# Patient Record
Sex: Male | Born: 1965
Health system: Southern US, Community
[De-identification: ages and names within clinical notes are randomized; demographics above are authoritative.]

## PROBLEM LIST (undated history)

## (undated) DIAGNOSIS — K0889 Other specified disorders of teeth and supporting structures: Secondary | ICD-10-CM

## (undated) DIAGNOSIS — M545 Low back pain, unspecified: Secondary | ICD-10-CM

## (undated) DIAGNOSIS — R911 Solitary pulmonary nodule: Secondary | ICD-10-CM

## (undated) DIAGNOSIS — J449 Chronic obstructive pulmonary disease, unspecified: Secondary | ICD-10-CM

## (undated) DIAGNOSIS — G4700 Insomnia, unspecified: Secondary | ICD-10-CM

## (undated) DIAGNOSIS — J189 Pneumonia, unspecified organism: Secondary | ICD-10-CM

## (undated) DIAGNOSIS — E7800 Pure hypercholesterolemia, unspecified: Secondary | ICD-10-CM

## (undated) DIAGNOSIS — R0602 Shortness of breath: Secondary | ICD-10-CM

## (undated) DIAGNOSIS — M549 Dorsalgia, unspecified: Secondary | ICD-10-CM

## (undated) DIAGNOSIS — R059 Cough, unspecified: Secondary | ICD-10-CM

## (undated) DIAGNOSIS — Z9889 Other specified postprocedural states: Secondary | ICD-10-CM

## (undated) DIAGNOSIS — K4090 Unilateral inguinal hernia, without obstruction or gangrene, not specified as recurrent: Secondary | ICD-10-CM

## (undated) DIAGNOSIS — F17200 Nicotine dependence, unspecified, uncomplicated: Secondary | ICD-10-CM

## (undated) DIAGNOSIS — E46 Unspecified protein-calorie malnutrition: Secondary | ICD-10-CM

## (undated) DIAGNOSIS — F419 Anxiety disorder, unspecified: Secondary | ICD-10-CM

## (undated) DIAGNOSIS — R05 Cough: Secondary | ICD-10-CM

## (undated) DIAGNOSIS — R0600 Dyspnea, unspecified: Secondary | ICD-10-CM

## (undated) DIAGNOSIS — K219 Gastro-esophageal reflux disease without esophagitis: Secondary | ICD-10-CM

## (undated) HISTORY — DX: Chronic obstructive pulmonary disease, unspecified: J44.9

## (undated) HISTORY — PX: CHOLECYSTECTOMY: SHX55

## (undated) HISTORY — PX: KNEE SURGERY: SHX244

## (undated) HISTORY — DX: Insomnia, unspecified: G47.00

## (undated) HISTORY — PX: APPENDECTOMY: SHX54

## (undated) HISTORY — DX: Other specified disorders of teeth and supporting structures: K08.89

## (undated) HISTORY — DX: Anxiety disorder, unspecified: F41.9

## (undated) HISTORY — PX: COLONOSCOPY: SHX174

## (undated) HISTORY — PX: HERNIA REPAIR: SHX51

## (undated) HISTORY — DX: Unspecified protein-calorie malnutrition: E46

## (undated) HISTORY — DX: Nicotine dependence, unspecified, uncomplicated: F17.200

## (undated) HISTORY — DX: Other specified postprocedural states: Z98.890

## (undated) HISTORY — PX: OTHER SURGICAL HISTORY: SHX169

## (undated) HISTORY — DX: Pure hypercholesterolemia, unspecified: E78.00

## (undated) HISTORY — DX: Unilateral inguinal hernia, without obstruction or gangrene, not specified as recurrent: K40.90

## (undated) HISTORY — DX: Low back pain, unspecified: M54.50

## (undated) HISTORY — DX: Shortness of breath: R06.02

## (undated) HISTORY — DX: Cough, unspecified: R05.9

---
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
## 2001-07-26 ENCOUNTER — Emergency Department (HOSPITAL_COMMUNITY): Admission: EM | Admit: 2001-07-26 | Discharge: 2001-07-26 | Payer: Self-pay | Admitting: Emergency Medicine

## 2003-01-29 ENCOUNTER — Emergency Department (HOSPITAL_COMMUNITY): Admission: EM | Admit: 2003-01-29 | Discharge: 2003-01-29 | Payer: Self-pay | Admitting: Emergency Medicine

## 2003-02-19 ENCOUNTER — Emergency Department (HOSPITAL_COMMUNITY): Admission: EM | Admit: 2003-02-19 | Discharge: 2003-02-19 | Payer: Self-pay | Admitting: *Deleted

## 2003-11-17 ENCOUNTER — Ambulatory Visit (HOSPITAL_COMMUNITY): Admission: RE | Admit: 2003-11-17 | Discharge: 2003-11-17 | Payer: Self-pay | Admitting: Internal Medicine

## 2003-12-23 ENCOUNTER — Observation Stay (HOSPITAL_COMMUNITY): Admission: RE | Admit: 2003-12-23 | Discharge: 2003-12-24 | Payer: Self-pay | Admitting: General Surgery

## 2004-12-14 ENCOUNTER — Emergency Department (HOSPITAL_COMMUNITY): Admission: EM | Admit: 2004-12-14 | Discharge: 2004-12-14 | Payer: Self-pay | Admitting: Emergency Medicine

## 2008-07-29 ENCOUNTER — Emergency Department (HOSPITAL_COMMUNITY): Admission: EM | Admit: 2008-07-29 | Discharge: 2008-07-29 | Payer: Self-pay | Admitting: Emergency Medicine

## 2009-01-14 ENCOUNTER — Emergency Department (HOSPITAL_COMMUNITY): Admission: EM | Admit: 2009-01-14 | Discharge: 2009-01-14 | Payer: Self-pay | Admitting: Emergency Medicine

## 2009-06-15 ENCOUNTER — Emergency Department (HOSPITAL_COMMUNITY): Admission: EM | Admit: 2009-06-15 | Discharge: 2009-06-15 | Payer: Self-pay | Admitting: Emergency Medicine

## 2010-04-08 DIAGNOSIS — Z9889 Other specified postprocedural states: Secondary | ICD-10-CM

## 2010-04-08 HISTORY — DX: Other specified postprocedural states: Z98.890

## 2010-04-21 ENCOUNTER — Encounter: Payer: Self-pay | Admitting: Internal Medicine

## 2010-04-21 ENCOUNTER — Ambulatory Visit (INDEPENDENT_AMBULATORY_CARE_PROVIDER_SITE_OTHER): Payer: Self-pay | Admitting: Gastroenterology

## 2010-04-21 ENCOUNTER — Encounter: Payer: Self-pay | Admitting: Gastroenterology

## 2010-04-21 DIAGNOSIS — Z1211 Encounter for screening for malignant neoplasm of colon: Secondary | ICD-10-CM

## 2010-04-21 DIAGNOSIS — K625 Hemorrhage of anus and rectum: Secondary | ICD-10-CM

## 2010-04-21 DIAGNOSIS — R1013 Epigastric pain: Secondary | ICD-10-CM

## 2010-04-21 HISTORY — DX: Hemorrhage of anus and rectum: K62.5

## 2010-04-22 ENCOUNTER — Encounter: Payer: Self-pay | Admitting: Gastroenterology

## 2010-04-26 ENCOUNTER — Telehealth (INDEPENDENT_AMBULATORY_CARE_PROVIDER_SITE_OTHER): Payer: Self-pay | Admitting: *Deleted

## 2010-04-26 ENCOUNTER — Other Ambulatory Visit: Payer: Self-pay | Admitting: Gastroenterology

## 2010-04-26 ENCOUNTER — Other Ambulatory Visit (HOSPITAL_COMMUNITY): Payer: Self-pay | Admitting: *Deleted

## 2010-04-26 DIAGNOSIS — R0602 Shortness of breath: Secondary | ICD-10-CM

## 2010-04-27 ENCOUNTER — Telehealth: Payer: Self-pay | Admitting: Gastroenterology

## 2010-04-27 NOTE — Letter (Signed)
Summary: TCS/EGD ORDER  TCS/EGD ORDER   Imported By: Ave Filter 04/21/2010 12:11:39  _____________________________________________________________________  External Attachment:    Type:   Image     Comment:   External Document

## 2010-04-27 NOTE — Telephone Encounter (Signed)
Message copied by Gerrit Halls on Tue Apr 27, 2010 11:23 AM ------      Message from: Ervin Knack      Created: Tue Apr 27, 2010 10:38 AM      Regarding: PATIENT WANTS DIFFERENT PAIN MED      Contact: [phone]       Pain medicine that he has been taking is not working(hydrocodone 5/500) wants different pain medication or stronger. He has already taken all of this medication and he cant tell any difference. Uses       [facility] [employer].

## 2010-04-27 NOTE — Telephone Encounter (Signed)
Pt given short course of vicodin at office visit April 21, 2010. He is scheduled for a procedure tomorrow. We will not be able to give any more narcotics. If he is having severe pain, N/V, abdominal pain, he will need to seek medical attention.

## 2010-04-27 NOTE — Assessment & Plan Note (Signed)
Summary: epigastric pain,positive H pylori   Vital Signs:  Patient profile:   45 year old male Height:      68 inches Weight:      152 pounds BMI:     23.20 Temp:     97.8 degrees F oral Pulse rate:   84 / minute BP sitting:   124 / 92  (left arm)  Vitals Entered By: Carolan Clines LPN (April 21, 2010 11:25 AM)  Visit Type:  Initial Consult [facility] Provider[facility]   History of Present Illness: Pt presents today in consult regarding several month hx of epigastric pain. + serum H.pylori. completed abx therapy. c/o sharp pains. No aggravating or alleviating factors. Constant. No nausea currently. Remote hx of vomiting. No dsyphagia/odynophagia. No reflux. +melena. Several months ago had blood in stool. Denies constipation. Has BM3-4X per day. Denies diarrhea. No loss of appetite. No wt loss.  Rarely takes Aleve. maybe one every few months. Denies BC, Goodys, Aspirin. +smoker, 1/2ppd.   Labs 03/22/10: Amylase/Lipase WNL                        LFTs WNL  Current Medications (verified): 1)  Advair Diskus 250-50 Mcg/dose Aepb (Fluticasone-Salmeterol) .... Two Times A Day 2)  Ventolin Hfa 108 (90 Base) Mcg/act Aers (Albuterol Sulfate) .... Two Times A Day 3)  Ranitidine Hcl 300 Mg Caps (Ranitidine Hcl) .... Once Daily 4)  Loratadine 10 Mg Tabs (Loratadine) .... Once Daily 5)  Pravastatin Sodium 20 Mg Tabs (Pravastatin Sodium) .... Once Daily 6)  Omeprazole 20 Mg Cpdr (Omeprazole) .... Two Times A Day  Allergies (verified): No Known Drug Allergies  Past History:  Past Medical History: Hypercholesterolemia COPD  Past Surgical History: Cholecystectomy Appendectomy Right inguinal hernia repair  Family History: Mother:living, non-contributory Father:deceased, ETOH cirrhosis Aunt: deceased stomach cancer No FH of Colon Cancer:  Social History: Current smoker: 1/2ppd Alcohol Use - yes, beers occasionally. denies daily use Illicit Drug  Use - no Drug Use:  no  Review of Systems General:  Denies fever, chills, and anorexia. Eyes:  Denies blurring, irritation, and discharge. ENT:  Denies sore throat, hoarseness, and difficulty swallowing. CV:  Denies chest pains and syncope. Resp:  Denies dyspnea at rest and wheezing. GI:  See HPI. GU:  Denies urinary burning and urinary frequency. MS:  Denies joint pain / LOM, joint swelling, and joint stiffness. Derm:  Denies rash, itching, and dry skin. Neuro:  Denies weakness and syncope. Psych:  Denies depression and anxiety. Endo:  Denies cold intolerance and heat intolerance.  Physical Exam  General:  Well developed, well nourished, no acute distress. Head:  Normocephalic and atraumatic. Eyes:  sclera without icterus Mouth:  No deformity or lesions, dentition normal. Lungs:  CTAB Heart:  Regular rate and rhythm; no murmurs, rubs,  or bruits. Abdomen:  soft, +BS, tender to palpation LUQ, epigastric area. no rebound or guarding. no HSM Msk:  Symmetrical with no gross deformities. Normal posture. Extremities:  No clubbing, cyanosis, edema or deformities noted. Neurologic:  Alert and  oriented x4;  grossly normal neurologically. Skin:  Intact without significant lesions or rashes. Psych:  Alert and cooperative. Normal mood and affect.   Impression & Recommendations:  Problem # 1:  ABDOMINAL PAIN-EPIGASTRIC (ICD-93.64)  45 year old male with several month hx of epigastric pain, positive serum H.pylori, s/p abx therapy. Continues to c/o sharp pains without any aggravating or alleviating factors. Constant. Denies nausea,  dysphagia, odynophagia. Denies reflux. Has noted melena. One incidence of brbpr. No loss of appetite or wt loss. Rare Aleve every few months, denies BC, Goodys. At time of interview, pt did not know current medications he was taking. Once retrieved from pharmacy, noted he was taking meloxicam. Will need to verify with pt if he is indeed still taking. Will need  EGD to assess chronic epigastric pain, likely biopsy for H.pylori. LFTs, Amylase, Lipase WNL. pt requested pain medication. very short course given, with instructions that if pain worsen in duration, severity, associated N/V, abdominal distension, to present to ED.   EGD/TCS with Dr. Jena Gauss in near future: the R/B/A have been discussed in detail. Pt states understanding and has no further questions. AVOID NSAIDs Prilosec 20 mg by mouth 30 minutes before breakfast daily. Samples given, rx sent to pharmacy  Orders: Consultation Level III [phone])  Problem # 2:  RECTAL BLEEDING (ICD-569.3)  Melanotic stools, likely r/t UGI source; however, did not incidence of brbpr. 3-4BMs per day. No diarrhea. Will proceed with colonoscopy at time of EGD. Likely hematochezia secondary to benign source.   TCS with Dr. Jena Gauss at time of EGD. See # 1.   Orders: Consultation Level III (42595) Prescriptions: VICODIN 5-500 MG TABS (HYDROCODONE-ACETAMINOPHEN) take 1 by mouth every 6 hours as needed pain  #20 x 0   Entered and Authorized by:   Gerrit Halls NP   Signed by:   Gerrit Halls NP on 04/21/2010   Method used:   Print then Give to Patient   RxID:   [id number]    Orders Added: 1)  Consultation Level III [16606]

## 2010-04-27 NOTE — Telephone Encounter (Signed)
Spoke with pts girlfriend and informed her of Anna's recommendations. She verbalized understanding.

## 2010-04-28 ENCOUNTER — Ambulatory Visit (HOSPITAL_COMMUNITY)
Admission: RE | Admit: 2010-04-28 | Discharge: 2010-04-28 | Disposition: A | Discharge status: Home / Self Care | Payer: Self-pay | Source: Ambulatory Visit | Attending: Internal Medicine | Admitting: Internal Medicine

## 2010-04-28 ENCOUNTER — Encounter: Payer: Self-pay | Admitting: Internal Medicine

## 2010-04-28 ENCOUNTER — Other Ambulatory Visit (HOSPITAL_COMMUNITY): Payer: Self-pay

## 2010-04-28 DIAGNOSIS — K625 Hemorrhage of anus and rectum: Secondary | ICD-10-CM

## 2010-04-28 DIAGNOSIS — J449 Chronic obstructive pulmonary disease, unspecified: Secondary | ICD-10-CM | POA: Insufficient documentation

## 2010-04-28 DIAGNOSIS — K449 Diaphragmatic hernia without obstruction or gangrene: Secondary | ICD-10-CM | POA: Insufficient documentation

## 2010-04-28 DIAGNOSIS — R1013 Epigastric pain: Secondary | ICD-10-CM | POA: Insufficient documentation

## 2010-04-28 DIAGNOSIS — K2100 Gastro-esophageal reflux disease with esophagitis, without bleeding: Secondary | ICD-10-CM | POA: Insufficient documentation

## 2010-04-28 DIAGNOSIS — J4489 Other specified chronic obstructive pulmonary disease: Secondary | ICD-10-CM | POA: Insufficient documentation

## 2010-04-28 DIAGNOSIS — R0602 Shortness of breath: Secondary | ICD-10-CM

## 2010-04-28 DIAGNOSIS — E785 Hyperlipidemia, unspecified: Secondary | ICD-10-CM | POA: Insufficient documentation

## 2010-04-28 LAB — CBC
HCT: 50.5 % (ref 39.0–52.0)
Hemoglobin: 16.7 g/dL (ref 13.0–17.0)
MCH: 30.2 pg (ref 26.0–34.0)
MCHC: 33.1 g/dL (ref 30.0–36.0)
MCV: 91.3 fL (ref 78.0–100.0)
Platelets: 279 10*3/uL (ref 150–400)
RBC: 5.53 MIL/uL (ref 4.22–5.81)
RDW: 14 % (ref 11.5–15.5)
WBC: 7.5 10*3/uL (ref 4.0–10.5)

## 2010-04-29 ENCOUNTER — Ambulatory Visit (HOSPITAL_COMMUNITY)
Admission: RE | Admit: 2010-04-29 | Discharge: 2010-04-29 | Disposition: A | Discharge status: Home / Self Care | Payer: Self-pay | Source: Ambulatory Visit | Attending: Neurology | Admitting: Neurology

## 2010-04-29 ENCOUNTER — Ambulatory Visit (HOSPITAL_COMMUNITY): Payer: Self-pay

## 2010-04-29 ENCOUNTER — Ambulatory Visit (HOSPITAL_COMMUNITY)
Admission: RE | Admit: 2010-04-29 | Discharge: 2010-04-29 | Disposition: A | Discharge status: Home / Self Care | Payer: Self-pay | Source: Ambulatory Visit | Attending: Gastroenterology | Admitting: Gastroenterology

## 2010-04-29 DIAGNOSIS — R1013 Epigastric pain: Secondary | ICD-10-CM | POA: Insufficient documentation

## 2010-04-29 LAB — BLOOD GAS, ARTERIAL
Acid-base deficit: 1.7 mmol/L (ref 0.0–2.0)
Bicarbonate: 23 mEq/L (ref 20.0–24.0)
FIO2: 0.21 %
O2 Saturation: 95.6 %
Patient temperature: 37
TCO2: 20.3 mmol/L (ref 0–100)
pCO2 arterial: 42.1 mmHg (ref 35.0–45.0)
pH, Arterial: 7.356 (ref 7.350–7.450)
pO2, Arterial: 73.4 mmHg — ABNORMAL LOW (ref 80.0–100.0)

## 2010-04-29 MED ORDER — IOHEXOL 300 MG/ML  SOLN
100.0000 mL | Freq: Once | INTRAMUSCULAR | Status: AC | PRN
  Administered 2010-04-29: 100 mL via INTRAVENOUS

## 2010-05-04 ENCOUNTER — Ambulatory Visit (HOSPITAL_COMMUNITY)
Admission: RE | Admit: 2010-05-04 | Discharge: 2010-05-04 | Disposition: A | Discharge status: Home / Self Care | Payer: Self-pay | Source: Ambulatory Visit | Attending: Neurology | Admitting: Neurology

## 2010-05-04 ENCOUNTER — Other Ambulatory Visit: Payer: Self-pay | Admitting: Gastroenterology

## 2010-05-04 DIAGNOSIS — R0989 Other specified symptoms and signs involving the circulatory and respiratory systems: Secondary | ICD-10-CM | POA: Insufficient documentation

## 2010-05-04 DIAGNOSIS — R0602 Shortness of breath: Secondary | ICD-10-CM

## 2010-05-04 DIAGNOSIS — R0609 Other forms of dyspnea: Secondary | ICD-10-CM | POA: Insufficient documentation

## 2010-05-04 NOTE — Op Note (Signed)
Cameron Villarreal, Cameron Villarreal                 ACCOUNT NO.:  [id number]  MEDICAL RECORD NO.:  [id number]           PATIENT TYPE:  O  LOCATION:  DAYP                          FACILITY:  [facility]  PHYSICIAN:  R. Roetta Sessions, M.D. DATE OF BIRTH:  04-Nov-1965  DATE OF PROCEDURE:  04/28/2010 DATE OF DISCHARGE:                              OPERATIVE REPORT   INDICATIONS FOR PROCEDURE:  A 45 year old gentleman with 1-month history of epigastric pain, positive H. pylori serologies, previously treated, regular NSAID use (meloxicam), 1 episode of painless blood per rectum, ongoing epigastric pain.  Amylase, lipase, LFTs came back normal.  He has had some transient dark stools as well.  EGD and colonoscopy are now being done.  Risks, benefits, limitations, alternatives, imponderables have been discussed, questions answered.  Please see the documentation for the medical record.  PROCEDURE NOTE:  O2 saturation, blood pressure, pulse, and respirations were monitored throughout the entirety of the procedure.  CONSCIOUS SEDATION:  Versed 5 mg IV, Demerol 125 mg IV in divided doses, Phenergan  12.5 mg slow IV push to augment conscious sedation. Cetacaine spray for topical pharyngeal anesthesia.  FINDINGS:  EGD:  Examination of the tubular esophagus revealed circumferential distal esophageal erosions within 5 mm of GE junction. There was no Barrett esophagus or other abnormalities.  EG Junction easily traversed. Stomach:  Gastric cavity was emptied and insufflated well with air. Thorough examination of the gastric mucosa including retroflexed proximal stomach esophagogastric junction demonstrated only a small hiatal hernia.  There was no ulcer infiltrating process or other abnormality.  Pylorus was patent, easily traversed.  Examination of the bulb second portion revealed no abnormalities.  THERAPEUTIC/DIAGNOSTIC MANEUVERS PERFORMED:  None.  The patient tolerated the procedure well and was prepared  for colonoscopy.  Digital rectal exam revealed no abnormalities.  Endoscopic findings:  Prep was adequate.  Colon:  Colonic mucosa was surveyed from the rectosigmoid junction through the left transverse right colon to the appendiceal orifice, ileocecal valve/cecum.  These structures were well seen and photographed for the record.  Terminal ileum was admitted to 10 cm.  From this level, scope was slowly and cautiously withdrawn.  All previously mentioned mucosal surfaces were again seen.  The colonic mucosa appeared normal as did terminal ileum mucosa.  The scope was pulled down into the rectum where thorough examination of the rectal mucosa including retroflexed view of the anal verge demonstrated no abnormalities.  The patient tolerated the procedure well.  Cecal withdrawal time 8 minutes.  IMPRESSION: 1. Circumferential distal esophageal erosions consistent with mild     erosive reflux esophagitis, small hiatal hernia, otherwise normal     stomach, D1 and D2.  Colonoscopy findings, normal rectum, colon,     and terminal ileum. 2. Cameron Villarreal symptoms are somewhat out of proportion to objective     findings and the workup thus far.  RECOMMENDATIONS: 1. Stop smoking. 2. Increase omeprazole to 20 mg orally twice daily. 3. Stop meloxicam and all nonsteroidals. 4. CBC today. 5. Proceed with a CT of the abdomen and pelvis with and without IV or     oral contrast  to further evaluate the epigastric pain in the     setting of negative EGD and laboratory evaluation thus far. 6. We have given prescription for Vicodin 5/500 mg, #20, 1 p.o. q.16     hours p.r.n. pain. 7. Further recommendations to follow.     Cameron Villarreal, M.D.     RMR/MEDQ  D:  04/28/2010  T:  04/28/2010  Job:  [id number]  cc:   [facility]  Electronically Signed by Lorrin Goodell M.D. on 05/04/2010 11:23:35 AM

## 2010-05-06 NOTE — Progress Notes (Signed)
  Phone Note Call from Patient   Caller: MICHELLE(GIRLFRIEND) Summary of Call: PAIN MEDICINE THAT HE HAS BEEN TAKING IS NOT WORKING(HYDROCODONE 5/500MG ) WANTS DIFFERENT PAIN MEDICATION OR STRONGER HE HAS ALREADY TAKEN ALL OF THIS MEDICATION AN HE CANT TELL ANY DIFFERENCE. USES [facility].  Initial call taken by: LEIGHANN     Appended Document:  PLease call pt & tell him to go to ER if severe pain Otherwise, AS can address tomorrow since she just saw him  Appended Document:  this was already taken care of by AS in [facility]

## 2010-05-11 LAB — DIFFERENTIAL
Basophils Absolute: 0.1 10*3/uL (ref 0.0–0.1)
Basophils Relative: 1 % (ref 0–1)
Eosinophils Absolute: 0.3 10*3/uL (ref 0.0–0.7)
Eosinophils Relative: 3 % (ref 0–5)
Lymphocytes Relative: 30 % (ref 12–46)
Lymphs Abs: 3.2 10*3/uL (ref 0.7–4.0)
Monocytes Absolute: 1 10*3/uL (ref 0.1–1.0)
Monocytes Relative: 10 % (ref 3–12)
Neutro Abs: 6 10*3/uL (ref 1.7–7.7)
Neutrophils Relative %: 57 % (ref 43–77)

## 2010-05-11 LAB — POCT CARDIAC MARKERS
CKMB, poc: <1 ng/mL — ABNORMAL LOW (ref 1.0–8.0)
Myoglobin, poc: 59.4 ng/mL (ref 12–200)
Troponin i, poc: <0.05 ng/mL (ref 0.00–0.09)

## 2010-05-11 LAB — CBC
HCT: 54.2 % — ABNORMAL HIGH (ref 39.0–52.0)
Hemoglobin: 17.8 g/dL — ABNORMAL HIGH (ref 13.0–17.0)
MCHC: 32.8 g/dL (ref 30.0–36.0)
MCV: 91.7 fL (ref 78.0–100.0)
Platelets: 311 10*3/uL (ref 150–400)
RBC: 5.91 MIL/uL — ABNORMAL HIGH (ref 4.22–5.81)
RDW: 14.1 % (ref 11.5–15.5)
WBC: 10.6 10*3/uL — ABNORMAL HIGH (ref 4.0–10.5)

## 2010-05-11 LAB — BASIC METABOLIC PANEL
BUN: 14 mg/dL (ref 6–23)
CO2: 28 mEq/L (ref 19–32)
Calcium: 9.7 mg/dL (ref 8.4–10.5)
Chloride: 100 mEq/L (ref 96–112)
Creatinine, Ser: 0.93 mg/dL (ref 0.4–1.5)
GFR calc Af Amer: >60 mL/min (ref >60)
GFR calc non Af Amer: >60 mL/min (ref >60)
Glucose, Bld: 110 mg/dL — ABNORMAL HIGH (ref 70–99)
Potassium: 3.9 mEq/L (ref 3.5–5.1)
Sodium: 135 mEq/L (ref 135–145)

## 2010-06-15 ENCOUNTER — Encounter: Payer: Self-pay | Admitting: Internal Medicine

## 2010-06-15 NOTE — Progress Notes (Signed)
Pt with defect in diaphram and fatty liver - nothing else( I note a recent chest CT ordered elsewhere ) indicateing a small chest nodule for which f/u ct recommneded (per ordering provider).  Please let pt know abd ct ok; how is epigastric pain now on  BID PPI; ofer f/u appt if needed

## 2010-06-16 NOTE — Progress Notes (Signed)
Pt is aware of OV for 06/17/10 @ 0930 with AS

## 2010-06-16 NOTE — Progress Notes (Signed)
Tried to call pt- LM with male for return call 

## 2010-06-16 NOTE — Progress Notes (Signed)
Pt aware, stated he was still having pain. Please schedule ov.

## 2010-06-17 ENCOUNTER — Ambulatory Visit (INDEPENDENT_AMBULATORY_CARE_PROVIDER_SITE_OTHER): Payer: Self-pay | Admitting: Gastroenterology

## 2010-06-17 ENCOUNTER — Encounter: Payer: Self-pay | Admitting: Gastroenterology

## 2010-06-17 DIAGNOSIS — R1012 Left upper quadrant pain: Secondary | ICD-10-CM

## 2010-06-17 DIAGNOSIS — R1013 Epigastric pain: Secondary | ICD-10-CM

## 2010-06-17 MED ORDER — DEXLANSOPRAZOLE 60 MG PO CPDR: 60.0000 mg | DELAYED_RELEASE_CAPSULE | Freq: Every day | ORAL | Status: AC

## 2010-06-17 NOTE — Progress Notes (Signed)
No PCP on record 

## 2010-06-17 NOTE — Patient Instructions (Signed)
Stop Omeprazole. Do not begin Dexilant until you have gotten the stool sample collected (the medicine can interfere with the results).  Obtain stool sample.  Begin Dexilant 60 mg daily. Take it at the same time every day.   We will see you back in 3 months.

## 2010-06-17 NOTE — Progress Notes (Signed)
   Referring Provider: No ref. provider found Primary Care Physician:  No primary provider on file.  Chief Complaint  Patient presents with  . Abdominal Pain    HPI:   Cameron Villarreal returns today in f/u for abdominal pain. He completed an EGD and colonoscopy in March 2012. Findings of reflux esophagitis, normal colonoscopy. Continues to complain of LUQ pain, always underlying. "stabbing". Waxes and wanes. Not aggravated or precipitated by eating/drinking. Occasional nausea. No improvement despite BID Prilosec. Complains of pain 8/10 at its worst.   Up 2 lbs from last visit.   Labs have been normal. No lipase, LFT abnormalities. CT abdomen performed in interim with right diaphragmatic defect. Nothing to explain abdominal pain.  Does have hx of + serum H.pylori, has been treated in past.   Pt is requesting narcotics.    Past Medical History  Diagnosis Date  . COPD (chronic obstructive pulmonary disease)   . Hypercholesterolemia   . S/P endoscopy March 2012    reflux esophagitis, no ulcerations  . S/P colonoscopy March 2012    normal    Past Surgical History  Procedure Date  . Cholecystectomy   . Appendectomy   . Right inguinal hernia repair     Current Outpatient Prescriptions  Medication Sig Dispense Refill  . omeprazole (PRILOSEC) 20 MG capsule Take 20 mg by mouth daily.                Allergies as of 06/17/2010  . (No Known Allergies)    Family History  Problem Relation Age of Onset  . Cirrhosis Father     deceased, ETOH cirrhosis  . Stomach cancer      aunt    History   Social History  . Marital Status: Single    Spouse Name: N/A    Number of Children: N/A  . Years of Education: N/A   Social History Main Topics  . Smoking status: Current Everyday Smoker -- 0.5 packs/day    Types: Cigarettes  . Smokeless tobacco: None  . Alcohol Use: 0.5 oz/week    1 drink(s) per week  . Drug Use: No    Review of Systems: Gen: Denies fever, chills, anorexia.  Denies fatigue, weakness, weight loss.  CV: Denies chest pain, palpitations, syncope, peripheral edema, and claudication. Resp: Denies dyspnea at rest, cough, wheezing, coughing up blood, and pleurisy. GI: Denies vomiting blood, jaundice, and fecal incontinence.   Denies dysphagia or odynophagia. Derm: Denies rash, itching, dry skin Psych: Denies depression, anxiety, memory loss, confusion. No homicidal or suicidal ideation.  Heme: Denies bruising, bleeding, and enlarged lymph nodes.  Physical Exam: BP 118/74  Pulse 80  Temp(Src) 98.1 F (36.7 C) (Tympanic)  Ht 5\' 8"  (1.727 m)  Wt 154 lb (69.854 kg)  BMI 23.42 kg/m2 General:   Alert and oriented. No distress noted. Pleasant and cooperative.  Head:  Normocephalic and atraumatic. Eyes:  Conjuctiva clear without scleral icterus. Mouth:  Oral mucosa pink and moist. Good dentition. No lesions. Heart:  S1, S2 present without murmurs, rubs, or gallops. Regular rate and rhythm. Abdomen:  +BS, soft, non-tender and non-distended. No rebound or guarding. No HSM or masses noted. Msk:  Symmetrical without gross deformities. Normal posture. Extremities:  Without edema. Neurologic:  Alert and  oriented x4;  grossly normal neurologically. Skin:  Intact without significant lesions or rashes. Psych:  Alert and cooperative. Normal mood and affect.

## 2010-06-17 NOTE — Assessment & Plan Note (Signed)
Continued LUQ abdominal pain despite thus far negative work-up. Pain seems to be disproportionate to findings. Pt is requesting narcotics. No improvement with BID Prilosec. Pt does have hx of +H.pylori serology. Will obtain stool sample for completeness, start Dexilant, and return in 3 mos.  Stop Prilosec. Obtain stool sample for H.pylori Start Dexilant after stool sample completed Return in 3 months NO narcotics

## 2010-06-25 NOTE — Op Note (Signed)
Cameron Villarreal, Cameron Villarreal                 ACCOUNT NO.:  [id number]   MEDICAL RECORD NO.:  [id number]          PATIENT TYPE:  AMB   LOCATION:  DAY                           FACILITY:  [facility]   PHYSICIAN:  Leroy C. Katrinka Blazing, M.D.   DATE OF BIRTH:  [birth year]   DATE OF PROCEDURE:  12/23/2003  DATE OF DISCHARGE:                                 OPERATIVE REPORT   PREOPERATIVE DIAGNOSIS:  Right inguinal hernia.   POSTOPERATIVE DIAGNOSIS:  Right inguinal hernia.   PROCEDURE:  Right inguinal hernia repair.   SURGEON:  Dirk Dress. Katrinka Blazing, M.D.   DESCRIPTION:  Under general anesthesia, the patient's abdomen was prepped  and draped in a sterile field.  A lower curvilinear incision was made above  the inguinal crease.  There was previous operation in an area running  parallel to this incision about 4-5 cm superior to the incision.  Upon  entering the deep subcutaneous tissue, it was apparent that this previous  surgery involved the more superior and medial aspect of the aponeurotic  fascia.  The residual aponeurosis was opened and the cord was identified.  Cord was separated from the floor.  There was a large defect of the inguinal  floor through the transversalis with a very large direct hernia.  This was  dissected free and once the hernia was dissected free, there was minimal  residual transversalis.  A large plug was chosen and was sutured to the  residual surrounding transversalis fascia.  The cord was further mobilized  and an onlay patch was placed and was sutured to the, starting at the pubic  tubercle, going down Cooper's ligament, with a transition stitch and  extending along the iliopubic tract.  The superior margin was sutured to  residual conjoined tendon, though the portion of the conjoined tendon that  was formed by the anterior rectus fascia was not in place.  The superiormost  aspect of the aponeurosis of the external oblique was used in a portion of  this repair.  This was extremely  severely attenuated.  Once this was done,  it was felt that the cord was adequately placed but there was not enough  transversalis to cover the cord totally.  The transversalis, the inferior  margin of the aponeurosis was actually sutured to the subcutaneous fascia,  which was poorly-defined.  It was accepted that this would leave a portion  of the cord exposed in the subcutaneous tissue.  It was elected not to close  this tightly so as not to constrict the cord.  The subcutaneous tissue was  closed with interrupted 3-0 Biosyn and the skin was closed with subcuticular  4-0 Vicryl.  The patient tolerated the procedure well.  The incision was infiltrated with  22 mL of 0.5% Marcaine with epinephrine.  A dressing was placed.  The  patient tolerated the procedure well.  He was awakened from anesthesia,  transferred to a bed, and taken to the postanesthetic care unit for further  monitoring.     Lero   LCS/MEDQ  D:  12/23/2003  T:  12/23/2003  Job:  [id number]   cc:   Kirk Ruths, M.D.  [id number]  [place]  [place] [address]  Fax: [phone]

## 2010-06-25 NOTE — H&P (Signed)
NAMELORAINE, Cameron Villarreal                 ACCOUNT NO.:  [id number]   MEDICAL RECORD NO.:  [id number]          PATIENT TYPE:  AMB   LOCATION:  DAY                           FACILITY:  [facility]   PHYSICIAN:  Leroy C. Katrinka Blazing, M.D.   DATE OF BIRTH:  21-May-1965   DATE OF ADMISSION:  DATE OF DISCHARGE:  LH                                HISTORY & PHYSICAL   A 45 year old male with a history of swelling in his right lower quadrant.  The swelling started in September when he picked up some wire at work.  He  works as an Personnel officer, or Financial controller at [employer].  He  noticed over the past month or so that the area had become more painful and  is getting much larger.  He was seen in the office in consultation and was  noted to have a very symptomatic large right inguinal hernia.  Because of  increasing symptoms, it was recommended that the patient proceed with hernia  repair.   PAST HISTORY:  He has a severe lumbar disk disease.   SURGERY:  Includes cholecystectomy in 2001 and right knee surgery in 1997.   ALLERGIES:  NONE.   MEDICATIONS:  Tylox p.r.n.   SOCIAL HISTORY:  He is single.  He has one son.  He is employed.  He smokes  1-1/2 packs of cigarettes per day.  He drinks about two beers per day.   PHYSICAL EXAMINATION:  He is a healthy-appearing male.  Blood pressure  110/70, pulse 72, respirations 18, weight 163 pounds.  HEENT:  Unremarkable except for poor dentition.  NECK:  Supple without JVD or bruit.  CHEST:  Clear to auscultation.  HEART:  Regular rate and rhythm without murmur, gallop or rub.  ABDOMEN:  Soft, nontender without masses.  Examination of the hernia area  revealed a large right inguinal hernia with a possible early left inguinal  bulge without true herniation.  There is no umbilical hernia.  EXTREMITIES:  No cyanosis, clubbing or edema.  NEUROLOGIC:  No focal motor, sensory or cerebellar deficit.   IMPRESSION:  1.  Symptomatic right inguinal hernia.  2.   History of lumbar disk disease with low back pain.   PLAN:  Surgical repair or right inguinal hernia.     Lero   LCS/MEDQ  D:  12/22/2003  T:  12/23/2003  Job:  [id number]

## 2010-07-06 ENCOUNTER — Encounter: Payer: Self-pay | Admitting: Gastroenterology

## 2010-07-06 NOTE — Progress Notes (Unsigned)
  Can we get PR on pt. Never submitted H.pylori stool antigen test. Needs completed, but if he is doing better on Dexilant, not necessary.  However, if still having LUQ discomfort, please have him hold PPI for 2 weeks, obtain stool sample and submit, then resume Dexilant. May have OV if needed.

## 2010-07-09 NOTE — Progress Notes (Signed)
Tried to call pt- NA 

## 2010-07-13 NOTE — Progress Notes (Signed)
Tried to call pt- per girlfriend he was still asleep and she will have him call me.

## 2010-07-15 NOTE — Progress Notes (Signed)
Unable to reach pt. Mailed letter. 

## 2010-09-15 ENCOUNTER — Encounter: Payer: Self-pay | Admitting: Urgent Care

## 2010-09-16 ENCOUNTER — Ambulatory Visit: Payer: Self-pay | Admitting: Gastroenterology

## 2010-09-16 ENCOUNTER — Ambulatory Visit: Payer: Self-pay | Admitting: Urgent Care

## 2010-09-16 ENCOUNTER — Telehealth: Payer: Self-pay | Admitting: Urgent Care

## 2010-09-16 NOTE — Telephone Encounter (Signed)
Noted  

## 2011-06-15 ENCOUNTER — Other Ambulatory Visit: Payer: Self-pay | Admitting: Obstetrics and Gynecology

## 2011-06-15 DIAGNOSIS — R911 Solitary pulmonary nodule: Secondary | ICD-10-CM

## 2011-06-17 ENCOUNTER — Other Ambulatory Visit (HOSPITAL_COMMUNITY): Payer: Self-pay

## 2011-06-20 ENCOUNTER — Other Ambulatory Visit (HOSPITAL_COMMUNITY): Payer: Self-pay | Admitting: Physician Assistant

## 2011-06-20 DIAGNOSIS — R911 Solitary pulmonary nodule: Secondary | ICD-10-CM

## 2011-06-21 ENCOUNTER — Encounter (HOSPITAL_COMMUNITY): Payer: Self-pay

## 2011-06-21 ENCOUNTER — Ambulatory Visit (HOSPITAL_COMMUNITY)
Admission: RE | Admit: 2011-06-21 | Discharge: 2011-06-21 | Disposition: A | Discharge status: Home / Self Care | Payer: Self-pay | Source: Ambulatory Visit | Attending: Obstetrics and Gynecology | Admitting: Obstetrics and Gynecology

## 2011-06-21 DIAGNOSIS — R911 Solitary pulmonary nodule: Secondary | ICD-10-CM | POA: Insufficient documentation

## 2011-06-21 DIAGNOSIS — J438 Other emphysema: Secondary | ICD-10-CM | POA: Insufficient documentation

## 2011-07-02 ENCOUNTER — Encounter (HOSPITAL_COMMUNITY): Payer: Self-pay | Admitting: *Deleted

## 2011-07-02 ENCOUNTER — Emergency Department (HOSPITAL_COMMUNITY): Payer: Self-pay

## 2011-07-02 ENCOUNTER — Inpatient Hospital Stay (HOSPITAL_COMMUNITY)
Admission: EM | Admit: 2011-07-02 | Discharge: 2011-07-04 | DRG: 190 | Disposition: A | Discharge status: Home / Self Care | Payer: Self-pay | Attending: Internal Medicine | Admitting: Internal Medicine

## 2011-07-02 DIAGNOSIS — J441 Chronic obstructive pulmonary disease with (acute) exacerbation: Secondary | ICD-10-CM

## 2011-07-02 DIAGNOSIS — J159 Unspecified bacterial pneumonia: Secondary | ICD-10-CM

## 2011-07-02 DIAGNOSIS — R1013 Epigastric pain: Secondary | ICD-10-CM

## 2011-07-02 DIAGNOSIS — F17200 Nicotine dependence, unspecified, uncomplicated: Secondary | ICD-10-CM

## 2011-07-02 DIAGNOSIS — J9600 Acute respiratory failure, unspecified whether with hypoxia or hypercapnia: Secondary | ICD-10-CM | POA: Diagnosis present

## 2011-07-02 DIAGNOSIS — R739 Hyperglycemia, unspecified: Secondary | ICD-10-CM

## 2011-07-02 DIAGNOSIS — R0902 Hypoxemia: Secondary | ICD-10-CM

## 2011-07-02 DIAGNOSIS — R1012 Left upper quadrant pain: Secondary | ICD-10-CM

## 2011-07-02 DIAGNOSIS — R911 Solitary pulmonary nodule: Secondary | ICD-10-CM | POA: Diagnosis present

## 2011-07-02 DIAGNOSIS — E7800 Pure hypercholesterolemia, unspecified: Secondary | ICD-10-CM | POA: Diagnosis present

## 2011-07-02 DIAGNOSIS — J40 Bronchitis, not specified as acute or chronic: Secondary | ICD-10-CM

## 2011-07-02 DIAGNOSIS — Z720 Tobacco use: Secondary | ICD-10-CM | POA: Diagnosis present

## 2011-07-02 DIAGNOSIS — R7309 Other abnormal glucose: Secondary | ICD-10-CM | POA: Diagnosis present

## 2011-07-02 DIAGNOSIS — K625 Hemorrhage of anus and rectum: Secondary | ICD-10-CM

## 2011-07-02 DIAGNOSIS — D72829 Elevated white blood cell count, unspecified: Secondary | ICD-10-CM | POA: Diagnosis present

## 2011-07-02 DIAGNOSIS — T50905A Adverse effect of unspecified drugs, medicaments and biological substances, initial encounter: Secondary | ICD-10-CM | POA: Diagnosis not present

## 2011-07-02 DIAGNOSIS — E871 Hypo-osmolality and hyponatremia: Secondary | ICD-10-CM | POA: Diagnosis present

## 2011-07-02 DIAGNOSIS — T380X5A Adverse effect of glucocorticoids and synthetic analogues, initial encounter: Secondary | ICD-10-CM | POA: Diagnosis present

## 2011-07-02 DIAGNOSIS — J189 Pneumonia, unspecified organism: Secondary | ICD-10-CM | POA: Diagnosis present

## 2011-07-02 HISTORY — DX: Solitary pulmonary nodule: R91.1

## 2011-07-02 LAB — COMPREHENSIVE METABOLIC PANEL
ALT: 15 U/L (ref 0–53)
AST: 19 U/L (ref 0–37)
Albumin: 3.8 g/dL (ref 3.5–5.2)
Alkaline Phosphatase: 117 U/L (ref 39–117)
CO2: 27 mEq/L (ref 19–32)
Chloride: 94 mEq/L — ABNORMAL LOW (ref 96–112)
Creatinine, Ser: 0.59 mg/dL (ref 0.50–1.35)
GFR calc non Af Amer: >90 mL/min (ref >90)
Potassium: 3.7 mEq/L (ref 3.5–5.1)
Total Bilirubin: 0.5 mg/dL (ref 0.3–1.2)

## 2011-07-02 LAB — DIFFERENTIAL
Basophils Absolute: 0 10*3/uL (ref 0.0–0.1)
Basophils Relative: 0 % (ref 0–1)
Eosinophils Absolute: 0 10*3/uL (ref 0.0–0.7)
Metamyelocytes Relative: 0 %
Myelocytes: 0 %
Neutro Abs: 15.5 10*3/uL — ABNORMAL HIGH (ref 1.7–7.7)
Neutrophils Relative %: 76 % (ref 43–77)
Promyelocytes Absolute: 0 %

## 2011-07-02 LAB — CBC
Hemoglobin: 14.7 g/dL (ref 13.0–17.0)
MCH: 30.2 pg (ref 26.0–34.0)
MCHC: 33.2 g/dL (ref 30.0–36.0)
MCV: 91 fL (ref 78.0–100.0)
Platelets: 246 10*3/uL (ref 150–400)

## 2011-07-02 LAB — POCT I-STAT TROPONIN I

## 2011-07-02 MED ORDER — IPRATROPIUM BROMIDE 0.02 % IN SOLN: 0.5000 mg | RESPIRATORY_TRACT | Status: DC

## 2011-07-02 MED ORDER — IPRATROPIUM BROMIDE 0.02 % IN SOLN
0.5000 mg | Freq: Once | RESPIRATORY_TRACT | Status: AC
  Administered 2011-07-02: 0.5 mg via RESPIRATORY_TRACT
  Filled 2011-07-02: qty 2.5

## 2011-07-02 MED ORDER — SODIUM CHLORIDE 0.9 % IV SOLN: INTRAVENOUS | Status: DC

## 2011-07-02 MED ORDER — TRAMADOL HCL 50 MG PO TABS
100.0000 mg | ORAL_TABLET | Freq: Once | ORAL | Status: AC
  Administered 2011-07-02: 100 mg via ORAL
  Filled 2011-07-02: qty 2

## 2011-07-02 MED ORDER — DEXTROSE 5 % IV SOLN
500.0000 mg | Freq: Once | INTRAVENOUS | Status: AC
  Administered 2011-07-02: 500 mg via INTRAVENOUS
  Filled 2011-07-02: qty 500

## 2011-07-02 MED ORDER — ALBUTEROL SULFATE (5 MG/ML) 0.5% IN NEBU
5.0000 mg | INHALATION_SOLUTION | Freq: Once | RESPIRATORY_TRACT | Status: AC
  Administered 2011-07-02: 5 mg via RESPIRATORY_TRACT
  Filled 2011-07-02: qty 1

## 2011-07-02 MED ORDER — ALBUTEROL SULFATE (5 MG/ML) 0.5% IN NEBU: 5.0000 mg | INHALATION_SOLUTION | RESPIRATORY_TRACT | Status: DC

## 2011-07-02 MED ORDER — DEXTROSE 5 % IV SOLN
1.0000 g | Freq: Once | INTRAVENOUS | Status: AC
  Administered 2011-07-02: 1 g via INTRAVENOUS
  Filled 2011-07-02: qty 10

## 2011-07-02 MED ORDER — METHOCARBAMOL 500 MG PO TABS
1000.0000 mg | ORAL_TABLET | Freq: Three times a day (TID) | ORAL | Status: DC
  Administered 2011-07-02: 1000 mg via ORAL
  Filled 2011-07-02: qty 2

## 2011-07-02 MED ORDER — SODIUM CHLORIDE 0.9 % IV BOLUS (SEPSIS)
1000.0000 mL | Freq: Once | INTRAVENOUS | Status: AC
  Administered 2011-07-02: 1000 mL via INTRAVENOUS

## 2011-07-02 MED ORDER — ALBUTEROL SULFATE (5 MG/ML) 0.5% IN NEBU
INHALATION_SOLUTION | RESPIRATORY_TRACT | Status: AC
  Administered 2011-07-02: 5 mg
  Filled 2011-07-02: qty 1

## 2011-07-02 MED ORDER — METHYLPREDNISOLONE SODIUM SUCC 125 MG IJ SOLR
125.0000 mg | Freq: Once | INTRAMUSCULAR | Status: AC
  Administered 2011-07-02: 125 mg via INTRAVENOUS
  Filled 2011-07-02: qty 2

## 2011-07-02 MED ORDER — IPRATROPIUM BROMIDE 0.02 % IN SOLN
RESPIRATORY_TRACT | Status: AC
  Administered 2011-07-02: 0.5 mg
  Filled 2011-07-02: qty 2.5
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
## 2011-07-02 NOTE — ED Notes (Signed)
Pt ambulated twice around nurses station. Pulse ox dropped to 82%.
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
## 2011-07-02 NOTE — H&P (Signed)
Chief Complaint:  Shortness of breath cough and fever for several days  HPI: 46 year old male with a history of COPD tobacco abuse comes in with several days of worsening shortness of breath cough and fever with wheezing. Found to be very hypoxic in the emergency department which was worse with ambulation. He states he feels much better after receiving Solu-Medrol and multiple nebulizer treatments in the ED and we were asked to admit him because of hypoxemia with ambulation. He denies any nausea vomiting or diarrhea. He denies any chest pain or abdominal pain.  Review of Systems:  Otherwise negative  Past Medical History: Past Medical History  Diagnosis Date  . COPD (chronic obstructive pulmonary disease)   . Hypercholesterolemia   . S/P endoscopy March 2012    reflux esophagitis, no ulcerations  . S/P colonoscopy March 2012    normal   Past Surgical History  Procedure Date  . Cholecystectomy   . Appendectomy   . Right inguinal hernia repair     Medications: Prior to Admission medications   Medication Sig Start Date End Date Taking? Authorizing Provider  albuterol (PROVENTIL HFA;VENTOLIN HFA) 108 (90 BASE) MCG/ACT inhaler Inhale 2 puffs into the lungs every 6 (six) hours as needed. Shortness of breath   Yes Historical Provider, MD  Fluticasone-Salmeterol (ADVAIR DISKUS) 250-50 MCG/DOSE AEPB Inhale 1 puff into the lungs every 12 (twelve) hours.     Yes Historical Provider, MD  HYDROcodone-acetaminophen (VICODIN) 5-500 MG per tablet Take 1 tablet by mouth every 6 (six) hours as needed. pain   Yes Historical Provider, MD  loratadine (CLARITIN) 10 MG tablet Take 10 mg by mouth daily.     Yes Historical Provider, MD  pravastatin (PRAVACHOL) 20 MG tablet Take 20 mg by mouth daily.      Historical Provider, MD    Allergies:  No Known Allergies  Social History:  reports that he has been smoking Cigarettes.  He has been smoking about .5 packs per day. He does not have any smokeless  tobacco history on file. He reports that he drinks about .5 ounces of alcohol per week. He reports that he does not use illicit drugs.  Family History: Family History  Problem Relation Age of Onset  . Cirrhosis Father     deceased, ETOH cirrhosis  . Stomach cancer      aunt    Physical Exam: Filed Vitals:   07/02/11 1721 07/02/11 1746 07/02/11 1911 07/02/11 2010  BP:  117/82  112/67  Pulse:  118  120  Temp:  99.4 F (37.4 C)    TempSrc:  Oral    Resp:  22  18  Height:      Weight:      SpO2: 96% 97% 97% 93%   BP 123/79  Pulse 91  Temp(Src) 97.5 F (36.4 C) (Oral)  Resp 20  Ht 5\' 8"  (1.727 m)  Wt 65.3 kg (143 lb 15.4 oz)  BMI 21.89 kg/m2  SpO2 95% General appearance: alert, cooperative and no distress Lungs: diminished breath sounds bibasilar Heart: regular rate and rhythm, S1, S2 normal, no murmur, click, rub or gallop Abdomen: soft, non-tender; bowel sounds normal; no masses,  no organomegaly Extremities: extremities normal, atraumatic, no cyanosis or edema Pulses: 2+ and symmetric Skin: Skin color, texture, turgor normal. No rashes or lesions Neurologic: Grossly normal    Labs on Admission:   [facility] 07/02/11 1740  NA 132*  K 3.7  CL 94*  CO2 27  GLUCOSE 139*  BUN 9  CREATININE 0.59  CALCIUM 9.9  MG --  PHOS --    Basename 07/02/11 1740  AST 19  ALT 15  ALKPHOS 117  BILITOT 0.5  PROT 8.2  ALBUMIN 3.8    Basename 07/02/11 1740  WBC 20.4*  NEUTROABS 15.5*  HGB 14.7  HCT 44.3  MCV 91.0  PLT 246    Radiological Exams on Admission: Dg Chest 2 View  07/02/2011  *RADIOLOGY REPORT*  Clinical Data: Cough and fever.  Shortness of breath.  CHEST - 2 VIEW  Comparison: 01/14/2009  Findings: Severe pulmonary emphysema again demonstrated.  No evidence of acute infiltrate or pleural effusion.  Heart size is normal.  No mass or lymphadenopathy identified.  IMPRESSION: Severe pulmonary emphysema.  No active disease.  Original Report Authenticated By:  Danae Orleans, M.D.   Ct Chest Wo Contrast  06/21/2011  *RADIOLOGY REPORT*  Clinical Data: Follow-up lung nodule.  Prior exam 04/29/2010. Annual follow-up for 4 mm pulmonary nodule.  CT CHEST WITHOUT CONTRAST  Technique:  Multidetector CT imaging of the chest was performed following the standard protocol without IV contrast.  Comparison: 04/29/2010.  Findings: Severe pulmonary emphysema is present.  The left upper lobe pulmonary nodule is unchanged, continuing to measure 4 mm (image 26 series 3).  Scattered other smaller peripheral pulmonary nodules are present.  There is no mediastinal, hilar, or axillary lymphadenopathy.  There is no axillary, mediastinal, or hilar adenopathy.  Thoracic spondylosis is mild, with multilevel Schmorl's nodes.  Incidental imaging the upper abdomen is within normal limits.  Mild abdominal aortic atherosclerosis.  Cholecystectomy.  Coronary artery atherosclerosis is present. If office based assessment of coronary risk factors has not been performed, it is now recommended. Unchanged Bochdalek hernia.  IMPRESSION:  1.  Stable 4 mm left upper lobe pulmonary nodule.  No further follow-up required. 2.  Severe emphysema.  Original Report Authenticated By: Andreas Newport, M.D.    Assessment/Plan Present on Admission:  46 year old male with acute hypoxic respiratory failure, COPD exacerbation, and likely community-acquired pneumonia  .Acute respiratory failure .CAP (community acquired pneumonia) .COPD exacerbation .Tobacco abuse  By mouth steroids. Azithromycin and Rocephin. Frequent nebulizers. Oxygen supplementation as needed. Quit smoking.   Marten Iles [phone] 07/02/2011, 10:22 PM

## 2011-07-02 NOTE — ED Notes (Signed)
Patient states he does not need anything at this time. 

## 2011-07-02 NOTE — ED Notes (Signed)
Pt placed on O2 at 2L/min via [place] 

## 2011-07-02 NOTE — ED Notes (Signed)
Istat Trop completed at 1714.

## 2011-07-02 NOTE — ED Notes (Signed)
Ambulated Patient around the nurses desk three times. Patients sats dropped to 80%. Made nurse aware.

## 2011-07-02 NOTE — ED Notes (Signed)
Report given to floor. Pt ready for transport. 

## 2011-07-03 ENCOUNTER — Inpatient Hospital Stay (HOSPITAL_COMMUNITY): Payer: Self-pay

## 2011-07-03 DIAGNOSIS — J159 Unspecified bacterial pneumonia: Secondary | ICD-10-CM

## 2011-07-03 DIAGNOSIS — F17200 Nicotine dependence, unspecified, uncomplicated: Secondary | ICD-10-CM

## 2011-07-03 DIAGNOSIS — J441 Chronic obstructive pulmonary disease with (acute) exacerbation: Secondary | ICD-10-CM

## 2011-07-03 DIAGNOSIS — J9600 Acute respiratory failure, unspecified whether with hypoxia or hypercapnia: Secondary | ICD-10-CM

## 2011-07-03 LAB — CBC
Hemoglobin: 13.9 g/dL (ref 13.0–17.0)
MCH: 29.3 pg (ref 26.0–34.0)
MCV: 90.7 fL (ref 78.0–100.0)
RBC: 4.74 MIL/uL (ref 4.22–5.81)

## 2011-07-03 LAB — BASIC METABOLIC PANEL
CO2: 28 mEq/L (ref 19–32)
Glucose, Bld: 166 mg/dL — ABNORMAL HIGH (ref 70–99)
Potassium: 4.3 mEq/L (ref 3.5–5.1)
Sodium: 134 mEq/L — ABNORMAL LOW (ref 135–145)

## 2011-07-03 LAB — GLUCOSE, CAPILLARY: Glucose-Capillary: 131 mg/dL — ABNORMAL HIGH (ref 70–99)

## 2011-07-03 MED ORDER — SODIUM CHLORIDE 0.9 % IV SOLN: 250.0000 mL | INTRAVENOUS | Status: DC | PRN

## 2011-07-03 MED ORDER — ALBUTEROL SULFATE (5 MG/ML) 0.5% IN NEBU: 2.5000 mg | INHALATION_SOLUTION | RESPIRATORY_TRACT | Status: DC | PRN

## 2011-07-03 MED ORDER — PREDNISONE 20 MG PO TABS: 30.0000 mg | ORAL_TABLET | Freq: Every day | ORAL | Status: DC

## 2011-07-03 MED ORDER — ALBUTEROL SULFATE (5 MG/ML) 0.5% IN NEBU
2.5000 mg | INHALATION_SOLUTION | Freq: Four times a day (QID) | RESPIRATORY_TRACT | Status: DC
  Administered 2011-07-03 – 2011-07-04 (×6): 2.5 mg via RESPIRATORY_TRACT
  Filled 2011-07-03 (×6): qty 0.5

## 2011-07-03 MED ORDER — NICOTINE 21 MG/24HR TD PT24
21.0000 mg | MEDICATED_PATCH | Freq: Every day | TRANSDERMAL | Status: DC
  Administered 2011-07-04: 21 mg via TRANSDERMAL
  Filled 2011-07-03: qty 1

## 2011-07-03 MED ORDER — DEXTROSE 5 % IV SOLN
250.0000 mg | INTRAVENOUS | Status: DC
  Filled 2011-07-03 (×4): qty 250

## 2011-07-03 MED ORDER — HYDROCODONE-ACETAMINOPHEN 5-325 MG PO TABS
1.0000 | ORAL_TABLET | Freq: Four times a day (QID) | ORAL | Status: DC | PRN
  Administered 2011-07-03 – 2011-07-04 (×4): 1 via ORAL
  Filled 2011-07-03 (×4): qty 1

## 2011-07-03 MED ORDER — SODIUM CHLORIDE 0.9 % IJ SOLN
INTRAMUSCULAR | Status: AC
  Filled 2011-07-03: qty 3

## 2011-07-03 MED ORDER — METHYLPREDNISOLONE SODIUM SUCC 125 MG IJ SOLR
60.0000 mg | Freq: Four times a day (QID) | INTRAMUSCULAR | Status: DC
  Administered 2011-07-03 – 2011-07-04 (×5): 60 mg via INTRAVENOUS
  Filled 2011-07-03 (×5): qty 2

## 2011-07-03 MED ORDER — IPRATROPIUM BROMIDE 0.02 % IN SOLN
0.5000 mg | Freq: Four times a day (QID) | RESPIRATORY_TRACT | Status: DC
  Administered 2011-07-03 – 2011-07-04 (×6): 0.5 mg via RESPIRATORY_TRACT
  Filled 2011-07-03 (×6): qty 2.5

## 2011-07-03 MED ORDER — DEXTROSE 5 % IV SOLN
500.0000 mg | INTRAVENOUS | Status: DC
  Filled 2011-07-03: qty 500

## 2011-07-03 MED ORDER — ENOXAPARIN SODIUM 40 MG/0.4ML ~~LOC~~ SOLN
40.0000 mg | SUBCUTANEOUS | Status: DC
  Administered 2011-07-03 – 2011-07-04 (×2): 40 mg via SUBCUTANEOUS
  Filled 2011-07-03 (×2): qty 0.4

## 2011-07-03 MED ORDER — SODIUM CHLORIDE 0.9 % IJ SOLN
3.0000 mL | INTRAMUSCULAR | Status: DC | PRN
  Administered 2011-07-04: 03:00:00 via INTRAVENOUS

## 2011-07-03 MED ORDER — SODIUM CHLORIDE 0.9 % IJ SOLN
3.0000 mL | Freq: Two times a day (BID) | INTRAMUSCULAR | Status: DC
  Administered 2011-07-03: 3 mL via INTRAVENOUS

## 2011-07-03 MED ORDER — GUAIFENESIN-DM 100-10 MG/5ML PO SYRP
5.0000 mL | ORAL_SOLUTION | ORAL | Status: DC | PRN
  Administered 2011-07-03 – 2011-07-04 (×2): 5 mL via ORAL
  Filled 2011-07-03 (×2): qty 5

## 2011-07-03 MED ORDER — DEXTROSE 5 % IV SOLN
1.0000 g | INTRAVENOUS | Status: DC
  Administered 2011-07-03: 1 g via INTRAVENOUS
  Filled 2011-07-03 (×2): qty 10

## 2011-07-03 NOTE — Progress Notes (Addendum)
[facility]                                                                                 Patient Demographics  Cameron Villarreal, is a 46 y.o. male  WUJ:[id number]  NFA:[id number]  DOB - 1965-12-20  Admit date - 07/02/2011  Admitting Physician Wilson Singer, MD  Outpatient Primary MD for the patient is No primary provider on file.  LOS - 1     Chief Complaint  Patient presents with  . Chest Pain        Subjective:   Cameron Villarreal today has, No headache, No chest pain, No abdominal pain - No Nausea, No new weakness tingling or numbness, No Cough - improved SOB.    Objective:   Filed Vitals:   07/02/11 2334 07/03/11 0244 07/03/11 0456 07/03/11 0708  BP: 123/79  110/67   Pulse: 91  86   Temp: 97.5 F (36.4 C)  97.7 F (36.5 C)   TempSrc: Oral     Resp: 20  20   Height: 5\' 8"  (1.727 m)     Weight: 65.3 kg (143 lb 15.4 oz)     SpO2: 95% 91% 95% 93%    Wt Readings from Last 3 Encounters:  07/02/11 65.3 kg (143 lb 15.4 oz)  06/17/10 69.854 kg (154 lb)  04/21/10 68.947 kg (152 lb)    No intake or output data in the 24 hours ending 07/03/11 0821  Exam Awake Alert, Oriented *3, No new F.N deficits, Normal affect [place].AT,PERRAL Supple Neck,No JVD, No cervical lymphadenopathy appriciated.  Symmetrical Chest wall movement, minimal air movement bilaterally, few wheezes RRR,No Gallops,Rubs or new Murmurs, No Parasternal Heave +ve B.Sounds, Abd Soft, Non tender, No organomegaly appriciated, No rebound -guarding or rigidity. No Cyanosis, Clubbing or edema, No new Rash or bruise     Data Review  CBC  Lab 07/03/11 0622 07/02/11 1740  WBC 13.8* 20.4*  HGB 13.9 14.7  HCT 43.0 44.3  PLT 270 246  MCV 90.7 91.0  MCH 29.3 30.2  MCHC 32.3 33.2  RDW 14.1 14.2  LYMPHSABS -- 3.1  MONOABS -- 1.8*  EOSABS -- 0.0  BASOSABS -- 0.0  BANDABS -- --    Chemistries   Lab 07/03/11 0622 07/02/11 1740  NA 134* 132*  K 4.3 3.7  CL 97 94*  CO2 28 27  GLUCOSE  166* 139*  BUN 9 9  CREATININE 0.54 0.59  CALCIUM 10.0 9.9  MG -- --  AST -- 19  ALT -- 15  ALKPHOS -- 117  BILITOT -- 0.5   ------------------------------------------------------------------------------------------------------------------ estimated creatinine clearance is 105.4 ml/min (by C-G formula based on Cr of 0.54). ------------------------------------------------------------------------------------------------------------------ No results found for this basename: HGBA1C:2 in the last 72 hours ------------------------------------------------------------------------------------------------------------------ No results found for this basename: CHOL:2,HDL:2,LDLCALC:2,TRIG:2,CHOLHDL:2,LDLDIRECT:2 in the last 72 hours ------------------------------------------------------------------------------------------------------------------ No results found for this basename: TSH,T4TOTAL,FREET3,T3FREE,THYROIDAB in the last 72 hours ------------------------------------------------------------------------------------------------------------------ No results found for this basename: VITAMINB12:2,FOLATE:2,FERRITIN:2,TIBC:2,IRON:2,RETICCTPCT:2 in the last 72 hours  Coagulation profile No results found for this basename: INR:5,PROTIME:5 in the last 168 hours  No results found for this basename: DDIMER:2 in the last 72 hours  Cardiac Enzymes No results found  for this basename: CK:3,CKMB:3,TROPONINI:3,MYOGLOBIN:3 in the last 168 hours ------------------------------------------------------------------------------------------------------------------ No components found with this basename: POCBNP:3  Micro Results No results found for this or any previous visit (from the past 240 hour(s)).  Radiology Reports Dg Chest 2 View  07/02/2011  *RADIOLOGY REPORT*  Clinical Data: Cough and fever.  Shortness of breath.  CHEST - 2 VIEW  Comparison: 01/14/2009  Findings: Severe pulmonary emphysema again  demonstrated.  No evidence of acute infiltrate or pleural effusion.  Heart size is normal.  No mass or lymphadenopathy identified.  IMPRESSION: Severe pulmonary emphysema.  No active disease.  Original Report Authenticated By: Danae Orleans, M.D.   Ct Chest Wo Contrast  06/21/2011  *RADIOLOGY REPORT*  Clinical Data: Follow-up lung nodule.  Prior exam 04/29/2010. Annual follow-up for 4 mm pulmonary nodule.  CT CHEST WITHOUT CONTRAST  Technique:  Multidetector CT imaging of the chest was performed following the standard protocol without IV contrast.  Comparison: 04/29/2010.  Findings: Severe pulmonary emphysema is present.  The left upper lobe pulmonary nodule is unchanged, continuing to measure 4 mm (image 26 series 3).  Scattered other smaller peripheral pulmonary nodules are present.  There is no mediastinal, hilar, or axillary lymphadenopathy.  There is no axillary, mediastinal, or hilar adenopathy.  Thoracic spondylosis is mild, with multilevel Schmorl's nodes.  Incidental imaging the upper abdomen is within normal limits.  Mild abdominal aortic atherosclerosis.  Cholecystectomy.  Coronary artery atherosclerosis is present. If office based assessment of coronary risk factors has not been performed, it is now recommended. Unchanged Bochdalek hernia.  IMPRESSION:  1.  Stable 4 mm left upper lobe pulmonary nodule.  No further follow-up required. 2.  Severe emphysema.  Original Report Authenticated By: Andreas Newport, M.D.    Scheduled Meds:   . albuterol  2.5 mg Nebulization Q6H  . albuterol  5 mg Nebulization Once  . albuterol  5 mg Nebulization Once  . albuterol      . azithromycin  500 mg Intravenous Once  . azithromycin  500 mg Intravenous Q24H  . cefTRIAXone (ROCEPHIN)  IV  1 g Intravenous Once  . cefTRIAXone (ROCEPHIN)  IV  1 g Intravenous Q24H  . enoxaparin (LOVENOX) injection  40 mg Subcutaneous Q24H  . ipratropium      . ipratropium  0.5 mg Nebulization Once  . ipratropium  0.5 mg  Nebulization Once  . ipratropium  0.5 mg Nebulization Q6H  . methylPREDNISolone (SOLU-MEDROL) injection  125 mg Intravenous Once  . methylPREDNISolone (SOLU-MEDROL) injection  60 mg Intravenous Q6H  . sodium chloride  1,000 mL Intravenous Once  . sodium chloride  3 mL Intravenous Q12H  . sodium chloride  3 mL Intravenous Q12H  . traMADol  100 mg Oral Once  . DISCONTD: albuterol  5 mg Nebulization Q4H  . DISCONTD: ipratropium  0.5 mg Nebulization Q4H  . DISCONTD: methocarbamol  1,000 mg Oral TID  . DISCONTD: predniSONE  30 mg Oral Q breakfast   Continuous Infusions:   . DISCONTD: sodium chloride    . DISCONTD: sodium chloride     PRN Meds:.sodium chloride, albuterol, guaiFENesin-dextromethorphan, HYDROcodone-acetaminophen, sodium chloride  Assessment & Plan   1. Acute respiratory failure who secondary to acute COPD exacerbation in a patient with history of COPD, with likely early pneumonia versus acute bronchitis -  patient continues to smoke and has been counseled to stop smoking, his leukocytosis is better with present antibiotics, chest x-ray does not show any acute infiltrate. Plan is to change his steroids to  IV as patient is hardly moving any air bilaterally, however he symptomatically feels better, he's not short of breath at rest, nebulizer treatments scheduled and when necessary will be continued, 2 view chest x-ray will be repeated in the morning to look for any developing infiltrate.    2. CAP (community acquired pneumonia) - initial chest x-ray is negative however he could have had a early pneumonia which is not showing on chest x-ray yet, repeat chest x-ray in the morning, follow cultures, continue Rocephin and azithromycin for now.    3. History of tobacco abuse patient counseled to quit smoking Will add nicotine patch   4. Stable pulmonary nodule outpatient pulmonary followup.    DVT Prophylaxis  switch to  Lovenox From SCDs   Please follow chest x-ray and labs in  the morning, patient will need outpatient pulmonary followup.  Leroy Sea M.D on 07/03/2011 at 8:21 AM   [facility] Office  [phone]

## 2011-07-04 ENCOUNTER — Encounter (HOSPITAL_COMMUNITY): Payer: Self-pay | Admitting: Internal Medicine

## 2011-07-04 ENCOUNTER — Inpatient Hospital Stay (HOSPITAL_COMMUNITY): Payer: Self-pay

## 2011-07-04 DIAGNOSIS — R739 Hyperglycemia, unspecified: Secondary | ICD-10-CM | POA: Diagnosis not present

## 2011-07-04 DIAGNOSIS — J159 Unspecified bacterial pneumonia: Secondary | ICD-10-CM

## 2011-07-04 DIAGNOSIS — F17200 Nicotine dependence, unspecified, uncomplicated: Secondary | ICD-10-CM

## 2011-07-04 DIAGNOSIS — J441 Chronic obstructive pulmonary disease with (acute) exacerbation: Secondary | ICD-10-CM

## 2011-07-04 DIAGNOSIS — T50905A Adverse effect of unspecified drugs, medicaments and biological substances, initial encounter: Secondary | ICD-10-CM | POA: Diagnosis not present

## 2011-07-04 DIAGNOSIS — R911 Solitary pulmonary nodule: Secondary | ICD-10-CM | POA: Diagnosis present

## 2011-07-04 HISTORY — DX: Solitary pulmonary nodule: R91.1

## 2011-07-04 LAB — BASIC METABOLIC PANEL
BUN: 11 mg/dL (ref 6–23)
GFR calc Af Amer: >90 mL/min (ref >90)
GFR calc non Af Amer: >90 mL/min (ref >90)
Potassium: 4.4 mEq/L (ref 3.5–5.1)
Sodium: 136 mEq/L (ref 135–145)

## 2011-07-04 LAB — CBC
MCHC: 32.3 g/dL (ref 30.0–36.0)
RDW: 14.3 % (ref 11.5–15.5)

## 2011-07-04 MED ORDER — INSULIN GLARGINE 100 UNIT/ML ~~LOC~~ SOLN
10.0000 [IU] | Freq: Every day | SUBCUTANEOUS | Status: DC
Start: 2011-07-04 — End: 2011-07-04

## 2011-07-04 MED ORDER — ALBUTEROL SULFATE HFA 108 (90 BASE) MCG/ACT IN AERS: 2.0000 | INHALATION_SPRAY | RESPIRATORY_TRACT | Status: DC | PRN

## 2011-07-04 MED ORDER — LEVOFLOXACIN 500 MG PO TABS: 500.0000 mg | ORAL_TABLET | Freq: Every day | ORAL | Status: AC

## 2011-07-04 MED ORDER — INSULIN ASPART 100 UNIT/ML ~~LOC~~ SOLN
0.0000 [IU] | Freq: Three times a day (TID) | SUBCUTANEOUS | Status: DC
  Administered 2011-07-04: 2 [IU] via SUBCUTANEOUS

## 2011-07-04 MED ORDER — ALBUTEROL SULFATE (5 MG/ML) 0.5% IN NEBU: 2.5000 mg | INHALATION_SOLUTION | Freq: Four times a day (QID) | RESPIRATORY_TRACT | Status: DC

## 2011-07-04 MED ORDER — INSULIN ASPART 100 UNIT/ML ~~LOC~~ SOLN: 0.0000 [IU] | Freq: Every day | SUBCUTANEOUS | Status: DC

## 2011-07-04 MED ORDER — PREDNISONE 50 MG PO TABS: ORAL_TABLET | ORAL | Status: AC

## 2011-07-04 NOTE — Progress Notes (Signed)
UR chart review completed.  

## 2011-07-04 NOTE — Discharge Summary (Signed)
Physician Discharge Summary  Cameron Villarreal MRN: [id number] DOB/AGE: 05/19/1965 46 y.o.  PCP: [facility].   Admit date: 07/02/2011 Discharge date: 07/04/2011  Discharge Diagnoses:  1. Emphysema/COPD with acute exacerbation. 2. Stable 4 mm lung nodule. 3. Possible early community-acquired pneumonia. 4. Tobacco abuse. 5. Steroid-induced hyperglycemia. 6. Steroid-induced leukocytosis. 7. Mild hyponatremia, resolved.   Medication List  As of 07/04/2011  9:49 AM   TAKE these medications         ADVAIR DISKUS 250-50 MCG/DOSE Aepb   Generic drug: Fluticasone-Salmeterol   Inhale 1 puff into the lungs every 12 (twelve) hours.      albuterol 108 (90 BASE) MCG/ACT inhaler   Commonly known as: PROVENTIL HFA;VENTOLIN HFA   Inhale 2 puffs into the lungs every 4 (four) hours as needed for wheezing or shortness of breath. Shortness of breath      albuterol (5 MG/ML) 0.5% nebulizer solution   Commonly known as: PROVENTIL   Take 0.5 mLs (2.5 mg total) by nebulization 4 (four) times daily.      HYDROcodone-acetaminophen 5-500 MG per tablet   Commonly known as: VICODIN   Take 1 tablet by mouth every 6 (six) hours as needed. pain      levofloxacin 500 MG tablet   Commonly known as: LEVAQUIN   Take 1 tablet (500 mg total) by mouth daily. ANTIBIOTIC.      loratadine 10 MG tablet   Commonly known as: CLARITIN   Take 10 mg by mouth daily.      pravastatin 20 MG tablet   Commonly known as: PRAVACHOL   Take 20 mg by mouth daily.      predniSONE 50 MG tablet   Commonly known as: DELTASONE   Take 6 tablets for 1 day; then 5 tablets next day; then 4 tablets the next day; then 3 tablets the next day; then 2 tablets next day; then 1 tablet the next day; then stop.            Discharge Condition: Improved.  Disposition: 01-Home or Self Care   Consults: None.   Significant Diagnostic Studies: Dg Chest 2 View  07/02/2011  *RADIOLOGY REPORT*  Clinical Data: Cough and  fever.  Shortness of breath.  CHEST - 2 VIEW  Comparison: 01/14/2009  Findings: Severe pulmonary emphysema again demonstrated.  No evidence of acute infiltrate or pleural effusion.  Heart size is normal.  No mass or lymphadenopathy identified.  IMPRESSION: Severe pulmonary emphysema.  No active disease.  Original Report Authenticated By: Danae Orleans, M.D.   Ct Chest Wo Contrast  06/21/2011  *RADIOLOGY REPORT*  Clinical Data: Follow-up lung nodule.  Prior exam 04/29/2010. Annual follow-up for 4 mm pulmonary nodule.  CT CHEST WITHOUT CONTRAST  Technique:  Multidetector CT imaging of the chest was performed following the standard protocol without IV contrast.  Comparison: 04/29/2010.  Findings: Severe pulmonary emphysema is present.  The left upper lobe pulmonary nodule is unchanged, continuing to measure 4 mm (image 26 series 3).  Scattered other smaller peripheral pulmonary nodules are present.  There is no mediastinal, hilar, or axillary lymphadenopathy.  There is no axillary, mediastinal, or hilar adenopathy.  Thoracic spondylosis is mild, with multilevel Schmorl's nodes.  Incidental imaging the upper abdomen is within normal limits.  Mild abdominal aortic atherosclerosis.  Cholecystectomy.  Coronary artery atherosclerosis is present. If office based assessment of coronary risk factors has not been performed, it is now recommended. Unchanged Bochdalek hernia.  IMPRESSION:  1.  Stable 4 mm left upper lobe pulmonary nodule.  No further follow-up required. 2.  Severe emphysema.  Original Report Authenticated By: Andreas Newport, M.D.     Microbiology: Recent Results (from the past 240 hour(s))  CULTURE, RESPIRATORY     Status: Normal (Preliminary result)   Collection Time   07/03/11 12:02 AM      Component Value Range Status Comment   Specimen Description SPU   Final    Special Requests NONE   Final    Gram Stain PENDING   Incomplete    Culture Culture reincubated for better growth   Final    Report  Status PENDING   Incomplete      Labs: Results for orders placed during the hospital encounter of 07/02/11 (from the past 48 hour(s))  POCT I-STAT TROPONIN I     Status: Normal   Collection Time   07/02/11  5:14 PM      Component Value Range Comment   Troponin i, poc 0.00  0.00 - 0.08 (ng/mL)    Comment 3            CBC     Status: Abnormal   Collection Time   07/02/11  5:40 PM      Component Value Range Comment   WBC 20.4 (*) 4.0 - 10.5 (K/uL)    RBC 4.87  4.22 - 5.81 (MIL/uL)    Hemoglobin 14.7  13.0 - 17.0 (g/dL)    HCT 40.9  81.1 - 91.4 (%)    MCV 91.0  78.0 - 100.0 (fL)    MCH 30.2  26.0 - 34.0 (pg)    MCHC 33.2  30.0 - 36.0 (g/dL)    RDW 78.2  95.6 - 21.3 (%)    Platelets 246  150 - 400 (K/uL)   DIFFERENTIAL     Status: Abnormal   Collection Time   07/02/11  5:40 PM      Component Value Range Comment   Neutrophils Relative 76  43 - 77 (%)    Lymphocytes Relative 15  12 - 46 (%)    Monocytes Relative 9  3 - 12 (%)    Eosinophils Relative 0  0 - 5 (%)    Basophils Relative 0  0 - 1 (%)    Band Neutrophils 0  0 - 10 (%)    Metamyelocytes Relative 0      Myelocytes 0      Promyelocytes Absolute 0      Blasts 0      nRBC 0  0 (/100 WBC)    Neutro Abs 15.5 (*) 1.7 - 7.7 (K/uL)    Lymphs Abs 3.1  0.7 - 4.0 (K/uL)    Monocytes Absolute 1.8 (*) 0.1 - 1.0 (K/uL)    Eosinophils Absolute 0.0  0.0 - 0.7 (K/uL)    Basophils Absolute 0.0  0.0 - 0.1 (K/uL)   COMPREHENSIVE METABOLIC PANEL     Status: Abnormal   Collection Time   07/02/11  5:40 PM      Component Value Range Comment   Sodium 132 (*) 135 - 145 (mEq/L)    Potassium 3.7  3.5 - 5.1 (mEq/L)    Chloride 94 (*) 96 - 112 (mEq/L)    CO2 27  19 - 32 (mEq/L)    Glucose, Bld 139 (*) 70 - 99 (mg/dL)    BUN 9  6 - 23 (mg/dL)    Creatinine, Ser 0.86  0.50 - 1.35 (mg/dL)    Calcium  9.9  8.4 - 10.5 (mg/dL)    Total Protein 8.2  6.0 - 8.3 (g/dL)    Albumin 3.8  3.5 - 5.2 (g/dL)    AST 19  0 - 37 (U/L)    ALT 15  0 - 53 (U/L)     Alkaline Phosphatase 117  39 - 117 (U/L)    Total Bilirubin 0.5  0.3 - 1.2 (mg/dL)    GFR calc non Af Amer >90  >90 (mL/min)    GFR calc Af Amer >90  >90 (mL/min)   CULTURE, RESPIRATORY     Status: Normal (Preliminary result)   Collection Time   07/03/11 12:02 AM      Component Value Range Comment   Specimen Description SPU      Special Requests NONE      Gram Stain PENDING      Culture Culture reincubated for better growth      Report Status PENDING     BASIC METABOLIC PANEL     Status: Abnormal   Collection Time   07/03/11  6:22 AM      Component Value Range Comment   Sodium 134 (*) 135 - 145 (mEq/L)    Potassium 4.3  3.5 - 5.1 (mEq/L)    Chloride 97  96 - 112 (mEq/L)    CO2 28  19 - 32 (mEq/L)    Glucose, Bld 166 (*) 70 - 99 (mg/dL)    BUN 9  6 - 23 (mg/dL)    Creatinine, Ser 8.46  0.50 - 1.35 (mg/dL)    Calcium 96.2  8.4 - 10.5 (mg/dL)    GFR calc non Af Amer >90  >90 (mL/min)    GFR calc Af Amer >90  >90 (mL/min)   CBC     Status: Abnormal   Collection Time   07/03/11  6:22 AM      Component Value Range Comment   WBC 13.8 (*) 4.0 - 10.5 (K/uL)    RBC 4.74  4.22 - 5.81 (MIL/uL)    Hemoglobin 13.9  13.0 - 17.0 (g/dL)    HCT 95.2  84.1 - 32.4 (%)    MCV 90.7  78.0 - 100.0 (fL)    MCH 29.3  26.0 - 34.0 (pg)    MCHC 32.3  30.0 - 36.0 (g/dL)    RDW 40.1  02.7 - 25.3 (%)    Platelets 270  150 - 400 (K/uL)   GLUCOSE, CAPILLARY     Status: Abnormal   Collection Time   07/03/11 11:33 AM      Component Value Range Comment   Glucose-Capillary 131 (*) 70 - 99 (mg/dL)    Comment 1 Documented in Chart      Comment 2 Notify RN     CBC     Status: Abnormal   Collection Time   07/04/11  5:40 AM      Component Value Range Comment   WBC 20.6 (*) 4.0 - 10.5 (K/uL)    RBC 4.81  4.22 - 5.81 (MIL/uL)    Hemoglobin 14.2  13.0 - 17.0 (g/dL)    HCT 66.4  40.3 - 47.4 (%)    MCV 91.3  78.0 - 100.0 (fL)    MCH 29.5  26.0 - 34.0 (pg)    MCHC 32.3  30.0 - 36.0 (g/dL)    RDW 25.9  56.3 -  87.5 (%)    Platelets 311  150 - 400 (K/uL)   BASIC METABOLIC PANEL     Status:  Abnormal   Collection Time   07/04/11  5:40 AM      Component Value Range Comment   Sodium 136  135 - 145 (mEq/L)    Potassium 4.4  3.5 - 5.1 (mEq/L)    Chloride 97  96 - 112 (mEq/L)    CO2 29  19 - 32 (mEq/L)    Glucose, Bld 161 (*) 70 - 99 (mg/dL)    BUN 11  6 - 23 (mg/dL)    Creatinine, Ser 6.21  0.50 - 1.35 (mg/dL)    Calcium 30.8  8.4 - 10.5 (mg/dL)    GFR calc non Af Amer >90  >90 (mL/min)    GFR calc Af Amer >90  >90 (mL/min)      HPI : The patient is a 45 year old man with a history significant for COPD, tobacco abuse, hyperlipidemia, and reflux esophagitis, who presented to the emergency department on 07/02/2011 with a chief complaint of shortness of breath and fever for several days. In the emergency department, he was afebrile and tachycardic. He was oxygenating 89-93%. His lab data were significant for a serum sodium of 132, WBC of 20.4, and glucose of 139. CT scan of his chest revealed a stable 4 mm left upper lobe pulmonary nodule and severe emphysema. He was admitted for further evaluation and management.  HOSPITAL COURSE: A sputum culture was ordered on admission. The results remained pending at the time of discharge. The patient was started on antibiotic therapy with Rocephin and azithromycin. He was ordered Solu-Medrol as well. Albuterol and Atrovent nebulizers were administered every 6 hours and then every 2 hours as needed. Gentle IV fluids were started for hydration. A nicotine patch was placed daily. The patient was advised to stop smoking. Because of steroid-induced hyperglycemia, sliding scale NovoLog was started. His leukocytosis improved to 13.8, but increased to 20.6 prior to discharge. This was thought to be secondary to IV steroids.Marland Kitchen He remained afebrile.  The patient improved clinically and symptomatically. He was discharged to home on ongoing antibiotic therapy with Levaquin, ongoing  steroid therapy with a prednisone taper, and continued home bronchodilator therapy with Advair and albuterol. The patient was again advised to stop smoking prior to discharge. He was also instructed to follow a relatively low sugar diet while on prednisone. He voiced understanding.     Discharge Exam:  Blood pressure 116/79, pulse 76, temperature 97.5 F (36.4 C), temperature source Oral, resp. rate 16, height 5\' 8"  (1.727 m), weight 65.3 kg (143 lb 15.4 oz), SpO2 97.00%.  Lungs: Decreased breath sounds in the bases and clear anteriorly. Breathing nonlabored. Heart: S1, S2, with no murmurs rubs or gallops. Abdomen: Positive bowel sounds, soft, nontender, nondistended. Extremities: No pedal edema.   Discharge Orders    Future Orders Please Complete By Expires   Diet general      Increase activity slowly      Discharge instructions      Comments:   Do not smoke. Take medications as prescribed. Avoid eating or drinking sugary foods while on prednisone. Stay hydrated with water.      Follow-up Information    Follow up with [facility]. Schedule an appointment as soon as possible for a visit in 1 week.         Total discharge time: 25 minutes.  Signed: Earle Burson 07/04/2011, 9:49 AM

## 2011-07-04 NOTE — Progress Notes (Signed)
Dicussed discharge instructions and medication sheet with patient and he verbalized an understanding.  Px signed the discharge instructions and medication sheet. Removed px IV- no signs of bruising or hematoma. Px alert and oriented upon leaving the unit. Marton Redwood, RN, MSN

## 2011-07-04 NOTE — Progress Notes (Signed)
Px refused to take dose of Zithromax. Was ready to go home and did not want to wait for medication to be infused. Marton Redwood, RN, MSN

## 2011-07-04 NOTE — Care Management Note (Signed)
    Page 1 of 1   07/04/2011     12:31:31 PM   CARE MANAGEMENT NOTE 07/04/2011  Patient:  Cameron Villarreal, Cameron Villarreal   Account Number:  [id number]  Date Initiated:  07/04/2011  Documentation initiated by:  Sharrie Rothman  Subjective/Objective Assessment:   Pt admitted from home with COPD exacerbation. Pt lives with parents and is indpendent with ADL's. Pt will return home at discharge. Pt stated that he follows up with MD at [facility] in [place].     Action/Plan:   Financial counselor notified of pts self pay status. Pt stated that he could afford to get medications filled at discharge. No CM needs noted.   Anticipated DC Date:  07/04/2011   Anticipated DC Plan:  HOME/SELF CARE  In-house referral  Financial Counselor      DC Planning Services  CM consult      Choice offered to / List presented to:             Status of service:  Completed, signed off Medicare Important Message given?   (If response is "NO", the following Medicare IM given date fields will be blank) Date Medicare IM given:   Date Additional Medicare IM given:    Discharge Disposition:  HOME/SELF CARE  Per UR Regulation:    If discussed at Long Length of Stay Meetings, dates discussed:    Comments:  07/04/11 1230 Arlyss Queen, RN BSN CM

## 2011-07-05 LAB — CULTURE, RESPIRATORY W GRAM STAIN: Culture: NORMAL

## 2011-07-26 ENCOUNTER — Encounter: Payer: Self-pay | Admitting: Cardiology

## 2011-07-26 ENCOUNTER — Ambulatory Visit (INDEPENDENT_AMBULATORY_CARE_PROVIDER_SITE_OTHER): Payer: Self-pay | Admitting: Cardiology

## 2011-07-26 VITALS — BP 130/81 | HR 77 | Resp 18 | Ht 68.0 in | Wt 147.0 lb

## 2011-07-26 DIAGNOSIS — R079 Chest pain, unspecified: Secondary | ICD-10-CM

## 2011-07-26 DIAGNOSIS — F17200 Nicotine dependence, unspecified, uncomplicated: Secondary | ICD-10-CM

## 2011-07-26 DIAGNOSIS — Z720 Tobacco use: Secondary | ICD-10-CM

## 2011-07-26 HISTORY — DX: Chest pain, unspecified: R07.9

## 2011-07-26 NOTE — Assessment & Plan Note (Signed)
This is noncardiac. He coughs a lot with his tobacco use and severe emphysema. Suspect is muscular in nature. Reassurance given that this is noncardiac. We'll see back when necessary. Patient strongly advised to stop smoking.

## 2011-07-26 NOTE — Progress Notes (Signed)
HPI Cameron Villarreal is a 46 year old white male who [facility] pains. These been going on for several months. Prescribed as sharp lasting up to a minute in the mid sternum. He does have significant dyspnea on exertion but has severe emphysema and COPD.  He has not had any chest discomfort or angina with exertion.  His risk factors include tobacco use which is been heavy since a teenager, hyperlipidemia.  He does have a history of reflux esophagitis and was evaluated by GI about a year ago.  Used to be a heavy drinker but no longer.  He denies orthopnea, PND or edema. He has had no palpitations or syncope.  Past Medical History  Diagnosis Date  . COPD (chronic obstructive pulmonary disease)     Severe emphysema, radiographically.  . Hypercholesterolemia   . S/P endoscopy March 2012    reflux esophagitis, no ulcerations  . S/P colonoscopy March 2012    normal  . Pulmonary nodule 07/04/2011    Current Outpatient Prescriptions  Medication Sig Dispense Refill  . albuterol (PROVENTIL HFA;VENTOLIN HFA) 108 (90 BASE) MCG/ACT inhaler Inhale 2 puffs into the lungs every 4 (four) hours as needed for wheezing or shortness of breath. Shortness of breath      . albuterol (PROVENTIL) (5 MG/ML) 0.5% nebulizer solution Take 0.5 mLs (2.5 mg total) by nebulization 4 (four) times daily.  20 mL    . Fluticasone-Salmeterol (ADVAIR DISKUS) 250-50 MCG/DOSE AEPB Inhale 1 puff into the lungs every 12 (twelve) hours.        Marland Kitchen HYDROcodone-acetaminophen (VICODIN) 5-500 MG per tablet Take 1 tablet by mouth every 6 (six) hours as needed. pain      . loratadine (CLARITIN) 10 MG tablet Take 10 mg by mouth daily.        . pravastatin (PRAVACHOL) 20 MG tablet Take 20 mg by mouth daily.          No Known Allergies  Family History  Problem Relation Age of Onset  . Cirrhosis Father     deceased, ETOH cirrhosis  . Stomach cancer      aunt    History   Social History  . Marital  Status: Single    Spouse Name: N/A    Number of Children: N/A  . Years of Education: N/A   Occupational History  . Not on file.   Social History Main Topics  . Smoking status: Current Everyday Smoker -- 0.5 packs/day    Types: Cigarettes  . Smokeless tobacco: Not on file  . Alcohol Use: 0.5 oz/week    1 drink(s) per week  . Drug Use: No  . Sexually Active: Not on file   Other Topics Concern  . Not on file   Social History Narrative  . No narrative on file    ROS ALL NEGATIVE EXCEPT THOSE NOTED IN HPI  PE  General Appearance: well developed, well nourished in no acute distress, looks much older than stated age. HEENT: symmetrical face, PERRLA, eyes are bloodshot, poor dentition  Neck: no JVD, thyromegaly, or adenopathy, trachea midline Chest: symmetric without deformity, no chest tenderness or soreness to palpation Cardiac: PMI non-displaced, RRR, soft S1-S2 no gallop or murmur Lung: Markedly decreased breath sounds throughout Vascular: all pulses full without bruits  Abdominal: nondistended, nontender, good bowel sounds, no HSM, no bruits Extremities: no cyanosis, clubbing or edema, no sign of DVT, no varicosities  Skin: normal color, no rashes Neuro: alert and oriented x 3, non-focal  Pysch: normal affect  EKG Reviewed from tracing on 07/02/2011. She is sinus tachycardia otherwise normal EKG.  Echocardiogram from 05/04/2010 was normal.  Chest x-ray May 26 showed severe COPD and emphysema. BMET    Component Value Date/Time   NA 136 07/04/2011 0540   K 4.4 07/04/2011 0540   CL 97 07/04/2011 0540   CO2 29 07/04/2011 0540   GLUCOSE 161* 07/04/2011 0540   BUN 11 07/04/2011 0540   CREATININE 0.59 07/04/2011 0540   CALCIUM 10.2 07/04/2011 0540   GFRNONAA >90 07/04/2011 0540   GFRAA >90 07/04/2011 0540    Lipid Panel  No results found for this basename: chol, trig, hdl, cholhdl, vldl, ldlcalc    CBC    Component Value Date/Time   WBC 20.6* 07/04/2011 0540   RBC 4.81  07/04/2011 0540   HGB 14.2 07/04/2011 0540   HCT 43.9 07/04/2011 0540   PLT 311 07/04/2011 0540   MCV 91.3 07/04/2011 0540   MCH 29.5 07/04/2011 0540   MCHC 32.3 07/04/2011 0540   RDW 14.3 07/04/2011 0540   LYMPHSABS 3.1 07/02/2011 1740   MONOABS 1.8* 07/02/2011 1740   EOSABS 0.0 07/02/2011 1740   BASOSABS 0.0 07/02/2011 1740

## 2011-07-26 NOTE — Patient Instructions (Signed)
**Note De-identified Bergen Magner Obfuscation** Your physician recommends that you continue on your current medications as directed. Please refer to the Current Medication list given to you today.  Your physician recommends that you schedule a follow-up appointment in: as needed  

## 2011-12-15 ENCOUNTER — Emergency Department (HOSPITAL_COMMUNITY)
Admission: EM | Admit: 2011-12-15 | Discharge: 2011-12-15 | Disposition: A | Discharge status: Home / Self Care | Payer: Self-pay | Attending: Emergency Medicine | Admitting: Emergency Medicine

## 2011-12-15 ENCOUNTER — Emergency Department (HOSPITAL_COMMUNITY): Payer: Self-pay

## 2011-12-15 ENCOUNTER — Encounter (HOSPITAL_COMMUNITY): Payer: Self-pay | Admitting: Emergency Medicine

## 2011-12-15 DIAGNOSIS — E7800 Pure hypercholesterolemia, unspecified: Secondary | ICD-10-CM | POA: Insufficient documentation

## 2011-12-15 DIAGNOSIS — Z79899 Other long term (current) drug therapy: Secondary | ICD-10-CM | POA: Insufficient documentation

## 2011-12-15 DIAGNOSIS — Z9889 Other specified postprocedural states: Secondary | ICD-10-CM | POA: Insufficient documentation

## 2011-12-15 DIAGNOSIS — J441 Chronic obstructive pulmonary disease with (acute) exacerbation: Secondary | ICD-10-CM | POA: Insufficient documentation

## 2011-12-15 DIAGNOSIS — R911 Solitary pulmonary nodule: Secondary | ICD-10-CM | POA: Insufficient documentation

## 2011-12-15 DIAGNOSIS — R0789 Other chest pain: Secondary | ICD-10-CM

## 2011-12-15 DIAGNOSIS — R071 Chest pain on breathing: Secondary | ICD-10-CM | POA: Insufficient documentation

## 2011-12-15 LAB — COMPREHENSIVE METABOLIC PANEL
AST: 22 U/L (ref 0–37)
Albumin: 4.1 g/dL (ref 3.5–5.2)
Calcium: 9.7 mg/dL (ref 8.4–10.5)
Creatinine, Ser: 0.77 mg/dL (ref 0.50–1.35)
Sodium: 137 mEq/L (ref 135–145)

## 2011-12-15 LAB — CBC WITH DIFFERENTIAL/PLATELET
Basophils Absolute: 0 10*3/uL (ref 0.0–0.1)
Basophils Relative: 0 % (ref 0–1)
Eosinophils Relative: 2 % (ref 0–5)
HCT: 49.5 % (ref 39.0–52.0)
MCHC: 33.3 g/dL (ref 30.0–36.0)
MCV: 90.8 fL (ref 78.0–100.0)
Monocytes Absolute: 1.1 10*3/uL — ABNORMAL HIGH (ref 0.1–1.0)
Neutro Abs: 5.3 10*3/uL (ref 1.7–7.7)
Platelets: 282 10*3/uL (ref 150–400)
RDW: 13.3 % (ref 11.5–15.5)

## 2011-12-15 LAB — D-DIMER, QUANTITATIVE: D-Dimer, Quant: <0.27 ug/mL-FEU (ref 0.00–0.48)

## 2011-12-15 LAB — TROPONIN I: Troponin I: <0.3 ng/mL (ref <0.30)

## 2011-12-15 MED ORDER — IPRATROPIUM BROMIDE 0.02 % IN SOLN
0.5000 mg | Freq: Once | RESPIRATORY_TRACT | Status: AC
  Administered 2011-12-15: 0.5 mg via RESPIRATORY_TRACT
  Filled 2011-12-15: qty 2.5

## 2011-12-15 MED ORDER — PREDNISONE 50 MG PO TABS: ORAL_TABLET | ORAL | Status: DC

## 2011-12-15 MED ORDER — MORPHINE SULFATE 4 MG/ML IJ SOLN
4.0000 mg | Freq: Once | INTRAMUSCULAR | Status: AC
  Administered 2011-12-15: 4 mg via INTRAVENOUS
  Filled 2011-12-15: qty 1

## 2011-12-15 MED ORDER — ONDANSETRON HCL 4 MG/2ML IJ SOLN
4.0000 mg | Freq: Once | INTRAMUSCULAR | Status: AC
  Administered 2011-12-15: 4 mg via INTRAVENOUS
  Filled 2011-12-15: qty 2

## 2011-12-15 MED ORDER — KETOROLAC TROMETHAMINE 30 MG/ML IJ SOLN
INTRAMUSCULAR | Status: AC
  Filled 2011-12-15: qty 1

## 2011-12-15 MED ORDER — KETOROLAC TROMETHAMINE 30 MG/ML IJ SOLN
30.0000 mg | Freq: Once | INTRAMUSCULAR | Status: AC
  Administered 2011-12-15: 30 mg via INTRAVENOUS

## 2011-12-15 MED ORDER — KETOROLAC TROMETHAMINE 30 MG/ML IJ SOLN: 30.0000 mg | Freq: Once | INTRAMUSCULAR | Status: DC

## 2011-12-15 MED ORDER — ALBUTEROL SULFATE HFA 108 (90 BASE) MCG/ACT IN AERS: 2.0000 | INHALATION_SPRAY | RESPIRATORY_TRACT | Status: DC | PRN

## 2011-12-15 MED ORDER — IBUPROFEN 800 MG PO TABS: 800.0000 mg | ORAL_TABLET | Freq: Three times a day (TID) | ORAL | Status: DC

## 2011-12-15 MED ORDER — HYDROCODONE-ACETAMINOPHEN 5-325 MG PO TABS: 2.0000 | ORAL_TABLET | ORAL | Status: DC | PRN

## 2011-12-15 MED ORDER — METHYLPREDNISOLONE SODIUM SUCC 125 MG IJ SOLR
125.0000 mg | Freq: Once | INTRAMUSCULAR | Status: AC
  Administered 2011-12-15: 125 mg via INTRAVENOUS
  Filled 2011-12-15: qty 2

## 2011-12-15 MED ORDER — ALBUTEROL SULFATE (5 MG/ML) 0.5% IN NEBU
2.5000 mg | INHALATION_SOLUTION | Freq: Once | RESPIRATORY_TRACT | Status: AC
  Administered 2011-12-15: 2.5 mg via RESPIRATORY_TRACT
  Filled 2011-12-15: qty 0.5

## 2011-12-15 MED ORDER — DOXYCYCLINE HYCLATE 100 MG PO CAPS: 100.0000 mg | ORAL_CAPSULE | Freq: Two times a day (BID) | ORAL | Status: DC

## 2011-12-15 NOTE — ED Notes (Signed)
Male friend to desk, again asked how much longer, sprite given, rn attempted to explain delay.

## 2011-12-15 NOTE — ED Notes (Signed)
Pt returned to reg. Desk and stated that he was allergic to hydrocodone. Dr. Manus Gunning notified. He tore the hydrocodone script up and no new scripts for narcotics were given.

## 2011-12-15 NOTE — ED Notes (Signed)
Pt resting in bed, voices no complaints. Stated ready to go.  md aware.

## 2011-12-15 NOTE — ED Notes (Signed)
Walk pt around nurses desk O2 stayed at 100%

## 2011-12-15 NOTE — ED Notes (Signed)
Pt's sister is at reg. Desk and cont. To ask for narcotic prescription for her brother (the pt.), advised could only discuss w/ the pt. She walked outside and got the pt. Out of the car.   He stated he was tired and ready to go.  Dr.rancuour out to triage to speak to pt.  He wrote a new script of vicodin for the pt.   He stated he was allergic to tylenol #3 not vicodin.

## 2011-12-15 NOTE — ED Notes (Signed)
Pt c/o left chest pain with sob since Tuesday.

## 2011-12-15 NOTE — ED Provider Notes (Signed)
History   This chart was scribed for Cameron Octave, MD by Cameron Villarreal . The patient was seen in room APA06/APA06. Patient's care was started at 1035.   CSN: [id number]  Arrival date & time 12/15/11  1029   First MD Initiated Contact with Patient 12/15/11 1035      Chief Complaint  Patient presents with  . Chest Pain  . Shortness of Breath     The history is provided by the patient. No language interpreter was used.   Cameron Villarreal is a 46 y.o. male who presents to the Emergency Department complaining of constant, moderate left sided chest pain for the past 2 days with associated SOB. He states his symptoms are aggravated with coughing and breathing. He denies any fevers, leg swelling or pain, or abdominal pain. He reports a chronic cough, but states it hasn't worsened. He denies any h/o similar symptoms. He has a h/o COPD and wears O2 at home. He states he occasionally uses breathing treatment at home. He denies any cardiac hx. He smokes a pack/daily  Past Medical History  Diagnosis Date  . COPD (chronic obstructive pulmonary disease)     Severe emphysema, radiographically.  . Hypercholesterolemia   . S/P endoscopy March 2012    reflux esophagitis, no ulcerations  . S/P colonoscopy March 2012    normal  . Pulmonary nodule 07/04/2011    Past Surgical History  Procedure Date  . Cholecystectomy   . Appendectomy   . Right inguinal hernia repair     Family History  Problem Relation Age of Onset  . Cirrhosis Father     deceased, ETOH cirrhosis  . Stomach cancer      aunt    History  Substance Use Topics  . Smoking status: Current Every Day Smoker -- 0.5 packs/day    Types: Cigarettes  . Smokeless tobacco: Not on file  . Alcohol Use: 0.5 oz/week    1 drink(s) per week      Review of Systems A complete 10 system review of systems was obtained and all systems are negative except as noted in the HPI and PMH.   Allergies  Review of patient's allergies  indicates no known allergies.  Home Medications   Current Outpatient Rx  Name  Route  Sig  Dispense  Refill  . ALBUTEROL SULFATE HFA 108 (90 BASE) MCG/ACT IN AERS   Inhalation   Inhale 2 puffs into the lungs every 4 (four) hours as needed for wheezing or shortness of breath. Shortness of breath         . ALBUTEROL SULFATE (5 MG/ML) 0.5% IN NEBU   Nebulization   Take 0.5 mLs (2.5 mg total) by nebulization 4 (four) times daily.   20 mL      . FLUTICASONE-SALMETEROL 250-50 MCG/DOSE IN AEPB   Inhalation   Inhale 1 puff into the lungs every 12 (twelve) hours.           Marland Kitchen HYDROCODONE-ACETAMINOPHEN 5-500 MG PO TABS   Oral   Take 1 tablet by mouth every 6 (six) hours as needed. pain         . LORATADINE 10 MG PO TABS   Oral   Take 10 mg by mouth daily.           Marland Kitchen PRAVASTATIN SODIUM 20 MG PO TABS   Oral   Take 20 mg by mouth daily.             BP 135/90  Pulse  71  Temp 98 F (36.7 C) (Oral)  Resp 18  Ht 5\' 8"  (1.727 m)  Wt 152 lb (68.947 kg)  BMI 23.11 kg/m2  SpO2 100%  Physical Exam  Nursing note and vitals reviewed. Constitutional: He is oriented to person, place, and time. He appears well-developed and well-nourished. No distress.  HENT:  Head: Normocephalic and atraumatic.  Right Ear: External ear normal.  Left Ear: External ear normal.  Nose: Nose normal.  Mouth/Throat: Oropharynx is clear and moist. No oropharyngeal exudate.  Eyes: Conjunctivae normal and EOM are normal. Pupils are equal, round, and reactive to light.  Neck: Normal range of motion. Neck supple. No tracheal deviation present.  Cardiovascular: Normal rate, regular rhythm and normal heart sounds.   No murmur heard. Pulmonary/Chest: Effort normal. No respiratory distress. He has wheezes. He has no rales. He exhibits tenderness.       Coughs on exam. Moderate air exchange with wheezes. Reproducible left sided anterior chest wall tenderness.   Abdominal: Soft. Bowel sounds are normal. He  exhibits no distension. There is no tenderness.  Musculoskeletal: Normal range of motion. He exhibits no edema and no tenderness.       No calf swelling or asymmetry.   Neurological: He is alert and oriented to person, place, and time.  Skin: Skin is warm and dry.  Psychiatric: He has a normal mood and affect. His behavior is normal.    ED Course  Procedures (including critical care time)  DIAGNOSTIC STUDIES: Oxygen Saturation is 100% on room air, normal by my interpretation.    COORDINATION OF CARE:  10:50-Discussed planned course of treatment with the patient including pain and nausea medication, a breathing treatment, steroids, chest x-ray and blood work, who is agreeable at this time.   11:00-Medication Orders: Morphine 4 mg/mL injection 4 mg-once; Ondansetron (Zofran) injection 4 mg-once; Albuterol (Proventil) (5 mg/mL) 0.5% nebulizer solution 2.5 mg-once; Ipratropium (Atrovent) nebulizer solution 0.5 mg-once; Methylprednisolone sodium succinate (Solu-medrol) 125 mg/2 mL injection 125 mg-once  Labs Reviewed  CBC WITH DIFFERENTIAL - Abnormal; Notable for the following:    Monocytes Absolute 1.1 (*)     All other components within normal limits  COMPREHENSIVE METABOLIC PANEL - Abnormal; Notable for the following:    Glucose, Bld 116 (*)     Alkaline Phosphatase 120 (*)     All other components within normal limits  TROPONIN I  D-DIMER, QUANTITATIVE   Dg Chest 2 View  12/15/2011  *RADIOLOGY REPORT*  Clinical Data: Chest pain and shortness of breath  CHEST - 2 VIEW  Comparison: Jul 04, 2011  Findings:  There is underlying emphysema.  There is no edema or consolidation.  The heart size is normal.  The pulmonary vascularity reflects underlying emphysema and is stable.  No adenopathy.  No bone lesions.  IMPRESSION: Underlying emphysema.  No edema or consolidation.   Original Report Authenticated By: Bretta Bang, M.D.      No diagnosis found.    MDM  History of COPD  presenting with constant left-sided chest pain and shortness of breath for the past 3 days. Pain is constant and sharp it does not radiate. It is worse with palpation and deep breathing. Patient continues to smoke. He uses his nebulizer is occasionally. Denies any cough or fever. No previous cardiac history.  Suspect COPD exacerbation with muscular skeletal chest pain from coughing. Atypical for ACS. D-dimer negative. Chest x-ray unchanged.  Patient ambulatory in the ED without desaturation. We'll treat chest wall pain as well  as COPD exacerbation. Smoking cessation encouraged.   Date: 12/15/2011  Rate: 72  Rhythm: sinus arrhythmia  QRS Axis: normal  Intervals: normal  ST/T Wave abnormalities: normal  Conduction Disutrbances:none  Narrative Interpretation:   Old EKG Reviewed: none available   I personally performed the services described in this documentation, which was scribed in my presence.  The recorded information has been reviewed and considered.       Cameron Octave, MD 12/15/11 [phone]

## 2012-04-18 ENCOUNTER — Emergency Department (HOSPITAL_COMMUNITY): Payer: Self-pay

## 2012-04-18 ENCOUNTER — Emergency Department (HOSPITAL_COMMUNITY)
Admission: EM | Admit: 2012-04-18 | Discharge: 2012-04-18 | Disposition: A | Discharge status: Home / Self Care | Payer: Self-pay | Attending: Emergency Medicine | Admitting: Emergency Medicine

## 2012-04-18 ENCOUNTER — Encounter (HOSPITAL_COMMUNITY): Payer: Self-pay

## 2012-04-18 DIAGNOSIS — R112 Nausea with vomiting, unspecified: Secondary | ICD-10-CM | POA: Insufficient documentation

## 2012-04-18 DIAGNOSIS — R197 Diarrhea, unspecified: Secondary | ICD-10-CM | POA: Insufficient documentation

## 2012-04-18 DIAGNOSIS — Z7982 Long term (current) use of aspirin: Secondary | ICD-10-CM | POA: Insufficient documentation

## 2012-04-18 DIAGNOSIS — R630 Anorexia: Secondary | ICD-10-CM | POA: Insufficient documentation

## 2012-04-18 DIAGNOSIS — R509 Fever, unspecified: Secondary | ICD-10-CM | POA: Insufficient documentation

## 2012-04-18 DIAGNOSIS — Z79899 Other long term (current) drug therapy: Secondary | ICD-10-CM | POA: Insufficient documentation

## 2012-04-18 DIAGNOSIS — R059 Cough, unspecified: Secondary | ICD-10-CM | POA: Insufficient documentation

## 2012-04-18 DIAGNOSIS — IMO0002 Reserved for concepts with insufficient information to code with codable children: Secondary | ICD-10-CM | POA: Insufficient documentation

## 2012-04-18 DIAGNOSIS — J441 Chronic obstructive pulmonary disease with (acute) exacerbation: Secondary | ICD-10-CM | POA: Insufficient documentation

## 2012-04-18 DIAGNOSIS — F17200 Nicotine dependence, unspecified, uncomplicated: Secondary | ICD-10-CM | POA: Insufficient documentation

## 2012-04-18 DIAGNOSIS — R0602 Shortness of breath: Secondary | ICD-10-CM | POA: Insufficient documentation

## 2012-04-18 DIAGNOSIS — E7800 Pure hypercholesterolemia, unspecified: Secondary | ICD-10-CM | POA: Insufficient documentation

## 2012-04-18 LAB — CBC WITH DIFFERENTIAL/PLATELET
Basophils Absolute: 0 10*3/uL (ref 0.0–0.1)
Basophils Relative: 0 % (ref 0–1)
Hemoglobin: 16.4 g/dL (ref 13.0–17.0)
Lymphocytes Relative: 16 % (ref 12–46)
Lymphs Abs: 1.6 10*3/uL (ref 0.7–4.0)
MCHC: 33.2 g/dL (ref 30.0–36.0)
Monocytes Relative: 12 % (ref 3–12)
Neutro Abs: 7.4 10*3/uL (ref 1.7–7.7)
Neutrophils Relative %: 72 % (ref 43–77)
Platelets: 233 10*3/uL (ref 150–400)
RBC: 5.54 MIL/uL (ref 4.22–5.81)
WBC: 10.3 10*3/uL (ref 4.0–10.5)

## 2012-04-18 LAB — BASIC METABOLIC PANEL
BUN: 8 mg/dL (ref 6–23)
Chloride: 95 mEq/L — ABNORMAL LOW (ref 96–112)
GFR calc Af Amer: >90 mL/min (ref >90)
Potassium: 3.5 mEq/L (ref 3.5–5.1)

## 2012-04-18 LAB — PRO B NATRIURETIC PEPTIDE: Pro B Natriuretic peptide (BNP): 87.7 pg/mL (ref 0–125)

## 2012-04-18 LAB — SALICYLATE LEVEL: Salicylate Lvl: 6.4 mg/dL (ref 2.8–20.0)

## 2012-04-18 MED ORDER — NAPROXEN 500 MG PO TABS: 500.0000 mg | ORAL_TABLET | Freq: Two times a day (BID) | ORAL | Status: DC

## 2012-04-18 MED ORDER — IPRATROPIUM BROMIDE 0.02 % IN SOLN
0.5000 mg | Freq: Once | RESPIRATORY_TRACT | Status: AC
  Administered 2012-04-18: 0.5 mg via RESPIRATORY_TRACT
  Filled 2012-04-18: qty 2.5

## 2012-04-18 MED ORDER — LEVOFLOXACIN IN D5W 750 MG/150ML IV SOLN: 750.0000 mg | Freq: Once | INTRAVENOUS | Status: DC

## 2012-04-18 MED ORDER — AZITHROMYCIN 250 MG PO TABS: 250.0000 mg | ORAL_TABLET | Freq: Every day | ORAL | Status: DC

## 2012-04-18 MED ORDER — ACETAMINOPHEN 500 MG PO TABS
ORAL_TABLET | ORAL | Status: AC
  Filled 2012-04-18: qty 2

## 2012-04-18 MED ORDER — ALBUTEROL (5 MG/ML) CONTINUOUS INHALATION SOLN
10.0000 mg | INHALATION_SOLUTION | RESPIRATORY_TRACT | Status: AC
  Administered 2012-04-18: 10 mg via RESPIRATORY_TRACT
  Filled 2012-04-18: qty 20

## 2012-04-18 MED ORDER — SODIUM CHLORIDE 0.9 % IV BOLUS (SEPSIS)
1000.0000 mL | Freq: Once | INTRAVENOUS | Status: AC
  Administered 2012-04-18: 1000 mL via INTRAVENOUS

## 2012-04-18 MED ORDER — KETOROLAC TROMETHAMINE 30 MG/ML IJ SOLN
30.0000 mg | Freq: Once | INTRAMUSCULAR | Status: AC
  Administered 2012-04-18: 30 mg via INTRAVENOUS
  Filled 2012-04-18: qty 1

## 2012-04-18 MED ORDER — PREDNISONE 20 MG PO TABS: 40.0000 mg | ORAL_TABLET | Freq: Every day | ORAL | Status: DC

## 2012-04-18 MED ORDER — ALBUTEROL SULFATE (5 MG/ML) 0.5% IN NEBU: 2.5000 mg | INHALATION_SOLUTION | RESPIRATORY_TRACT | Status: DC | PRN

## 2012-04-18 MED ORDER — ACETAMINOPHEN 500 MG PO TABS
1000.0000 mg | ORAL_TABLET | Freq: Once | ORAL | Status: AC
  Administered 2012-04-18: 1000 mg via ORAL

## 2012-04-18 MED ORDER — PREDNISONE 50 MG PO TABS
60.0000 mg | ORAL_TABLET | Freq: Once | ORAL | Status: AC
  Administered 2012-04-18: 60 mg via ORAL
  Filled 2012-04-18: qty 1

## 2012-04-18 MED ORDER — ALBUTEROL SULFATE (5 MG/ML) 0.5% IN NEBU
5.0000 mg | INHALATION_SOLUTION | Freq: Once | RESPIRATORY_TRACT | Status: AC
  Administered 2012-04-18: 5 mg via RESPIRATORY_TRACT
  Filled 2012-04-18: qty 1

## 2012-04-18 MED ORDER — ONDANSETRON HCL 4 MG/2ML IJ SOLN
4.0000 mg | Freq: Once | INTRAMUSCULAR | Status: AC
  Administered 2012-04-18: 4 mg via INTRAVENOUS
  Filled 2012-04-18: qty 2

## 2012-04-18 NOTE — ED Provider Notes (Addendum)
History     This chart was scribed for Cameron Roller, MD, MD by Smitty Pluck, ED Scribe. The patient was seen in room APA17/[facility]17 and the patient's care was started at 2:18 PM.   CSN: [id number]  Arrival date & time 04/18/12  1359      Chief Complaint  Patient presents with  . Chest Pain  . Emesis  . Diarrhea  . Fever    The history is provided by the patient and medical records. No language interpreter was used.   Cameron Villarreal is a 47 y.o. male with hx of COPD and hypercholesterolemia who presents to the Emergency Department complaining of tactile fever (current ED temperature is 100) onset 5 days ago. Pt reports associated productive cough with white sputum, nausea, vomiting and diarrhea. Pt has taken about 1000 mg of asa last night for fever. Wife reports pt has had altered mental status as a result of fever last night, but this has since resolved. The diarrhea is watery and brown in color. Pt reports that he has decreased appetite since 4 days ago. Pt has used albuterol breathing treatment at home without relief. Pt denies blood in stool, blood in vomit, weakness, and any other pain. Pt does smoke cigarettes.  The drums are severe, persistent, nothing seems to make it better or worse, the patient has a significant heavy smoking history   Past Medical History  Diagnosis Date  . COPD (chronic obstructive pulmonary disease)     Severe emphysema, radiographically.  . Hypercholesterolemia   . S/P endoscopy March 2012    reflux esophagitis, no ulcerations  . S/P colonoscopy March 2012    normal  . Pulmonary nodule 07/04/2011    Past Surgical History  Procedure Laterality Date  . Cholecystectomy    . Appendectomy    . Right inguinal hernia repair      Family History  Problem Relation Age of Onset  . Cirrhosis Father     deceased, ETOH cirrhosis  . Stomach cancer      aunt    History  Substance Use Topics  . Smoking status: Current Every Day Smoker -- 0.50 packs/day     Types: Cigarettes  . Smokeless tobacco: Not on file  . Alcohol Use: 0.5 oz/week    1 drink(s) per week     Comment: occ      Review of Systems  Constitutional: Positive for fever.  Respiratory: Positive for cough and shortness of breath.   Gastrointestinal: Positive for nausea, vomiting and diarrhea.  All other systems reviewed and are negative.    Allergies  Codeine  Home Medications   Current Outpatient Rx  Name  Route  Sig  Dispense  Refill  . albuterol (PROVENTIL HFA;VENTOLIN HFA) 108 (90 BASE) MCG/ACT inhaler   Inhalation   Inhale 2 puffs into the lungs every 4 (four) hours as needed for wheezing or shortness of breath. Shortness of breath         . albuterol (PROVENTIL) (5 MG/ML) 0.5% nebulizer solution   Nebulization   Take 2.5 mg by nebulization every 4 (four) hours as needed for wheezing or shortness of breath.         Marland Kitchen aspirin 325 MG tablet   Oral   Take 975 mg by mouth every 6 (six) hours as needed for fever.         Marland Kitchen Dextromethorphan-Guaifenesin (ROBITUSSIN DM PO)   Oral   Take by mouth 3 (three) times daily as needed (for cough).  Takes one capful         . Fluticasone-Salmeterol (ADVAIR DISKUS) 250-50 MCG/DOSE AEPB   Inhalation   Inhale 1 puff into the lungs every 12 (twelve) hours.           Marland Kitchen HYDROcodone-acetaminophen (NORCO/VICODIN) 5-325 MG per tablet   Oral   Take 1 tablet by mouth every 4 (four) hours as needed for pain.         . pravastatin (PRAVACHOL) 20 MG tablet   Oral   Take 20 mg by mouth daily.          . Pseudoeph-Doxylamine-DM-APAP (NYQUIL) 60-7.07-06-998 MG/30ML LIQD   Oral   Take 30 mLs by mouth every 4 (four) hours as needed (cold symptoms).         Marland Kitchen albuterol (PROVENTIL) (5 MG/ML) 0.5% nebulizer solution   Nebulization   Take 0.5 mLs (2.5 mg total) by nebulization every 4 (four) hours as needed for wheezing or shortness of breath.   20 mL   12   . azithromycin (ZITHROMAX Z-PAK) 250 MG tablet   Oral    Take 1 tablet (250 mg total) by mouth daily. 500mg  PO day 1, then 250mg  PO days 205   6 tablet   0   . naproxen (NAPROSYN) 500 MG tablet   Oral   Take 1 tablet (500 mg total) by mouth 2 (two) times daily with a meal.   30 tablet   0   . predniSONE (DELTASONE) 20 MG tablet   Oral   Take 2 tablets (40 mg total) by mouth daily.   10 tablet   0     BP 132/85  Pulse 107  Temp(Src) 100 F (37.8 C) (Oral)  Resp 18  SpO2 93%  Physical Exam  Nursing note and vitals reviewed. Constitutional: He is oriented to person, place, and time. He appears distressed.  HENT:  Head: Normocephalic and atraumatic.  Mucous membranes dehydrated, very poor dentition, no asymmetry hypertrophy exudate or erythema of the posterior pharynx  Eyes: Conjunctivae and EOM are normal. Pupils are equal, round, and reactive to light. No scleral icterus.  Neck: Normal range of motion. Neck supple. No thyromegaly present.  Cardiovascular: Regular rhythm and intact distal pulses.   No murmur heard. Pulses palpable at the radial arteries and dorsalis pedis bilaterally, no JVD, distant heart sounds, no obvious murmur. Tachycardia present to 105  Pulmonary/Chest:  Slight increased work of breathing, no accessory muscle use, speaks in short sentences, decreased breath sounds in all quadrants, no rales, no wheezing  Abdominal: Soft. He exhibits no distension.  Musculoskeletal: Normal range of motion. He exhibits no edema and no tenderness.  Lymphadenopathy:    He has no cervical adenopathy.  Neurological: He is alert and oriented to person, place, and time.  Skin: Skin is warm and dry.  Psychiatric: He has a normal mood and affect. His behavior is normal.    ED Course  Procedures (including critical care time) DIAGNOSTIC STUDIES: Oxygen Saturation is 93% on room air, low by my interpretation.    COORDINATION OF CARE: 2:22 PM Discussed ED treatment with pt and pt agrees.  2:22 PM Ordered:  Medications   albuterol (PROVENTIL,VENTOLIN) solution continuous neb (not administered)  predniSONE (DELTASONE) tablet 60 mg (not administered)  levofloxacin (LEVAQUIN) IVPB 750 mg (not administered)  ketorolac (TORADOL) 30 MG/ML injection 30 mg (not administered)  sodium chloride 0.9 % bolus 1,000 mL (1,000 mLs Intravenous New Bag/Given 04/18/12 1443)  ondansetron (ZOFRAN) injection 4 mg (4  mg Intravenous Given 04/18/12 1443)  albuterol (PROVENTIL) (5 MG/ML) 0.5% nebulizer solution 5 mg (5 mg Nebulization Given 04/18/12 1551)  ipratropium (ATROVENT) nebulizer solution 0.5 mg (0.5 mg Nebulization Given 04/18/12 1551)       Labs Reviewed  CBC WITH DIFFERENTIAL - Abnormal; Notable for the following:    Monocytes Absolute 1.2 (*)    All other components within normal limits  BASIC METABOLIC PANEL - Abnormal; Notable for the following:    Chloride 95 (*)    Glucose, Bld 153 (*)    All other components within normal limits  TROPONIN I  PRO B NATRIURETIC PEPTIDE  LACTIC ACID, PLASMA  SALICYLATE LEVEL  URINALYSIS, ROUTINE W REFLEX MICROSCOPIC   Dg Chest 2 View  04/18/2012  *RADIOLOGY REPORT*  Clinical Data: Chest pain, emesis, diarrhea, fever  CHEST - 2 VIEW  Comparison: Prior chest x-ray 12/15/2011  Findings: Similar appearance of severe pulmonary hyperexpansion, chronic bronchial wall thickening and upper lobe predominant emphysematous changes.  Enlarged main central pulmonary arteries as can be seen with pulmonary arterial hypertension.  The cardiac and mediastinal contours are unchanged.  Atherosclerotic calcifications noted in the transverse aorta.  No focal airspace consolidation to suggest pneumonia.  No pulmonary edema, pleural effusion or pneumothorax.  Small dense nodule the periphery of the right lower lobe may reflect a nipple shadow or small calcified granuloma.  IMPRESSION:  1.  No acute cardiopulmonary disease. 2.  Severe COPD/emphysema. 3.  Enlarged main central pulmonary arteries as can be seen  with pulmonary arterial hypertension. 4.  Atherosclerosis.   Original Report Authenticated By: Malachy Moan, M.D.      1. COPD exacerbation    #2 respiratory distress   MDM  The patient has shortness of breath, subjective fevers and increased cough which is concerning for COPD exacerbation or pneumonia, EKG has been performed and shows sinus tachycardia but no other significant findings, compared with old EKG there is no significant changes other than rate.  Oral temperature of 100, blood pressure is normal the oxygen saturation is 93% on room air. We'll give supple no oxygen, cardiac monitoring, chest x-ray, labs to rule out sources such as pneumonia, doubt cardiac source. Infectious etiology more likely.   ED ECG REPORT  I personally interpreted this EKG   Date: 04/18/2012   Rate: 106  Rhythm: sinus tachycardia  QRS Axis: normal  Intervals: normal  ST/T Wave abnormalities: normal  Conduction Disutrbances:none  Narrative Interpretation:   Old EKG Reviewed: Compared with 12/15/2011, rate has increased  I personally interpreted the 2 view PA and lateral views of the chest x-ray, there is not appear to be any infiltrates, pneumothorax or widened mediastinum. He is barrel chested. He has normal blood work and his oxygen levels are above 90% murmur but he still has increased work of breathing. I have strongly recommended that the patient be admitted to the hospital for ongoing treatment of his COPD and his acute exacerbation which is causing significant dyspnea but he has refused and requested to go home with these medications. I have made a phone call to Dr. Juanetta Gosling office and Dr. Juanetta Gosling agrees to see the patient on Friday morning. All questions the patient has had been answered. The patient has expressed his understanding for the indications for return to the emergency department.  After the patient was evaluated several times and I had an elongation conversation with the patient  regarding his illness, has fever, his tachycardia and his increased work of breathing. I told him  that I felt that he needed medical admission because I felt that he was unstable, he declined, he was able to tell me all of the consequences of his actions including severe respiratory distress and even death and stated that he still wanted to home to continue his treatments at home. I again went into the room to discuss this with the patient and he still declined, he does have a normal mental status at this time and has medical decision capacity at this time. His significant other has also discussed this with him, he has been able to understand her encouragement and still declines admission to the hospital.  The patient has been furnished medications to continue his ongoing treatment including prednisone, antibiotics, albuterol treatments. He was in the middle of his continuous adolescent therapy when he decided to go home AGAINST MEDICAL ADVICE. Critical care is currently being delivered to the patient is declining further care in the hospital.  CRITICAL CARE Performed by: Cameron Villarreal   Total critical care time: 30  Critical care time was exclusive of separately billable procedures and treating other patients.  Critical care was necessary to treat or prevent imminent or life-threatening deterioration.  Critical care was time spent personally by me on the following activities: development of treatment plan with patient and/or surrogate as well as nursing, discussions with consultants, evaluation of patient's response to treatment, examination of patient, obtaining history from patient or surrogate, ordering and performing treatments and interventions, ordering and review of laboratory studies, ordering and review of radiographic studies, pulse oximetry and re-evaluation of patient's condition.   I personally performed the services described in this documentation, which was scribed in my presence.  The recorded information has been reviewed and is accurate.         Cameron Roller, MD 04/18/12 1612  Cameron Roller, MD 04/18/12 [phone]

## 2012-04-18 NOTE — ED Notes (Signed)
Pt c/o chest pain, n/v/d, and fever since Friday.

## 2013-04-04 ENCOUNTER — Emergency Department (HOSPITAL_COMMUNITY): Payer: Self-pay

## 2013-04-04 ENCOUNTER — Encounter (HOSPITAL_COMMUNITY): Payer: Self-pay | Admitting: Emergency Medicine

## 2013-04-04 ENCOUNTER — Emergency Department (HOSPITAL_COMMUNITY)
Admission: EM | Admit: 2013-04-04 | Discharge: 2013-04-04 | Disposition: A | Discharge status: Home / Self Care | Payer: Self-pay | Attending: Emergency Medicine | Admitting: Emergency Medicine

## 2013-04-04 DIAGNOSIS — Z9889 Other specified postprocedural states: Secondary | ICD-10-CM | POA: Insufficient documentation

## 2013-04-04 DIAGNOSIS — Z7982 Long term (current) use of aspirin: Secondary | ICD-10-CM | POA: Insufficient documentation

## 2013-04-04 DIAGNOSIS — R079 Chest pain, unspecified: Secondary | ICD-10-CM

## 2013-04-04 DIAGNOSIS — E7800 Pure hypercholesterolemia, unspecified: Secondary | ICD-10-CM | POA: Insufficient documentation

## 2013-04-04 DIAGNOSIS — J449 Chronic obstructive pulmonary disease, unspecified: Secondary | ICD-10-CM

## 2013-04-04 DIAGNOSIS — IMO0002 Reserved for concepts with insufficient information to code with codable children: Secondary | ICD-10-CM | POA: Insufficient documentation

## 2013-04-04 DIAGNOSIS — J40 Bronchitis, not specified as acute or chronic: Secondary | ICD-10-CM

## 2013-04-04 DIAGNOSIS — Z79899 Other long term (current) drug therapy: Secondary | ICD-10-CM | POA: Insufficient documentation

## 2013-04-04 DIAGNOSIS — Z792 Long term (current) use of antibiotics: Secondary | ICD-10-CM | POA: Insufficient documentation

## 2013-04-04 DIAGNOSIS — J209 Acute bronchitis, unspecified: Secondary | ICD-10-CM | POA: Insufficient documentation

## 2013-04-04 DIAGNOSIS — J440 Chronic obstructive pulmonary disease with acute lower respiratory infection: Secondary | ICD-10-CM | POA: Insufficient documentation

## 2013-04-04 DIAGNOSIS — F17200 Nicotine dependence, unspecified, uncomplicated: Secondary | ICD-10-CM | POA: Insufficient documentation

## 2013-04-04 LAB — CBC WITH DIFFERENTIAL/PLATELET
Basophils Absolute: 0 10*3/uL (ref 0.0–0.1)
Basophils Relative: 0 % (ref 0–1)
Eosinophils Absolute: 0.1 10*3/uL (ref 0.0–0.7)
Eosinophils Relative: 1 % (ref 0–5)
HEMATOCRIT: 46.1 % (ref 39.0–52.0)
HEMOGLOBIN: 15.2 g/dL (ref 13.0–17.0)
LYMPHS ABS: 1.8 10*3/uL (ref 0.7–4.0)
Lymphocytes Relative: 13 % (ref 12–46)
MCH: 29.9 pg (ref 26.0–34.0)
MCHC: 33 g/dL (ref 30.0–36.0)
MCV: 90.6 fL (ref 78.0–100.0)
MONO ABS: 1.7 10*3/uL — AB (ref 0.1–1.0)
MONOS PCT: 12 % (ref 3–12)
NEUTROS ABS: 10.2 10*3/uL — AB (ref 1.7–7.7)
NEUTROS PCT: 74 % (ref 43–77)
Platelets: 290 10*3/uL (ref 150–400)
RBC: 5.09 MIL/uL (ref 4.22–5.81)
RDW: 14.1 % (ref 11.5–15.5)
WBC: 13.8 10*3/uL — ABNORMAL HIGH (ref 4.0–10.5)

## 2013-04-04 LAB — BASIC METABOLIC PANEL
BUN: 9 mg/dL (ref 6–23)
CO2: 29 meq/L (ref 19–32)
Calcium: 9 mg/dL (ref 8.4–10.5)
Chloride: 94 mEq/L — ABNORMAL LOW (ref 96–112)
Creatinine, Ser: 0.68 mg/dL (ref 0.50–1.35)
GFR calc Af Amer: >90 mL/min (ref >90)
GFR calc non Af Amer: >90 mL/min (ref >90)
GLUCOSE: 111 mg/dL — AB (ref 70–99)
POTASSIUM: 4.4 meq/L (ref 3.7–5.3)
SODIUM: 133 meq/L — AB (ref 137–147)

## 2013-04-04 LAB — D-DIMER, QUANTITATIVE: D-Dimer, Quant: 9.41 ug/mL-FEU — ABNORMAL HIGH (ref 0.00–0.48)

## 2013-04-04 LAB — TROPONIN I: Troponin I: <0.3 ng/mL (ref <0.30)

## 2013-04-04 MED ORDER — PREDNISONE 10 MG PO TABS: 20.0000 mg | ORAL_TABLET | Freq: Every day | ORAL | Status: DC

## 2013-04-04 MED ORDER — METHYLPREDNISOLONE SODIUM SUCC 125 MG IJ SOLR
125.0000 mg | Freq: Once | INTRAMUSCULAR | Status: AC
  Administered 2013-04-04: 125 mg via INTRAVENOUS
  Filled 2013-04-04: qty 2

## 2013-04-04 MED ORDER — IPRATROPIUM-ALBUTEROL 0.5-2.5 (3) MG/3ML IN SOLN
3.0000 mL | Freq: Once | RESPIRATORY_TRACT | Status: AC
  Administered 2013-04-04: 3 mL via RESPIRATORY_TRACT
  Filled 2013-04-04: qty 3

## 2013-04-04 MED ORDER — ONDANSETRON HCL 4 MG/2ML IJ SOLN
4.0000 mg | Freq: Once | INTRAMUSCULAR | Status: AC
  Administered 2013-04-04: 4 mg via INTRAVENOUS
  Filled 2013-04-04: qty 2

## 2013-04-04 MED ORDER — HYDROMORPHONE HCL PF 1 MG/ML IJ SOLN
1.0000 mg | Freq: Once | INTRAMUSCULAR | Status: AC
  Administered 2013-04-04: 1 mg via INTRAVENOUS
  Filled 2013-04-04: qty 1

## 2013-04-04 MED ORDER — AMOXICILLIN 500 MG PO CAPS: 500.0000 mg | ORAL_CAPSULE | Freq: Three times a day (TID) | ORAL | Status: DC

## 2013-04-04 MED ORDER — OXYCODONE-ACETAMINOPHEN 5-325 MG PO TABS
ORAL_TABLET | ORAL | Status: AC
  Filled 2013-04-04: qty 1

## 2013-04-04 MED ORDER — IOHEXOL 350 MG/ML SOLN
100.0000 mL | Freq: Once | INTRAVENOUS | Status: AC | PRN
  Administered 2013-04-04: 100 mL via INTRAVENOUS

## 2013-04-04 MED ORDER — OXYCODONE-ACETAMINOPHEN 5-325 MG PO TABS
1.0000 | ORAL_TABLET | Freq: Once | ORAL | Status: AC
  Administered 2013-04-04: 1 via ORAL
  Filled 2013-04-04: qty 1

## 2013-04-04 MED ORDER — OXYCODONE HCL 5 MG PO TABS: 5.0000 mg | ORAL_TABLET | Freq: Four times a day (QID) | ORAL | Status: DC | PRN

## 2013-04-04 MED ORDER — ALBUTEROL SULFATE (2.5 MG/3ML) 0.083% IN NEBU
2.5000 mg | INHALATION_SOLUTION | Freq: Once | RESPIRATORY_TRACT | Status: AC
  Administered 2013-04-04: 2.5 mg via RESPIRATORY_TRACT
  Filled 2013-04-04: qty 3

## 2013-04-04 MED ORDER — SODIUM CHLORIDE 0.9 % IJ SOLN
INTRAMUSCULAR | Status: AC
  Filled 2013-04-04: qty 500

## 2013-04-04 NOTE — ED Provider Notes (Signed)
CSN: [id number]     Arrival date & time 04/04/13  1240 History   First MD Initiated Contact with Patient 04/04/13 1300     Chief Complaint  Patient presents with  . Chest Pain     (Consider location/radiation/quality/duration/timing/severity/associated sxs/prior Treatment) Patient is a 48 y.o. male presenting with chest pain. The history is provided by the patient (the pt complains of some chest pain today.  he is out of his pain meds now).  Chest Pain Pain location:  L chest Pain quality: aching   Pain radiates to:  Does not radiate Pain radiates to the back: no   Pain severity:  Moderate Onset quality:  Gradual Timing:  Constant Chronicity:  Recurrent Associated symptoms: no abdominal pain, no back pain, no cough, no fatigue and no headache     Past Medical History  Diagnosis Date  . COPD (chronic obstructive pulmonary disease)     Severe emphysema, radiographically.  . Hypercholesterolemia   . S/P endoscopy March 2012    reflux esophagitis, no ulcerations  . S/P colonoscopy March 2012    normal  . Pulmonary nodule 07/04/2011   Past Surgical History  Procedure Laterality Date  . Cholecystectomy    . Appendectomy    . Right inguinal hernia repair    . Hernia repair    . Knee surgery     Family History  Problem Relation Age of Onset  . Cirrhosis Father     deceased, ETOH cirrhosis  . Stomach cancer      aunt   History  Substance Use Topics  . Smoking status: Current Every Day Smoker -- 0.50 packs/day    Types: Cigarettes  . Smokeless tobacco: Not on file  . Alcohol Use: 0.5 oz/week    1 drink(s) per week     Comment: occ    Review of Systems  Constitutional: Negative for appetite change and fatigue.  HENT: Negative for congestion, ear discharge and sinus pressure.   Eyes: Negative for discharge.  Respiratory: Negative for cough.   Cardiovascular: Positive for chest pain.  Gastrointestinal: Negative for abdominal pain and diarrhea.  Genitourinary:  Negative for frequency and hematuria.  Musculoskeletal: Negative for back pain.  Skin: Negative for rash.  Neurological: Negative for seizures and headaches.  Psychiatric/Behavioral: Negative for hallucinations.      Allergies  Codeine  Home Medications   Current Outpatient Rx  Name  Route  Sig  Dispense  Refill  . ALPRAZolam (XANAX) 1 MG tablet   Oral   Take 1 mg by mouth 3 (three) times daily.         Marland Kitchen aspirin EC 81 MG tablet   Oral   Take 81 mg by mouth daily.         . Fluticasone-Salmeterol (ADVAIR DISKUS) 250-50 MCG/DOSE AEPB   Inhalation   Inhale 1 puff into the lungs every 12 (twelve) hours.           Marland Kitchen oxyCODONE (OXY IR/ROXICODONE) 5 MG immediate release tablet   Oral   Take 10 mg by mouth every 4 (four) hours as needed for severe pain.         Marland Kitchen albuterol (PROVENTIL) (5 MG/ML) 0.5% nebulizer solution   Nebulization   Take 0.5 mLs (2.5 mg total) by nebulization every 4 (four) hours as needed for wheezing or shortness of breath.   20 mL   12   . amoxicillin (AMOXIL) 500 MG capsule   Oral   Take 1 capsule (500 mg total)  by mouth 3 (three) times daily.   21 capsule   0   . oxyCODONE (ROXICODONE) 5 MG immediate release tablet   Oral   Take 1 tablet (5 mg total) by mouth every 6 (six) hours as needed for severe pain.   20 tablet   0   . predniSONE (DELTASONE) 10 MG tablet   Oral   Take 2 tablets (20 mg total) by mouth daily.   14 tablet   0    BP 107/75  Pulse 103  Temp(Src) 98.4 F (36.9 C) (Oral)  Resp 20  Ht 5\' 8"  (1.727 m)  Wt 135 lb (61.236 kg)  BMI 20.53 kg/m2  SpO2 94% Physical Exam  Constitutional: He is oriented to person, place, and time. He appears well-developed.  HENT:  Head: Normocephalic.  Eyes: Conjunctivae and EOM are normal. No scleral icterus.  Neck: Neck supple. No thyromegaly present.  Cardiovascular: Normal rate and regular rhythm.  Exam reveals no gallop and no friction rub.   No murmur  heard. Pulmonary/Chest: No stridor. He has no wheezes. He has no rales. He exhibits no tenderness.  Abdominal: He exhibits no distension. There is no tenderness. There is no rebound.  Musculoskeletal: Normal range of motion. He exhibits no edema.  Lymphadenopathy:    He has no cervical adenopathy.  Neurological: He is oriented to person, place, and time. He exhibits normal muscle tone. Coordination normal.  Skin: No rash noted. No erythema.  Psychiatric: He has a normal mood and affect. His behavior is normal.    ED Course  Procedures (including critical care time) Labs Review Labs Reviewed  CBC WITH DIFFERENTIAL - Abnormal; Notable for the following:    WBC 13.8 (*)    Neutro Abs 10.2 (*)    Monocytes Absolute 1.7 (*)    All other components within normal limits  BASIC METABOLIC PANEL - Abnormal; Notable for the following:    Sodium 133 (*)    Chloride 94 (*)    Glucose, Bld 111 (*)    All other components within normal limits  D-DIMER, QUANTITATIVE - Abnormal; Notable for the following:    D-Dimer, Quant 9.41 (*)    All other components within normal limits  TROPONIN I   Imaging Review Dg Chest 2 View  04/04/2013   CLINICAL DATA:  Chest pain since this morning  EXAM: CHEST  2 VIEW  COMPARISON:  DG CHEST 2 VIEW dated 04/18/2012  FINDINGS: Hyperinflation consistent with COPD, stable. Heart size and vascular pattern normal. No consolidation effusion or pneumothorax.  IMPRESSION: COPD with no acute findings   Electronically Signed   By: Skipper Cliche M.D.   On: 04/04/2013 13:48   Ct Angio Chest Pe W/cm &/or Wo Cm  04/04/2013   CLINICAL DATA:  Chest tightness and shortness of breath. History of COPD.  EXAM: CT ANGIOGRAPHY CHEST WITH CONTRAST  TECHNIQUE: Multidetector CT imaging of the chest was performed using the standard protocol during bolus administration of intravenous contrast. Multiplanar CT image reconstructions and MIPs were obtained to evaluate the vascular anatomy.   CONTRAST:  100 mL OMNIPAQUE IOHEXOL 350 MG/ML SOLN  COMPARISON:  CT chest 06/21/2011. PA and lateral chest earlier this same day.  FINDINGS: No pulmonary embolus is identified. Heart size is normal. There is no pleural or pericardial effusion. Small Bochdalek's hernia on the right is incidentally noted. No axillary, hilar or mediastinal lymphadenopathy. The lungs demonstrate severe centrilobular emphysema. Mild dependent atelectasis is seen. Previously seen 0.4 cm left upper  lobe nodule is not appreciated on this exam. No consolidative process, and nodule or mass. No pneumothorax. Visualized upper abdomen is unremarkable. No focal bony abnormality.  Review of the MIP images confirms the above findings.  IMPRESSION: Negative for pulmonary embolus or acute disease.  Severe centrilobular emphysema.   Electronically Signed   By: Inge Rise M.D.   On: 04/04/2013 16:28    EKG Interpretation   None      Date: 04/04/2013  Rate: 98  Rhythm: normal sinus rhythm  QRS Axis: normal  Intervals: normal  ST/T Wave abnormalities: nonspecific ST changes  Conduction Disutrbances:none  Narrative Interpretation:   Old EKG Reviewed: none available    MDM   Final diagnoses:  Chest pain  COPD (chronic obstructive pulmonary disease)  Bronchitis    Chest wall pain    Maudry Diego, MD 04/04/13 1723

## 2013-04-04 NOTE — ED Notes (Signed)
nad noted prior to dc.  Dc instructions reviewed and explained. Pt voiced understanding. Speaks complete sentences without difficulty.

## 2013-04-04 NOTE — ED Notes (Signed)
Chest pain, onset 930 am , with sob

## 2013-04-04 NOTE — Discharge Instructions (Signed)
Follow up with your md next week. °

## 2013-06-22 ENCOUNTER — Encounter (HOSPITAL_COMMUNITY): Payer: Self-pay | Admitting: Emergency Medicine

## 2013-06-22 ENCOUNTER — Inpatient Hospital Stay (HOSPITAL_COMMUNITY): Payer: Self-pay

## 2013-06-22 ENCOUNTER — Emergency Department (HOSPITAL_COMMUNITY): Payer: Self-pay

## 2013-06-22 ENCOUNTER — Inpatient Hospital Stay (HOSPITAL_COMMUNITY)
Admission: EM | Admit: 2013-06-22 | Discharge: 2013-06-25 | DRG: 193 | Disposition: A | Discharge status: Home / Self Care | Payer: Self-pay | Attending: Pulmonary Disease | Admitting: Pulmonary Disease

## 2013-06-22 DIAGNOSIS — E871 Hypo-osmolality and hyponatremia: Secondary | ICD-10-CM | POA: Diagnosis present

## 2013-06-22 DIAGNOSIS — M549 Dorsalgia, unspecified: Secondary | ICD-10-CM | POA: Diagnosis present

## 2013-06-22 DIAGNOSIS — F17200 Nicotine dependence, unspecified, uncomplicated: Secondary | ICD-10-CM | POA: Diagnosis present

## 2013-06-22 DIAGNOSIS — Z23 Encounter for immunization: Secondary | ICD-10-CM

## 2013-06-22 DIAGNOSIS — Z800 Family history of malignant neoplasm of digestive organs: Secondary | ICD-10-CM

## 2013-06-22 DIAGNOSIS — J189 Pneumonia, unspecified organism: Principal | ICD-10-CM | POA: Diagnosis present

## 2013-06-22 DIAGNOSIS — J9600 Acute respiratory failure, unspecified whether with hypoxia or hypercapnia: Secondary | ICD-10-CM | POA: Diagnosis present

## 2013-06-22 DIAGNOSIS — G8929 Other chronic pain: Secondary | ICD-10-CM | POA: Diagnosis present

## 2013-06-22 DIAGNOSIS — J441 Chronic obstructive pulmonary disease with (acute) exacerbation: Secondary | ICD-10-CM | POA: Diagnosis present

## 2013-06-22 DIAGNOSIS — J449 Chronic obstructive pulmonary disease, unspecified: Secondary | ICD-10-CM

## 2013-06-22 DIAGNOSIS — F411 Generalized anxiety disorder: Secondary | ICD-10-CM | POA: Diagnosis present

## 2013-06-22 DIAGNOSIS — E7800 Pure hypercholesterolemia, unspecified: Secondary | ICD-10-CM | POA: Diagnosis present

## 2013-06-22 LAB — CBC WITH DIFFERENTIAL/PLATELET
BASOS ABS: 0 10*3/uL (ref 0.0–0.1)
Basophils Relative: 0 % (ref 0–1)
Eosinophils Absolute: 0 10*3/uL (ref 0.0–0.7)
Eosinophils Relative: 0 % (ref 0–5)
HEMATOCRIT: 48.5 % (ref 39.0–52.0)
Hemoglobin: 16.2 g/dL (ref 13.0–17.0)
LYMPHS PCT: 11 % — AB (ref 12–46)
Lymphs Abs: 2.3 10*3/uL (ref 0.7–4.0)
MCH: 29.8 pg (ref 26.0–34.0)
MCHC: 33.4 g/dL (ref 30.0–36.0)
MCV: 89.2 fL (ref 78.0–100.0)
MONOS PCT: 12 % (ref 3–12)
Monocytes Absolute: 2.5 10*3/uL — ABNORMAL HIGH (ref 0.1–1.0)
NEUTROS ABS: 15.7 10*3/uL — AB (ref 1.7–7.7)
Neutrophils Relative %: 77 % (ref 43–77)
PLATELETS: 322 10*3/uL (ref 150–400)
RBC: 5.44 MIL/uL (ref 4.22–5.81)
RDW: 14 % (ref 11.5–15.5)
WBC: 20.5 10*3/uL — AB (ref 4.0–10.5)

## 2013-06-22 LAB — LACTIC ACID, PLASMA: Lactic Acid, Venous: 1.2 mmol/L (ref 0.5–2.2)

## 2013-06-22 LAB — BLOOD GAS, ARTERIAL
ACID-BASE EXCESS: 0.8 mmol/L (ref 0.0–2.0)
ACID-BASE EXCESS: 1.7 mmol/L (ref 0.0–2.0)
Bicarbonate: 24.8 mEq/L — ABNORMAL HIGH (ref 20.0–24.0)
Bicarbonate: 25.4 mEq/L — ABNORMAL HIGH (ref 20.0–24.0)
Delivery systems: POSITIVE
Delivery systems: POSITIVE
Drawn by: 23534
Drawn by: 27407
Expiratory PAP: 5
Expiratory PAP: 5
FIO2: 0.4 %
FIO2: 0.3 %
Inspiratory PAP: 15
Inspiratory PAP: 15
O2 CONTENT: 40 L/min
O2 SAT: 97.8 %
O2 SAT: 97.7 %
PATIENT TEMPERATURE: 37
PO2 ART: 103 mmHg — AB (ref 80.0–100.0)
Patient temperature: 37
TCO2: 21.5 mmol/L (ref 0–100)
TCO2: 21.9 mmol/L (ref 0–100)
pCO2 arterial: 39 mmHg (ref 35.0–45.0)
pCO2 arterial: 37.4 mmHg (ref 35.0–45.0)
pH, Arterial: 7.419 (ref 7.350–7.450)
pH, Arterial: 7.447 (ref 7.350–7.450)
pO2, Arterial: 96.5 mmHg (ref 80.0–100.0)

## 2013-06-22 LAB — COMPREHENSIVE METABOLIC PANEL
ALBUMIN: 3.7 g/dL (ref 3.5–5.2)
ALK PHOS: 117 U/L (ref 39–117)
ALT: 15 U/L (ref 0–53)
AST: 20 U/L (ref 0–37)
BUN: 6 mg/dL (ref 6–23)
CHLORIDE: 91 meq/L — AB (ref 96–112)
CO2: 28 meq/L (ref 19–32)
Calcium: 9.8 mg/dL (ref 8.4–10.5)
Creatinine, Ser: 0.83 mg/dL (ref 0.50–1.35)
GFR calc Af Amer: >90 mL/min (ref >90)
GFR calc non Af Amer: >90 mL/min (ref >90)
Glucose, Bld: 128 mg/dL — ABNORMAL HIGH (ref 70–99)
POTASSIUM: 4.4 meq/L (ref 3.7–5.3)
Sodium: 132 mEq/L — ABNORMAL LOW (ref 137–147)
Total Bilirubin: 0.9 mg/dL (ref 0.3–1.2)
Total Protein: 8.6 g/dL — ABNORMAL HIGH (ref 6.0–8.3)

## 2013-06-22 LAB — MRSA PCR SCREENING: MRSA BY PCR: NEGATIVE

## 2013-06-22 LAB — PROCALCITONIN: PROCALCITONIN: 0.1 ng/mL

## 2013-06-22 LAB — TROPONIN I: Troponin I: <0.3 ng/mL (ref <0.30)

## 2013-06-22 MED ORDER — DEXTROSE 5 % IV SOLN
500.0000 mg | INTRAVENOUS | Status: DC
  Administered 2013-06-23 – 2013-06-25 (×3): 500 mg via INTRAVENOUS
  Filled 2013-06-22 (×3): qty 500

## 2013-06-22 MED ORDER — SODIUM CHLORIDE 0.9 % IV SOLN
INTRAVENOUS | Status: DC
  Administered 2013-06-22 – 2013-06-24 (×7): via INTRAVENOUS

## 2013-06-22 MED ORDER — ALPRAZOLAM 0.5 MG PO TABS
1.0000 mg | ORAL_TABLET | Freq: Three times a day (TID) | ORAL | Status: DC | PRN
  Administered 2013-06-22 – 2013-06-25 (×7): 1 mg via ORAL
  Filled 2013-06-22 (×7): qty 2

## 2013-06-22 MED ORDER — METHYLPREDNISOLONE SODIUM SUCC 40 MG IJ SOLR
40.0000 mg | Freq: Two times a day (BID) | INTRAMUSCULAR | Status: DC
  Administered 2013-06-22 – 2013-06-25 (×6): 40 mg via INTRAVENOUS
  Filled 2013-06-22 (×6): qty 1

## 2013-06-22 MED ORDER — IPRATROPIUM BROMIDE 0.02 % IN SOLN: 0.5000 mg | RESPIRATORY_TRACT | Status: DC

## 2013-06-22 MED ORDER — ONDANSETRON HCL 4 MG/2ML IJ SOLN: 4.0000 mg | Freq: Four times a day (QID) | INTRAMUSCULAR | Status: DC | PRN

## 2013-06-22 MED ORDER — PNEUMOCOCCAL VAC POLYVALENT 25 MCG/0.5ML IJ INJ
0.5000 mL | INJECTION | INTRAMUSCULAR | Status: AC
  Administered 2013-06-23: 0.5 mL via INTRAMUSCULAR
  Filled 2013-06-22: qty 0.5

## 2013-06-22 MED ORDER — DEXTROSE 5 % IV SOLN
500.0000 mg | Freq: Once | INTRAVENOUS | Status: AC
  Administered 2013-06-22: 500 mg via INTRAVENOUS

## 2013-06-22 MED ORDER — ACETAMINOPHEN 650 MG RE SUPP
650.0000 mg | Freq: Four times a day (QID) | RECTAL | Status: DC | PRN
Start: 2013-06-22 — End: 2013-06-25

## 2013-06-22 MED ORDER — DEXTROSE 5 % IV SOLN
1.0000 g | INTRAVENOUS | Status: DC
  Administered 2013-06-22: 1 g via INTRAVENOUS
  Filled 2013-06-22: qty 10

## 2013-06-22 MED ORDER — ASPIRIN EC 81 MG PO TBEC
81.0000 mg | DELAYED_RELEASE_TABLET | Freq: Every day | ORAL | Status: DC
  Administered 2013-06-22 – 2013-06-25 (×4): 81 mg via ORAL
  Filled 2013-06-22 (×4): qty 1

## 2013-06-22 MED ORDER — MORPHINE SULFATE 2 MG/ML IJ SOLN
1.0000 mg | INTRAMUSCULAR | Status: DC | PRN
  Administered 2013-06-22 – 2013-06-23 (×4): 1 mg via INTRAVENOUS
  Filled 2013-06-22 (×4): qty 1

## 2013-06-22 MED ORDER — DEXTROSE 5 % IV SOLN: 2.0000 g | INTRAVENOUS | Status: DC

## 2013-06-22 MED ORDER — PIPERACILLIN-TAZOBACTAM 3.375 G IVPB
3.3750 g | Freq: Three times a day (TID) | INTRAVENOUS | Status: DC
  Administered 2013-06-22 – 2013-06-25 (×9): 3.375 g via INTRAVENOUS
  Filled 2013-06-22 (×9): qty 50

## 2013-06-22 MED ORDER — ACETAMINOPHEN 325 MG PO TABS
650.0000 mg | ORAL_TABLET | Freq: Four times a day (QID) | ORAL | Status: DC | PRN
Start: 2013-06-22 — End: 2013-06-25

## 2013-06-22 MED ORDER — ONDANSETRON HCL 4 MG PO TABS: 4.0000 mg | ORAL_TABLET | Freq: Four times a day (QID) | ORAL | Status: DC | PRN

## 2013-06-22 MED ORDER — DEXTROSE 5 % IV SOLN
1.0000 g | Freq: Once | INTRAVENOUS | Status: AC
  Administered 2013-06-22: 1 g via INTRAVENOUS
  Filled 2013-06-22: qty 10

## 2013-06-22 MED ORDER — ALBUTEROL SULFATE (2.5 MG/3ML) 0.083% IN NEBU: 2.5000 mg | INHALATION_SOLUTION | RESPIRATORY_TRACT | Status: DC

## 2013-06-22 MED ORDER — SODIUM CHLORIDE 0.9 % IV BOLUS (SEPSIS)
500.0000 mL | Freq: Once | INTRAVENOUS | Status: AC
  Administered 2013-06-22: 500 mL via INTRAVENOUS

## 2013-06-22 MED ORDER — PIPERACILLIN-TAZOBACTAM 3.375 G IVPB: 3.3750 g | Freq: Three times a day (TID) | INTRAVENOUS | Status: DC

## 2013-06-22 MED ORDER — ENOXAPARIN SODIUM 40 MG/0.4ML ~~LOC~~ SOLN
40.0000 mg | SUBCUTANEOUS | Status: DC
  Administered 2013-06-22 – 2013-06-24 (×3): 40 mg via SUBCUTANEOUS
  Filled 2013-06-22 (×3): qty 0.4

## 2013-06-22 MED ORDER — METHYLPREDNISOLONE SODIUM SUCC 125 MG IJ SOLR
125.0000 mg | Freq: Once | INTRAMUSCULAR | Status: AC
  Administered 2013-06-22: 125 mg via INTRAVENOUS
  Filled 2013-06-22: qty 2

## 2013-06-22 MED ORDER — BIOTENE DRY MOUTH MT LIQD
15.0000 mL | Freq: Two times a day (BID) | OROMUCOSAL | Status: DC
  Administered 2013-06-22: 15 mL via OROMUCOSAL

## 2013-06-22 MED ORDER — ALBUTEROL SULFATE (2.5 MG/3ML) 0.083% IN NEBU
2.5000 mg | INHALATION_SOLUTION | Freq: Once | RESPIRATORY_TRACT | Status: AC
  Administered 2013-06-22: 2.5 mg via RESPIRATORY_TRACT
  Filled 2013-06-22: qty 3

## 2013-06-22 MED ORDER — IPRATROPIUM-ALBUTEROL 0.5-2.5 (3) MG/3ML IN SOLN
3.0000 mL | RESPIRATORY_TRACT | Status: DC
  Administered 2013-06-22 – 2013-06-25 (×16): 3 mL via RESPIRATORY_TRACT
  Filled 2013-06-22 (×16): qty 3

## 2013-06-22 MED ORDER — LORAZEPAM 2 MG/ML IJ SOLN
0.5000 mg | Freq: Once | INTRAMUSCULAR | Status: AC
  Administered 2013-06-22: 0.5 mg via INTRAVENOUS
  Filled 2013-06-22: qty 1

## 2013-06-22 MED ORDER — IPRATROPIUM-ALBUTEROL 0.5-2.5 (3) MG/3ML IN SOLN
3.0000 mL | Freq: Once | RESPIRATORY_TRACT | Status: AC
  Administered 2013-06-22: 3 mL via RESPIRATORY_TRACT
  Filled 2013-06-22: qty 3

## 2013-06-22 MED ORDER — CHLORHEXIDINE GLUCONATE 0.12 % MT SOLN
15.0000 mL | Freq: Two times a day (BID) | OROMUCOSAL | Status: DC
  Administered 2013-06-22: 15 mL via OROMUCOSAL
  Filled 2013-06-22: qty 15

## 2013-06-22 MED ORDER — ACETAMINOPHEN 325 MG PO TABS
650.0000 mg | ORAL_TABLET | Freq: Once | ORAL | Status: AC
  Administered 2013-06-22: 650 mg via ORAL
  Filled 2013-06-22: qty 2

## 2013-06-22 NOTE — ED Provider Notes (Signed)
CSN: [id number]     Arrival date & time 06/22/13  6283 History   First MD Initiated Contact with Patient 06/22/13 [phone]     Chief Complaint  Patient presents with  . Shortness of Breath  . Hemoptysis   Patient is a 48 y.o. male presenting with shortness of breath.  Shortness of Breath  This chart was scribed for [name], MD by Thea Alken, ED Scribe. This patient was seen in room APA06/APA06 and the patient's care was started at 9:35 AM.  HPI Comments:  This is a level 5 caveat. CHEVY SWEIGERT is a 48 y.o. with COPD male who present to the Emergency Department complaining of trouble breathing with associated SOB onset last night. Patients breathing has progressively worsened since last night. Pt reports right lung is worse than left. Pt uses breathing machine at home. He denies using oxygen at home. Pt reports he is no longer smoking and that he quit 2 days ago.  Past Medical History  Diagnosis Date  . COPD (chronic obstructive pulmonary disease)     Severe emphysema, radiographically.  . Hypercholesterolemia   . S/P endoscopy March 2012    reflux esophagitis, no ulcerations  . S/P colonoscopy March 2012    normal  . Pulmonary nodule 07/04/2011   Past Surgical History  Procedure Laterality Date  . Cholecystectomy    . Appendectomy    . Right inguinal hernia repair    . Hernia repair    . Knee surgery     Family History  Problem Relation Age of Onset  . Cirrhosis Father     deceased, ETOH cirrhosis  . Stomach cancer      aunt   History  Substance Use Topics  . Smoking status: Current Every Day Smoker -- 0.50 packs/day for 30 years    Types: Cigarettes  . Smokeless tobacco: Never Used  . Alcohol Use: 0.5 oz/week    1 drink(s) per week     Comment: occ    Review of Systems  Unable to perform ROS: Severe respiratory distress  Respiratory: Positive for shortness of breath.    Allergies  Codeine  Home Medications   Prior to Admission medications   Medication Sig  Start Date End Date Taking? Authorizing Provider  albuterol (PROVENTIL) (5 MG/ML) 0.5% nebulizer solution Take 0.5 mLs (2.5 mg total) by nebulization every 4 (four) hours as needed for wheezing or shortness of breath. 04/18/12   Johnna Acosta, MD  ALPRAZolam Duanne Moron) 1 MG tablet Take 1 mg by mouth 3 (three) times daily.    Historical Provider, MD  amoxicillin (AMOXIL) 500 MG capsule Take 1 capsule (500 mg total) by mouth 3 (three) times daily. 04/04/13   Maudry Diego, MD  aspirin EC 81 MG tablet Take 81 mg by mouth daily.    Historical Provider, MD  Fluticasone-Salmeterol (ADVAIR DISKUS) 250-50 MCG/DOSE AEPB Inhale 1 puff into the lungs every 12 (twelve) hours.      Historical Provider, MD  oxyCODONE (OXY IR/ROXICODONE) 5 MG immediate release tablet Take 10 mg by mouth every 4 (four) hours as needed for severe pain.    Historical Provider, MD  oxyCODONE (ROXICODONE) 5 MG immediate release tablet Take 1 tablet (5 mg total) by mouth every 6 (six) hours as needed for severe pain. 04/04/13   Maudry Diego, MD  predniSONE (DELTASONE) 10 MG tablet Take 2 tablets (20 mg total) by mouth daily. 04/04/13   Maudry Diego, MD   BP 138/81  Pulse 122  Temp(Src) 101.3 F (38.5 C) (Oral)  Resp 36  Ht 5\' 8"  (1.727 m)  Wt 138 lb (62.596 kg)  BMI 20.99 kg/m2  SpO2 90% Physical Exam  Nursing note and vitals reviewed. Constitutional: He is oriented to person, place, and time. He appears well-developed and well-nourished.  HENT:  Head: Normocephalic and atraumatic.  Eyes: Conjunctivae and EOM are normal. Pupils are equal, round, and reactive to light.  Neck: Normal range of motion. Neck supple.  Cardiovascular: Normal rate, regular rhythm and normal heart sounds.   Pulmonary/Chest: He is in respiratory distress. He has wheezes.  Pt appears disomic and febrile. Pt is not moving air well  Abdominal: Soft. Bowel sounds are normal.  Musculoskeletal: Normal range of motion.  Neurological: He is alert and  oriented to person, place, and time.  Skin: Skin is warm and dry.  Psychiatric: He has a normal mood and affect. His behavior is normal.    ED Course  Procedures  DIAGNOSTIC STUDIES: Oxygen Saturation is 90% on RA, normal by my interpretation.    COORDINATION OF CARE: 9:39 AM- Pt will be admitted into hospital. Discussed treatment plan which includes breathing treatment, antibiotics, CXR and CBC. .     Labs Review Labs Reviewed  CBC WITH DIFFERENTIAL - Abnormal; Notable for the following:    WBC 20.5 (*)    Lymphocytes Relative 11 (*)    Neutro Abs 15.7 (*)    Monocytes Absolute 2.5 (*)    All other components within normal limits  COMPREHENSIVE METABOLIC PANEL - Abnormal; Notable for the following:    Sodium 132 (*)    Chloride 91 (*)    Glucose, Bld 128 (*)    Total Protein 8.6 (*)    All other components within normal limits  BLOOD GAS, ARTERIAL - Abnormal; Notable for the following:    pO2, Arterial 103.0 (*)    Bicarbonate 24.8 (*)    All other components within normal limits  CULTURE, BLOOD (ROUTINE X 2)  CULTURE, BLOOD (ROUTINE X 2)  TROPONIN I  LACTIC ACID, PLASMA   Imaging Review Dg Chest Portable 1 View  06/22/2013   CLINICAL DATA:  Shortness of breath and hemoptysis  EXAM: PORTABLE CHEST - 1 VIEW  COMPARISON:  Chest CT and chest radiograph April 04, 2013  FINDINGS: There is underlying emphysema. There is consolidation in the right lower lobe with focal interstitial thickening throughout much of the right upper lobe is well. Elsewhere lungs are clear. Heart size is normal. Pulmonary vascularity reflects underlying emphysema. No adenopathy is apparent.  IMPRESSION: Right lower lobe consolidation with interstitial thickening, probably due to a combination of interstitial and airspace pneumonitis. There is underlying emphysema. Lungs elsewhere are clear.  With respect to the opacity in the right lower lobe, the possibility of underlying neoplasm must be of concern,  although given the degree of change since CT from February 2015, pneumonia isthmus likely. Would advise followup of study in 7-10 days to assess for clearing. If opacity has not shown clearing in that time interval, correlation with chest CT at that time would be advised to further assess.   Electronically Signed   By: Lowella Grip M.D.   On: 06/22/2013 09:58     EKG Interpretation   Date/Time:  Saturday Jun 22 2013 09:33:29 EDT Ventricular Rate:  116 PR Interval:  128 QRS Duration: 89 QT Interval:  313 QTC Calculation: 435 R Axis:   89 Text Interpretation:  Sinus tachycardia Baseline wander  in lead(s) V5  Confirmed by Lacinda Axon  MD, [place] ([id number]) on 06/22/2013 9:38:29 AM     CRITICAL CARE Performed by: [name] Total critical care time: 40 Critical care time was exclusive of separately billable procedures and treating other patients. Critical care was necessary to treat or prevent imminent or life-threatening deterioration. Critical care was time spent personally by me on the following activities: development of treatment plan with patient and/or surrogate as well as nursing, discussions with consultants, evaluation of patient's response to treatment, examination of patient, obtaining history from patient or surrogate, ordering and performing treatments and interventions, ordering and review of laboratory studies, ordering and review of radiographic studies, pulse oximetry and re-evaluation of patient's condition. MDM   Final diagnoses:  Community acquired pneumonia  COPD (chronic obstructive pulmonary disease)     Chest x-ray suggests right lower lobe pneumonia. Patient was started on BiPAP immediately.   Albuterol Atrovent breathing treatments.   IV steroids, IV Rocephin, IV Zithromax.   Discussed with pulmonary critical care specialist at [facility].   If blood gas was reasonably normal, he felt patient could stay at [facility] for his admission.  Discussed with hospitalist      [name], MD 06/22/13 1249

## 2013-06-22 NOTE — ED Notes (Signed)
Dr Tyrell Antonio (hospitalist) at bedside,

## 2013-06-22 NOTE — Progress Notes (Signed)
Patient continuing to tolerate nasal cannula well.  Oxygen saturation 97% on 5L [place].  Patient's 1600 ABG WNL.  Will continue to monitor patient. Eulis Canner Schonewitz 06/22/2013

## 2013-06-22 NOTE — ED Notes (Signed)
Pt states that the Bipap is helping with his breathing,

## 2013-06-22 NOTE — ED Notes (Signed)
Report given to floor,  

## 2013-06-22 NOTE — Progress Notes (Signed)
eLink Physician-Brief Progress Note Patient Name: Cameron Villarreal DOB: 21-Feb-1965 MRN: [id number]  Date of Service  06/22/2013   HPI/Events of Note   Asked to assess the patient by Dr. Chase Caller  eICU Interventions   No evidence of distress Resting comfortably with BiPAP mask on No accessory muscle use or paradoxical breathing pattern RR 15, tidal volume ~ 1000 mL on PS 10 SpO2 100 on FiO2 30 Hemodynamically stable without tachycardia    Intervention Category Intermediate Interventions: Communication with other healthcare providers and/or family  Doree Fudge 06/22/2013, 4:13 PM

## 2013-06-22 NOTE — Progress Notes (Signed)
  Call from Dr Merleen Milliner [facility] hospitalist at [facility].  Patient with RLL CAP and emphysema Need guidance on triage   No ability to camera care patient from elink by elink RN  Went through following decision tree with her at bedside    CURB-65 Admission Decision Scoring, Level 1 Rec Score Patient  Confusion / Delirium 1 She denies  Uremia - BUN >/= 20mg % 1 no  Respiratory Rate >/= 30/min 1 YES but not paradoxical  Per TRH  Blood pressure </= 40JWJ, or diastolic </= 60 1 Normal   Age >/= 62 1 Age 48  Score total (0-1 opd rx, 2 = admit, >/= 3 ?ICU) 6 1 is score  30d mort 0=0.7%, 1=2%, 2=9%, 3=14%, 4=40%, 5=57%      ICU CAP Admission Criteria   Minor Criteria   RR >/= 30 or need for mechanical ventilation YES, 1  PF ratio </= 250 PF ratio 257,  Multilobar infiltrates RLL only  Confusion/Disorientation/Acute delirium - hypo/hyper active No  WC </= 4K No  Platelet count </= 100K No  Hypothermia <36C No  Hypotension (even needing lot of fluids) No  Total Score for Minor Criteria   MAJOR CRITERIA   Septic shock - need for vasopressors No  Mechanical Ventilation (even non-invasive) On BiPAP  Total score for Major Criteria    Admit ICU if 3 minor or 1 major (Level 2 ATS rec) 1 minor, 1 major    No results found for this basename: PROCALCITON,  in the last 168 hours  Recent Labs Lab 06/22/13 0940  LATICACIDVEN 1.2     Based on above   - patient to go to ICU - SDU for BiPAP Rx  - monitor closely - [facility];  - Elink support from [facility] 3pm onwards - aggressive fluid and abx Rx - if worsens, move to [facility]    Dr. Brand Males, M.D., [facility].C.P Pulmonary and Critical Care Medicine Staff Physician [place] Pager: [phone], If no answer or between  15:00h - 7:00h: call [phone]  06/22/2013 1:06 PM

## 2013-06-22 NOTE — ED Notes (Signed)
Dr Tyrell Antonio at bedside speaking with pt and family

## 2013-06-22 NOTE — ED Notes (Signed)
Patient c/o shortness of breath with hemoptysis and right chest pain that started yesterday morning. Per patient has hx of COPD. Per patient last used neb breathing treatment last night with no relief.

## 2013-06-22 NOTE — Progress Notes (Signed)
ANTIBIOTIC CONSULT NOTE - INITIAL  Pharmacy Consult for Zosyn Indication: pneumonia  Allergies  Allergen Reactions  . Codeine Itching and Nausea Only    Patient Measurements: Height: 5\' 8"  (172.7 cm) Weight: 138 lb (62.596 kg) IBW/kg (Calculated) : 68.4 Adjusted Body Weight:   Vital Signs: Temp: 101.3 F (38.5 C) (05/16 0926) Temp src: Oral (05/16 0926) BP: 114/74 mmHg (05/16 1200) Pulse Rate: 93 (05/16 1154) Intake/Output from previous day:   Intake/Output from this shift: Total I/O In: -  Out: 700 [Urine:700]  Labs:  Recent Labs  06/22/13 0944  WBC 20.5*  HGB 16.2  PLT 322  CREATININE 0.83   Estimated Creatinine Clearance: 95.3 ml/min (by C-G formula based on Cr of 0.83). No results found for this basename: VANCOTROUGH, VANCOPEAK, VANCORANDOM, GENTTROUGH, GENTPEAK, GENTRANDOM, TOBRATROUGH, TOBRAPEAK, TOBRARND, AMIKACINPEAK, AMIKACINTROU, AMIKACIN,  in the last 72 hours   Microbiology: Recent Results (from the past 720 hour(s))  CULTURE, BLOOD (ROUTINE X 2)     Status: None   Collection Time    06/22/13  9:45 AM      Result Value Ref Range Status   Specimen Description BLOOD RIGHT ARM   Final   Special Requests BOTTLES DRAWN AEROBIC ONLY 6CC BOTTLE   Final   Culture PENDING   Incomplete   Report Status PENDING   Incomplete  CULTURE, BLOOD (ROUTINE X 2)     Status: None   Collection Time    06/22/13  9:51 AM      Result Value Ref Range Status   Specimen Description BLOOD RIGHT WRIST   Final   Special Requests     Final   Value: BOTTLES DRAWN AEROBIC AND ANAEROBIC 8CC EACH BOTTLE   Culture PENDING   Incomplete   Report Status PENDING   Incomplete    Medical History: Past Medical History  Diagnosis Date  . COPD (chronic obstructive pulmonary disease)     Severe emphysema, radiographically.  . Hypercholesterolemia   . S/P endoscopy March 2012    reflux esophagitis, no ulcerations  . S/P colonoscopy March 2012    normal  . Pulmonary nodule  07/04/2011    Medications:  Scheduled:  . methylPREDNISolone (SOLU-MEDROL) injection  40 mg Intravenous Q12H   Assessment: Pneumonia - CAP History of COPD Azithromycin & Ceftriaxone started in ED Ceftriaxone discontinued Excellent renal function  Goal of Therapy:  Eradicate infection  Plan:  Zosyn 3.375 GM IV every 8 hours, infuse each dose over 4 hours Monitor renal function Labs per protocol  Kiah Keay [employer] 06/22/2013,1:24 PM

## 2013-06-22 NOTE — H&P (Signed)
[facility] History and Physical  WYNDELL CARDIFF XBD:[id number] DOB: 01-07-1966 DOA: 06/22/2013  Referring physician: Dr Lacinda Axon.  PCP: Alonza Bogus, MD   Chief Complaint: SOB.   HPI: Cameron Villarreal is a 48 y.o. male with PMH significant for COPD, Emphysema, who presents to ED complaining of worsening SOB, that started 5 days ago. His mother help with HPI. Patient answer question, although with some limitation due to BIPAP. Patient with progressive SOB, cough for last 5 days. Nebulizer treatment didn't help. He was found to have fever at 102. He was hypoxic, in respiratory distress. He was started on BIPAP.  ABG on BIPAP: 7.4, PCO 39, PO2 103.     Review of Systems:  Negative, Except as per HPI.   Past Medical History  Diagnosis Date  . COPD (chronic obstructive pulmonary disease)     Severe emphysema, radiographically.  . Hypercholesterolemia   . S/P endoscopy March 2012    reflux esophagitis, no ulcerations  . S/P colonoscopy March 2012    normal  . Pulmonary nodule 07/04/2011   Past Surgical History  Procedure Laterality Date  . Cholecystectomy    . Appendectomy    . Right inguinal hernia repair    . Hernia repair    . Knee surgery     Social History:  reports that he has been smoking Cigarettes.  He has a 15 pack-year smoking history. He has never used smokeless tobacco. He reports that he drinks about .5 ounces of alcohol per week. He reports that he does not use illicit drugs.  Allergies  Allergen Reactions  . Codeine Itching and Nausea Only    Family History  Problem Relation Age of Onset  . Cirrhosis Father     deceased, ETOH cirrhosis  . Stomach cancer      aunt     Prior to Admission medications   Medication Sig Start Date End Date Taking? Authorizing Provider  albuterol (PROVENTIL) (5 MG/ML) 0.5% nebulizer solution Take 0.5 mLs (2.5 mg total) by nebulization every 4 (four) hours as needed for wheezing or shortness of breath. 04/18/12  Yes Johnna Acosta, MD  ALPRAZolam Duanne Moron) 1 MG tablet Take 1 mg by mouth 3 (three) times daily.   Yes Historical Provider, MD  aspirin EC 81 MG tablet Take 81 mg by mouth daily.   Yes Historical Provider, MD  Fluticasone-Salmeterol (ADVAIR DISKUS) 250-50 MCG/DOSE AEPB Inhale 1 puff into the lungs every 12 (twelve) hours.     Yes Historical Provider, MD  Oxycodone HCl 10 MG TABS Take 10 mg by mouth 3 (three) times daily.   Yes Historical Provider, MD   Physical Exam: Filed Vitals:   06/22/13 1200  BP: 114/74  Pulse:   Temp:   Resp: 18    BP 114/74  Pulse 93  Temp(Src) 101.3 F (38.5 C) (Oral)  Resp 18  Ht 5\' 8"  (1.727 m)  Wt 62.596 kg (138 lb)  BMI 20.99 kg/m2  SpO2 98%  General:  Appears calm and comfortable, in no distress, BIPA in place.  Eyes: PERRL, normal lids, irises & conjunctiva ENT: grossly normal hearing, lips & tongue, Poor Dentition.  Neck: no LAD, masses or thyromegaly Cardiovascular: RRR, no m/r/g. No LE edema. Telemetry: SR, no arrhythmias  Respiratory: Bilateral Air movement, no wheezing, not [place] Accessory muscle to breath.  Abdomen: soft, ntnd Skin: no rash or induration seen on limited exam Musculoskeletal: grossly normal tone BUE/BLE Neurologic: grossly non-focal. Following command, oriented to place , person.  Labs on Admission:  Basic Metabolic Panel:  Recent Labs Lab 06/22/13 0944  NA 132*  K 4.4  CL 91*  CO2 28  GLUCOSE 128*  BUN 6  CREATININE 0.83  CALCIUM 9.8   Liver Function Tests:  Recent Labs Lab 06/22/13 0944  AST 20  ALT 15  ALKPHOS 117  BILITOT 0.9  PROT 8.6*  ALBUMIN 3.7   No results found for this basename: LIPASE, AMYLASE,  in the last 168 hours No results found for this basename: AMMONIA,  in the last 168 hours CBC:  Recent Labs Lab 06/22/13 0944  WBC 20.5*  NEUTROABS 15.7*  HGB 16.2  HCT 48.5  MCV 89.2  PLT 322   Cardiac Enzymes:  Recent Labs Lab 06/22/13 0944  TROPONINI <0.30    BNP (last 3  results) No results found for this basename: PROBNP,  in the last 8760 hours CBG: No results found for this basename: GLUCAP,  in the last 168 hours  Radiological Exams on Admission: Dg Chest Portable 1 View  06/22/2013   CLINICAL DATA:  Shortness of breath and hemoptysis  EXAM: PORTABLE CHEST - 1 VIEW  COMPARISON:  Chest CT and chest radiograph April 04, 2013  FINDINGS: There is underlying emphysema. There is consolidation in the right lower lobe with focal interstitial thickening throughout much of the right upper lobe is well. Elsewhere lungs are clear. Heart size is normal. Pulmonary vascularity reflects underlying emphysema. No adenopathy is apparent.  IMPRESSION: Right lower lobe consolidation with interstitial thickening, probably due to a combination of interstitial and airspace pneumonitis. There is underlying emphysema. Lungs elsewhere are clear.  With respect to the opacity in the right lower lobe, the possibility of underlying neoplasm must be of concern, although given the degree of change since CT from February 2015, pneumonia isthmus likely. Would advise followup of study in 7-10 days to assess for clearing. If opacity has not shown clearing in that time interval, correlation with chest CT at that time would be advised to further assess.   Electronically Signed   By: Lowella Grip M.D.   On: 06/22/2013 09:58    EKG: Sinus Tachycardia.   Assessment/Plan Active Problems:   Community acquired pneumonia   Respiratory failure, acute  1-Acute respiratory Failure; Secondary to PNA and emphysema COPD exacerbation. Will continue with Azithromycin. I will change ceftriaxone to Zosyn to cover for anaerobic patient with poor dentition. Continue with BIPAP. Repeat ABG in 2 hours. Repeat chest x ray. Admit to ICU. Nebulizer treatments and IV solumedrol. Pulmonologist consult.  2-PNA; Start Zosyn, continue with azithromycin. Blood culture. He will probably need CT chest at some point.   Monitor for sepsis.   3-Emphysema, COPD; nebulizer treatment. IV solumedrol 4-Hyponatremia; IV fluids.   Code Status: Full Code.  Family Communication: Care discussed with mother who was at bedside.  Disposition Plan: expect more than 4 days inpatient.   Time spent: 75 minutes.   [facility] Pager [phone]

## 2013-06-22 NOTE — ED Notes (Signed)
resp therapy at bedside for abg

## 2013-06-22 NOTE — Progress Notes (Signed)
Patient requesting to eat his supper.  Removed bipap and placed patient on 5L nasal cannula.  Patient tolerating nasal cannula well at this time.  Will continue to monitor.  Cameron Villarreal 06/22/2013

## 2013-06-23 LAB — BASIC METABOLIC PANEL
BUN: 9 mg/dL (ref 6–23)
CHLORIDE: 101 meq/L (ref 96–112)
CO2: 26 mEq/L (ref 19–32)
CREATININE: 0.53 mg/dL (ref 0.50–1.35)
Calcium: 9 mg/dL (ref 8.4–10.5)
GFR calc non Af Amer: >90 mL/min (ref >90)
Glucose, Bld: 141 mg/dL — ABNORMAL HIGH (ref 70–99)
POTASSIUM: 3.9 meq/L (ref 3.7–5.3)
Sodium: 138 mEq/L (ref 137–147)

## 2013-06-23 LAB — CBC
HEMATOCRIT: 41.1 % (ref 39.0–52.0)
HEMOGLOBIN: 13.4 g/dL (ref 13.0–17.0)
MCH: 29.2 pg (ref 26.0–34.0)
MCHC: 32.6 g/dL (ref 30.0–36.0)
MCV: 89.5 fL (ref 78.0–100.0)
Platelets: 324 10*3/uL (ref 150–400)
RBC: 4.59 MIL/uL (ref 4.22–5.81)
RDW: 13.9 % (ref 11.5–15.5)
WBC: 21.6 10*3/uL — AB (ref 4.0–10.5)

## 2013-06-23 LAB — PROCALCITONIN: Procalcitonin: <0.1 ng/mL

## 2013-06-23 MED ORDER — OXYCODONE HCL 5 MG PO TABS
10.0000 mg | ORAL_TABLET | Freq: Three times a day (TID) | ORAL | Status: DC | PRN
  Administered 2013-06-23 – 2013-06-24 (×3): 10 mg via ORAL
  Filled 2013-06-23 (×3): qty 2

## 2013-06-23 NOTE — Progress Notes (Signed)
Subjective: He says he feels better than yesterday. He was admitted with pneumonia and required BiPAP. He has baseline COPD which is at least moderately severe  Objective: Vital signs in last 24 hours: Temp:  [97.4 F (36.3 C)-98.5 F (36.9 C)] 97.4 F (36.3 C) (05/17 0800) Pulse Rate:  [61-115] 95 (05/17 0900) Resp:  [15-30] 23 (05/17 0900) BP: (85-122)/(52-89) 110/69 mmHg (05/17 0900) SpO2:  [94 %-100 %] 95 % (05/17 0900) FiO2 (%):  [30 %-100 %] 30 % (05/16 1637) Weight:  [62.3 kg (137 lb 5.6 oz)-66.3 kg (146 lb 2.6 oz)] 66.3 kg (146 lb 2.6 oz) (05/17 0500) Weight change:  Last BM Date: 06/21/13  Intake/Output from previous day: 05/16 0701 - 05/17 0700 In: 2160.4 [I.V.:1860.4; IV Piggyback:300] Out: 2750 [Urine:2750]  PHYSICAL EXAM General appearance: alert, cooperative and mild distress Resp: rhonchi bilaterally Cardio: regular rate and rhythm, S1, S2 normal, no murmur, click, rub or gallop GI: soft, non-tender; bowel sounds normal; no masses,  no organomegaly Extremities: extremities normal, atraumatic, no cyanosis or edema  Lab Results:    Basic Metabolic Panel:  Recent Labs  06/22/13 0944 06/23/13 0454  NA 132* 138  K 4.4 3.9  CL 91* 101  CO2 28 26  GLUCOSE 128* 141*  BUN 6 9  CREATININE 0.83 0.53  CALCIUM 9.8 9.0   Liver Function Tests:  Recent Labs  06/22/13 0944  AST 20  ALT 15  ALKPHOS 117  BILITOT 0.9  PROT 8.6*  ALBUMIN 3.7   No results found for this basename: LIPASE, AMYLASE,  in the last 72 hours No results found for this basename: AMMONIA,  in the last 72 hours CBC:  Recent Labs  06/22/13 0944 06/23/13 0454  WBC 20.5* 21.6*  NEUTROABS 15.7*  --   HGB 16.2 13.4  HCT 48.5 41.1  MCV 89.2 89.5  PLT 322 324   Cardiac Enzymes:  Recent Labs  06/22/13 0944  TROPONINI <0.30   BNP: No results found for this basename: PROBNP,  in the last 72 hours D-Dimer: No results found for this basename: DDIMER,  in the last 72  hours CBG: No results found for this basename: GLUCAP,  in the last 72 hours Hemoglobin A1C: No results found for this basename: HGBA1C,  in the last 72 hours Fasting Lipid Panel: No results found for this basename: CHOL, HDL, LDLCALC, TRIG, CHOLHDL, LDLDIRECT,  in the last 72 hours Thyroid Function Tests: No results found for this basename: TSH, T4TOTAL, FREET4, T3FREE, THYROIDAB,  in the last 72 hours Anemia Panel: No results found for this basename: VITAMINB12, FOLATE, FERRITIN, TIBC, IRON, RETICCTPCT,  in the last 72 hours Coagulation: No results found for this basename: LABPROT, INR,  in the last 72 hours Urine Drug Screen: Drugs of Abuse  No results found for this basename: labopia, cocainscrnur, labbenz, amphetmu, thcu, labbarb    Alcohol Level: No results found for this basename: ETH,  in the last 72 hours Urinalysis: No results found for this basename: COLORURINE, APPERANCEUR, LABSPEC, PHURINE, GLUCOSEU, HGBUR, BILIRUBINUR, KETONESUR, PROTEINUR, UROBILINOGEN, NITRITE, LEUKOCYTESUR,  in the last 72 hours Misc. Labs:  ABGS  Recent Labs  06/22/13 1635  PHART 7.447  PO2ART 96.5  TCO2 21.9  HCO3 25.4*   CULTURES Recent Results (from the past 240 hour(s))  CULTURE, BLOOD (ROUTINE X 2)     Status: None   Collection Time    06/22/13  9:45 AM      Result Value Ref Range Status   Specimen  Description BLOOD RIGHT ARM   Final   Special Requests BOTTLES DRAWN AEROBIC ONLY 6CC BOTTLE   Final   Culture NO GROWTH 1 DAY   Final   Report Status PENDING   Incomplete  CULTURE, BLOOD (ROUTINE X 2)     Status: None   Collection Time    06/22/13  9:51 AM      Result Value Ref Range Status   Specimen Description BLOOD RIGHT WRIST   Final   Special Requests     Final   Value: BOTTLES DRAWN AEROBIC AND ANAEROBIC 8CC EACH BOTTLE   Culture NO GROWTH 1 DAY   Final   Report Status PENDING   Incomplete  MRSA PCR SCREENING     Status: None   Collection Time    06/22/13  2:05 PM       Result Value Ref Range Status   MRSA by PCR NEGATIVE  NEGATIVE Final   Comment:            The GeneXpert MRSA Assay (FDA     approved for NASAL specimens     only), is one component of a     comprehensive MRSA colonization     surveillance program. It is not     intended to diagnose MRSA     infection nor to guide or     monitor treatment for     MRSA infections.   Studies/Results: Dg Chest Portable 1 View  06/22/2013   CLINICAL DATA:  Shortness of breath and hemoptysis  EXAM: PORTABLE CHEST - 1 VIEW  COMPARISON:  Chest CT and chest radiograph April 04, 2013  FINDINGS: There is underlying emphysema. There is consolidation in the right lower lobe with focal interstitial thickening throughout much of the right upper lobe is well. Elsewhere lungs are clear. Heart size is normal. Pulmonary vascularity reflects underlying emphysema. No adenopathy is apparent.  IMPRESSION: Right lower lobe consolidation with interstitial thickening, probably due to a combination of interstitial and airspace pneumonitis. There is underlying emphysema. Lungs elsewhere are clear.  With respect to the opacity in the right lower lobe, the possibility of underlying neoplasm must be of concern, although given the degree of change since CT from February 2015, pneumonia isthmus likely. Would advise followup of study in 7-10 days to assess for clearing. If opacity has not shown clearing in that time interval, correlation with chest CT at that time would be advised to further assess.   Electronically Signed   By: Lowella Grip M.D.   On: 06/22/2013 09:58   Dg Chest Port 1v Same Day  06/22/2013   CLINICAL DATA:  Followup pneumonia  EXAM: PORTABLE CHEST - 1 VIEW SAME DAY  COMPARISON:  06/22/2013  FINDINGS: Normal heart size. No pleural effusion or edema. The lungs are hyperinflated and there are coarsened interstitial markings suggestive of emphysema. Pneumonia within the right lower lobe is unchanged from previous exam.   IMPRESSION: 1. Persistent right base pneumonia.   Electronically Signed   By: Kerby Moors M.D.   On: 06/22/2013 15:14    Medications:  Prior to Admission:  Prescriptions prior to admission  Medication Sig Dispense Refill  . albuterol (PROVENTIL) (5 MG/ML) 0.5% nebulizer solution Take 0.5 mLs (2.5 mg total) by nebulization every 4 (four) hours as needed for wheezing or shortness of breath.  20 mL  12  . ALPRAZolam (XANAX) 1 MG tablet Take 1 mg by mouth 3 (three) times daily.      Marland Kitchen aspirin  EC 81 MG tablet Take 81 mg by mouth daily.      . Fluticasone-Salmeterol (ADVAIR DISKUS) 250-50 MCG/DOSE AEPB Inhale 1 puff into the lungs every 12 (twelve) hours.        . Oxycodone HCl 10 MG TABS Take 10 mg by mouth 3 (three) times daily.       Scheduled: . aspirin EC  81 mg Oral Daily  . azithromycin  500 mg Intravenous Q24H  . enoxaparin (LOVENOX) injection  40 mg Subcutaneous Q24H  . ipratropium-albuterol  3 mL Nebulization Q4H  . methylPREDNISolone (SOLU-MEDROL) injection  40 mg Intravenous Q12H  . piperacillin-tazobactam (ZOSYN)  IV  3.375 g Intravenous Q8H  . pneumococcal 23 valent vaccine  0.5 mL Intramuscular Tomorrow-1000   Continuous: . sodium chloride 125 mL/hr at 06/23/13 0800   EXN:[id number], acetaminophen, ALPRAZolam, morphine injection, ondansetron (ZOFRAN) IV, ondansetron  Assesment: He has community-acquired pneumonia. I think he was at least mildly septic on admission. He is on antibiotics. He had been on BiPAP but he is now on nasal cannula and looks more comfortable.  He had acute respiratory failure which is improving  He has baseline COPD of  at least moderate degree Active Problems:   Community acquired pneumonia   Respiratory failure, acute    Plan: Continue current treatments. He will stay in the intensive care unit today and perhaps be able to be moved tomorrow    LOS: 1 day   Alonza Bogus 06/23/2013, 9:42 AM

## 2013-06-24 DIAGNOSIS — G8929 Other chronic pain: Secondary | ICD-10-CM | POA: Diagnosis present

## 2013-06-24 DIAGNOSIS — M549 Dorsalgia, unspecified: Secondary | ICD-10-CM

## 2013-06-24 DIAGNOSIS — F411 Generalized anxiety disorder: Secondary | ICD-10-CM | POA: Diagnosis present

## 2013-06-24 LAB — PROCALCITONIN

## 2013-06-24 MED ORDER — OXYCODONE HCL 5 MG PO TABS
10.0000 mg | ORAL_TABLET | Freq: Four times a day (QID) | ORAL | Status: DC
  Administered 2013-06-24 – 2013-06-25 (×4): 10 mg via ORAL
  Filled 2013-06-24 (×4): qty 2

## 2013-06-24 NOTE — Care Management Note (Addendum)
    Page 1 of 1   06/25/2013     8:25:38 AM CARE MANAGEMENT NOTE 06/25/2013  Patient:  Cameron Villarreal, Cameron Villarreal   Account Number:  [id number]  Date Initiated:  06/24/2013  Documentation initiated by:  Theophilus Kinds  Subjective/Objective Assessment:   Pt admitted from home with pneumonia. Pt lives with his mother and will return home at discharge. Pt is independent with ADL's. Pt has a neb machine for home use.     Action/Plan:   Pt has no insurance and stated that he is working on his disability. Pt stated that his mother helps him with medications. Financial counselor is aware of self pay status.   Anticipated DC Date:  06/26/2013   Anticipated DC Plan:  HOME/SELF CARE  In-house referral  Financial Counselor      DC Planning Services  CM consult      Choice offered to / List presented to:             Status of service:  Completed, signed off Medicare Important Message given?   (If response is "NO", the following Medicare IM given date fields will be blank) Date Medicare IM given:   Date Additional Medicare IM given:    Discharge Disposition:  HOME/SELF CARE  Per UR Regulation:    If discussed at Long Length of Stay Meetings, dates discussed:    Comments:  06/25/13 0825 Christinia Gully, RN BSN CM Pt discharged home today. No CM needs noted.  06/24/13 [place], RN BSN CM

## 2013-06-24 NOTE — Progress Notes (Signed)
UR chart review completed.  

## 2013-06-24 NOTE — Progress Notes (Signed)
Subjective: He says he feels well. He has no new complaints. His breathing is much improved  Objective: Vital signs in last 24 hours: Temp:  [97.7 F (36.5 C)-98.1 F (36.7 C)] 97.8 F (36.6 C) (05/18 0801) Pulse Rate:  [67-95] 71 (05/18 0700) Resp:  [11-23] 15 (05/18 0700) BP: (91-123)/(52-80) 91/57 mmHg (05/18 0400) SpO2:  [92 %-98 %] 96 % (05/18 0744) Weight:  [68.1 kg (150 lb 2.1 oz)] 68.1 kg (150 lb 2.1 oz) (05/18 0500) Weight change: 5.504 kg (12 lb 2.1 oz) Last BM Date: 06/21/13  Intake/Output from previous day: 05/17 0701 - 05/18 0700 In: 4170 [P.O.:720; I.V.:3000; IV Piggyback:450] Out: 2775 [Urine:2775]  PHYSICAL EXAM General appearance: alert, cooperative and no distress Resp: clear to auscultation bilaterally Cardio: regular rate and rhythm, S1, S2 normal, no murmur, click, rub or gallop GI: soft, non-tender; bowel sounds normal; no masses,  no organomegaly Extremities: extremities normal, atraumatic, no cyanosis or edema  Lab Results:    Basic Metabolic Panel:  Recent Labs  06/22/13 0944 06/23/13 0454  NA 132* 138  K 4.4 3.9  CL 91* 101  CO2 28 26  GLUCOSE 128* 141*  BUN 6 9  CREATININE 0.83 0.53  CALCIUM 9.8 9.0   Liver Function Tests:  Recent Labs  06/22/13 0944  AST 20  ALT 15  ALKPHOS 117  BILITOT 0.9  PROT 8.6*  ALBUMIN 3.7   No results found for this basename: LIPASE, AMYLASE,  in the last 72 hours No results found for this basename: AMMONIA,  in the last 72 hours CBC:  Recent Labs  06/22/13 0944 06/23/13 0454  WBC 20.5* 21.6*  NEUTROABS 15.7*  --   HGB 16.2 13.4  HCT 48.5 41.1  MCV 89.2 89.5  PLT 322 324   Cardiac Enzymes:  Recent Labs  06/22/13 0944  TROPONINI <0.30   BNP: No results found for this basename: PROBNP,  in the last 72 hours D-Dimer: No results found for this basename: DDIMER,  in the last 72 hours CBG: No results found for this basename: GLUCAP,  in the last 72 hours Hemoglobin A1C: No results  found for this basename: HGBA1C,  in the last 72 hours Fasting Lipid Panel: No results found for this basename: CHOL, HDL, LDLCALC, TRIG, CHOLHDL, LDLDIRECT,  in the last 72 hours Thyroid Function Tests: No results found for this basename: TSH, T4TOTAL, FREET4, T3FREE, THYROIDAB,  in the last 72 hours Anemia Panel: No results found for this basename: VITAMINB12, FOLATE, FERRITIN, TIBC, IRON, RETICCTPCT,  in the last 72 hours Coagulation: No results found for this basename: LABPROT, INR,  in the last 72 hours Urine Drug Screen: Drugs of Abuse  No results found for this basename: labopia, cocainscrnur, labbenz, amphetmu, thcu, labbarb    Alcohol Level: No results found for this basename: ETH,  in the last 72 hours Urinalysis: No results found for this basename: COLORURINE, APPERANCEUR, LABSPEC, PHURINE, GLUCOSEU, HGBUR, BILIRUBINUR, KETONESUR, PROTEINUR, UROBILINOGEN, NITRITE, LEUKOCYTESUR,  in the last 72 hours Misc. Labs:  ABGS  Recent Labs  06/22/13 1635  PHART 7.447  PO2ART 96.5  TCO2 21.9  HCO3 25.4*   CULTURES Recent Results (from the past 240 hour(s))  CULTURE, BLOOD (ROUTINE X 2)     Status: None   Collection Time    06/22/13  9:45 AM      Result Value Ref Range Status   Specimen Description BLOOD RIGHT ARM   Final   Special Requests BOTTLES DRAWN AEROBIC ONLY 6CC BOTTLE  Final   Culture NO GROWTH 1 DAY   Final   Report Status PENDING   Incomplete  CULTURE, BLOOD (ROUTINE X 2)     Status: None   Collection Time    06/22/13  9:51 AM      Result Value Ref Range Status   Specimen Description BLOOD RIGHT WRIST   Final   Special Requests     Final   Value: BOTTLES DRAWN AEROBIC AND ANAEROBIC 8CC EACH BOTTLE   Culture NO GROWTH 1 DAY   Final   Report Status PENDING   Incomplete  MRSA PCR SCREENING     Status: None   Collection Time    06/22/13  2:05 PM      Result Value Ref Range Status   MRSA by PCR NEGATIVE  NEGATIVE Final   Comment:            The GeneXpert  MRSA Assay (FDA     approved for NASAL specimens     only), is one component of a     comprehensive MRSA colonization     surveillance program. It is not     intended to diagnose MRSA     infection nor to guide or     monitor treatment for     MRSA infections.   Studies/Results: Dg Chest Portable 1 View  06/22/2013   CLINICAL DATA:  Shortness of breath and hemoptysis  EXAM: PORTABLE CHEST - 1 VIEW  COMPARISON:  Chest CT and chest radiograph April 04, 2013  FINDINGS: There is underlying emphysema. There is consolidation in the right lower lobe with focal interstitial thickening throughout much of the right upper lobe is well. Elsewhere lungs are clear. Heart size is normal. Pulmonary vascularity reflects underlying emphysema. No adenopathy is apparent.  IMPRESSION: Right lower lobe consolidation with interstitial thickening, probably due to a combination of interstitial and airspace pneumonitis. There is underlying emphysema. Lungs elsewhere are clear.  With respect to the opacity in the right lower lobe, the possibility of underlying neoplasm must be of concern, although given the degree of change since CT from February 2015, pneumonia isthmus likely. Would advise followup of study in 7-10 days to assess for clearing. If opacity has not shown clearing in that time interval, correlation with chest CT at that time would be advised to further assess.   Electronically Signed   By: Lowella Grip M.D.   On: 06/22/2013 09:58   Dg Chest Port 1v Same Day  06/22/2013   CLINICAL DATA:  Followup pneumonia  EXAM: PORTABLE CHEST - 1 VIEW SAME DAY  COMPARISON:  06/22/2013  FINDINGS: Normal heart size. No pleural effusion or edema. The lungs are hyperinflated and there are coarsened interstitial markings suggestive of emphysema. Pneumonia within the right lower lobe is unchanged from previous exam.  IMPRESSION: 1. Persistent right base pneumonia.   Electronically Signed   By: Kerby Moors M.D.   On:  06/22/2013 15:14    Medications:  Prior to Admission:  Prescriptions prior to admission  Medication Sig Dispense Refill  . albuterol (PROVENTIL) (5 MG/ML) 0.5% nebulizer solution Take 0.5 mLs (2.5 mg total) by nebulization every 4 (four) hours as needed for wheezing or shortness of breath.  20 mL  12  . ALPRAZolam (XANAX) 1 MG tablet Take 1 mg by mouth 3 (three) times daily.      Marland Kitchen aspirin EC 81 MG tablet Take 81 mg by mouth daily.      . Fluticasone-Salmeterol (ADVAIR DISKUS)  250-50 MCG/DOSE AEPB Inhale 1 puff into the lungs every 12 (twelve) hours.        . Oxycodone HCl 10 MG TABS Take 10 mg by mouth 3 (three) times daily.       Scheduled: . aspirin EC  81 mg Oral Daily  . azithromycin  500 mg Intravenous Q24H  . enoxaparin (LOVENOX) injection  40 mg Subcutaneous Q24H  . ipratropium-albuterol  3 mL Nebulization Q4H  . methylPREDNISolone (SOLU-MEDROL) injection  40 mg Intravenous Q12H  . piperacillin-tazobactam (ZOSYN)  IV  3.375 g Intravenous Q8H   Continuous: . sodium chloride 125 mL/hr at 06/24/13 0700   FSF:[id number], acetaminophen, ALPRAZolam, ondansetron (ZOFRAN) IV, ondansetron, oxyCODONE  Assesment: He was admitted with acute respiratory failure. He has community-acquired pneumonia. He has COPD which is at least moderately severe. He has chronic back and leg pain. He has some issues with anxiety as well. Overall he is much improved Active Problems:   Community acquired pneumonia   Respiratory failure, acute    Plan: Transfer to floor and probably home tomorrow    LOS: 2 days   Cameron Villarreal 06/24/2013, 8:27 AM

## 2013-06-25 MED ORDER — METHYLPREDNISOLONE (PAK) 4 MG PO TABS: ORAL_TABLET | ORAL | Status: DC

## 2013-06-25 MED ORDER — OXYCODONE HCL 10 MG PO TABS: 10.0000 mg | ORAL_TABLET | Freq: Four times a day (QID) | ORAL | Status: DC | PRN

## 2013-06-25 MED ORDER — AMOXICILLIN-POT CLAVULANATE 875-125 MG PO TABS: 1.0000 | ORAL_TABLET | Freq: Two times a day (BID) | ORAL | Status: DC

## 2013-06-25 NOTE — Progress Notes (Signed)
DISCHARGE INSTRUCTIONS GIVEN. NPC. DIMINISHED BREATH SOUNDS. NO SOB. MOTHER TO PICK PT UP.

## 2013-06-25 NOTE — Progress Notes (Signed)
Subjective: He says he feels much better. He wants to go home. He has no new complaints.  Objective: Vital signs in last 24 hours: Temp:  [97.9 F (36.6 C)-98.2 F (36.8 C)] 98.2 F (36.8 C) (05/19 0400) Pulse Rate:  [74-104] 76 (05/19 0000) Resp:  [12-16] 16 (05/19 0000) BP: (127-130)/(78-79) 130/79 mmHg (05/19 0000) SpO2:  [81 %-97 %] 96 % (05/19 0714) Weight:  [66.4 kg (146 lb 6.2 oz)] 66.4 kg (146 lb 6.2 oz) (05/19 0500) Weight change: -1.7 kg (-3 lb 12 oz) Last BM Date: 06/24/13  Intake/Output from previous day: 05/18 0701 - 05/19 0700 In: 3615 [P.O.:1440; I.V.:1875; IV Piggyback:300] Out: 300 [Urine:300]  PHYSICAL EXAM General appearance: alert, cooperative and no distress Resp: rhonchi bilaterally Cardio: regular rate and rhythm, S1, S2 normal, no murmur, click, rub or gallop GI: soft, non-tender; bowel sounds normal; no masses,  no organomegaly Extremities: extremities normal, atraumatic, no cyanosis or edema  Lab Results:    Basic Metabolic Panel:  Recent Labs  06/22/13 0944 06/23/13 0454  NA 132* 138  K 4.4 3.9  CL 91* 101  CO2 28 26  GLUCOSE 128* 141*  BUN 6 9  CREATININE 0.83 0.53  CALCIUM 9.8 9.0   Liver Function Tests:  Recent Labs  06/22/13 0944  AST 20  ALT 15  ALKPHOS 117  BILITOT 0.9  PROT 8.6*  ALBUMIN 3.7   No results found for this basename: LIPASE, AMYLASE,  in the last 72 hours No results found for this basename: AMMONIA,  in the last 72 hours CBC:  Recent Labs  06/22/13 0944 06/23/13 0454  WBC 20.5* 21.6*  NEUTROABS 15.7*  --   HGB 16.2 13.4  HCT 48.5 41.1  MCV 89.2 89.5  PLT 322 324   Cardiac Enzymes:  Recent Labs  06/22/13 0944  TROPONINI <0.30   BNP: No results found for this basename: PROBNP,  in the last 72 hours D-Dimer: No results found for this basename: DDIMER,  in the last 72 hours CBG: No results found for this basename: GLUCAP,  in the last 72 hours Hemoglobin A1C: No results found for this  basename: HGBA1C,  in the last 72 hours Fasting Lipid Panel: No results found for this basename: CHOL, HDL, LDLCALC, TRIG, CHOLHDL, LDLDIRECT,  in the last 72 hours Thyroid Function Tests: No results found for this basename: TSH, T4TOTAL, FREET4, T3FREE, THYROIDAB,  in the last 72 hours Anemia Panel: No results found for this basename: VITAMINB12, FOLATE, FERRITIN, TIBC, IRON, RETICCTPCT,  in the last 72 hours Coagulation: No results found for this basename: LABPROT, INR,  in the last 72 hours Urine Drug Screen: Drugs of Abuse  No results found for this basename: labopia, cocainscrnur, labbenz, amphetmu, thcu, labbarb    Alcohol Level: No results found for this basename: ETH,  in the last 72 hours Urinalysis: No results found for this basename: COLORURINE, APPERANCEUR, LABSPEC, PHURINE, GLUCOSEU, HGBUR, BILIRUBINUR, KETONESUR, PROTEINUR, UROBILINOGEN, NITRITE, LEUKOCYTESUR,  in the last 72 hours Misc. Labs:  ABGS  Recent Labs  06/22/13 1635  PHART 7.447  PO2ART 96.5  TCO2 21.9  HCO3 25.4*   CULTURES Recent Results (from the past 240 hour(s))  CULTURE, BLOOD (ROUTINE X 2)     Status: None   Collection Time    06/22/13  9:45 AM      Result Value Ref Range Status   Specimen Description BLOOD RIGHT ARM   Final   Special Requests BOTTLES DRAWN AEROBIC ONLY 6CC BOTTLE  Final   Culture NO GROWTH 2 DAYS   Final   Report Status PENDING   Incomplete  CULTURE, BLOOD (ROUTINE X 2)     Status: None   Collection Time    06/22/13  9:51 AM      Result Value Ref Range Status   Specimen Description BLOOD RIGHT WRIST   Final   Special Requests     Final   Value: BOTTLES DRAWN AEROBIC AND ANAEROBIC 8CC EACH BOTTLE   Culture NO GROWTH 2 DAYS   Final   Report Status PENDING   Incomplete  MRSA PCR SCREENING     Status: None   Collection Time    06/22/13  2:05 PM      Result Value Ref Range Status   MRSA by PCR NEGATIVE  NEGATIVE Final   Comment:            The GeneXpert MRSA Assay  (FDA     approved for NASAL specimens     only), is one component of a     comprehensive MRSA colonization     surveillance program. It is not     intended to diagnose MRSA     infection nor to guide or     monitor treatment for     MRSA infections.   Studies/Results: No results found.  Medications:  Prior to Admission:  Prescriptions prior to admission  Medication Sig Dispense Refill  . albuterol (PROVENTIL) (5 MG/ML) 0.5% nebulizer solution Take 0.5 mLs (2.5 mg total) by nebulization every 4 (four) hours as needed for wheezing or shortness of breath.  20 mL  12  . ALPRAZolam (XANAX) 1 MG tablet Take 1 mg by mouth 3 (three) times daily.      Marland Kitchen aspirin EC 81 MG tablet Take 81 mg by mouth daily.      . Fluticasone-Salmeterol (ADVAIR DISKUS) 250-50 MCG/DOSE AEPB Inhale 1 puff into the lungs every 12 (twelve) hours.        . [DISCONTINUED] Oxycodone HCl 10 MG TABS Take 10 mg by mouth 3 (three) times daily.       Scheduled: . aspirin EC  81 mg Oral Daily  . azithromycin  500 mg Intravenous Q24H  . enoxaparin (LOVENOX) injection  40 mg Subcutaneous Q24H  . ipratropium-albuterol  3 mL Nebulization Q4H  . methylPREDNISolone (SOLU-MEDROL) injection  40 mg Intravenous Q12H  . oxyCODONE  10 mg Oral 4 times per day  . piperacillin-tazobactam (ZOSYN)  IV  3.375 g Intravenous Q8H   Continuous: . sodium chloride 125 mL/hr at 06/25/13 0700   DUK:[id number], acetaminophen, ALPRAZolam, ondansetron (ZOFRAN) IV, ondansetron  Assesment: He was admitted with community-acquired pneumonia and had acute respiratory failure which has improved. He has COPD and had worsening problems with that during the hospitalization but that has improved. He has chronic anxiety which is unchanged and chronic back pain which is also unchanged Active Problems:   COPD exacerbation   Community acquired pneumonia   Respiratory failure, acute   Anxiety state, unspecified   Chronic back pain greater than 3 months  duration    Plan: Discharge home today    LOS: 3 days   Cameron Villarreal 06/25/2013, 8:35 AM

## 2013-06-27 LAB — CULTURE, BLOOD (ROUTINE X 2)
CULTURE: NO GROWTH
Culture: NO GROWTH

## 2013-07-06 NOTE — Discharge Summary (Signed)
Physician Discharge Summary  Patient ID: Cameron Villarreal MRN: [id number] DOB/AGE: 02/15/1965 48 y.o. Primary Care Physician:[name], MD Admit date: 06/22/2013 Discharge date: 07/06/2013    Discharge Diagnoses:   Active Problems:   COPD exacerbation   Community acquired pneumonia   Respiratory failure, acute   Anxiety state, unspecified   Chronic back pain greater than 3 months duration     Medication List         ADVAIR DISKUS 250-50 MCG/DOSE Aepb  Generic drug:  Fluticasone-Salmeterol  Inhale 1 puff into the lungs every 12 (twelve) hours.     albuterol (5 MG/ML) 0.5% nebulizer solution  Commonly known as:  PROVENTIL  Take 0.5 mLs (2.5 mg total) by nebulization every 4 (four) hours as needed for wheezing or shortness of breath.     ALPRAZolam 1 MG tablet  Commonly known as:  XANAX  Take 1 mg by mouth 3 (three) times daily.     amoxicillin-clavulanate 875-125 MG per tablet  Commonly known as:  AUGMENTIN  Take 1 tablet by mouth 2 (two) times daily.     aspirin EC 81 MG tablet  Take 81 mg by mouth daily.     methylPREDNIsolone 4 MG tablet  Commonly known as:  MEDROL DOSPACK  follow package directions     Oxycodone HCl 10 MG Tabs  Take 1 tablet (10 mg total) by mouth 4 (four) times daily as needed (pain).        Discharged Condition: Improved    Consults: None  Significant Diagnostic Studies: Dg Chest Portable 1 View  06/22/2013   CLINICAL DATA:  Shortness of breath and hemoptysis  EXAM: PORTABLE CHEST - 1 VIEW  COMPARISON:  Chest CT and chest radiograph April 04, 2013  FINDINGS: There is underlying emphysema. There is consolidation in the right lower lobe with focal interstitial thickening throughout much of the right upper lobe is well. Elsewhere lungs are clear. Heart size is normal. Pulmonary vascularity reflects underlying emphysema. No adenopathy is apparent.  IMPRESSION: Right lower lobe consolidation with interstitial thickening, probably due to  a combination of interstitial and airspace pneumonitis. There is underlying emphysema. Lungs elsewhere are clear.  With respect to the opacity in the right lower lobe, the possibility of underlying neoplasm must be of concern, although given the degree of change since CT from February 2015, pneumonia isthmus likely. Would advise followup of study in 7-10 days to assess for clearing. If opacity has not shown clearing in that time interval, correlation with chest CT at that time would be advised to further assess.   Electronically Signed   By: Lowella Grip M.D.   On: 06/22/2013 09:58   Dg Chest Port 1v Same Day  06/22/2013   CLINICAL DATA:  Followup pneumonia  EXAM: PORTABLE CHEST - 1 VIEW SAME DAY  COMPARISON:  06/22/2013  FINDINGS: Normal heart size. No pleural effusion or edema. The lungs are hyperinflated and there are coarsened interstitial markings suggestive of emphysema. Pneumonia within the right lower lobe is unchanged from previous exam.  IMPRESSION: 1. Persistent right base pneumonia.   Electronically Signed   By: Kerby Moors M.D.   On: 06/22/2013 15:14    Lab Results: Basic Metabolic Panel: No results found for this basename: NA, K, CL, CO2, GLUCOSE, BUN, CREATININE, CALCIUM, MG, PHOS,  in the last 72 hours Liver Function Tests: No results found for this basename: AST, ALT, ALKPHOS, BILITOT, PROT, ALBUMIN,  in the last 72 hours   CBC: No results found  for this basename: WBC, NEUTROABS, HGB, HCT, MCV, PLT,  in the last 72 hours  No results found for this or any previous visit (from the past 240 hour(s)).   Hospital Course: He came to the emergency room with increasing shortness of breath. He was coughing and congested. He became more short of breath and required BiPAP but that was fairly brief period he was treated with intravenous antibiotics IV fluids and improved. He was back at baseline after approximately 48 hours.  Discharge Exam: Blood pressure 130/79, pulse 76,  temperature 98.2 F (36.8 C), temperature source Oral, resp. rate 16, height 5\' 8"  (1.727 m), weight 66.4 kg (146 lb 6.2 oz), SpO2 96.00%. He is awake and alert. He has rhonchi bilaterally on his chest exam. His heart is regular.  Disposition: Home he will need followup chest x-ray to document clearing of the infiltrate      Discharge Instructions   Discharge patient    Complete by:  As directed              Signed: Alonza Bogus   07/06/2013, 8:16 AM

## 2013-12-27 ENCOUNTER — Ambulatory Visit (HOSPITAL_COMMUNITY)
Admission: RE | Admit: 2013-12-27 | Discharge: 2013-12-27 | Disposition: A | Discharge status: Home / Self Care | Payer: Disability Insurance | Source: Ambulatory Visit | Attending: Family Medicine | Admitting: Family Medicine

## 2013-12-27 ENCOUNTER — Other Ambulatory Visit (HOSPITAL_COMMUNITY): Payer: Self-pay | Admitting: Family Medicine

## 2013-12-27 DIAGNOSIS — Z0271 Encounter for disability determination: Secondary | ICD-10-CM | POA: Insufficient documentation

## 2013-12-27 DIAGNOSIS — M25561 Pain in right knee: Secondary | ICD-10-CM | POA: Insufficient documentation

## 2013-12-27 DIAGNOSIS — M25562 Pain in left knee: Principal | ICD-10-CM

## 2014-07-01 ENCOUNTER — Inpatient Hospital Stay (HOSPITAL_COMMUNITY)
Admission: EM | Admit: 2014-07-01 | Discharge: 2014-07-03 | DRG: 190 | Disposition: A | Discharge status: Home Health | Payer: Medicaid Other | Attending: Pulmonary Disease | Admitting: Pulmonary Disease

## 2014-07-01 ENCOUNTER — Encounter (HOSPITAL_COMMUNITY): Payer: Self-pay | Admitting: Cardiology

## 2014-07-01 ENCOUNTER — Other Ambulatory Visit (HOSPITAL_COMMUNITY): Payer: Self-pay

## 2014-07-01 ENCOUNTER — Emergency Department (HOSPITAL_COMMUNITY): Payer: Medicaid Other

## 2014-07-01 DIAGNOSIS — Z9981 Dependence on supplemental oxygen: Secondary | ICD-10-CM | POA: Diagnosis not present

## 2014-07-01 DIAGNOSIS — M545 Low back pain: Secondary | ICD-10-CM | POA: Diagnosis present

## 2014-07-01 DIAGNOSIS — E780 Pure hypercholesterolemia: Secondary | ICD-10-CM | POA: Diagnosis present

## 2014-07-01 DIAGNOSIS — Z681 Body mass index (BMI) 19 or less, adult: Secondary | ICD-10-CM | POA: Diagnosis not present

## 2014-07-01 DIAGNOSIS — Z79899 Other long term (current) drug therapy: Secondary | ICD-10-CM

## 2014-07-01 DIAGNOSIS — E440 Moderate protein-calorie malnutrition: Secondary | ICD-10-CM | POA: Insufficient documentation

## 2014-07-01 DIAGNOSIS — R072 Precordial pain: Secondary | ICD-10-CM | POA: Diagnosis not present

## 2014-07-01 DIAGNOSIS — Z720 Tobacco use: Secondary | ICD-10-CM

## 2014-07-01 DIAGNOSIS — E860 Dehydration: Secondary | ICD-10-CM | POA: Diagnosis present

## 2014-07-01 DIAGNOSIS — J9621 Acute and chronic respiratory failure with hypoxia: Secondary | ICD-10-CM | POA: Diagnosis present

## 2014-07-01 DIAGNOSIS — F17210 Nicotine dependence, cigarettes, uncomplicated: Secondary | ICD-10-CM | POA: Diagnosis present

## 2014-07-01 DIAGNOSIS — M549 Dorsalgia, unspecified: Secondary | ICD-10-CM

## 2014-07-01 DIAGNOSIS — Z7982 Long term (current) use of aspirin: Secondary | ICD-10-CM

## 2014-07-01 DIAGNOSIS — F419 Anxiety disorder, unspecified: Secondary | ICD-10-CM | POA: Diagnosis present

## 2014-07-01 DIAGNOSIS — J441 Chronic obstructive pulmonary disease with (acute) exacerbation: Secondary | ICD-10-CM | POA: Diagnosis not present

## 2014-07-01 DIAGNOSIS — G8929 Other chronic pain: Secondary | ICD-10-CM | POA: Diagnosis present

## 2014-07-01 DIAGNOSIS — R9431 Abnormal electrocardiogram [ECG] [EKG]: Secondary | ICD-10-CM

## 2014-07-01 DIAGNOSIS — R0602 Shortness of breath: Secondary | ICD-10-CM | POA: Diagnosis present

## 2014-07-01 DIAGNOSIS — R079 Chest pain, unspecified: Secondary | ICD-10-CM

## 2014-07-01 DIAGNOSIS — E785 Hyperlipidemia, unspecified: Secondary | ICD-10-CM | POA: Diagnosis present

## 2014-07-01 DIAGNOSIS — I10 Essential (primary) hypertension: Secondary | ICD-10-CM | POA: Diagnosis present

## 2014-07-01 DIAGNOSIS — F411 Generalized anxiety disorder: Secondary | ICD-10-CM | POA: Diagnosis present

## 2014-07-01 DIAGNOSIS — D751 Secondary polycythemia: Secondary | ICD-10-CM | POA: Diagnosis present

## 2014-07-01 LAB — BASIC METABOLIC PANEL
Anion gap: 13 (ref 5–15)
BUN: 12 mg/dL (ref 6–20)
CO2: 26 mmol/L (ref 22–32)
Calcium: 9.5 mg/dL (ref 8.9–10.3)
Chloride: 95 mmol/L — ABNORMAL LOW (ref 101–111)
Creatinine, Ser: 0.58 mg/dL — ABNORMAL LOW (ref 0.61–1.24)
GFR calc Af Amer: >60 mL/min (ref >60)
GFR calc non Af Amer: >60 mL/min (ref >60)
GLUCOSE: 109 mg/dL — AB (ref 65–99)
Potassium: 3.9 mmol/L (ref 3.5–5.1)
Sodium: 134 mmol/L — ABNORMAL LOW (ref 135–145)

## 2014-07-01 LAB — CBC WITH DIFFERENTIAL/PLATELET
BASOS ABS: 0 10*3/uL (ref 0.0–0.1)
Basophils Relative: 0 % (ref 0–1)
EOS ABS: 0 10*3/uL (ref 0.0–0.7)
Eosinophils Relative: 0 % (ref 0–5)
HEMATOCRIT: 53.6 % — AB (ref 39.0–52.0)
Hemoglobin: 18 g/dL — ABNORMAL HIGH (ref 13.0–17.0)
Lymphocytes Relative: 7 % — ABNORMAL LOW (ref 12–46)
Lymphs Abs: 1.3 10*3/uL (ref 0.7–4.0)
MCH: 30.5 pg (ref 26.0–34.0)
MCHC: 33.6 g/dL (ref 30.0–36.0)
MCV: 90.7 fL (ref 78.0–100.0)
Monocytes Absolute: 1.9 10*3/uL — ABNORMAL HIGH (ref 0.1–1.0)
Monocytes Relative: 11 % (ref 3–12)
NEUTROS ABS: 15 10*3/uL — AB (ref 1.7–7.7)
Neutrophils Relative %: 82 % — ABNORMAL HIGH (ref 43–77)
Platelets: 270 10*3/uL (ref 150–400)
RBC: 5.91 MIL/uL — ABNORMAL HIGH (ref 4.22–5.81)
RDW: 14.3 % (ref 11.5–15.5)
WBC: 18.2 10*3/uL — AB (ref 4.0–10.5)

## 2014-07-01 LAB — I-STAT TROPONIN, ED: Troponin i, poc: 0 ng/mL (ref 0.00–0.08)

## 2014-07-01 LAB — TROPONIN I: Troponin I: <0.03 ng/mL (ref <0.031)

## 2014-07-01 MED ORDER — ALBUTEROL SULFATE (2.5 MG/3ML) 0.083% IN NEBU
5.0000 mg | INHALATION_SOLUTION | Freq: Once | RESPIRATORY_TRACT | Status: AC
  Administered 2014-07-01: 5 mg via RESPIRATORY_TRACT
  Filled 2014-07-01: qty 6

## 2014-07-01 MED ORDER — SODIUM CHLORIDE 0.9 % IJ SOLN
3.0000 mL | Freq: Two times a day (BID) | INTRAMUSCULAR | Status: DC
  Administered 2014-07-01: 3 mL via INTRAVENOUS

## 2014-07-01 MED ORDER — OXYCODONE-ACETAMINOPHEN 10-325 MG PO TABS: 1.0000 | ORAL_TABLET | Freq: Three times a day (TID) | ORAL | Status: DC | PRN

## 2014-07-01 MED ORDER — ALBUTEROL SULFATE (2.5 MG/3ML) 0.083% IN NEBU
2.5000 mg | INHALATION_SOLUTION | Freq: Once | RESPIRATORY_TRACT | Status: AC
  Administered 2014-07-01: 2.5 mg via RESPIRATORY_TRACT
  Filled 2014-07-01: qty 3

## 2014-07-01 MED ORDER — ASPIRIN EC 81 MG PO TBEC
81.0000 mg | DELAYED_RELEASE_TABLET | Freq: Every day | ORAL | Status: DC
  Administered 2014-07-02 – 2014-07-03 (×2): 81 mg via ORAL
  Filled 2014-07-01 (×2): qty 1

## 2014-07-01 MED ORDER — ASPIRIN 81 MG PO CHEW
324.0000 mg | CHEWABLE_TABLET | Freq: Once | ORAL | Status: AC
  Administered 2014-07-01: 324 mg via ORAL
  Filled 2014-07-01: qty 4

## 2014-07-01 MED ORDER — METHYLPREDNISOLONE SODIUM SUCC 125 MG IJ SOLR
125.0000 mg | Freq: Four times a day (QID) | INTRAMUSCULAR | Status: DC
  Administered 2014-07-01 – 2014-07-03 (×7): 125 mg via INTRAVENOUS
  Filled 2014-07-01 (×7): qty 2

## 2014-07-01 MED ORDER — IPRATROPIUM-ALBUTEROL 0.5-2.5 (3) MG/3ML IN SOLN
3.0000 mL | Freq: Once | RESPIRATORY_TRACT | Status: AC
  Administered 2014-07-01: 3 mL via RESPIRATORY_TRACT
  Filled 2014-07-01: qty 3

## 2014-07-01 MED ORDER — OXYCODONE-ACETAMINOPHEN 5-325 MG PO TABS
1.0000 | ORAL_TABLET | Freq: Three times a day (TID) | ORAL | Status: DC | PRN
  Administered 2014-07-01 – 2014-07-02 (×3): 1 via ORAL
  Filled 2014-07-01 (×3): qty 1

## 2014-07-01 MED ORDER — HEPARIN SODIUM (PORCINE) 5000 UNIT/ML IJ SOLN
5000.0000 [IU] | Freq: Three times a day (TID) | INTRAMUSCULAR | Status: DC
  Administered 2014-07-01 – 2014-07-03 (×5): 5000 [IU] via SUBCUTANEOUS
  Filled 2014-07-01 (×5): qty 1

## 2014-07-01 MED ORDER — PREDNISONE 50 MG PO TABS
60.0000 mg | ORAL_TABLET | Freq: Once | ORAL | Status: AC
  Administered 2014-07-01: 60 mg via ORAL
  Filled 2014-07-01 (×2): qty 1

## 2014-07-01 MED ORDER — SODIUM CHLORIDE 0.9 % IV SOLN
INTRAVENOUS | Status: DC
  Administered 2014-07-01 – 2014-07-03 (×3): via INTRAVENOUS

## 2014-07-01 MED ORDER — MOMETASONE FURO-FORMOTEROL FUM 100-5 MCG/ACT IN AERO
2.0000 | INHALATION_SPRAY | Freq: Two times a day (BID) | RESPIRATORY_TRACT | Status: DC
  Administered 2014-07-02 – 2014-07-03 (×3): 2 via RESPIRATORY_TRACT
  Filled 2014-07-01: qty 8.8

## 2014-07-01 MED ORDER — ALBUTEROL SULFATE (2.5 MG/3ML) 0.083% IN NEBU
2.5000 mg | INHALATION_SOLUTION | RESPIRATORY_TRACT | Status: DC | PRN
  Administered 2014-07-01 (×2): 2.5 mg via RESPIRATORY_TRACT
  Filled 2014-07-01 (×2): qty 3

## 2014-07-01 MED ORDER — ALPRAZOLAM 1 MG PO TABS
1.0000 mg | ORAL_TABLET | Freq: Three times a day (TID) | ORAL | Status: DC
  Administered 2014-07-01 – 2014-07-03 (×5): 1 mg via ORAL
  Filled 2014-07-01 (×5): qty 1

## 2014-07-01 MED ORDER — OXYCODONE HCL 5 MG PO TABS
5.0000 mg | ORAL_TABLET | Freq: Three times a day (TID) | ORAL | Status: DC | PRN
  Administered 2014-07-01 – 2014-07-02 (×3): 5 mg via ORAL
  Filled 2014-07-01 (×3): qty 1

## 2014-07-01 MED ORDER — ALBUTEROL SULFATE (2.5 MG/3ML) 0.083% IN NEBU
2.5000 mg | INHALATION_SOLUTION | Freq: Four times a day (QID) | RESPIRATORY_TRACT | Status: DC
Start: 2014-07-02 — End: 2014-07-03
  Administered 2014-07-02 – 2014-07-03 (×5): 2.5 mg via RESPIRATORY_TRACT
  Filled 2014-07-01 (×5): qty 3

## 2014-07-01 MED ORDER — OXYCODONE-ACETAMINOPHEN 5-325 MG PO TABS
2.0000 | ORAL_TABLET | Freq: Once | ORAL | Status: AC
  Administered 2014-07-01: 2 via ORAL
  Filled 2014-07-01: qty 2

## 2014-07-01 MED ORDER — ONDANSETRON HCL 4 MG/2ML IJ SOLN: 4.0000 mg | Freq: Four times a day (QID) | INTRAMUSCULAR | Status: DC | PRN

## 2014-07-01 MED ORDER — ONDANSETRON HCL 4 MG PO TABS: 4.0000 mg | ORAL_TABLET | Freq: Four times a day (QID) | ORAL | Status: DC | PRN

## 2014-07-01 MED ORDER — LEVOFLOXACIN IN D5W 500 MG/100ML IV SOLN
500.0000 mg | INTRAVENOUS | Status: DC
  Administered 2014-07-01 – 2014-07-02 (×2): 500 mg via INTRAVENOUS
  Filled 2014-07-01 (×2): qty 100

## 2014-07-01 NOTE — ED Notes (Signed)
Respiratory paged at this time.

## 2014-07-01 NOTE — H&P (Signed)
[facility] History and Physical  Cameron Villarreal XHB:[id number] DOB: [birth year] DOA: 07/01/2014  Referring physician: ER, Dr. Christy Gentles PCP: Alonza Bogus, MD   Chief Complaint: Dyspnea. Productive cough.  HPI: Cameron Villarreal is a 49 y.o. male  This is a 49 year old man, history of COPD, ongoing tobacco abuse, presents with 3-4 day history of increased dyspnea associated with cough productive of green sputum. He has been wheezing. He also complains of intermittent chest pain which is on the left side of his chest and his nonspecific and he says that he has had it for a while now but could not specify. Apparently, it comes on at rest but he is not very specific of further description. He has not had a fever. Evaluation the emergency room showed him to have an exacerbation of COPD. He also had an abnormal ECG and this has prompted also partly his admission to the hospital.   Review of Systems:  Apart from symptoms above, all systems negative.   Past Medical History  Diagnosis Date  . COPD (chronic obstructive pulmonary disease)     Severe emphysema, radiographically.  . Hypercholesterolemia   . S/P endoscopy March 2012    reflux esophagitis, no ulcerations  . S/P colonoscopy March 2012    normal  . Pulmonary nodule 07/04/2011   Past Surgical History  Procedure Laterality Date  . Cholecystectomy    . Appendectomy    . Right inguinal hernia repair    . Hernia repair    . Knee surgery     Social History:  reports that he has quit smoking. His smoking use included Cigarettes. He has a 15 pack-year smoking history. He has never used smokeless tobacco. He reports that he drinks about 0.5 oz of alcohol per week. He reports that he does not use illicit drugs.  Allergies  Allergen Reactions  . Codeine Itching and Nausea Only    Family History  Problem Relation Age of Onset  . Cirrhosis Father     deceased, ETOH cirrhosis  . Stomach cancer      aunt    Prior to Admission  medications   Medication Sig Start Date End Date Taking? Authorizing Provider  albuterol (PROVENTIL) (5 MG/ML) 0.5% nebulizer solution Take 0.5 mLs (2.5 mg total) by nebulization every 4 (four) hours as needed for wheezing or shortness of breath. 04/18/12  Yes Noemi Chapel, MD  ALPRAZolam Duanne Moron) 1 MG tablet Take 1 mg by mouth 3 (three) times daily.   Yes Historical Provider, MD  aspirin EC 81 MG tablet Take 81 mg by mouth daily.   Yes Historical Provider, MD  Fluticasone-Salmeterol (ADVAIR DISKUS) 250-50 MCG/DOSE AEPB Inhale 1 puff into the lungs every 12 (twelve) hours.     Yes Historical Provider, MD  oxyCODONE-acetaminophen (PERCOCET) 10-325 MG per tablet Take 1 tablet by mouth 4 (four) times daily.   Yes Historical Provider, MD  OXYGEN Inhale 2 L into the lungs at bedtime.   Yes Historical Provider, MD  amoxicillin-clavulanate (AUGMENTIN) 875-125 MG per tablet Take 1 tablet by mouth 2 (two) times daily. Patient not taking: Reported on 07/01/2014 06/25/13   Sinda Du, MD  methylPREDNIsolone (MEDROL [facility]) 4 MG tablet follow package directions Patient not taking: Reported on 07/01/2014 06/25/13   Sinda Du, MD  Oxycodone HCl 10 MG TABS Take 1 tablet (10 mg total) by mouth 4 (four) times daily as needed (pain). Patient not taking: Reported on 07/01/2014 06/25/13   Sinda Du, MD   Physical Exam: Danley Danker  Vitals:   07/01/14 1751 07/01/14 1800 07/01/14 1804 07/01/14 1819  BP:   120/92 128/85  Pulse:  100 98 107  Temp:   98 F (36.7 C) 98.1 F (36.7 C)  TempSrc:   Oral Oral  Resp:   20   Height:    5\' 8"  (1.727 m)  Weight:    59.421 kg (131 lb)  SpO2: 90% 99% 99% 98%    Wt Readings from Last 3 Encounters:  07/01/14 59.421 kg (131 lb)  06/25/13 66.4 kg (146 lb 6.2 oz)  04/04/13 61.236 kg (135 lb)    General:  Appears calm and comfortable. He does not appear to have increased work of breathing at the present time. There is no peripheral or central cyanosis. He is not toxic or  septic. He does not appear to have chest pain presently. Eyes: PERRL, normal lids, irises & conjunctiva ENT: grossly normal hearing, lips & tongue Neck: no LAD, masses or thyromegaly Cardiovascular: RRR, no m/r/g. No LE edema. Telemetry: SR, no arrhythmias  Respiratory: Bilateral wheezing, somewhat tight. There are no crackles or bronchial breathing. Abdomen: soft, ntnd Skin: no rash or induration seen on limited exam Musculoskeletal: grossly normal tone BUE/BLE Psychiatric: grossly normal mood and affect, speech fluent and appropriate Neurologic: grossly non-focal.          Labs on Admission:  Basic Metabolic Panel:  Recent Labs Lab 07/01/14 1549  NA 134*  K 3.9  CL 95*  CO2 26  GLUCOSE 109*  BUN 12  CREATININE 0.58*  CALCIUM 9.5   Liver Function Tests: No results for input(s): AST, ALT, ALKPHOS, BILITOT, PROT, ALBUMIN in the last 168 hours. No results for input(s): LIPASE, AMYLASE in the last 168 hours. No results for input(s): AMMONIA in the last 168 hours. CBC:  Recent Labs Lab 07/01/14 1549  WBC 18.2*  NEUTROABS 15.0*  HGB 18.0*  HCT 53.6*  MCV 90.7  PLT 270   Cardiac Enzymes: No results for input(s): CKTOTAL, CKMB, CKMBINDEX, TROPONINI in the last 168 hours.  BNP (last 3 results) No results for input(s): BNP in the last 8760 hours.  ProBNP (last 3 results) No results for input(s): PROBNP in the last 8760 hours.  CBG: No results for input(s): GLUCAP in the last 168 hours.  Radiological Exams on Admission: Dg Chest 2 View  07/01/2014   CLINICAL DATA:  Chest pain, shortness of breath.  EXAM: CHEST  2 VIEW  COMPARISON:  Jun 22, 2013.  FINDINGS: The heart size and mediastinal contours are within normal limits. No pneumothorax or pleural effusion is noted. Hyperexpansion of the lungs is noted consistent with chronic obstructive pulmonary disease. Stable diffuse interstitial densities are noted throughout both lungs most consistent with emphysematous  disease. Stable calcified granuloma is noted in right lung The visualized skeletal structures are unremarkable.  IMPRESSION: Findings consistent with chronic obstructive pulmonary disease. No acute cardiopulmonary abnormality seen.   Electronically Signed   By: Marijo Conception, M.D.   On: 07/01/2014 13:51    EKG: Independently reviewed. Sinus rhythm with T-wave inversions anteriorly. In comparison to previous elect cardiac exam in 2015, there are no significant changes. There is no acute ST-T wave changes.  Assessment/Plan   1. COPD exacerbation. He will be treated with intravenous steroids  and intravenous antibodies. 2. Chest pain at rest. Etiology is not entirely clear. We will cycle cardiac enzymes. He may need cardiology consultation. 3. Polycythemia. His hemoglobin is significantly elevated at 18.0. He does look somewhat dehydrated  as he has had poor by mouth intake in the last few days. He'll be given intravenous fluids and we will recheck his CBC. 4. Ongoing tobacco abuse.  Further recommendations will depend on patient's hospital progress  Code Status: Full code.  DVT Prophylaxis: Heparin.  Family Communication: I discussed the plan with the patient at the bedside.   Disposition Plan: Home when medically stable.  Time spent: 60 minutes.  Doree Albee [facility] Pager [phone].

## 2014-07-01 NOTE — ED Notes (Signed)
Left sided back pain and sob since Saturday.

## 2014-07-01 NOTE — ED Provider Notes (Signed)
CSN: [id number]     Arrival date & time 07/01/14  1304 History   First MD Initiated Contact with Patient 07/01/14 1531     Chief Complaint  Patient presents with  . Shortness of Breath     Patient is a 49 y.o. male presenting with shortness of breath. The history is provided by the patient.  Shortness of Breath Severity:  Moderate Onset quality:  Gradual Duration:  3 days Timing:  Intermittent Progression:  Worsening Chronicity:  Recurrent Relieved by:  Nothing Worsened by:  Nothing tried Associated symptoms: chest pain, cough and wheezing   Associated symptoms: no fever and no vomiting   Pt reports cough/wheezing/SOB for past 3 days He reports h/o COPD and this is similar to prior episodes He is on home oxygen 2L He also reports low back pain due to cough He also reports abdominal pain due to coughing He also reports left chest pressure intermittently for past several days, last episode earlier today prior to ER visit No active CP at this time No pleuritic CP reported Past Medical History  Diagnosis Date  . COPD (chronic obstructive pulmonary disease)     Severe emphysema, radiographically.  . Hypercholesterolemia   . S/P endoscopy March 2012    reflux esophagitis, no ulcerations  . S/P colonoscopy March 2012    normal  . Pulmonary nodule 07/04/2011   Past Surgical History  Procedure Laterality Date  . Cholecystectomy    . Appendectomy    . Right inguinal hernia repair    . Hernia repair    . Knee surgery     Family History  Problem Relation Age of Onset  . Cirrhosis Father     deceased, ETOH cirrhosis  . Stomach cancer      aunt   History  Substance Use Topics  . Smoking status: Former Smoker -- 0.50 packs/day for 30 years    Types: Cigarettes  . Smokeless tobacco: Never Used  . Alcohol Use: 0.5 oz/week    1 Standard drinks or equivalent per week     Comment: occ    Review of Systems  Constitutional: Negative for fever.  Respiratory: Positive for  cough, shortness of breath and wheezing.   Cardiovascular: Positive for chest pain.  Gastrointestinal: Negative for vomiting.  All other systems reviewed and are negative.     Allergies  Codeine  Home Medications   Prior to Admission medications   Medication Sig Start Date End Date Taking? Authorizing Provider  albuterol (PROVENTIL) (5 MG/ML) 0.5% nebulizer solution Take 0.5 mLs (2.5 mg total) by nebulization every 4 (four) hours as needed for wheezing or shortness of breath. 04/18/12   Noemi Chapel, MD  ALPRAZolam Duanne Moron) 1 MG tablet Take 1 mg by mouth 3 (three) times daily.    Historical Provider, MD  amoxicillin-clavulanate (AUGMENTIN) 875-125 MG per tablet Take 1 tablet by mouth 2 (two) times daily. 06/25/13   Sinda Du, MD  aspirin EC 81 MG tablet Take 81 mg by mouth daily.    Historical Provider, MD  Fluticasone-Salmeterol (ADVAIR DISKUS) 250-50 MCG/DOSE AEPB Inhale 1 puff into the lungs every 12 (twelve) hours.      Historical Provider, MD  methylPREDNIsolone (MEDROL DOSPACK) 4 MG tablet follow package directions 06/25/13   Sinda Du, MD  Oxycodone HCl 10 MG TABS Take 1 tablet (10 mg total) by mouth 4 (four) times daily as needed (pain). 06/25/13   Sinda Du, MD   BP 130/94 mmHg  Pulse 106  Temp(Src) 98.2 F (  36.8 C) (Oral)  Resp 24  Ht 5\' 8"  (1.727 m)  Wt 131 lb (59.421 kg)  BMI 19.92 kg/m2  SpO2 97% Physical Exam CONSTITUTIONAL: thin, ill appearing HEAD: Normocephalic/atraumatic EYES: EOMI/PERRL ENMT: Mucous membranes moist NECK: supple no meningeal signs SPINE/BACK:entire spine nontender CV: S1/S2 noted, no murmurs/rubs/gallops noted LUNGS: wheezing bilaterally, tachypnea noted ABDOMEN: soft, nontender, no rebound or guarding, bowel sounds noted throughout abdomen GU:no cva tenderness NEURO: Pt is awake/alert/appropriate, moves all extremitiesx4.  No facial droop.   EXTREMITIES: pulses normal/equal, full ROM, no LE edema or tenderness SKIN: warm,  color normal PSYCH: no abnormalities of mood noted, alert and oriented to situation  ED Course  Procedures  Medications  albuterol (PROVENTIL) (2.5 MG/3ML) 0.083% nebulizer solution 5 mg (not administered)  aspirin chewable tablet 324 mg (not administered)  oxyCODONE-acetaminophen (PERCOCET/ROXICET) 5-325 MG per tablet 2 tablet (not administered)  ipratropium-albuterol (DUONEB) 0.5-2.5 (3) MG/3ML nebulizer solution 3 mL (3 mLs Nebulization Given 07/01/14 1615)  albuterol (PROVENTIL) (2.5 MG/3ML) 0.083% nebulizer solution 2.5 mg (2.5 mg Nebulization Given 07/01/14 1615)  predniSONE (DELTASONE) tablet 60 mg (60 mg Oral Given 07/01/14 1610)     4:21 PM Pt with likely COPD exacerbation He also mentions episodes of CP (none currently) with abnormal EKG/changed from prior Initial troponin negative He is without CP at this time Will treat COPD for now and monitor 4:53 PM Pt with continued SOB He denies any active CP Will admit for further evaluation of his COPD and also his CP/abnormal ekg D/w dr Anastasio Champion, will admit BP 132/86 mmHg  Pulse 96  Temp(Src) 98.2 F (36.8 C) (Oral)  Resp 21  Ht 5\' 8"  (1.727 m)  Wt 131 lb (59.421 kg)  BMI 19.92 kg/m2  SpO2 100%  Labs Review Labs Reviewed  BASIC METABOLIC PANEL - Abnormal; Notable for the following:    Sodium 134 (*)    Chloride 95 (*)    Glucose, Bld 109 (*)    Creatinine, Ser 0.58 (*)    All other components within normal limits  CBC WITH DIFFERENTIAL/PLATELET - Abnormal; Notable for the following:    WBC 18.2 (*)    RBC 5.91 (*)    Hemoglobin 18.0 (*)    HCT 53.6 (*)    Neutrophils Relative % 82 (*)    Neutro Abs 15.0 (*)    Lymphocytes Relative 7 (*)    Monocytes Absolute 1.9 (*)    All other components within normal limits  I-STAT TROPOININ, ED    Imaging Review Dg Chest 2 View  07/01/2014   CLINICAL DATA:  Chest pain, shortness of breath.  EXAM: CHEST  2 VIEW  COMPARISON:  Jun 22, 2013.  FINDINGS: The heart size and  mediastinal contours are within normal limits. No pneumothorax or pleural effusion is noted. Hyperexpansion of the lungs is noted consistent with chronic obstructive pulmonary disease. Stable diffuse interstitial densities are noted throughout both lungs most consistent with emphysematous disease. Stable calcified granuloma is noted in right lung The visualized skeletal structures are unremarkable.  IMPRESSION: Findings consistent with chronic obstructive pulmonary disease. No acute cardiopulmonary abnormality seen.   Electronically Signed   By: Marijo Conception, M.D.   On: 07/01/2014 13:51   ED ECG REPORT   Date: 07/01/2014 1556  Rate: 102  Rhythm: sinus tachycardia  QRS Axis: normal  Intervals: normal  ST/T Wave abnormalities: t wave inversion  Conduction Disutrbances:none  Narrative Interpretation:   Old EKG Reviewed: changes noted    MDM   Final  diagnoses:  Chronic obstructive pulmonary disease with acute exacerbation  Chest pain, rule out acute myocardial infarction  Abnormal EKG    Nursing notes including past medical history and social history reviewed and considered in documentation Labs/vital reviewed myself and considered during evaluation xrays/imaging reviewed by myself and considered during evaluation     Ripley Fraise, MD 07/01/14 1653

## 2014-07-01 NOTE — ED Notes (Signed)
Report given to [place], RN for room 316.

## 2014-07-02 ENCOUNTER — Encounter (HOSPITAL_COMMUNITY): Payer: Self-pay | Admitting: Cardiology

## 2014-07-02 ENCOUNTER — Inpatient Hospital Stay (HOSPITAL_COMMUNITY): Payer: Medicaid Other

## 2014-07-02 DIAGNOSIS — R9431 Abnormal electrocardiogram [ECG] [EKG]: Secondary | ICD-10-CM

## 2014-07-02 DIAGNOSIS — E860 Dehydration: Secondary | ICD-10-CM | POA: Diagnosis present

## 2014-07-02 DIAGNOSIS — R079 Chest pain, unspecified: Secondary | ICD-10-CM

## 2014-07-02 LAB — COMPREHENSIVE METABOLIC PANEL
ALT: 17 U/L (ref 17–63)
AST: 21 U/L (ref 15–41)
Albumin: 3.2 g/dL — ABNORMAL LOW (ref 3.5–5.0)
Alkaline Phosphatase: 83 U/L (ref 38–126)
Anion gap: 4 — ABNORMAL LOW (ref 5–15)
BILIRUBIN TOTAL: 0.6 mg/dL (ref 0.3–1.2)
BUN: 13 mg/dL (ref 6–20)
CO2: 28 mmol/L (ref 22–32)
Calcium: 9 mg/dL (ref 8.9–10.3)
Chloride: 103 mmol/L (ref 101–111)
Creatinine, Ser: 0.48 mg/dL — ABNORMAL LOW (ref 0.61–1.24)
GFR calc Af Amer: >60 mL/min (ref >60)
GFR calc non Af Amer: >60 mL/min (ref >60)
GLUCOSE: 151 mg/dL — AB (ref 65–99)
Potassium: 4.4 mmol/L (ref 3.5–5.1)
Sodium: 135 mmol/L (ref 135–145)
Total Protein: 6.9 g/dL (ref 6.5–8.1)

## 2014-07-02 LAB — CBC
HCT: 45.7 % (ref 39.0–52.0)
HEMOGLOBIN: 14.8 g/dL (ref 13.0–17.0)
MCH: 29.6 pg (ref 26.0–34.0)
MCHC: 32.4 g/dL (ref 30.0–36.0)
MCV: 91.4 fL (ref 78.0–100.0)
Platelets: 296 10*3/uL (ref 150–400)
RBC: 5 MIL/uL (ref 4.22–5.81)
RDW: 14.4 % (ref 11.5–15.5)
WBC: 11.2 10*3/uL — ABNORMAL HIGH (ref 4.0–10.5)

## 2014-07-02 LAB — TROPONIN I
Troponin I: <0.03 ng/mL (ref <0.031)
Troponin I: <0.03 ng/mL (ref <0.031)

## 2014-07-02 LAB — GLUCOSE, CAPILLARY: GLUCOSE-CAPILLARY: 182 mg/dL — AB (ref 65–99)

## 2014-07-02 MED ORDER — ENSURE ENLIVE PO LIQD
237.0000 mL | Freq: Two times a day (BID) | ORAL | Status: DC
  Administered 2014-07-02: 237 mL via ORAL

## 2014-07-02 NOTE — Progress Notes (Signed)
Subjective: He says he feels better. His breathing is better. He has had left-sided chest pain and his EKG was abnormal. He ruled out for MI by troponin. He had been seen by a cardiologist about 3 years ago and it was felt that his pain was likely chest wall pain but apparently at that time he did not have an abnormal electrocardiogram. As far as his COPD exacerbation is concerned he says he feels better.  Objective: Vital signs in last 24 hours: Temp:  [97.6 F (36.4 C)-98.2 F (36.8 C)] 97.6 F (36.4 C) (05/25 0611) Pulse Rate:  [68-109] 68 (05/25 0611) Resp:  [18-24] 18 (05/25 0611) BP: (108-132)/(73-94) 108/73 mmHg (05/25 0611) SpO2:  [84 %-100 %] 84 % (05/25 0736) Weight:  [59.421 kg (131 lb)] 59.421 kg (131 lb) (05/24 1819) Weight change:  Last BM Date: 07/01/14  Intake/Output from previous day: 05/24 0701 - 05/25 0700 In: 1312 [I.V.:1312] Out: 500 [Urine:500]  PHYSICAL EXAM General appearance: alert, cooperative and no distress Resp: rhonchi bilaterally Cardio: regular rate and rhythm, S1, S2 normal, no murmur, click, rub or gallop GI: soft, non-tender; bowel sounds normal; no masses,  no organomegaly Extremities: extremities normal, atraumatic, no cyanosis or edema  Lab Results:  Results for orders placed or performed during the hospital encounter of 07/01/14 (from the past 48 hour(s))  Basic metabolic panel     Status: Abnormal   Collection Time: 07/01/14  3:49 PM  Result Value Ref Range   Sodium 134 (L) 135 - 145 mmol/L   Potassium 3.9 3.5 - 5.1 mmol/L   Chloride 95 (L) 101 - 111 mmol/L   CO2 26 22 - 32 mmol/L   Glucose, Bld 109 (H) 65 - 99 mg/dL   BUN 12 6 - 20 mg/dL   Creatinine, Ser 0.58 (L) 0.61 - 1.24 mg/dL   Calcium 9.5 8.9 - 10.3 mg/dL   GFR calc non Af Amer >60 >60 mL/min   GFR calc Af Amer >60 >60 mL/min    Comment: (NOTE) The eGFR has been calculated using the CKD EPI equation. This calculation has not been validated in all clinical  situations. eGFR's persistently <60 mL/min signify possible Chronic Kidney Disease.    Anion gap 13 5 - 15  CBC with Differential/Platelet     Status: Abnormal   Collection Time: 07/01/14  3:49 PM  Result Value Ref Range   WBC 18.2 (H) 4.0 - 10.5 K/uL   RBC 5.91 (H) 4.22 - 5.81 MIL/uL   Hemoglobin 18.0 (H) 13.0 - 17.0 g/dL   HCT 53.6 (H) 39.0 - 52.0 %   MCV 90.7 78.0 - 100.0 fL   MCH 30.5 26.0 - 34.0 pg   MCHC 33.6 30.0 - 36.0 g/dL   RDW 14.3 11.5 - 15.5 %   Platelets 270 150 - 400 K/uL   Neutrophils Relative % 82 (H) 43 - 77 %   Neutro Abs 15.0 (H) 1.7 - 7.7 K/uL   Lymphocytes Relative 7 (L) 12 - 46 %   Lymphs Abs 1.3 0.7 - 4.0 K/uL   Monocytes Relative 11 3 - 12 %   Monocytes Absolute 1.9 (H) 0.1 - 1.0 K/uL   Eosinophils Relative 0 0 - 5 %   Eosinophils Absolute 0.0 0.0 - 0.7 K/uL   Basophils Relative 0 0 - 1 %   Basophils Absolute 0.0 0.0 - 0.1 K/uL  Troponin I     Status: None   Collection Time: 07/01/14  3:49 PM  Result Value Ref  Range   Troponin I <0.03 <0.031 ng/mL    Comment:        NO INDICATION OF MYOCARDIAL INJURY.   I-stat troponin, ED     Status: None   Collection Time: 07/01/14  4:01 PM  Result Value Ref Range   Troponin i, poc 0.00 0.00 - 0.08 ng/mL   Comment 3            Comment: Due to the release kinetics of cTnI, a negative result within the first hours of the onset of symptoms does not rule out myocardial infarction with certainty. If myocardial infarction is still suspected, repeat the test at appropriate intervals.   Troponin I     Status: None   Collection Time: 07/02/14 12:38 AM  Result Value Ref Range   Troponin I <0.03 <0.031 ng/mL    Comment:        NO INDICATION OF MYOCARDIAL INJURY.   Troponin I     Status: None   Collection Time: 07/02/14  6:28 AM  Result Value Ref Range   Troponin I <0.03 <0.031 ng/mL    Comment:        NO INDICATION OF MYOCARDIAL INJURY.   Comprehensive metabolic panel     Status: Abnormal   Collection  Time: 07/02/14  6:28 AM  Result Value Ref Range   Sodium 135 135 - 145 mmol/L   Potassium 4.4 3.5 - 5.1 mmol/L   Chloride 103 101 - 111 mmol/L   CO2 28 22 - 32 mmol/L   Glucose, Bld 151 (H) 65 - 99 mg/dL   BUN 13 6 - 20 mg/dL   Creatinine, Ser 0.48 (L) 0.61 - 1.24 mg/dL   Calcium 9.0 8.9 - 10.3 mg/dL   Total Protein 6.9 6.5 - 8.1 g/dL   Albumin 3.2 (L) 3.5 - 5.0 g/dL   AST 21 15 - 41 U/L   ALT 17 17 - 63 U/L   Alkaline Phosphatase 83 38 - 126 U/L   Total Bilirubin 0.6 0.3 - 1.2 mg/dL   GFR calc non Af Amer >60 >60 mL/min   GFR calc Af Amer >60 >60 mL/min    Comment: (NOTE) The eGFR has been calculated using the CKD EPI equation. This calculation has not been validated in all clinical situations. eGFR's persistently <60 mL/min signify possible Chronic Kidney Disease.    Anion gap 4 (L) 5 - 15  CBC     Status: Abnormal   Collection Time: 07/02/14  6:28 AM  Result Value Ref Range   WBC 11.2 (H) 4.0 - 10.5 K/uL   RBC 5.00 4.22 - 5.81 MIL/uL   Hemoglobin 14.8 13.0 - 17.0 g/dL   HCT 45.7 39.0 - 52.0 %   MCV 91.4 78.0 - 100.0 fL   MCH 29.6 26.0 - 34.0 pg   MCHC 32.4 30.0 - 36.0 g/dL   RDW 14.4 11.5 - 15.5 %   Platelets 296 150 - 400 K/uL    ABGS No results for input(s): PHART, PO2ART, TCO2, HCO3 in the last 72 hours.  Invalid input(s): PCO2 CULTURES No results found for this or any previous visit (from the past 240 hour(s)). Studies/Results: Dg Chest 2 View  07/01/2014   CLINICAL DATA:  Chest pain, shortness of breath.  EXAM: CHEST  2 VIEW  COMPARISON:  Jun 22, 2013.  FINDINGS: The heart size and mediastinal contours are within normal limits. No pneumothorax or pleural effusion is noted. Hyperexpansion of the lungs is noted consistent with chronic obstructive  pulmonary disease. Stable diffuse interstitial densities are noted throughout both lungs most consistent with emphysematous disease. Stable calcified granuloma is noted in right lung The visualized skeletal structures are  unremarkable.  IMPRESSION: Findings consistent with chronic obstructive pulmonary disease. No acute cardiopulmonary abnormality seen.   Electronically Signed   By: Marijo Conception, M.D.   On: 07/01/2014 13:51    Medications:  Prior to Admission:  Prescriptions prior to admission  Medication Sig Dispense Refill Last Dose  . albuterol (PROVENTIL) (5 MG/ML) 0.5% nebulizer solution Take 0.5 mLs (2.5 mg total) by nebulization every 4 (four) hours as needed for wheezing or shortness of breath. 20 mL 12 07/01/2014 at Unknown time  . ALPRAZolam (XANAX) 1 MG tablet Take 1 mg by mouth 3 (three) times daily.   07/01/2014 at Unknown time  . aspirin EC 81 MG tablet Take 81 mg by mouth daily.   07/01/2014 at Unknown time  . Fluticasone-Salmeterol (ADVAIR DISKUS) 250-50 MCG/DOSE AEPB Inhale 1 puff into the lungs every 12 (twelve) hours.     06/30/2014 at Unknown time  . oxyCODONE-acetaminophen (PERCOCET) 10-325 MG per tablet Take 1 tablet by mouth 4 (four) times daily.   07/01/2014 at Unknown time  . OXYGEN Inhale 2 L into the lungs at bedtime.   06/30/2014 at Unknown time  . amoxicillin-clavulanate (AUGMENTIN) 875-125 MG per tablet Take 1 tablet by mouth 2 (two) times daily. (Patient not taking: Reported on 07/01/2014) 14 tablet 0   . methylPREDNIsolone (MEDROL DOSPACK) 4 MG tablet follow package directions (Patient not taking: Reported on 07/01/2014) 21 tablet 0   . Oxycodone HCl 10 MG TABS Take 1 tablet (10 mg total) by mouth 4 (four) times daily as needed (pain). (Patient not taking: Reported on 07/01/2014) 120 tablet 0    Scheduled: . albuterol  2.5 mg Nebulization QID  . ALPRAZolam  1 mg Oral TID  . aspirin EC  81 mg Oral Daily  . heparin  5,000 Units Subcutaneous 3 times per day  . levofloxacin (LEVAQUIN) IV  500 mg Intravenous Q24H  . methylPREDNISolone (SOLU-MEDROL) injection  125 mg Intravenous Q6H  . mometasone-formoterol  2 puff Inhalation BID  . sodium chloride  3 mL Intravenous Q12H   Continuous: .  sodium chloride 125 mL/hr at 07/02/14 0330   QAS:[id number], ondansetron **OR** ondansetron (ZOFRAN) IV, oxyCODONE-acetaminophen **AND** oxyCODONE  Assesment: He was admitted with COPD exacerbation. He is doing better. He has ongoing tobacco abuse.  He has chest pain which is pretty nonspecific and may be chest wall pain but with his abnormal electrocardiogram will request cardiology consultation. He has ruled out for MI  He had polycythemia which has resolved. I think this was related to dehydration  He has chronic low back pain  He has chronic anxiety Active Problems:   COPD exacerbation   Tobacco abuse   Chest pain at rest   Polycythemia    Plan: Continue with current treatments. Cardiology consultation.    LOS: 1 day   Cameron Villarreal L 07/02/2014, 8:33 AM

## 2014-07-02 NOTE — Progress Notes (Signed)
Initial Nutrition Assessment  DOCUMENTATION CODES:  Non-severe (moderate) malnutrition in context of chronic illness  INTERVENTION:  Ensure Enlive (each supplement provides 350kcal and 20 grams of protein)  NUTRITION DIAGNOSIS:  Increased nutrient needs related to chronic illness (severe COPD) as evidenced by estimated needs.   GOAL:  Patient will meet greater than or equal to 90% of their needs    MONITOR:  PO intake, Supplement acceptance  REASON FOR ASSESSMENT:  Malnutrition Screening Tool    ASSESSMENT: He has hx of severe COPD and is complaining today that it's difficult for him to eat when he's having difficulty breathing. For 2-3 days prior to admission he was nauseated and vomiting. He presented dehydrated, dyspneic with a productive cough.  He is eating better now: [employer] last night (chicken, potatoes and biscuit) this morning:eggs, grits and coffee.  Weight hx: Pt has 10% wt loss over the past year.  Physical: he has multiple areas of mild depletion of both muscle mass and  subcutaneous fat.  Height:  Ht Readings from Last 1 Encounters:  07/01/14 5\' 8"  (1.727 m)    Weight:  Wt Readings from Last 1 Encounters:  07/01/14 131 lb (59.421 kg)    Ideal Body Weight:  70 kg  Wt Readings from Last 10 Encounters:  07/01/14 131 lb (59.421 kg)  06/25/13 146 lb 6.2 oz (66.4 kg)  04/04/13 135 lb (61.236 kg)  12/15/11 152 lb (68.947 kg)  07/26/11 147 lb (66.679 kg)  07/02/11 143 lb 15.4 oz (65.3 kg)  06/17/10 154 lb (69.854 kg)  04/21/10 152 lb (68.947 kg)    BMI:  Body mass index is 19.92 kg/(m^2). normal range  Estimated Nutritional Needs:  Kcal:  1694-5038  Protein:  102-110 gr  Fluid:  1.8-2.1 liters daily  Skin:   intact   Diet Order:  Diet regular Room service appropriate?: Yes; Fluid consistency:: Thin  EDUCATION NEEDS: encouraged pt to focus on increasing quantity and quality of nutrition intake daily and add ONS BID espeically on days when  intake is decreased due to shortness of breath     Intake/Output Summary (Last 24 hours) at 07/02/14 1330 Last data filed at 07/02/14 0611  Gross per 24 hour  Intake   1312 ml  Output    500 ml  Net    812 ml    Last BM:  5/24  Colman Cater MS,RD,CSG,LDN Office: [phone] Pager: [phone]

## 2014-07-02 NOTE — Consult Note (Signed)
Reason for Consult: Chest pain, abn EKG Referring Physician: Dr. Luan Pulling Consulting Cardiologist: Dr. Veleta Miners is an 49 y.o. male patient with severe COPD admitted with 3-4 day history of worsening dyspnea and green productive cough. He also complained of chest pain at rest and had an abnormal EKG prompting admission. He was evaluated for atypical chest pain by Dr. Verl Blalock in 2013 felt to be chest wall pain. 2Decho 2012 EF 50-55% with no WMA. EKG's show NSR with poor R wave progression and TWI V1-V3, more pronounced from prior tracings. Troponins negative, WBC 18, Hbg was 18 on admission - now 14, felt secondary to dehydration. Also started on home O2 last week.  Patient complains of "sharp shooting" chest pain that is fleeting and occurs at rest. Has been going on for a couple of months. On further questioning he complains of occasional chest ache that radiates down his left arm. He says it only lasts a few minutes but has been going on for a couple of years. He did have it right before coming to the ER. He thinks it's from his COPD. He is very inactive and gets short of breath quite easily. He still smokes 5 cigs/day, HTN, HLD, no DM or family history of heart disease.  Past Medical History  Diagnosis Date  . COPD (chronic obstructive pulmonary disease)     Severe centrilobular emphysema  . Hypercholesterolemia   . S/P endoscopy March 2012    Reflux esophagitis, no ulcerations  . S/P colonoscopy March 2012    Normal  . Pulmonary nodule 07/04/2011    Past Surgical History  Procedure Laterality Date  . Cholecystectomy    . Appendectomy    . Right inguinal hernia repair    . Hernia repair    . Knee surgery      Family History  Problem Relation Age of Onset  . Cirrhosis Father     Deceased, ETOH cirrhosis  . Stomach cancer      Aunt    Social History:  reports that he has quit smoking. His smoking use included Cigarettes. He has a 15 pack-year smoking history.  He has never used smokeless tobacco. He reports that he drinks about 0.6 oz of alcohol per week. He reports that he does not use illicit drugs.  Allergies:  Allergies  Allergen Reactions  . Codeine Itching and Nausea Only    Medications:  Scheduled Meds: . albuterol  2.5 mg Nebulization QID  . ALPRAZolam  1 mg Oral TID  . aspirin EC  81 mg Oral Daily  . heparin  5,000 Units Subcutaneous 3 times per day  . levofloxacin (LEVAQUIN) IV  500 mg Intravenous Q24H  . methylPREDNISolone (SOLU-MEDROL) injection  125 mg Intravenous Q6H  . mometasone-formoterol  2 puff Inhalation BID  . sodium chloride  3 mL Intravenous Q12H   Continuous Infusions: . sodium chloride 125 mL/hr at 07/02/14 0330   PRN Meds:.albuterol, ondansetron **OR** ondansetron (ZOFRAN) IV, oxyCODONE-acetaminophen **AND** oxyCODONE   Results for orders placed or performed during the hospital encounter of 07/01/14 (from the past 48 hour(s))  Basic metabolic panel     Status: Abnormal   Collection Time: 07/01/14  3:49 PM  Result Value Ref Range   Sodium 134 (L) 135 - 145 mmol/L   Potassium 3.9 3.5 - 5.1 mmol/L   Chloride 95 (L) 101 - 111 mmol/L   CO2 26 22 - 32 mmol/L   Glucose, Bld 109 (H) 65 -  99 mg/dL   BUN 12 6 - 20 mg/dL   Creatinine, Ser 0.58 (L) 0.61 - 1.24 mg/dL   Calcium 9.5 8.9 - 10.3 mg/dL   GFR calc non Af Amer >60 >60 mL/min   GFR calc Af Amer >60 >60 mL/min    Comment: (NOTE) The eGFR has been calculated using the CKD EPI equation. This calculation has not been validated in all clinical situations. eGFR's persistently <60 mL/min signify possible Chronic Kidney Disease.    Anion gap 13 5 - 15  CBC with Differential/Platelet     Status: Abnormal   Collection Time: 07/01/14  3:49 PM  Result Value Ref Range   WBC 18.2 (H) 4.0 - 10.5 K/uL   RBC 5.91 (H) 4.22 - 5.81 MIL/uL   Hemoglobin 18.0 (H) 13.0 - 17.0 g/dL   HCT 53.6 (H) 39.0 - 52.0 %   MCV 90.7 78.0 - 100.0 fL   MCH 30.5 26.0 - 34.0 pg   MCHC  33.6 30.0 - 36.0 g/dL   RDW 14.3 11.5 - 15.5 %   Platelets 270 150 - 400 K/uL   Neutrophils Relative % 82 (H) 43 - 77 %   Neutro Abs 15.0 (H) 1.7 - 7.7 K/uL   Lymphocytes Relative 7 (L) 12 - 46 %   Lymphs Abs 1.3 0.7 - 4.0 K/uL   Monocytes Relative 11 3 - 12 %   Monocytes Absolute 1.9 (H) 0.1 - 1.0 K/uL   Eosinophils Relative 0 0 - 5 %   Eosinophils Absolute 0.0 0.0 - 0.7 K/uL   Basophils Relative 0 0 - 1 %   Basophils Absolute 0.0 0.0 - 0.1 K/uL  Troponin I     Status: None   Collection Time: 07/01/14  3:49 PM  Result Value Ref Range   Troponin I <0.03 <0.031 ng/mL    Comment:        NO INDICATION OF MYOCARDIAL INJURY.   I-stat troponin, ED     Status: None   Collection Time: 07/01/14  4:01 PM  Result Value Ref Range   Troponin i, poc 0.00 0.00 - 0.08 ng/mL   Comment 3            Comment: Due to the release kinetics of cTnI, a negative result within the first hours of the onset of symptoms does not rule out myocardial infarction with certainty. If myocardial infarction is still suspected, repeat the test at appropriate intervals.   Troponin I     Status: None   Collection Time: 07/02/14 12:38 AM  Result Value Ref Range   Troponin I <0.03 <0.031 ng/mL    Comment:        NO INDICATION OF MYOCARDIAL INJURY.   Troponin I     Status: None   Collection Time: 07/02/14  6:28 AM  Result Value Ref Range   Troponin I <0.03 <0.031 ng/mL    Comment:        NO INDICATION OF MYOCARDIAL INJURY.   Comprehensive metabolic panel     Status: Abnormal   Collection Time: 07/02/14  6:28 AM  Result Value Ref Range   Sodium 135 135 - 145 mmol/L   Potassium 4.4 3.5 - 5.1 mmol/L   Chloride 103 101 - 111 mmol/L   CO2 28 22 - 32 mmol/L   Glucose, Bld 151 (H) 65 - 99 mg/dL   BUN 13 6 - 20 mg/dL   Creatinine, Ser 0.48 (L) 0.61 - 1.24 mg/dL   Calcium 9.0 8.9 -  10.3 mg/dL   Total Protein 6.9 6.5 - 8.1 g/dL   Albumin 3.2 (L) 3.5 - 5.0 g/dL   AST 21 15 - 41 U/L   ALT 17 17 - 63 U/L    Alkaline Phosphatase 83 38 - 126 U/L   Total Bilirubin 0.6 0.3 - 1.2 mg/dL   GFR calc non Af Amer >60 >60 mL/min   GFR calc Af Amer >60 >60 mL/min    Comment: (NOTE) The eGFR has been calculated using the CKD EPI equation. This calculation has not been validated in all clinical situations. eGFR's persistently <60 mL/min signify possible Chronic Kidney Disease.    Anion gap 4 (L) 5 - 15  CBC     Status: Abnormal   Collection Time: 07/02/14  6:28 AM  Result Value Ref Range   WBC 11.2 (H) 4.0 - 10.5 K/uL   RBC 5.00 4.22 - 5.81 MIL/uL   Hemoglobin 14.8 13.0 - 17.0 g/dL   HCT 45.7 39.0 - 52.0 %   MCV 91.4 78.0 - 100.0 fL   MCH 29.6 26.0 - 34.0 pg   MCHC 32.4 30.0 - 36.0 g/dL   RDW 14.4 11.5 - 15.5 %   Platelets 296 150 - 400 K/uL    Dg Chest 2 View  07/01/2014   CLINICAL DATA:  Chest pain, shortness of breath.  EXAM: CHEST  2 VIEW  COMPARISON:  Jun 22, 2013.  FINDINGS: The heart size and mediastinal contours are within normal limits. No pneumothorax or pleural effusion is noted. Hyperexpansion of the lungs is noted consistent with chronic obstructive pulmonary disease. Stable diffuse interstitial densities are noted throughout both lungs most consistent with emphysematous disease. Stable calcified granuloma is noted in right lung The visualized skeletal structures are unremarkable.  IMPRESSION: Findings consistent with chronic obstructive pulmonary disease. No acute cardiopulmonary abnormality seen.   Electronically Signed   By: Marijo Conception, M.D.   On: 07/01/2014 13:51    ROS  See HPI Eyes: Negative Ears:Negative for hearing loss, tinnitus Cardiovascular: Postive for chest pain,  dyspnea, dyspnea on exertion, negative palpitations,near-syncope, and syncope,edema, claudication, cyanosis,.  Respiratory:   Positive for cough,  shortness of breath, sleep disturbances due to breathing, sputum production and wheezing.   Endocrine: Negative for cold intolerance and heat intolerance.   Hematologic/Lymphatic: Negative for adenopathy and bleeding problem. Does not bruise/bleed easily.  Musculoskeletal: Negative.   Gastrointestinal: Negative for nausea, vomiting, reflux, abdominal pain, diarrhea, constipation.   Genitourinary: Negative for bladder incontinence, dysuria, flank pain, frequency, hematuria, hesitancy, nocturia and urgency.  Neurological: Negative.  Allergic/Immunologic: Negative for environmental allergies.  Blood pressure 108/73, pulse 68, temperature 97.6 F (36.4 C), temperature source Oral, resp. rate 18, height $RemoveBe'5\' 8"'[id number]$  (1.727 m), weight 131 lb (59.421 kg), SpO2 84 %. Physical Exam  PHYSICAL EXAM: Thin, in no acute distress. Neck: No JVD, HJR, Bruit, or thyroid enlargement Lungs:Decreased breath sounds with  Wheezing and rhonchithroughout Cardiovascular: RRR, PMI not displaced, heart sounds normal, no murmurs, gallops, bruit, thrill, or heave. Abdomen: BS normal. Soft without organomegaly, masses, lesions or tenderness. Extremities: without cyanosis, clubbing or edema. Good distal pulses bilateral SKin: Warm, no lesions or rashes  Musculoskeletal: No deformities Neuro: no focal signs  2Decho 2012: Study Conclusions  - Left ventricle: The cavity size was normal. Wall thickness was   normal except for borderline septal hypertrophy. Systolic function   was low normal. The estimated ejection fraction was in the range   of 50% to 55%. Wall motion was  normal; there were no regional wall   motion abnormalities. - Atrial septum: No defect or patent foramen ovale was identified.   Assessment/Plan: Chest pain: Typical and atypical features, now with more prominent TWI on EKG and multiple cardiac risk factors. Should eventually have Myoview (possibly dobutamine with severe COPD) to further assess once stable from pulmonary standpoint. Repeat 2Decho.  COPD exacerbation/pneumonia: recently put on home O2-still smokes  Dehydration:resolved  HTN BP stable here,  not on treatment  HLD not treated.  Ermalinda Barrios, PA-C  07/02/2014, 9:49 AM    Attending note:  Patient seen and examined. Agree with above assessment by Ms. Bonnell Public PA-C. Mr. Polo presents with progressive shortness of breath, recent cough and subjective fevers in the setting of severe COPD with ongoing tobacco abuse, recently placed on home oxygen. He is being managed for COPD exacerbation by Dr. Luan Pulling. In addition to the above symptoms, he has also been experiencing an intermittent typically "sharp and shooting" left sided chest pain for several weeks on a baseline more prolonged history of intermittent chest aching with some radiation to the left arm. He does have multiple cardiac risk factors, no prior diagnosis of obstructive CAD however. ECG shows sinus rhythm with rightward axis and anterior T-wave inversions that are more prominent compared to prior tracings. On examination he is in no distress, significantly diminished lung fields, distant regular heart sounds, no pitting edema peripherally. We will eventually plan on arranging a follow-up ischemic evaluation, probably dobutamine Myoview in light of his severe COPD, but would first have him get more stable from a pulmonary perspective. Cardiac testing will also include an echocardiogram which is being arranged.  Satira Sark, M.D., F.A.C.C.

## 2014-07-02 NOTE — Care Management Note (Signed)
Case Management Note  Patient Details  Name: Cameron Villarreal MRN: [id number] Date of Birth: 1965-12-14  Expected Discharge Date:                  Expected Discharge Plan:  Home/Self Care  In-House Referral:  NA  Discharge planning Services  CM Consult  Post Acute Care Choice:  NA Choice offered to:  NA  DME Arranged:    DME Agency:     HH Arranged:    [place] Agency:     Status of Service:  Completed, signed off  Medicare Important Message Given:    Date Medicare IM Given:    Medicare IM give by:    Date Additional Medicare IM Given:    Additional Medicare Important Message give by:     If discussed at [place] of Stay Meetings, dates discussed:    Additional Comments: Pt is from home, lives with and cares for mother. Pt is independent at baseline. Pt recently got home O2 through [place] and neb machine Pt has no HH services prior to admission. Pt plans to DC home with self care. No CM needs anticipated.   Sherald Barge, RN 07/02/2014, 1:07 PM

## 2014-07-03 DIAGNOSIS — E440 Moderate protein-calorie malnutrition: Secondary | ICD-10-CM | POA: Insufficient documentation

## 2014-07-03 DIAGNOSIS — R072 Precordial pain: Secondary | ICD-10-CM

## 2014-07-03 MED ORDER — PREDNISONE 10 MG (21) PO TBPK: 10.0000 mg | ORAL_TABLET | Freq: Every day | ORAL | Status: DC

## 2014-07-03 MED ORDER — LEVOFLOXACIN 500 MG PO TABS
500.0000 mg | ORAL_TABLET | Freq: Every day | ORAL | Status: DC
Start: 2014-07-03 — End: 2014-08-24

## 2014-07-03 NOTE — Progress Notes (Signed)
Reviewed discharged instruction with patient. No distress noted ambulated to  [place] refused to wait for wheelchair.

## 2014-07-03 NOTE — Progress Notes (Signed)
Subjective: He feels much better and wants to go home. He is not having any chest pain. He is still coughing up some sputum.  Objective: Vital signs in last 24 hours: Temp:  [97.7 F (36.5 C)-98 F (36.7 C)] 98 F (36.7 C) (05/26 0511) Pulse Rate:  [66-96] 66 (05/26 0511) Resp:  [20] 20 (05/26 0511) BP: (96-114)/(62-74) 114/74 mmHg (05/26 0511) SpO2:  [90 %-99 %] 93 % (05/26 0749) Weight change:  Last BM Date: 07/01/14  Intake/Output from previous day: 05/25 0701 - 05/26 0700 In: 4021.3 [P.O.:840; I.V.:2981.3; IV Piggyback:200] Out: 1050 [Urine:1050]  PHYSICAL EXAM General appearance: alert, cooperative and no distress Resp: clear to auscultation bilaterally Cardio: regular rate and rhythm, S1, S2 normal, no murmur, click, rub or gallop GI: soft, non-tender; bowel sounds normal; no masses,  no organomegaly Extremities: extremities normal, atraumatic, no cyanosis or edema  Lab Results:  Results for orders placed or performed during the hospital encounter of 07/01/14 (from the past 48 hour(s))  Basic metabolic panel     Status: Abnormal   Collection Time: 07/01/14  3:49 PM  Result Value Ref Range   Sodium 134 (L) 135 - 145 mmol/L   Potassium 3.9 3.5 - 5.1 mmol/L   Chloride 95 (L) 101 - 111 mmol/L   CO2 26 22 - 32 mmol/L   Glucose, Bld 109 (H) 65 - 99 mg/dL   BUN 12 6 - 20 mg/dL   Creatinine, Ser 0.58 (L) 0.61 - 1.24 mg/dL   Calcium 9.5 8.9 - 10.3 mg/dL   GFR calc non Af Amer >60 >60 mL/min   GFR calc Af Amer >60 >60 mL/min    Comment: (NOTE) The eGFR has been calculated using the CKD EPI equation. This calculation has not been validated in all clinical situations. eGFR's persistently <60 mL/min signify possible Chronic Kidney Disease.    Anion gap 13 5 - 15  CBC with Differential/Platelet     Status: Abnormal   Collection Time: 07/01/14  3:49 PM  Result Value Ref Range   WBC 18.2 (H) 4.0 - 10.5 K/uL   RBC 5.91 (H) 4.22 - 5.81 MIL/uL   Hemoglobin 18.0 (H) 13.0 -  17.0 g/dL   HCT 53.6 (H) 39.0 - 52.0 %   MCV 90.7 78.0 - 100.0 fL   MCH 30.5 26.0 - 34.0 pg   MCHC 33.6 30.0 - 36.0 g/dL   RDW 14.3 11.5 - 15.5 %   Platelets 270 150 - 400 K/uL   Neutrophils Relative % 82 (H) 43 - 77 %   Neutro Abs 15.0 (H) 1.7 - 7.7 K/uL   Lymphocytes Relative 7 (L) 12 - 46 %   Lymphs Abs 1.3 0.7 - 4.0 K/uL   Monocytes Relative 11 3 - 12 %   Monocytes Absolute 1.9 (H) 0.1 - 1.0 K/uL   Eosinophils Relative 0 0 - 5 %   Eosinophils Absolute 0.0 0.0 - 0.7 K/uL   Basophils Relative 0 0 - 1 %   Basophils Absolute 0.0 0.0 - 0.1 K/uL  Troponin I     Status: None   Collection Time: 07/01/14  3:49 PM  Result Value Ref Range   Troponin I <0.03 <0.031 ng/mL    Comment:        NO INDICATION OF MYOCARDIAL INJURY.   I-stat troponin, ED     Status: None   Collection Time: 07/01/14  4:01 PM  Result Value Ref Range   Troponin i, poc 0.00 0.00 - 0.08 ng/mL  Comment 3            Comment: Due to the release kinetics of cTnI, a negative result within the first hours of the onset of symptoms does not rule out myocardial infarction with certainty. If myocardial infarction is still suspected, repeat the test at appropriate intervals.   Troponin I     Status: None   Collection Time: 07/02/14 12:38 AM  Result Value Ref Range   Troponin I <0.03 <0.031 ng/mL    Comment:        NO INDICATION OF MYOCARDIAL INJURY.   Troponin I     Status: None   Collection Time: 07/02/14  6:28 AM  Result Value Ref Range   Troponin I <0.03 <0.031 ng/mL    Comment:        NO INDICATION OF MYOCARDIAL INJURY.   Comprehensive metabolic panel     Status: Abnormal   Collection Time: 07/02/14  6:28 AM  Result Value Ref Range   Sodium 135 135 - 145 mmol/L   Potassium 4.4 3.5 - 5.1 mmol/L   Chloride 103 101 - 111 mmol/L   CO2 28 22 - 32 mmol/L   Glucose, Bld 151 (H) 65 - 99 mg/dL   BUN 13 6 - 20 mg/dL   Creatinine, Ser 0.48 (L) 0.61 - 1.24 mg/dL   Calcium 9.0 8.9 - 10.3 mg/dL   Total Protein  6.9 6.5 - 8.1 g/dL   Albumin 3.2 (L) 3.5 - 5.0 g/dL   AST 21 15 - 41 U/L   ALT 17 17 - 63 U/L   Alkaline Phosphatase 83 38 - 126 U/L   Total Bilirubin 0.6 0.3 - 1.2 mg/dL   GFR calc non Af Amer >60 >60 mL/min   GFR calc Af Amer >60 >60 mL/min    Comment: (NOTE) The eGFR has been calculated using the CKD EPI equation. This calculation has not been validated in all clinical situations. eGFR's persistently <60 mL/min signify possible Chronic Kidney Disease.    Anion gap 4 (L) 5 - 15  CBC     Status: Abnormal   Collection Time: 07/02/14  6:28 AM  Result Value Ref Range   WBC 11.2 (H) 4.0 - 10.5 K/uL   RBC 5.00 4.22 - 5.81 MIL/uL   Hemoglobin 14.8 13.0 - 17.0 g/dL   HCT 45.7 39.0 - 52.0 %   MCV 91.4 78.0 - 100.0 fL   MCH 29.6 26.0 - 34.0 pg   MCHC 32.4 30.0 - 36.0 g/dL   RDW 14.4 11.5 - 15.5 %   Platelets 296 150 - 400 K/uL  Glucose, capillary     Status: Abnormal   Collection Time: 07/02/14  9:03 PM  Result Value Ref Range   Glucose-Capillary 182 (H) 65 - 99 mg/dL   Comment 1 Notify RN    Comment 2 Document in Chart     ABGS No results for input(s): PHART, PO2ART, TCO2, HCO3 in the last 72 hours.  Invalid input(s): PCO2 CULTURES No results found for this or any previous visit (from the past 240 hour(s)). Studies/Results: Dg Chest 2 View  07/01/2014   CLINICAL DATA:  Chest pain, shortness of breath.  EXAM: CHEST  2 VIEW  COMPARISON:  Jun 22, 2013.  FINDINGS: The heart size and mediastinal contours are within normal limits. No pneumothorax or pleural effusion is noted. Hyperexpansion of the lungs is noted consistent with chronic obstructive pulmonary disease. Stable diffuse interstitial densities are noted throughout both lungs most consistent with  emphysematous disease. Stable calcified granuloma is noted in right lung The visualized skeletal structures are unremarkable.  IMPRESSION: Findings consistent with chronic obstructive pulmonary disease. No acute cardiopulmonary  abnormality seen.   Electronically Signed   By: Marijo Conception, M.D.   On: 07/01/2014 13:51    Medications:  Prior to Admission:  Prescriptions prior to admission  Medication Sig Dispense Refill Last Dose  . albuterol (PROVENTIL) (5 MG/ML) 0.5% nebulizer solution Take 0.5 mLs (2.5 mg total) by nebulization every 4 (four) hours as needed for wheezing or shortness of breath. 20 mL 12 07/01/2014 at Unknown time  . ALPRAZolam (XANAX) 1 MG tablet Take 1 mg by mouth 3 (three) times daily.   07/01/2014 at Unknown time  . aspirin EC 81 MG tablet Take 81 mg by mouth daily.   07/01/2014 at Unknown time  . Fluticasone-Salmeterol (ADVAIR DISKUS) 250-50 MCG/DOSE AEPB Inhale 1 puff into the lungs every 12 (twelve) hours.     06/30/2014 at Unknown time  . oxyCODONE-acetaminophen (PERCOCET) 10-325 MG per tablet Take 1 tablet by mouth 4 (four) times daily.   07/01/2014 at Unknown time  . OXYGEN Inhale 2 L into the lungs at bedtime.   06/30/2014 at Unknown time  . amoxicillin-clavulanate (AUGMENTIN) 875-125 MG per tablet Take 1 tablet by mouth 2 (two) times daily. (Patient not taking: Reported on 07/01/2014) 14 tablet 0   . methylPREDNIsolone (MEDROL DOSPACK) 4 MG tablet follow package directions (Patient not taking: Reported on 07/01/2014) 21 tablet 0   . Oxycodone HCl 10 MG TABS Take 1 tablet (10 mg total) by mouth 4 (four) times daily as needed (pain). (Patient not taking: Reported on 07/01/2014) 120 tablet 0    Scheduled: . albuterol  2.5 mg Nebulization QID  . ALPRAZolam  1 mg Oral TID  . aspirin EC  81 mg Oral Daily  . feeding supplement (ENSURE ENLIVE)  237 mL Oral BID BM  . heparin  5,000 Units Subcutaneous 3 times per day  . levofloxacin (LEVAQUIN) IV  500 mg Intravenous Q24H  . methylPREDNISolone (SOLU-MEDROL) injection  125 mg Intravenous Q6H  . mometasone-formoterol  2 puff Inhalation BID  . sodium chloride  3 mL Intravenous Q12H   Continuous: . sodium chloride 125 mL/hr at 07/03/14 0513    PCH:EKBTCYELY, ondansetron **OR** ondansetron (ZOFRAN) IV, oxyCODONE-acetaminophen **AND** oxyCODONE  Assesment: He has COPD exacerbation and is much improved he had chest pain and that has resolved and he will have outpatient stress testing. He was dehydrated on admission which has resolved Active Problems:   COPD exacerbation   Tobacco abuse   Chest pain at rest   Anxiety state   Chronic back pain greater than 3 months duration   Polycythemia   Dehydration   Malnutrition of moderate degree    Plan: Discharge home with home health services    LOS: 2 days   Herby Amick L 07/03/2014, 8:55 AM

## 2014-07-03 NOTE — Progress Notes (Signed)
Consulting cardiologist: Dr. Satira Sark  Seen for followup: Chest pain  Subjective:    Feels much better this morning, plans to go home.  Objective:   Temp:  [97.7 F (36.5 C)-98 F (36.7 C)] 98 F (36.7 C) (05/26 0511) Pulse Rate:  [66-96] 66 (05/26 0511) Resp:  [20] 20 (05/26 0511) BP: (96-114)/(62-74) 114/74 mmHg (05/26 0511) SpO2:  [90 %-99 %] 93 % (05/26 0749) Last BM Date: 07/01/14  Filed Weights   07/01/14 1311 07/01/14 1819  Weight: 131 lb (59.421 kg) 131 lb (59.421 kg)    Intake/Output Summary (Last 24 hours) at 07/03/14 0856 Last data filed at 07/03/14 0530  Gross per 24 hour  Intake 3661.25 ml  Output    750 ml  Net 2911.25 ml    Telemetry: Sinus rhythm.  Exam:  General: No distress.  Lungs: Decreased breath sounds.  Cardiac: RRR without gallop.  Extremities: No pitting edema.  Lab Results:  Basic Metabolic Panel:  Recent Labs Lab 07/01/14 1549 07/02/14 0628  NA 134* 135  K 3.9 4.4  CL 95* 103  CO2 26 28  GLUCOSE 109* 151*  BUN 12 13  CREATININE 0.58* 0.48*  CALCIUM 9.5 9.0    CBC:  Recent Labs Lab 07/01/14 1549 07/02/14 0628  WBC 18.2* 11.2*  HGB 18.0* 14.8  HCT 53.6* 45.7  MCV 90.7 91.4  PLT 270 296    Cardiac Enzymes:  Recent Labs Lab 07/01/14 1549 07/02/14 0038 07/02/14 0628  TROPONINI <0.03 <0.03 <0.03    Echocardiogram 07/02/2014: Study Conclusions  - Left ventricle: The cavity size was normal. Wall thickness was normal. Systolic function was vigorous. The estimated ejection fraction was in the range of 65% to 70%. Wall motion was normal; there were no regional wall motion abnormalities. Left ventricular diastolic function parameters were normal for the patient&'s age. - Ventricular septum: The contour showed diastolic flattening and systolic flattening. - Mitral valve: There was trivial regurgitation. - Right ventricle: The cavity size was moderately dilated.  Systolic function was mildly reduced. - Right atrium: The atrium was mildly dilated. Central venous pressure (est): 3 mm Hg. - Tricuspid valve: There was trivial regurgitation. - Pulmonary arteries: Systolic pressure could not be accurately estimated. - Pericardium, extracardiac: There was no pericardial effusion.  Impressions:  - Normal LV wall thickness with LVEF 60-73%, normal diastolic function. Trivial mitral regurgitation. Moderate RV enlargement with mildly reduced contraction. There is LV septal flattening suggesting increased right heart pressure and volume, although PASP cannot be accurately estimated.   Medications:   Scheduled Medications: . albuterol  2.5 mg Nebulization QID  . ALPRAZolam  1 mg Oral TID  . aspirin EC  81 mg Oral Daily  . feeding supplement (ENSURE ENLIVE)  237 mL Oral BID BM  . heparin  5,000 Units Subcutaneous 3 times per day  . levofloxacin (LEVAQUIN) IV  500 mg Intravenous Q24H  . methylPREDNISolone (SOLU-MEDROL) injection  125 mg Intravenous Q6H  . mometasone-formoterol  2 puff Inhalation BID  . sodium chloride  3 mL Intravenous Q12H     Infusions: . sodium chloride 125 mL/hr at 07/03/14 0513     PRN Medications:  albuterol, ondansetron **OR** ondansetron (ZOFRAN) IV, oxyCODONE-acetaminophen **AND** oxyCODONE   Assessment:   1. Chest pain with typical and atypical features, no ACS by enzymes. LVEF normal without wall motion abnormalities by echocardiogram.  2. COPD exacerbation, improved symptomatically. He does have evidence of RV dysfunction by echocardiogram as well.   Plan/Discussion:  Patient is being discharged home today per Dr. Luan Pulling. We will arrange an outpatient dobutamine Cardiolite for next week for ischemic evaluation.   Satira Sark, M.D., F.A.C.C.

## 2014-07-03 NOTE — Care Management Note (Signed)
Case Management Note  Patient Details  Name: Cameron Villarreal MRN: [id number] Date of Birth: [birth year]  Expected Discharge Date:                  Expected Discharge Plan:  [place]  In-House Referral:  NA  Discharge planning Services  CM Consult  Post Acute Care Choice:  NA, Home Health Choice offered to:  NA, Patient  DME Arranged:    DME Agency:     HH Arranged:  RN [place] Agency:  [place]  Status of Service:  Completed, signed off  Medicare Important Message Given:    Date Medicare IM Given:    Medicare IM give by:    Date Additional Medicare IM Given:    Additional Medicare Important Message give by:     If discussed at [place] of Stay Meetings, dates discussed:    Additional Comments: Pt being discharged home today with [facility] RN. Pt has chosen [facility] for [facility] services. Romualdo Bolk of [facility] made aware of referral and will obtain pt info from chart. Pt informed [facility] has 48 hours to make first visit. No further CM needs.   Sherald Barge, RN 07/03/2014, 8:59 AM

## 2014-07-03 NOTE — Discharge Summary (Signed)
Physician Discharge Summary  Patient ID: Cameron Villarreal MRN: [id number] DOB/AGE: 1965/10/26 49 y.o. Primary Care Physician:Yeng Frankie L, MD Admit date: 07/01/2014 Discharge date: 07/03/2014    Discharge Diagnoses:   Active Problems:   COPD exacerbation   Tobacco abuse   Chest pain at rest   Anxiety state   Chronic back pain greater than 3 months duration   Polycythemia   Dehydration   Malnutrition of moderate degree  acute on chronic respiratory failure with hypoxia    Medication List    TAKE these medications        ADVAIR DISKUS 250-50 MCG/DOSE Aepb  Generic drug:  Fluticasone-Salmeterol  Inhale 1 puff into the lungs every 12 (twelve) hours.     albuterol (5 MG/ML) 0.5% nebulizer solution  Commonly known as:  PROVENTIL  Take 0.5 mLs (2.5 mg total) by nebulization every 4 (four) hours as needed for wheezing or shortness of breath.     ALPRAZolam 1 MG tablet  Commonly known as:  XANAX  Take 1 mg by mouth 3 (three) times daily.     amoxicillin-clavulanate 875-125 MG per tablet  Commonly known as:  AUGMENTIN  Take 1 tablet by mouth 2 (two) times daily.     aspirin EC 81 MG tablet  Take 81 mg by mouth daily.     levofloxacin 500 MG tablet  Commonly known as:  LEVAQUIN  Take 1 tablet (500 mg total) by mouth daily.     methylPREDNIsolone 4 MG tablet  Commonly known as:  MEDROL DOSPACK  follow package directions     Oxycodone HCl 10 MG Tabs  Take 1 tablet (10 mg total) by mouth 4 (four) times daily as needed (pain).     oxyCODONE-acetaminophen 10-325 MG per tablet  Commonly known as:  PERCOCET  Take 1 tablet by mouth 4 (four) times daily.     OXYGEN  Inhale 2 L into the lungs at bedtime.     predniSONE 10 MG (21) Tbpk tablet  Commonly known as:  STERAPRED UNI-PAK 21 TAB  Take 1 tablet (10 mg total) by mouth daily.        Discharged Condition: Improved    Consults: Cardiology  Significant Diagnostic Studies: Dg Chest 2 View  07/01/2014    CLINICAL DATA:  Chest pain, shortness of breath.  EXAM: CHEST  2 VIEW  COMPARISON:  Jun 22, 2013.  FINDINGS: The heart size and mediastinal contours are within normal limits. No pneumothorax or pleural effusion is noted. Hyperexpansion of the lungs is noted consistent with chronic obstructive pulmonary disease. Stable diffuse interstitial densities are noted throughout both lungs most consistent with emphysematous disease. Stable calcified granuloma is noted in right lung The visualized skeletal structures are unremarkable.  IMPRESSION: Findings consistent with chronic obstructive pulmonary disease. No acute cardiopulmonary abnormality seen.   Electronically Signed   By: Marijo Conception, M.D.   On: 07/01/2014 13:51    Lab Results: Basic Metabolic Panel:  Recent Labs  07/01/14 1549 07/02/14 0628  NA 134* 135  K 3.9 4.4  CL 95* 103  CO2 26 28  GLUCOSE 109* 151*  BUN 12 13  CREATININE 0.58* 0.48*  CALCIUM 9.5 9.0   Liver Function Tests:  Recent Labs  07/02/14 0628  AST 21  ALT 17  ALKPHOS 83  BILITOT 0.6  PROT 6.9  ALBUMIN 3.2*     CBC:  Recent Labs  07/01/14 1549 07/02/14 0628  WBC 18.2* 11.2*  NEUTROABS 15.0*  --   HGB  18.0* 14.8  HCT 53.6* 45.7  MCV 90.7 91.4  PLT 270 296    No results found for this or any previous visit (from the past 240 hour(s)).   Hospital Course: This is a 49 year old who came to the emergency room with increasing shortness of breath cough and congestion. He was found to have acute respiratory failure. He was treated with IV antibiotics and IV steroids and improved. He had some chest discomfort and had cardiology consultation. It was felt that he could have outpatient workup of this. He was much improved at the time of discharge.  Discharge Exam: Blood pressure 114/74, pulse 66, temperature 98 F (36.7 C), temperature source Oral, resp. rate 20, height 5\' 8"  (1.727 m), weight 59.421 kg (131 lb), SpO2 93 %. He is awake and alert. His chest  is much clearer but he is still coughing up sputum  Disposition: Home with home health services      Discharge Instructions    Face-to-face encounter (required for Medicare/Medicaid patients)    Complete by:  As directed   I Sue Fernicola L certify that this patient is under my care and that I, or a nurse practitioner or physician's assistant working with me, had a face-to-face encounter that meets the physician face-to-face encounter requirements with this patient on 07/03/2014. The encounter with the patient was in whole, or in part for the following medical condition(s) which is the primary reason for home health care (List medical condition): COPD exacerbation  The encounter with the patient was in whole, or in part, for the following medical condition, which is the primary reason for home health care:  copd exacerbation  I certify that, based on my findings, the following services are medically necessary home health services:  Nursing  Reason for Medically Necessary Home Health Services:  Skilled Nursing- Change/Decline in Patient Status  My clinical findings support the need for the above services:  Shortness of breath with activity  Further, I certify that my clinical findings support that this patient is homebound due to:  Shortness of Breath with activity     Home Health    Complete by:  As directed   To provide the following care/treatments:  RN           Follow-up Information    Follow up with [facility].   Contact information:   [place] [address] [phone]       Signed: Alonza Bogus   07/03/2014, 9:03 AM

## 2014-07-08 ENCOUNTER — Other Ambulatory Visit: Payer: Self-pay | Admitting: Cardiology

## 2014-07-08 DIAGNOSIS — I209 Angina pectoris, unspecified: Secondary | ICD-10-CM

## 2014-07-09 ENCOUNTER — Ambulatory Visit (HOSPITAL_COMMUNITY): Payer: Medicaid Other

## 2014-07-09 ENCOUNTER — Encounter (HOSPITAL_COMMUNITY): Payer: Medicaid Other

## 2014-07-09 ENCOUNTER — Inpatient Hospital Stay (HOSPITAL_COMMUNITY): Admit: 2014-07-09 | Payer: Medicaid Other

## 2014-07-15 ENCOUNTER — Encounter (HOSPITAL_COMMUNITY)
Admission: RE | Admit: 2014-07-15 | Discharge: 2014-07-15 | Disposition: A | Discharge status: Home / Self Care | Payer: Medicaid Other | Source: Ambulatory Visit | Attending: Cardiology | Admitting: Cardiology

## 2014-07-15 ENCOUNTER — Encounter (HOSPITAL_COMMUNITY): Payer: Self-pay

## 2014-07-15 ENCOUNTER — Encounter (HOSPITAL_COMMUNITY): Admission: RE | Admit: 2014-07-15 | Payer: Medicaid Other | Source: Ambulatory Visit

## 2014-07-15 DIAGNOSIS — I209 Angina pectoris, unspecified: Secondary | ICD-10-CM

## 2014-07-15 LAB — NM MYOCAR MULTI W/SPECT W/WALL MOTION / EF
CHL CUP MPHR: 170 {beats}/min
CHL CUP NUCLEAR SSS: 3
CSEPEW: 0 METS
CSEPHR: 94 %
Exercise duration (min): 9 min
Exercise duration (sec): 50 s
LV dias vol: 104 mL
LV sys vol: 42 mL
Peak HR: 160 {beats}/min
RATE: 0
Rest HR: 93 {beats}/min
SDS: 0
SRS: 3
TID: 1.15

## 2014-07-15 MED ORDER — DOBUTAMINE IN D5W 4-5 MG/ML-% IV SOLN
INTRAVENOUS | Status: AC
  Filled 2014-07-15: qty 250

## 2014-07-15 MED ORDER — SODIUM CHLORIDE 0.9 % IJ SOLN: 10.0000 mL | INTRAMUSCULAR | Status: DC | PRN

## 2014-07-15 MED ORDER — TECHNETIUM TC 99M SESTAMIBI - CARDIOLITE
30.0000 | Freq: Once | INTRAVENOUS | Status: AC | PRN
Start: 2014-07-15 — End: 2014-07-15
  Administered 2014-07-15: 13:00:00 32 via INTRAVENOUS

## 2014-07-15 MED ORDER — SODIUM CHLORIDE 0.9 % IJ SOLN
INTRAMUSCULAR | Status: AC
  Administered 2014-07-15: 10 mL via INTRAVENOUS
  Filled 2014-07-15: qty 3

## 2014-07-15 MED ORDER — DOBUTAMINE IN D5W 4-5 MG/ML-% IV SOLN
2.5000 ug/kg/min | INTRAVENOUS | Status: DC
  Administered 2014-07-15: 40 ug/kg/min via INTRAVENOUS
  Administered 2014-07-15: 10 ug/kg/min via INTRAVENOUS
  Administered 2014-07-15: 30 ug/kg/min via INTRAVENOUS
  Administered 2014-07-15: 20 ug/kg/min via INTRAVENOUS
  Filled 2014-07-15: qty 250

## 2014-07-15 MED ORDER — TECHNETIUM TC 99M SESTAMIBI GENERIC - CARDIOLITE
10.0000 | Freq: Once | INTRAVENOUS | Status: AC | PRN
  Administered 2014-07-15: 11 via INTRAVENOUS

## 2014-08-23 ENCOUNTER — Observation Stay (HOSPITAL_COMMUNITY)
Admission: EM | Admit: 2014-08-23 | Discharge: 2014-08-24 | Disposition: A | Discharge status: Home / Self Care | Payer: Medicaid Other | Attending: Pulmonary Disease | Admitting: Pulmonary Disease

## 2014-08-23 ENCOUNTER — Encounter (HOSPITAL_COMMUNITY): Payer: Self-pay | Admitting: *Deleted

## 2014-08-23 ENCOUNTER — Emergency Department (HOSPITAL_COMMUNITY): Payer: Medicaid Other

## 2014-08-23 DIAGNOSIS — Z7982 Long term (current) use of aspirin: Secondary | ICD-10-CM | POA: Diagnosis not present

## 2014-08-23 DIAGNOSIS — Z9889 Other specified postprocedural states: Secondary | ICD-10-CM | POA: Diagnosis not present

## 2014-08-23 DIAGNOSIS — E780 Pure hypercholesterolemia: Secondary | ICD-10-CM | POA: Insufficient documentation

## 2014-08-23 DIAGNOSIS — K219 Gastro-esophageal reflux disease without esophagitis: Secondary | ICD-10-CM | POA: Diagnosis not present

## 2014-08-23 DIAGNOSIS — Z79899 Other long term (current) drug therapy: Secondary | ICD-10-CM | POA: Insufficient documentation

## 2014-08-23 DIAGNOSIS — R000 Tachycardia, unspecified: Secondary | ICD-10-CM | POA: Diagnosis not present

## 2014-08-23 DIAGNOSIS — J441 Chronic obstructive pulmonary disease with (acute) exacerbation: Principal | ICD-10-CM | POA: Insufficient documentation

## 2014-08-23 DIAGNOSIS — Z7951 Long term (current) use of inhaled steroids: Secondary | ICD-10-CM | POA: Diagnosis not present

## 2014-08-23 DIAGNOSIS — Z720 Tobacco use: Secondary | ICD-10-CM | POA: Diagnosis present

## 2014-08-23 DIAGNOSIS — R079 Chest pain, unspecified: Secondary | ICD-10-CM | POA: Diagnosis present

## 2014-08-23 HISTORY — DX: Gastro-esophageal reflux disease without esophagitis: K21.9

## 2014-08-23 LAB — CBC
HCT: 50.3 % (ref 39.0–52.0)
Hemoglobin: 16.4 g/dL (ref 13.0–17.0)
MCH: 30.4 pg (ref 26.0–34.0)
MCHC: 32.6 g/dL (ref 30.0–36.0)
MCV: 93.3 fL (ref 78.0–100.0)
PLATELETS: 356 10*3/uL (ref 150–400)
RBC: 5.39 MIL/uL (ref 4.22–5.81)
RDW: 14.6 % (ref 11.5–15.5)
WBC: 20.1 10*3/uL — AB (ref 4.0–10.5)

## 2014-08-23 LAB — BASIC METABOLIC PANEL
Anion gap: 10 (ref 5–15)
BUN: 11 mg/dL (ref 6–20)
CHLORIDE: 94 mmol/L — AB (ref 101–111)
CO2: 31 mmol/L (ref 22–32)
Calcium: 8.7 mg/dL — ABNORMAL LOW (ref 8.9–10.3)
Creatinine, Ser: 0.7 mg/dL (ref 0.61–1.24)
GFR calc non Af Amer: >60 mL/min (ref >60)
Glucose, Bld: 77 mg/dL (ref 65–99)
POTASSIUM: 4 mmol/L (ref 3.5–5.1)
SODIUM: 135 mmol/L (ref 135–145)

## 2014-08-23 LAB — TROPONIN I

## 2014-08-23 MED ORDER — IPRATROPIUM BROMIDE 0.02 % IN SOLN
RESPIRATORY_TRACT | Status: AC
  Administered 2014-08-23: 0.5 mg
  Filled 2014-08-23: qty 2.5

## 2014-08-23 MED ORDER — IPRATROPIUM-ALBUTEROL 0.5-2.5 (3) MG/3ML IN SOLN
3.0000 mL | Freq: Once | RESPIRATORY_TRACT | Status: AC
  Administered 2014-08-23: 3 mL via RESPIRATORY_TRACT
  Filled 2014-08-23: qty 3

## 2014-08-23 MED ORDER — ALPRAZOLAM 0.5 MG PO TABS
1.0000 mg | ORAL_TABLET | Freq: Once | ORAL | Status: AC
  Administered 2014-08-23: 1 mg via ORAL
  Filled 2014-08-23: qty 2

## 2014-08-23 MED ORDER — ALBUTEROL SULFATE (2.5 MG/3ML) 0.083% IN NEBU
2.5000 mg | INHALATION_SOLUTION | Freq: Four times a day (QID) | RESPIRATORY_TRACT | Status: DC
  Administered 2014-08-24 (×2): 2.5 mg via RESPIRATORY_TRACT
  Filled 2014-08-23 (×2): qty 3

## 2014-08-23 MED ORDER — MAGNESIUM SULFATE 2 GM/50ML IV SOLN
2.0000 g | Freq: Once | INTRAVENOUS | Status: AC
  Administered 2014-08-23: 2 g via INTRAVENOUS
  Filled 2014-08-23: qty 50

## 2014-08-23 MED ORDER — ALBUTEROL SULFATE (2.5 MG/3ML) 0.083% IN NEBU: 2.5000 mg | INHALATION_SOLUTION | Freq: Four times a day (QID) | RESPIRATORY_TRACT | Status: DC | PRN

## 2014-08-23 MED ORDER — NICOTINE 21 MG/24HR TD PT24
21.0000 mg | MEDICATED_PATCH | Freq: Every day | TRANSDERMAL | Status: DC
  Administered 2014-08-24: 21 mg via TRANSDERMAL
  Filled 2014-08-23: qty 1

## 2014-08-23 MED ORDER — METHYLPREDNISOLONE SODIUM SUCC 125 MG IJ SOLR
80.0000 mg | Freq: Three times a day (TID) | INTRAMUSCULAR | Status: DC
Start: 2014-08-23 — End: 2014-08-24

## 2014-08-23 MED ORDER — ALBUTEROL SULFATE (2.5 MG/3ML) 0.083% IN NEBU
INHALATION_SOLUTION | RESPIRATORY_TRACT | Status: AC
  Administered 2014-08-23: 5 mg
  Filled 2014-08-23: qty 6

## 2014-08-23 MED ORDER — TIOTROPIUM BROMIDE MONOHYDRATE 18 MCG IN CAPS
18.0000 ug | ORAL_CAPSULE | Freq: Every day | RESPIRATORY_TRACT | Status: DC
  Administered 2014-08-24: 18 ug via RESPIRATORY_TRACT
  Filled 2014-08-23: qty 5

## 2014-08-23 MED ORDER — METHYLPREDNISOLONE SODIUM SUCC 125 MG IJ SOLR
125.0000 mg | Freq: Once | INTRAMUSCULAR | Status: AC
  Administered 2014-08-23: 125 mg via INTRAVENOUS
  Filled 2014-08-23: qty 2

## 2014-08-23 NOTE — ED Notes (Signed)
Pt reporting left sided CP and SOB.  States that it began "earlier this morning".  Pt denies any nausea or vomiting.

## 2014-08-23 NOTE — H&P (Addendum)
Cameron Villarreal is an 49 y.o. male.    Pcp:  Dr. Luan Pulling  Chief Complaint: dyspnea HPI: 49 yo male with Copd on home o2, apparently c/o dyspnea starting at 3 pm this afternoon.  + cough with yellow sputum.  Pt denies fever, chills, cp, palp, n/v, diarrhea, brbpr, black stool.  Pt was brought to ED and found to have Copd exacerbation.    Past Medical History  Diagnosis Date  . COPD (chronic obstructive pulmonary disease)     Severe centrilobular emphysema, on home o2  . Hypercholesterolemia   . S/P endoscopy March 2012    Reflux esophagitis, no ulcerations  . S/P colonoscopy March 2012    Normal  . Pulmonary nodule 07/04/2011    Past Surgical History  Procedure Laterality Date  . Cholecystectomy    . Appendectomy    . Right inguinal hernia repair    . Hernia repair    . Knee surgery      Family History  Problem Relation Age of Onset  . Cirrhosis Father     Deceased, ETOH cirrhosis  . Stomach cancer      Aunt   Social History:  reports that he has been smoking Cigarettes.  He has a 15 pack-year smoking history. He has never used smokeless tobacco. He reports that he drinks about 0.6 oz of alcohol per week. He reports that he does not use illicit drugs.  Allergies:  Allergies  Allergen Reactions  . Codeine Itching and Nausea Only     (Not in a hospital admission)  Results for orders placed or performed during the hospital encounter of 08/23/14 (from the past 48 hour(s))  Basic metabolic panel     Status: Abnormal   Collection Time: 08/23/14  9:40 PM  Result Value Ref Range   Sodium 135 135 - 145 mmol/L   Potassium 4.0 3.5 - 5.1 mmol/L   Chloride 94 (L) 101 - 111 mmol/L   CO2 31 22 - 32 mmol/L   Glucose, Bld 77 65 - 99 mg/dL   BUN 11 6 - 20 mg/dL   Creatinine, Ser 0.70 0.61 - 1.24 mg/dL   Calcium 8.7 (L) 8.9 - 10.3 mg/dL   GFR calc non Af Amer >60 >60 mL/min   GFR calc Af Amer >60 >60 mL/min    Comment: (NOTE) The eGFR has been calculated using the CKD EPI  equation. This calculation has not been validated in all clinical situations. eGFR's persistently <60 mL/min signify possible Chronic Kidney Disease.    Anion gap 10 5 - 15  CBC     Status: Abnormal   Collection Time: 08/23/14  9:40 PM  Result Value Ref Range   WBC 20.1 (H) 4.0 - 10.5 K/uL   RBC 5.39 4.22 - 5.81 MIL/uL   Hemoglobin 16.4 13.0 - 17.0 g/dL   HCT 50.3 39.0 - 52.0 %   MCV 93.3 78.0 - 100.0 fL   MCH 30.4 26.0 - 34.0 pg   MCHC 32.6 30.0 - 36.0 g/dL   RDW 14.6 11.5 - 15.5 %   Platelets 356 150 - 400 K/uL  Troponin I     Status: None   Collection Time: 08/23/14  9:40 PM  Result Value Ref Range   Troponin I <0.03 <0.031 ng/mL    Comment:        NO INDICATION OF MYOCARDIAL INJURY.    Dg Chest 2 View  08/23/2014   CLINICAL DATA:  49 year old male with chest pain  EXAM: CHEST  2 VIEW  COMPARISON:  Chest radiograph dated 07/01/2014  FINDINGS: Two views of the chest demonstrate emphysematous changes and hyperinflation of the lung. No focal consolidation, pleural effusion, or pneumothorax. The cardiomediastinal silhouette is within normal limits. A 2 mm nodular density noted in the right mid lung field similar to prior study which may represent a vessel on end and or scarring.  The osseous structures are grossly unremarkable.  IMPRESSION: No acute cardiopulmonary process.  Emphysema.   Electronically Signed   By: Anner Crete M.D.   On: 08/23/2014 22:50    Review of Systems  Constitutional: Negative.   HENT: Negative.   Eyes: Negative.   Respiratory: Positive for cough, sputum production, shortness of breath and wheezing. Negative for hemoptysis.   Cardiovascular: Negative.   Gastrointestinal: Negative.   Genitourinary: Negative.   Musculoskeletal: Negative.   Skin: Negative.   Neurological: Negative.   Endo/Heme/Allergies: Negative.     Blood pressure 113/79, pulse 101, temperature 99.3 F (37.4 C), temperature source Oral, resp. rate 15, height _0  (1.651 m),  weight 58.968 kg (130 lb), SpO2 97 %. Physical Exam  Constitutional: He is oriented to person, place, and time. He appears well-developed and well-nourished.  HENT:  Head: Normocephalic and atraumatic.  Mouth/Throat: No oropharyngeal exudate.  Eyes: Conjunctivae and EOM are normal. Pupils are equal, round, and reactive to light. No scleral icterus.  Neck: Normal range of motion. Neck supple. No JVD present. No tracheal deviation present. No thyromegaly present.  Cardiovascular: Normal rate and regular rhythm.  Exam reveals no gallop and no friction rub.   No murmur heard. Respiratory: Effort normal. No respiratory distress. He has wheezes. He has no rales. He exhibits no tenderness.  GI: Soft. Bowel sounds are normal. He exhibits no distension. There is no tenderness. There is no rebound and no guarding.  Musculoskeletal: Normal range of motion. He exhibits no edema or tenderness.  Lymphadenopathy:    He has no cervical adenopathy.  Neurological: He is alert and oriented to person, place, and time. He has normal reflexes. He displays normal reflexes. No cranial nerve deficit. He exhibits normal muscle tone. Coordination normal.  Skin: Skin is warm and dry. No rash noted. No erythema. No pallor.  Psychiatric: He has a normal mood and affect. His behavior is normal. Judgment and thought content normal.     Assessment/Plan Dyspnea secondary to Copd exaerbation Solumedrol 59m iv q8h levaquin 5074miv qday Sputum gram stain, culture spriiva 1puff qday Albuterol neb 1 neb po q6h and q6h prn  Leukocytosis seconadry to bronchitis  Tobacco use: Pt counselled for 5 min on smoking cessation  Nicotine patch  DVT prophylaxis:  Scd, lovenox  KIJani Gravel/16/2016, 11:27 PM

## 2014-08-23 NOTE — ED Provider Notes (Addendum)
CSN: [id number]     Arrival date & time 08/23/14  2121 History  This chart was scribed for Cameron Christen, MD by Julien Nordmann, ED Scribe. This patient was seen in room [facility]10/[facility]10 and the patient's care was started at 9:37 PM.    Chief Complaint  Patient presents with  . Chest Pain      The history is provided by the patient. No language interpreter was used.   HPI Comments: Level V caveat for urgent need for intervention. Cameron Villarreal is a 49 y.o. male who has a hx of COPD presents to the Emergency Department complaining of gradual worsening shortness of breath onset 6 hours ago. He has associated left sided chest pain.  Pt notes using a breathing treatment while he was home to help alleviate his shortness of breath with no relief. He reports being seen for same symptoms in May. Pt is a smoker.    Past Medical History  Diagnosis Date  . COPD (chronic obstructive pulmonary disease)     Severe centrilobular emphysema  . Hypercholesterolemia   . S/P endoscopy March 2012    Reflux esophagitis, no ulcerations  . S/P colonoscopy March 2012    Normal  . Pulmonary nodule 07/04/2011   Past Surgical History  Procedure Laterality Date  . Cholecystectomy    . Appendectomy    . Right inguinal hernia repair    . Hernia repair    . Knee surgery     Family History  Problem Relation Age of Onset  . Cirrhosis Father     Deceased, ETOH cirrhosis  . Stomach cancer      Aunt   History  Substance Use Topics  . Smoking status: Current Every Day Smoker -- 0.50 packs/day for 30 years    Types: Cigarettes  . Smokeless tobacco: Never Used  . Alcohol Use: 0.6 oz/week    1 Standard drinks or equivalent per week     Comment: Occasional    Review of Systems  Unable to perform ROS: Acuity of condition    A complete 10 system review of systems was obtained and all systems are negative except as noted in the HPI and PMH.    Allergies  Codeine  Home Medications   Prior to Admission  medications   Medication Sig Start Date End Date Taking? Authorizing Provider  albuterol (PROVENTIL) (5 MG/ML) 0.5% nebulizer solution Take 0.5 mLs (2.5 mg total) by nebulization every 4 (four) hours as needed for wheezing or shortness of breath. 04/18/12  Yes Noemi Chapel, MD  ALPRAZolam Duanne Moron) 1 MG tablet Take 1 mg by mouth 3 (three) times daily.   Yes Historical Provider, MD  aspirin EC 81 MG tablet Take 81 mg by mouth daily.   Yes Historical Provider, MD  Fluticasone-Salmeterol (ADVAIR DISKUS) 250-50 MCG/DOSE AEPB Inhale 1 puff into the lungs every 12 (twelve) hours.     Yes Historical Provider, MD  oxyCODONE-acetaminophen (PERCOCET) 10-325 MG per tablet Take 1 tablet by mouth 4 (four) times daily.   Yes Historical Provider, MD  amoxicillin-clavulanate (AUGMENTIN) 875-125 MG per tablet Take 1 tablet by mouth 2 (two) times daily. Patient not taking: Reported on 07/01/2014 06/25/13   Sinda Du, MD  levofloxacin (LEVAQUIN) 500 MG tablet Take 1 tablet (500 mg total) by mouth daily. 07/03/14   Sinda Du, MD  methylPREDNIsolone (MEDROL DOSPACK) 4 MG tablet follow package directions Patient not taking: Reported on 07/01/2014 06/25/13   Sinda Du, MD  Oxycodone HCl 10 MG TABS Take  1 tablet (10 mg total) by mouth 4 (four) times daily as needed (pain). Patient not taking: Reported on 07/01/2014 06/25/13   Sinda Du, MD  predniSONE (STERAPRED UNI-PAK 21 TAB) 10 MG (21) TBPK tablet Take 1 tablet (10 mg total) by mouth daily. 07/03/14   Sinda Du, MD   Triage vitals: BP 128/94 mmHg  Pulse 107  Temp(Src) 99.3 F (37.4 C) (Oral)  Resp 22  Ht 5\' 5"  (1.651 m)  Wt 130 lb (58.968 kg)  BMI 21.63 kg/m2  SpO2 80% Physical Exam  Constitutional: He is oriented to person, place, and time. He appears well-developed and well-nourished.  HENT:  Head: Normocephalic and atraumatic.  Eyes: Conjunctivae and EOM are normal. Pupils are equal, round, and reactive to light.  Neck: Normal range of  motion. Neck supple.  Cardiovascular: Regular rhythm.  Tachycardia present.   Pulmonary/Chest: Effort normal. He has wheezes.  Bilateral wheezes Dyspneic   Abdominal: Soft. Bowel sounds are normal.  Musculoskeletal: Normal range of motion.  Neurological: He is alert and oriented to person, place, and time.  Skin: Skin is warm and dry.  Psychiatric: He has a normal mood and affect. His behavior is normal.  Nursing note and vitals reviewed.   ED Course  Procedures  DIAGNOSTIC STUDIES: Oxygen Saturation is 80% on RA, low by my interpretation.  COORDINATION OF CARE:  9:39 PM Discussed treatment plan which includes breathing treatment and x-ray with pt at bedside and pt agreed to plan.  Labs Review Labs Reviewed  BASIC METABOLIC PANEL - Abnormal; Notable for the following:    Chloride 94 (*)    Calcium 8.7 (*)    All other components within normal limits  CBC - Abnormal; Notable for the following:    WBC 20.1 (*)    All other components within normal limits  TROPONIN I    Imaging Review Dg Chest 2 View  08/23/2014   CLINICAL DATA:  49 year old male with chest pain  EXAM: CHEST  2 VIEW  COMPARISON:  Chest radiograph dated 07/01/2014  FINDINGS: Two views of the chest demonstrate emphysematous changes and hyperinflation of the lung. No focal consolidation, pleural effusion, or pneumothorax. The cardiomediastinal silhouette is within normal limits. A 2 mm nodular density noted in the right mid lung field similar to prior study which may represent a vessel on end and or scarring.  The osseous structures are grossly unremarkable.  IMPRESSION: No acute cardiopulmonary process.  Emphysema.   Electronically Signed   By: Anner Crete M.D.   On: 08/23/2014 22:50     EKG Interpretation   Date/Time:  Saturday August 23 2014 21:28:16 EDT Ventricular Rate:  108 PR Interval:  144 QRS Duration: 86 QT Interval:  347 QTC Calculation: 465 R Axis:   94 Text Interpretation:  Sinus tachycardia  Borderline right axis deviation  Nonspecific T abnrm, anterolateral leads Confirmed by Lacinda Axon  MD, Evia Goldsmith  ([id number]) on 08/23/2014 9:31:58 PM      MDM   Final diagnoses:  COPD exacerbation   Patient has severe COPD. He continues to smoke. He was tachypneic and dyspneic on initial presentation. Chest x-ray shows no acute changes. IV steroids, Albuterol/Atrovent breathing treatment. Admit.  I personally performed the services described in this documentation, which was scribed in my presence. The recorded information has been reviewed and is accurate.   Cameron Christen, MD 08/23/14 [place], MD 08/23/14 [phone]

## 2014-08-24 ENCOUNTER — Encounter (HOSPITAL_COMMUNITY): Payer: Self-pay | Admitting: *Deleted

## 2014-08-24 LAB — CBC
HCT: 47.3 % (ref 39.0–52.0)
HEMOGLOBIN: 15.1 g/dL (ref 13.0–17.0)
MCH: 29.6 pg (ref 26.0–34.0)
MCHC: 31.9 g/dL (ref 30.0–36.0)
MCV: 92.7 fL (ref 78.0–100.0)
Platelets: 349 10*3/uL (ref 150–400)
RBC: 5.1 MIL/uL (ref 4.22–5.81)
RDW: 14.5 % (ref 11.5–15.5)
WBC: 10.9 10*3/uL — AB (ref 4.0–10.5)

## 2014-08-24 LAB — COMPREHENSIVE METABOLIC PANEL
ALK PHOS: 85 U/L (ref 38–126)
ALT: 14 U/L — AB (ref 17–63)
AST: 19 U/L (ref 15–41)
Albumin: 3.5 g/dL (ref 3.5–5.0)
Anion gap: 6 (ref 5–15)
BUN: 10 mg/dL (ref 6–20)
CALCIUM: 8.8 mg/dL — AB (ref 8.9–10.3)
CHLORIDE: 99 mmol/L — AB (ref 101–111)
CO2: 30 mmol/L (ref 22–32)
Creatinine, Ser: 0.54 mg/dL — ABNORMAL LOW (ref 0.61–1.24)
GFR calc Af Amer: >60 mL/min (ref >60)
GLUCOSE: 150 mg/dL — AB (ref 65–99)
Potassium: 4.6 mmol/L (ref 3.5–5.1)
SODIUM: 135 mmol/L (ref 135–145)
Total Bilirubin: 0.6 mg/dL (ref 0.3–1.2)
Total Protein: 7 g/dL (ref 6.5–8.1)

## 2014-08-24 MED ORDER — ACETAMINOPHEN 325 MG PO TABS: 650.0000 mg | ORAL_TABLET | Freq: Four times a day (QID) | ORAL | Status: DC | PRN

## 2014-08-24 MED ORDER — VARENICLINE TARTRATE 1 MG PO TABS: 1.0000 mg | ORAL_TABLET | Freq: Two times a day (BID) | ORAL | Status: DC

## 2014-08-24 MED ORDER — OXYCODONE-ACETAMINOPHEN 5-325 MG PO TABS: 2.0000 | ORAL_TABLET | Freq: Four times a day (QID) | ORAL | Status: DC

## 2014-08-24 MED ORDER — ALBUTEROL SULFATE (2.5 MG/3ML) 0.083% IN NEBU: 2.5000 mg | INHALATION_SOLUTION | RESPIRATORY_TRACT | Status: DC | PRN

## 2014-08-24 MED ORDER — AMOXICILLIN-POT CLAVULANATE 875-125 MG PO TABS: 1.0000 | ORAL_TABLET | Freq: Two times a day (BID) | ORAL | Status: DC

## 2014-08-24 MED ORDER — ACETAMINOPHEN 650 MG RE SUPP
650.0000 mg | Freq: Four times a day (QID) | RECTAL | Status: DC | PRN
Start: 2014-08-24 — End: 2014-08-24

## 2014-08-24 MED ORDER — METHYLPREDNISOLONE SODIUM SUCC 125 MG IJ SOLR: 80.0000 mg | Freq: Three times a day (TID) | INTRAMUSCULAR | Status: DC

## 2014-08-24 MED ORDER — OXYCODONE-ACETAMINOPHEN 5-325 MG PO TABS
1.0000 | ORAL_TABLET | Freq: Four times a day (QID) | ORAL | Status: DC
  Administered 2014-08-24 (×2): 2 via ORAL
  Filled 2014-08-24 (×2): qty 2

## 2014-08-24 MED ORDER — ALPRAZOLAM 1 MG PO TABS
1.0000 mg | ORAL_TABLET | Freq: Three times a day (TID) | ORAL | Status: DC
  Administered 2014-08-24: 1 mg via ORAL
  Filled 2014-08-24: qty 1

## 2014-08-24 MED ORDER — PREDNISONE 10 MG PO TABS: 10.0000 mg | ORAL_TABLET | Freq: Every day | ORAL | Status: DC

## 2014-08-24 MED ORDER — VARENICLINE TARTRATE 0.5 MG X 11 & 1 MG X 42 PO MISC: ORAL | Status: DC

## 2014-08-24 MED ORDER — ENOXAPARIN SODIUM 40 MG/0.4ML ~~LOC~~ SOLN
40.0000 mg | SUBCUTANEOUS | Status: DC
  Administered 2014-08-24: 40 mg via SUBCUTANEOUS
  Filled 2014-08-24: qty 0.4

## 2014-08-24 MED ORDER — SODIUM CHLORIDE 0.9 % IJ SOLN
3.0000 mL | Freq: Two times a day (BID) | INTRAMUSCULAR | Status: DC
  Administered 2014-08-24: 3 mL via INTRAVENOUS

## 2014-08-24 MED ORDER — CETYLPYRIDINIUM CHLORIDE 0.05 % MT LIQD
7.0000 mL | Freq: Two times a day (BID) | OROMUCOSAL | Status: DC
  Administered 2014-08-24: 7 mL via OROMUCOSAL

## 2014-08-24 MED ORDER — MOMETASONE FURO-FORMOTEROL FUM 100-5 MCG/ACT IN AERO
2.0000 | INHALATION_SPRAY | Freq: Two times a day (BID) | RESPIRATORY_TRACT | Status: DC
  Administered 2014-08-24: 2 via RESPIRATORY_TRACT
  Filled 2014-08-24: qty 8.8

## 2014-08-24 MED ORDER — ASPIRIN EC 81 MG PO TBEC
81.0000 mg | DELAYED_RELEASE_TABLET | Freq: Every day | ORAL | Status: DC
  Administered 2014-08-24: 81 mg via ORAL
  Filled 2014-08-24: qty 1

## 2014-08-24 MED ORDER — SODIUM CHLORIDE 0.9 % IV SOLN
INTRAVENOUS | Status: AC
  Administered 2014-08-24: 01:00:00 via INTRAVENOUS

## 2014-08-24 NOTE — Discharge Summary (Signed)
Physician Discharge Summary  Patient ID: Cameron Villarreal MRN: [id number] DOB/AGE: [birth year] 49 y.o. Primary Care Physician:Annalea Alguire L, MD Admit date: 08/23/2014 Discharge date: 08/24/2014    Discharge Diagnoses:   Active Problems:   COPD exacerbation   Tobacco abuse  chronic anxiety Chronic low back pain    Medication List    STOP taking these medications        levofloxacin 500 MG tablet  Commonly known as:  LEVAQUIN     methylPREDNIsolone 4 MG tablet  Commonly known as:  MEDROL DOSPACK     oxyCODONE-acetaminophen 10-325 MG per tablet  Commonly known as:  PERCOCET     predniSONE 10 MG (21) Tbpk tablet  Commonly known as:  STERAPRED UNI-PAK 21 TAB  Replaced by:  predniSONE 10 MG tablet      TAKE these medications        ADVAIR DISKUS 250-50 MCG/DOSE Aepb  Generic drug:  Fluticasone-Salmeterol  Inhale 1 puff into the lungs every 12 (twelve) hours.     albuterol (5 MG/ML) 0.5% nebulizer solution  Commonly known as:  PROVENTIL  Take 0.5 mLs (2.5 mg total) by nebulization every 4 (four) hours as needed for wheezing or shortness of breath.     ALPRAZolam 1 MG tablet  Commonly known as:  XANAX  Take 1 mg by mouth 3 (three) times daily.     amoxicillin-clavulanate 875-125 MG per tablet  Commonly known as:  AUGMENTIN  Take 1 tablet by mouth 2 (two) times daily.     aspirin EC 81 MG tablet  Take 81 mg by mouth daily.     Oxycodone HCl 10 MG Tabs  Take 1 tablet (10 mg total) by mouth 4 (four) times daily as needed (pain).     predniSONE 10 MG tablet  Commonly known as:  DELTASONE  Take 1 tablet (10 mg total) by mouth daily with breakfast. 4x3 d,3x3d,2x3d,1x3d.     varenicline 1 MG tablet  Commonly known as:  CHANTIX CONTINUING MONTH PAK  Take 1 tablet (1 mg total) by mouth 2 (two) times daily.     varenicline 0.5 MG X 11 & 1 MG X 42 tablet  Commonly known as:  CHANTIX STARTING MONTH PAK  Take one 0.5 mg tablet by mouth once daily for 3 days, then  increase to one 0.5 mg tablet twice daily for 4 days, then increase to one 1 mg tablet twice daily.        Discharged Condition: Improved    Consults: None  Significant Diagnostic Studies: Dg Chest 2 View  08/23/2014   CLINICAL DATA:  49 year old male with chest pain  EXAM: CHEST  2 VIEW  COMPARISON:  Chest radiograph dated 07/01/2014  FINDINGS: Two views of the chest demonstrate emphysematous changes and hyperinflation of the lung. No focal consolidation, pleural effusion, or pneumothorax. The cardiomediastinal silhouette is within normal limits. A 2 mm nodular density noted in the right mid lung field similar to prior study which may represent a vessel on end and or scarring.  The osseous structures are grossly unremarkable.  IMPRESSION: No acute cardiopulmonary process.  Emphysema.   Electronically Signed   By: Anner Crete M.D.   On: 08/23/2014 22:50    Lab Results: Basic Metabolic Panel:  Recent Labs  08/23/14 2140 08/24/14 0626  NA 135 135  K 4.0 4.6  CL 94* 99*  CO2 31 30  GLUCOSE 77 150*  BUN 11 10  CREATININE 0.70 0.54*  CALCIUM 8.7* 8.8*  Liver Function Tests:  Recent Labs  08/24/14 0626  AST 19  ALT 14*  ALKPHOS 85  BILITOT 0.6  PROT 7.0  ALBUMIN 3.5     CBC:  Recent Labs  08/23/14 2140 08/24/14 0626  WBC 20.1* 10.9*  HGB 16.4 15.1  HCT 50.3 47.3  MCV 93.3 92.7  PLT 356 349    No results found for this or any previous visit (from the past 240 hour(s)).   Hospital Course: This is a 49 year old with long known history of severe COPD with ongoing tobacco abuse who came to the emergency department because of increasing shortness of breath. He was treated in the emergency department but could not get back to baseline so he was brought in for observation. He was given IV steroids IV antibiotics and improved. By the next morning he was back at baseline and wanted to go home.  Discharge Exam: Blood pressure 109/73, pulse 78, temperature 97.5 F  (36.4 C), temperature source Oral, resp. rate 19, height 5\' 8"  (1.727 m), weight 57.2 kg (126 lb 1.7 oz), SpO2 94 %. He is awake and alert. His chest is clear. He has decreased breath sounds.  Disposition: Home we discussed home health services but he does not want to do that now. He has oxygen nebulizer antibiotic and prednisone available at home.      Discharge Instructions    Discharge patient    Complete by:  As directed              Signed: Lorretta Kerce L   08/24/2014, 9:40 AM

## 2014-08-24 NOTE — Progress Notes (Signed)
He feels much better and says he wants to go home. His cough is less he's much less short of breath and he is much more comfortable.  He is awake and alert. He looks back to his baseline. His temperature is 97.5, pulse 78, respirations 19, blood pressure 109/73 and O2 sat 94%. His chest is clear. His heart is regular. He does look comfortable.  My assessment disease ready for discharge   Plan is for discharge home

## 2014-08-24 NOTE — Progress Notes (Signed)
NURSING PROGRESS NOTE  Cameron Villarreal [id number] Discharge Data: 08/24/2014 11:37 AM Attending Provider: No att. providers found UPJ:[id number],OPFYTW L, MD   [name] to be D/C'd Home per MD order.    All IV's discontinued and monitored for bleeding.  All belongings returned to patient for patient to take home.  Cardiac monitor discontinued.  AVS summary and prescriptions reviewed with patient.  Patient left floor via wheelchair, escorted by NT.  Last Documented Vital Signs:  Blood pressure 109/73, pulse 78, temperature 97.5 F (36.4 C), temperature source Oral, resp. rate 19, height 5\' 8"  (1.727 m), weight 57.2 kg (126 lb 1.7 oz), SpO2 94 %.  [name] D

## 2014-08-27 LAB — CULTURE, RESPIRATORY W GRAM STAIN: Gram Stain: NONE SEEN

## 2014-08-27 LAB — CULTURE, RESPIRATORY: CULTURE: NORMAL

## 2014-12-25 ENCOUNTER — Emergency Department (HOSPITAL_COMMUNITY): Payer: Medicaid Other

## 2014-12-25 ENCOUNTER — Inpatient Hospital Stay (HOSPITAL_COMMUNITY)
Admission: EM | Admit: 2014-12-25 | Discharge: 2014-12-27 | DRG: 193 | Disposition: A | Discharge status: Home / Self Care | Payer: Medicaid Other | Attending: Pulmonary Disease | Admitting: Pulmonary Disease

## 2014-12-25 ENCOUNTER — Encounter (HOSPITAL_COMMUNITY): Payer: Self-pay

## 2014-12-25 DIAGNOSIS — J9601 Acute respiratory failure with hypoxia: Secondary | ICD-10-CM

## 2014-12-25 DIAGNOSIS — J9621 Acute and chronic respiratory failure with hypoxia: Secondary | ICD-10-CM | POA: Diagnosis present

## 2014-12-25 DIAGNOSIS — E7800 Pure hypercholesterolemia, unspecified: Secondary | ICD-10-CM | POA: Diagnosis present

## 2014-12-25 DIAGNOSIS — F419 Anxiety disorder, unspecified: Secondary | ICD-10-CM | POA: Diagnosis present

## 2014-12-25 DIAGNOSIS — F17210 Nicotine dependence, cigarettes, uncomplicated: Secondary | ICD-10-CM | POA: Diagnosis present

## 2014-12-25 DIAGNOSIS — M545 Low back pain: Secondary | ICD-10-CM | POA: Diagnosis present

## 2014-12-25 DIAGNOSIS — J440 Chronic obstructive pulmonary disease with acute lower respiratory infection: Secondary | ICD-10-CM | POA: Diagnosis present

## 2014-12-25 DIAGNOSIS — Z800 Family history of malignant neoplasm of digestive organs: Secondary | ICD-10-CM | POA: Diagnosis not present

## 2014-12-25 DIAGNOSIS — J189 Pneumonia, unspecified organism: Principal | ICD-10-CM

## 2014-12-25 DIAGNOSIS — Z9981 Dependence on supplemental oxygen: Secondary | ICD-10-CM | POA: Diagnosis not present

## 2014-12-25 DIAGNOSIS — R0602 Shortness of breath: Secondary | ICD-10-CM | POA: Diagnosis present

## 2014-12-25 DIAGNOSIS — G8929 Other chronic pain: Secondary | ICD-10-CM | POA: Diagnosis present

## 2014-12-25 DIAGNOSIS — R000 Tachycardia, unspecified: Secondary | ICD-10-CM

## 2014-12-25 DIAGNOSIS — J441 Chronic obstructive pulmonary disease with (acute) exacerbation: Secondary | ICD-10-CM

## 2014-12-25 DIAGNOSIS — K219 Gastro-esophageal reflux disease without esophagitis: Secondary | ICD-10-CM | POA: Diagnosis present

## 2014-12-25 DIAGNOSIS — D72829 Elevated white blood cell count, unspecified: Secondary | ICD-10-CM

## 2014-12-25 LAB — BASIC METABOLIC PANEL
ANION GAP: 10 (ref 5–15)
BUN: 13 mg/dL (ref 6–20)
CALCIUM: 9.6 mg/dL (ref 8.9–10.3)
CO2: 28 mmol/L (ref 22–32)
Chloride: 96 mmol/L — ABNORMAL LOW (ref 101–111)
Creatinine, Ser: 0.83 mg/dL (ref 0.61–1.24)
GFR calc non Af Amer: >60 mL/min (ref >60)
Glucose, Bld: 147 mg/dL — ABNORMAL HIGH (ref 65–99)
Potassium: 4.5 mmol/L (ref 3.5–5.1)
Sodium: 134 mmol/L — ABNORMAL LOW (ref 135–145)

## 2014-12-25 LAB — CBC WITH DIFFERENTIAL/PLATELET
BASOS ABS: 0 10*3/uL (ref 0.0–0.1)
Basophils Relative: 0 %
EOS ABS: 0 10*3/uL (ref 0.0–0.7)
EOS PCT: 0 %
HCT: 53.1 % — ABNORMAL HIGH (ref 39.0–52.0)
HEMOGLOBIN: 17.5 g/dL — AB (ref 13.0–17.0)
Lymphocytes Relative: 4 %
Lymphs Abs: 1.5 10*3/uL (ref 0.7–4.0)
MCH: 29.8 pg (ref 26.0–34.0)
MCHC: 33 g/dL (ref 30.0–36.0)
MCV: 90.3 fL (ref 78.0–100.0)
MONOS PCT: 6 %
Monocytes Absolute: 2.6 10*3/uL — ABNORMAL HIGH (ref 0.1–1.0)
NEUTROS PCT: 90 %
Neutro Abs: 37.9 10*3/uL — ABNORMAL HIGH (ref 1.7–7.7)
Platelets: 290 10*3/uL (ref 150–400)
RBC: 5.88 MIL/uL — ABNORMAL HIGH (ref 4.22–5.81)
RDW: 15.5 % (ref 11.5–15.5)
WBC: 42 10*3/uL — AB (ref 4.0–10.5)

## 2014-12-25 LAB — I-STAT CG4 LACTIC ACID, ED: LACTIC ACID, VENOUS: 3.09 mmol/L — AB (ref 0.5–2.0)

## 2014-12-25 LAB — TROPONIN I: Troponin I: <0.03 ng/mL (ref <0.031)

## 2014-12-25 MED ORDER — OXYCODONE-ACETAMINOPHEN 5-325 MG PO TABS
1.0000 | ORAL_TABLET | ORAL | Status: DC | PRN
  Administered 2014-12-25 – 2014-12-27 (×8): 1 via ORAL
  Filled 2014-12-25 (×8): qty 1

## 2014-12-25 MED ORDER — ENOXAPARIN SODIUM 40 MG/0.4ML ~~LOC~~ SOLN
40.0000 mg | SUBCUTANEOUS | Status: DC
  Administered 2014-12-25 – 2014-12-26 (×2): 40 mg via SUBCUTANEOUS
  Filled 2014-12-25 (×2): qty 0.4

## 2014-12-25 MED ORDER — ACETAMINOPHEN 325 MG PO TABS
650.0000 mg | ORAL_TABLET | Freq: Once | ORAL | Status: AC
  Administered 2014-12-25: 650 mg via ORAL
  Filled 2014-12-25: qty 2

## 2014-12-25 MED ORDER — ASPIRIN EC 81 MG PO TBEC
81.0000 mg | DELAYED_RELEASE_TABLET | Freq: Every day | ORAL | Status: DC
  Administered 2014-12-26 – 2014-12-27 (×2): 81 mg via ORAL
  Filled 2014-12-25 (×2): qty 1

## 2014-12-25 MED ORDER — IPRATROPIUM BROMIDE 0.02 % IN SOLN
1.0000 mg | Freq: Once | RESPIRATORY_TRACT | Status: AC
  Administered 2014-12-25: 1 mg via RESPIRATORY_TRACT
  Filled 2014-12-25: qty 5

## 2014-12-25 MED ORDER — DEXTROSE 5 % IV SOLN: 1.0000 g | INTRAVENOUS | Status: DC

## 2014-12-25 MED ORDER — METHYLPREDNISOLONE SODIUM SUCC 125 MG IJ SOLR
125.0000 mg | Freq: Once | INTRAMUSCULAR | Status: AC
  Administered 2014-12-25: 125 mg via INTRAVENOUS
  Filled 2014-12-25: qty 2

## 2014-12-25 MED ORDER — ALBUTEROL (5 MG/ML) CONTINUOUS INHALATION SOLN
10.0000 mg/h | INHALATION_SOLUTION | Freq: Once | RESPIRATORY_TRACT | Status: AC
  Administered 2014-12-25: 10 mg/h via RESPIRATORY_TRACT
  Filled 2014-12-25: qty 20

## 2014-12-25 MED ORDER — SODIUM CHLORIDE 0.9 % IV SOLN
INTRAVENOUS | Status: DC
  Administered 2014-12-26: 75 mL/h via INTRAVENOUS

## 2014-12-25 MED ORDER — AZITHROMYCIN 500 MG IV SOLR: 500.0000 mg | INTRAVENOUS | Status: DC

## 2014-12-25 MED ORDER — ALBUTEROL SULFATE (2.5 MG/3ML) 0.083% IN NEBU
2.5000 mg | INHALATION_SOLUTION | RESPIRATORY_TRACT | Status: AC
  Administered 2014-12-25 – 2014-12-26 (×2): 2.5 mg via RESPIRATORY_TRACT
  Filled 2014-12-25 (×2): qty 3

## 2014-12-25 MED ORDER — METHYLPREDNISOLONE SODIUM SUCC 125 MG IJ SOLR
80.0000 mg | Freq: Two times a day (BID) | INTRAMUSCULAR | Status: DC
  Administered 2014-12-25 – 2014-12-27 (×4): 80 mg via INTRAVENOUS
  Filled 2014-12-25 (×4): qty 2

## 2014-12-25 MED ORDER — MOMETASONE FURO-FORMOTEROL FUM 100-5 MCG/ACT IN AERO
2.0000 | INHALATION_SPRAY | Freq: Two times a day (BID) | RESPIRATORY_TRACT | Status: DC
  Administered 2014-12-25 – 2014-12-27 (×4): 2 via RESPIRATORY_TRACT
  Filled 2014-12-25: qty 8.8

## 2014-12-25 MED ORDER — ALPRAZOLAM 1 MG PO TABS
1.0000 mg | ORAL_TABLET | Freq: Three times a day (TID) | ORAL | Status: DC
  Administered 2014-12-25 – 2014-12-27 (×5): 1 mg via ORAL
  Filled 2014-12-25 (×5): qty 1

## 2014-12-25 MED ORDER — VARENICLINE TARTRATE 1 MG PO TABS
1.0000 mg | ORAL_TABLET | Freq: Two times a day (BID) | ORAL | Status: DC
  Administered 2014-12-25 – 2014-12-27 (×4): 1 mg via ORAL
  Filled 2014-12-25 (×8): qty 1

## 2014-12-25 MED ORDER — ONDANSETRON HCL 4 MG/2ML IJ SOLN: 4.0000 mg | Freq: Four times a day (QID) | INTRAMUSCULAR | Status: DC | PRN

## 2014-12-25 MED ORDER — ALBUTEROL SULFATE (2.5 MG/3ML) 0.083% IN NEBU: 2.5000 mg | INHALATION_SOLUTION | RESPIRATORY_TRACT | Status: DC | PRN

## 2014-12-25 MED ORDER — ONDANSETRON HCL 4 MG PO TABS: 4.0000 mg | ORAL_TABLET | Freq: Four times a day (QID) | ORAL | Status: DC | PRN

## 2014-12-25 MED ORDER — OXYCODONE HCL 5 MG PO TABS
5.0000 mg | ORAL_TABLET | ORAL | Status: DC | PRN
  Administered 2014-12-25 – 2014-12-27 (×7): 5 mg via ORAL
  Filled 2014-12-25 (×7): qty 1

## 2014-12-25 MED ORDER — DEXTROSE 5 % IV SOLN
500.0000 mg | INTRAVENOUS | Status: DC
  Administered 2014-12-26: 500 mg via INTRAVENOUS
  Filled 2014-12-25 (×3): qty 500

## 2014-12-25 MED ORDER — SODIUM CHLORIDE 0.9 % IV SOLN: INTRAVENOUS | Status: DC

## 2014-12-25 MED ORDER — DEXTROSE 5 % IV SOLN
500.0000 mg | Freq: Once | INTRAVENOUS | Status: AC
  Administered 2014-12-25: 500 mg via INTRAVENOUS
  Filled 2014-12-25: qty 500

## 2014-12-25 MED ORDER — DEXTROSE 5 % IV SOLN
1.0000 g | INTRAVENOUS | Status: DC
  Administered 2014-12-26: 1 g via INTRAVENOUS
  Filled 2014-12-25 (×3): qty 10

## 2014-12-25 MED ORDER — SODIUM CHLORIDE 0.9 % IV BOLUS (SEPSIS)
1000.0000 mL | Freq: Once | INTRAVENOUS | Status: AC
  Administered 2014-12-25: 1000 mL via INTRAVENOUS

## 2014-12-25 MED ORDER — SODIUM CHLORIDE 0.9 % IJ SOLN
3.0000 mL | Freq: Two times a day (BID) | INTRAMUSCULAR | Status: DC
  Administered 2014-12-25 – 2014-12-26 (×2): 3 mL via INTRAVENOUS

## 2014-12-25 MED ORDER — OXYCODONE-ACETAMINOPHEN 10-325 MG PO TABS: 1.0000 | ORAL_TABLET | ORAL | Status: DC | PRN

## 2014-12-25 MED ORDER — DEXTROSE 5 % IV SOLN
1.0000 g | Freq: Once | INTRAVENOUS | Status: AC
  Administered 2014-12-25: 1 g via INTRAVENOUS
  Filled 2014-12-25: qty 10

## 2014-12-25 NOTE — ED Provider Notes (Signed)
CSN: [id number]     Arrival date & time 12/25/14  1249 History   First MD Initiated Contact with Patient 12/25/14 1306     Chief Complaint  Patient presents with  . Shortness of Breath      HPI Pt was seen at 1305.  Per pt, c/o gradual onset and worsening of persistent cough, wheezing and SOB for the past 3 to 4 days.  Describes his symptoms as "my COPD is acting up."  Has been using home O2, MDI and nebs without relief. Describes his cough as productive of "thick yellow" sputum. Has been associated with constant CP, which worsens when he coughs, as well as one home fever to "101" yesterday.  Denies palpitations, no back pain, no abd pain, no N/V/D, no rash.     Past Medical History  Diagnosis Date  . COPD (chronic obstructive pulmonary disease) (HCC)     Severe centrilobular emphysema, on home o2  . Hypercholesterolemia   . S/P endoscopy March 2012    Reflux esophagitis, no ulcerations  . S/P colonoscopy March 2012    Normal  . Pulmonary nodule 07/04/2011  . GERD (gastroesophageal reflux disease)    Past Surgical History  Procedure Laterality Date  . Cholecystectomy    . Appendectomy    . Right inguinal hernia repair    . Hernia repair    . Knee surgery     Family History  Problem Relation Age of Onset  . Cirrhosis Father     Deceased, ETOH cirrhosis  . Stomach cancer      Aunt   Social History  Substance Use Topics  . Smoking status: Current Every Day Smoker -- 0.50 packs/day for 30 years    Types: Cigarettes  . Smokeless tobacco: Never Used  . Alcohol Use: 0.6 oz/week    1 Standard drinks or equivalent per week     Comment: Occasional    Review of Systems ROS: Statement: All systems negative except as marked or noted in the HPI; Constitutional: +fever and chills. ; ; Eyes: Negative for eye pain, redness and discharge. ; ; ENMT: Negative for ear pain, hoarseness, nasal congestion, sinus pressure and sore throat. ; ; Cardiovascular: +CP. Negative for palpitations,  diaphoresis, and peripheral edema. ; ; Respiratory: +cough, wheezing, SOB. Negative for stridor. ; ; Gastrointestinal: Negative for nausea, vomiting, diarrhea, abdominal pain, blood in stool, hematemesis, jaundice and rectal bleeding. . ; ; Genitourinary: Negative for dysuria, flank pain and hematuria. ; ; Musculoskeletal: Negative for back pain and neck pain. Negative for swelling and trauma.; ; Skin: Negative for pruritus, rash, abrasions, blisters, bruising and skin lesion.; ; Neuro: Negative for headache, lightheadedness and neck stiffness. Negative for weakness, altered level of consciousness , altered mental status, extremity weakness, paresthesias, involuntary movement, seizure and syncope.      Allergies  Codeine  Home Medications   Prior to Admission medications   Medication Sig Start Date End Date Taking? Authorizing Provider  albuterol (PROVENTIL) (5 MG/ML) 0.5% nebulizer solution Take 0.5 mLs (2.5 mg total) by nebulization every 4 (four) hours as needed for wheezing or shortness of breath. 04/18/12   Noemi Chapel, MD  ALPRAZolam Duanne Moron) 1 MG tablet Take 1 mg by mouth 3 (three) times daily.    Historical Provider, MD  amoxicillin-clavulanate (AUGMENTIN) 875-125 MG per tablet Take 1 tablet by mouth 2 (two) times daily. 08/24/14   Sinda Du, MD  aspirin EC 81 MG tablet Take 81 mg by mouth daily.    Historical  Provider, MD  Fluticasone-Salmeterol (ADVAIR DISKUS) 250-50 MCG/DOSE AEPB Inhale 1 puff into the lungs every 12 (twelve) hours.      Historical Provider, MD  Oxycodone HCl 10 MG TABS Take 1 tablet (10 mg total) by mouth 4 (four) times daily as needed (pain). Patient not taking: Reported on 07/01/2014 06/25/13   Sinda Du, MD  predniSONE (DELTASONE) 10 MG tablet Take 1 tablet (10 mg total) by mouth daily with breakfast. 4x3 d,3x3d,2x3d,1x3d. 08/24/14   Sinda Du, MD  varenicline (CHANTIX CONTINUING MONTH PAK) 1 MG tablet Take 1 tablet (1 mg total) by mouth 2 (two) times  daily. 08/24/14   Sinda Du, MD  varenicline (CHANTIX STARTING MONTH PAK) 0.5 MG X 11 & 1 MG X 42 tablet Take one 0.5 mg tablet by mouth once daily for 3 days, then increase to one 0.5 mg tablet twice daily for 4 days, then increase to one 1 mg tablet twice daily. 08/24/14   Sinda Du, MD   BP 132/105 mmHg  Pulse 135  Temp(Src) 98.5 F (36.9 C) (Oral)  Resp 24  Ht 5\' 8"  (1.727 m)  Wt 135 lb (61.236 kg)  BMI 20.53 kg/m2  SpO2 91% Physical Exam  1310: Physical examination:  Nursing notes reviewed; Vital signs and O2 SAT reviewed;  Constitutional: Well developed, Well nourished, Uncomfortable appearing.; Head:  Normocephalic, atraumatic; Eyes: EOMI, PERRL, No scleral icterus; ENMT: +wax left ear canal. Right TM clear. Mouth and pharynx normal, Mucous membranes dry; Neck: Supple, Full range of motion, No lymphadenopathy; Cardiovascular: Tachycardic rate and rhythm, No gallop; Respiratory: Breath sounds diminished & equal bilaterally. Speaking short phrases, sitting upright, tachypneic.; Chest: Nontender, Movement normal; Abdomen: Soft, Nontender, Nondistended, Normal bowel sounds; Genitourinary: No CVA tenderness; Extremities: Pulses normal, No tenderness, No edema, No calf edema or asymmetry.; Neuro: AA&Ox3, Major CN grossly intact.  Speech clear. No gross focal motor deficits in extremities.; Skin: Color normal, Warm, Dry.   ED Course  Procedures (including critical care time) Labs Review   Imaging Review  I have personally reviewed and evaluated these images and lab results as part of my medical decision-making.   EKG Interpretation   Date/Time:  Thursday December 25 2014 12:56:17 EST Ventricular Rate:  133 PR Interval:  130 QRS Duration: 84 QT Interval:  290 QTC Calculation: 431 R Axis:   95 Text Interpretation:  Sinus tachycardia Rightward axis Pulmonary disease  pattern Baseline wander When compared with ECG of 08/23/2014 Rate faster  Confirmed by [facility]  MD, Nunzio Cory  [phone]) on 12/25/2014 1:15:47 PM      MDM  MDM Reviewed: previous chart, nursing note and vitals Reviewed previous: labs and ECG Interpretation: labs, ECG and x-ray Total time providing critical care: 30-74 minutes. This excludes time spent performing separately reportable procedures and services. Consults: admitting MD     CRITICAL CARE Performed by: Alfonzo Feller Total critical care time: 35 minutes Critical care time was exclusive of separately billable procedures and treating other patients. Critical care was necessary to treat or prevent imminent or life-threatening deterioration. Critical care was time spent personally by me on the following activities: development of treatment plan with patient and/or surrogate as well as nursing, discussions with consultants, evaluation of patient's response to treatment, examination of patient, obtaining history from patient or surrogate, ordering and performing treatments and interventions, ordering and review of laboratory studies, ordering and review of radiographic studies, pulse oximetry and re-evaluation of patient's condition.   Results for orders placed or performed during the hospital encounter  of [id number]  Basic metabolic panel  Result Value Ref Range   Sodium 134 (L) 135 - 145 mmol/L   Potassium 4.5 3.5 - 5.1 mmol/L   Chloride 96 (L) 101 - 111 mmol/L   CO2 28 22 - 32 mmol/L   Glucose, Bld 147 (H) 65 - 99 mg/dL   BUN 13 6 - 20 mg/dL   Creatinine, Ser 0.83 0.61 - 1.24 mg/dL   Calcium 9.6 8.9 - 10.3 mg/dL   GFR calc non Af Amer >60 >60 mL/min   GFR calc Af Amer >60 >60 mL/min   Anion gap 10 5 - 15  Troponin I  Result Value Ref Range   Troponin I <0.03 <0.031 ng/mL  CBC with Differential  Result Value Ref Range   WBC 42.0 (H) 4.0 - 10.5 K/uL   RBC 5.88 (H) 4.22 - 5.81 MIL/uL   Hemoglobin 17.5 (H) 13.0 - 17.0 g/dL   HCT 53.1 (H) 39.0 - 52.0 %   MCV 90.3 78.0 - 100.0 fL   MCH 29.8 26.0 - 34.0 pg   MCHC 33.0 30.0 -  36.0 g/dL   RDW 15.5 11.5 - 15.5 %   Platelets 290 150 - 400 K/uL   Neutrophils Relative % 90 %   Neutro Abs 37.9 (H) 1.7 - 7.7 K/uL   Lymphocytes Relative 4 %   Lymphs Abs 1.5 0.7 - 4.0 K/uL   Monocytes Relative 6 %   Monocytes Absolute 2.6 (H) 0.1 - 1.0 K/uL   Eosinophils Relative 0 %   Eosinophils Absolute 0.0 0.0 - 0.7 K/uL   Basophils Relative 0 %   Basophils Absolute 0.0 0.0 - 0.1 K/uL   WBC Morphology WHITE COUNT CONFIRMED ON SMEAR   I-Stat CG4 Lactic Acid, ED  Result Value Ref Range   Lactic Acid, Venous 3.09 (HH) 0.5 - 2.0 mmol/L   Comment NOTIFIED PHYSICIAN    Dg Chest Port 1 View 12/25/2014  CLINICAL DATA:  Productive cough and shortness of breath for 4 days EXAM: PORTABLE CHEST - 1 VIEW COMPARISON:  08/23/2014 FINDINGS: Cardiac shadow is stable. The lungs are hyperinflated consistent with COPD. Mild interstitial changes are again noted bilaterally. The markings are slightly more prominent in the medial right lung base which may represent some early infiltrate. There is a somewhat rounded density identified in the lateral aspect of the right apex. This may represent a healing rib fracture. This was not present on the prior exam. IMPRESSION: Early right basilar infiltrate superimposed on COPD. Followup PA and lateral chest X-ray is recommended in 3-4 weeks following trial of antibiotic therapy to ensure resolution and exclude underlying malignancy. Vague somewhat rounded density in the right apex. This can also be followed on subsequent chest x-rays. Electronically Signed   By: Inez Catalina M.D.   On: 12/25/2014 13:40    1535:  On arrival: pt sitting upright, speaking short phrases, diminished lung sounds; IV solumedrol and hour long neb started. Neb nearly completed: pt now leaning back on stretcher, less tachypneic, speaking full sentences without distress. Lungs continue diminished, Sats low 90's. IVF given for elevated lactic acid and clinical dehydration. IV abx started for CAP.  Dx and testing d/w pt and family.  Questions answered.  Verb understanding, agreeable to admit. T/C to [facility]. Anastasio Champion, case discussed, including:  HPI, pertinent PM/SHx, VS/PE, dx testing, ED course and treatment:  Agreeable to admit, requests to write temporary orders, obtain medical bed to team APAdmits.   Francine Graven, DO 12/28/14 1942

## 2014-12-25 NOTE — ED Notes (Signed)
Pt reports cough, sob, and chest pain for the past few days.  Reports chest pain worse with coughing.

## 2014-12-25 NOTE — ED Notes (Signed)
Attempted to give report, RN unavailable.

## 2014-12-25 NOTE — H&P (Signed)
[facility] History and Physical  Cameron Villarreal [id number] DOB: March 26, 1965 DOA: 12/25/2014  Referring physician: ER PCP: Alonza Bogus, MD   Chief Complaint: Dyspnea  HPI: Cameron Villarreal is a 49 y.o. male  This is a 49 year old man, oxygen dependent COPD, now presents with 3-4 day history of increasing dyspnea with cough productive of thick yellow sputum. He also has had fever at home of 101. He denies any nausea, vomiting or abdominal pain. He has had some constant chest pain which is likely related to his coughing. Evaluation in the emergency room showed him to have pneumonia on the right side along with a COPD exacerbation. He is now being admitted for further management.   Review of Systems:  Apart from symptoms above, all systems are negative.  Past Medical History  Diagnosis Date  . COPD (chronic obstructive pulmonary disease) (HCC)     Severe centrilobular emphysema, on home o2  . Hypercholesterolemia   . S/P endoscopy March 2012    Reflux esophagitis, no ulcerations  . S/P colonoscopy March 2012    Normal  . Pulmonary nodule 07/04/2011  . GERD (gastroesophageal reflux disease)    Past Surgical History  Procedure Laterality Date  . Cholecystectomy    . Appendectomy    . Right inguinal hernia repair    . Hernia repair    . Knee surgery     Social History:  reports that he has been smoking Cigarettes.  He has a 15 pack-year smoking history. He has never used smokeless tobacco. He reports that he drinks about 0.6 oz of alcohol per week. He reports that he does not use illicit drugs.  Allergies  Allergen Reactions  . Codeine Itching and Nausea Only    Family History  Problem Relation Age of Onset  . Cirrhosis Father     Deceased, ETOH cirrhosis  . Stomach cancer      Aunt    Prior to Admission medications   Medication Sig Start Date End Date Taking? Authorizing Provider  albuterol (PROVENTIL) (5 MG/ML) 0.5% nebulizer solution Take 0.5 mLs (2.5 mg  total) by nebulization every 4 (four) hours as needed for wheezing or shortness of breath. 04/18/12  Yes Noemi Chapel, MD  ALPRAZolam Duanne Moron) 1 MG tablet Take 1 mg by mouth 3 (three) times daily.   Yes Historical Provider, MD  aspirin EC 81 MG tablet Take 81 mg by mouth daily.   Yes Historical Provider, MD  Fluticasone-Salmeterol (ADVAIR DISKUS) 250-50 MCG/DOSE AEPB Inhale 1 puff into the lungs every 12 (twelve) hours.     Yes Historical Provider, MD  oxyCODONE-acetaminophen (PERCOCET) 10-325 MG tablet Take 1 tablet by mouth every 4 (four) hours as needed for pain.   Yes Historical Provider, MD  varenicline (CHANTIX CONTINUING MONTH PAK) 1 MG tablet Take 1 tablet (1 mg total) by mouth 2 (two) times daily. 08/24/14  Yes Sinda Du, MD  amoxicillin-clavulanate (AUGMENTIN) 875-125 MG per tablet Take 1 tablet by mouth 2 (two) times daily. Patient not taking: Reported on 12/25/2014 08/24/14   Sinda Du, MD  predniSONE (DELTASONE) 10 MG tablet Take 1 tablet (10 mg total) by mouth daily with breakfast. 4x3 d,3x3d,2x3d,1x3d. Patient not taking: Reported on 12/25/2014 08/24/14   Sinda Du, MD   Physical Exam: Filed Vitals:   12/25/14 1400 12/25/14 1430 12/25/14 1500 12/25/14 1530  BP: 126/80 121/90 123/83 117/79  Pulse: 130 132 131 123  Temp:      TempSrc:      Resp: 20 15  21 19  Height:      Weight:      SpO2: 89% 89% 91% 92%    Wt Readings from Last 3 Encounters:  12/25/14 61.236 kg (135 lb)  08/24/14 57.2 kg (126 lb 1.7 oz)  07/01/14 59.421 kg (131 lb)    General:  Appears calm and comfortable. He does not look toxic or septic. Eyes: PERRL, normal lids, irises & conjunctiva ENT: grossly normal hearing, lips & tongue Neck: no LAD, masses or thyromegaly Cardiovascular: RRR, no m/r/g. No LE edema. Telemetry: SR, no arrhythmias  Respiratory: There is reduced air entry all around but there does appear to be crackles in the right mid and lower zones. There is no bronchial  breathing. Abdomen: soft, ntnd Skin: no rash or induration seen on limited exam Musculoskeletal: grossly normal tone BUE/BLE Psychiatric: grossly normal mood and affect, speech fluent and appropriate Neurologic: grossly non-focal.          Labs on Admission:  Basic Metabolic Panel:  Recent Labs Lab 12/25/14 1310  NA 134*  K 4.5  CL 96*  CO2 28  GLUCOSE 147*  BUN 13  CREATININE 0.83  CALCIUM 9.6   Liver Function Tests: No results for input(s): AST, ALT, ALKPHOS, BILITOT, PROT, ALBUMIN in the last 168 hours. No results for input(s): LIPASE, AMYLASE in the last 168 hours. No results for input(s): AMMONIA in the last 168 hours. CBC:  Recent Labs Lab 12/25/14 1310  WBC 42.0*  NEUTROABS 37.9*  HGB 17.5*  HCT 53.1*  MCV 90.3  PLT 290   Cardiac Enzymes:  Recent Labs Lab 12/25/14 1310  TROPONINI <0.03    BNP (last 3 results) No results for input(s): BNP in the last 8760 hours.  ProBNP (last 3 results) No results for input(s): PROBNP in the last 8760 hours.  CBG: No results for input(s): GLUCAP in the last 168 hours.  Radiological Exams on Admission: Dg Chest Port 1 View  12/25/2014  CLINICAL DATA:  Productive cough and shortness of breath for 4 days EXAM: PORTABLE CHEST - 1 VIEW COMPARISON:  08/23/2014 FINDINGS: Cardiac shadow is stable. The lungs are hyperinflated consistent with COPD. Mild interstitial changes are again noted bilaterally. The markings are slightly more prominent in the medial right lung base which may represent some early infiltrate. There is a somewhat rounded density identified in the lateral aspect of the right apex. This may represent a healing rib fracture. This was not present on the prior exam. IMPRESSION: Early right basilar infiltrate superimposed on COPD. Followup PA and lateral chest X-ray is recommended in 3-4 weeks following trial of antibiotic therapy to ensure resolution and exclude underlying malignancy. Vague somewhat rounded  density in the right apex. This can also be followed on subsequent chest x-rays. Electronically Signed   By: Inez Catalina M.D.   On: 12/25/2014 13:40      Assessment/Plan   1. COPD with acute exacerbation. He will be treated with nebulizers and IV steroids. 2. Community-acquired pneumonia. He appears to have a right-sided pneumonia. He will be given antibiotics.  He'll be admitted to telemetry floor. Further recommendations will depend on patient's hospital progress.   Code Status: Full code.   DVT Prophylaxis: Lovenox.  Family Communication: I discussed the plan with the patient at the bedside.   Disposition Plan: Home when medically stable.   Time spent: 60 minutes.  Doree Albee [facility] Pager [phone].

## 2014-12-25 NOTE — ED Notes (Signed)
Pt c/o SOB, cough, headache x several days. Pt reports productive cough with thick yellow sputum. Pt reports he uses nebulizers at home but they haven't been helping. Pt wear 2L O2 at night. Pt also reports fever a few days ago of 101 that went away with Aspirin.

## 2014-12-26 LAB — CBC
HEMATOCRIT: 46.5 % (ref 39.0–52.0)
HEMOGLOBIN: 14.9 g/dL (ref 13.0–17.0)
MCH: 29.7 pg (ref 26.0–34.0)
MCHC: 32 g/dL (ref 30.0–36.0)
MCV: 92.6 fL (ref 78.0–100.0)
Platelets: 242 10*3/uL (ref 150–400)
RBC: 5.02 MIL/uL (ref 4.22–5.81)
RDW: 14.9 % (ref 11.5–15.5)
WBC: 30 10*3/uL — ABNORMAL HIGH (ref 4.0–10.5)

## 2014-12-26 LAB — COMPREHENSIVE METABOLIC PANEL
ALT: 12 U/L — ABNORMAL LOW (ref 17–63)
ANION GAP: 7 (ref 5–15)
AST: 21 U/L (ref 15–41)
Albumin: 3.1 g/dL — ABNORMAL LOW (ref 3.5–5.0)
Alkaline Phosphatase: 82 U/L (ref 38–126)
BILIRUBIN TOTAL: 0.5 mg/dL (ref 0.3–1.2)
BUN: 12 mg/dL (ref 6–20)
CALCIUM: 9 mg/dL (ref 8.9–10.3)
CO2: 29 mmol/L (ref 22–32)
Chloride: 102 mmol/L (ref 101–111)
Creatinine, Ser: 0.53 mg/dL — ABNORMAL LOW (ref 0.61–1.24)
GFR calc Af Amer: >60 mL/min (ref >60)
GLUCOSE: 142 mg/dL — AB (ref 65–99)
Potassium: 4.2 mmol/L (ref 3.5–5.1)
Sodium: 138 mmol/L (ref 135–145)
TOTAL PROTEIN: 7 g/dL (ref 6.5–8.1)

## 2014-12-26 MED ORDER — GUAIFENESIN ER 600 MG PO TB12
1200.0000 mg | ORAL_TABLET | Freq: Two times a day (BID) | ORAL | Status: DC
  Administered 2014-12-26 – 2014-12-27 (×3): 1200 mg via ORAL
  Filled 2014-12-26 (×3): qty 2

## 2014-12-26 MED ORDER — BENZONATATE 100 MG PO CAPS
200.0000 mg | ORAL_CAPSULE | Freq: Three times a day (TID) | ORAL | Status: DC
  Administered 2014-12-26 – 2014-12-27 (×3): 200 mg via ORAL
  Filled 2014-12-26 (×3): qty 2

## 2014-12-26 NOTE — Progress Notes (Signed)
Pt complained of coughing. MD notified/aware. Orders given and followed. RN will continue to monitor. Oswald Hillock, RN

## 2014-12-26 NOTE — Progress Notes (Signed)
Subjective: He was admitted yesterday with COPD exacerbation and what appears to be pneumonia. He says he feels better. He is coughing mostly nonproductively.  Objective: Vital signs in last 24 hours: Temp:  [97.5 F (36.4 C)-98.5 F (36.9 C)] 98.1 F (36.7 C) (11/18 0448) Pulse Rate:  [82-135] 82 (11/18 0448) Resp:  [15-30] 24 (11/17 2200) BP: (98-132)/(67-105) 98/71 mmHg (11/18 0448) SpO2:  [87 %-95 %] 89 % (11/18 0822) Weight:  [55 kg (121 lb 4.1 oz)-61.236 kg (135 lb)] 55 kg (121 lb 4.1 oz) (11/17 1701) Weight change:     Intake/Output from previous day: 11/17 0701 - 11/18 0700 In: 267.5 [P.O.:240; I.V.:27.5] Out: -   PHYSICAL EXAM General appearance: alert, cooperative and mild distress Resp: rhonchi bilaterally Cardio: regular rate and rhythm, S1, S2 normal, no murmur, click, rub or gallop GI: soft, non-tender; bowel sounds normal; no masses,  no organomegaly Extremities: extremities normal, atraumatic, no cyanosis or edema  Lab Results:  Results for orders placed or performed during the hospital encounter of 12/25/14 (from the past 48 hour(s))  Basic metabolic panel     Status: Abnormal   Collection Time: 12/25/14  1:10 PM  Result Value Ref Range   Sodium 134 (Villarreal) 135 - 145 mmol/Villarreal   Potassium 4.5 3.5 - 5.1 mmol/Vill[place]real   Chloride 96 (Villarreal) 101 - 111 mmol/Villarreal   CO2 28 22 - 32 mmol/Vill[place]real   Glucose, Bld 147 (H) 65 - 99 mg/dL   BUN 13 6 - 20 mg/dL   Creatinine, Ser 0.83 0.61 - 1.24 mg/dL   Calcium 9.6 8.9 - 10.3 mg/dL   GFR calc non Af Amer >60 >60 mL/min   GFR calc Af Amer >60 >60 mL/min    Comment: (NOTE) The eGFR has been calculated using the CKD EPI equation. This calculation has not been validated in all clinical situations. eGFR's persistently <60 mL/min signify possible Chronic Kidney Disease.    Anion gap 10 5 - 15  Troponin I     Status: None   Collection Time: 12/25/14  1:10 PM  Result Value Ref Range   Troponin I <0.03 <0.031 ng/mL    Comment:        NO  INDICATION OF MYOCARDIAL INJURY.   CBC with Differential     Status: Abnormal   Collection Time: 12/25/14  1:10 PM  Result Value Ref Range   WBC 42.0 (H) 4.0 - 10.5 K/uL   RBC 5.88 (H) 4.22 - 5.81 MIL/uL   Hemoglobin 17.5 (H) 13.0 - 17.0 g/dL   HCT 53.1 (H) 39.0 - 52.0 %   MCV 90.3 78.0 - 100.0 fL   MCH 29.8 26.0 - 34.0 pg   MCHC 33.0 30.0 - 36.0 g/dL   RDW 15.5 11.5 - 15.5 %   Platelets 290 150 - 400 K/uL    Comment: SPECIMEN CHECKED FOR CLOTS PLATELET COUNT CONFIRMED BY SMEAR LARGE PLATELETS PRESENT    Neutrophils Relative % 90 %   Neutro Abs 37.9 (H) 1.7 - 7.7 K/uL   Lymphocytes Relative 4 %   Lymphs Abs 1.5 0.7 - 4.0 K/uL   Monocytes Relative 6 %   Monocytes Absolute 2.6 (H) 0.1 - 1.0 K/uL   Eosinophils Relative 0 %   Eosinophils Absolute 0.0 0.0 - 0.7 K/uL   Basophils Relative 0 %   Basophils Absolute 0.0 0.0 - 0.1 K/uL   WBC Morphology WHITE COUNT CONFIRMED ON SMEAR   I-Stat CG4 Lactic Acid, ED     Status: Abnormal  Collection Time: 12/25/14  2:03 PM  Result Value Ref Range   Lactic Acid, Venous 3.09 (HH) 0.5 - 2.0 mmol/[place]real   Comment NOTIFIED PHYSICIAN   Comprehensive metabolic panel     Status: Abnormal   Collection Time: 12/26/14  6:35 AM  Result Value Ref Range   Sodium 138 135 - 145 mmol/[place]   Potassium 4.2 3.5 - 5.1 mmol/[place]   Chloride 102 101 - 111 mmol/[place]real   CO2 29 22 - 32 mmol/[place]   Glucose, Bld 142 (H) 65 - 99 mg/dL   BUN 12 6 - 20 mg/dL   Creatinine, Ser 0.53 ([place]) 0.61 - 1.24 mg/dL   Calcium 9.0 8.9 - 10.3 mg/dL   Total Protein 7.0 6.5 - 8.1 g/dL   Albumin 3.1 ([place]) 3.5 - 5.0 g/dL   AST 21 15 - 41 U/Vill[place]real   ALT 12 (Vill[place]real) 17 - 63 U/[place]   Alkaline Phosphatase 82 38 - 126 U/[place]   Total Bilirubin 0.5 0.3 - 1.2 mg/dL   GFR calc non Af Amer >60 >60 mL/min   GFR calc Af Amer >60 >60 mL/min    Comment: (NOTE) The eGFR has been calculated using the CKD EPI equation. This calculation has not been validated in all clinical situations. eGFR's persistently <60 mL/min signify  possible Chronic Kidney Disease.    Anion gap 7 5 - 15  CBC     Status: Abnormal   Collection Time: 12/26/14  6:35 AM  Result Value Ref Range   WBC 30.0 (H) 4.0 - 10.5 K/uL   RBC 5.02 4.22 - 5.81 MIL/uL   Hemoglobin 14.9 13.0 - 17.0 g/dL   HCT 46.5 39.0 - 52.0 %   MCV 92.6 78.0 - 100.0 fL   MCH 29.7 26.0 - 34.0 pg   MCHC 32.0 30.0 - 36.0 g/dL   RDW 14.9 11.5 - 15.5 %   Platelets 242 150 - 400 K/uL    ABGS No results for input(s): PHART, PO2ART, TCO2, HCO3 in the last 72 hours.  Invalid input(s): PCO2 CULTURES No results found for this or any previous visit (from the past 240 hour(s)). Studies/Results: Dg Chest Port 1 View  12/25/2014  CLINICAL DATA:  Productive cough and shortness of breath for 4 days EXAM: PORTABLE CHEST - 1 VIEW COMPARISON:  08/23/2014 FINDINGS: Cardiac shadow is stable. The lungs are hyperinflated consistent with COPD. Mild interstitial changes are again noted bilaterally. The markings are slightly more prominent in the medial right lung base which may represent some early infiltrate. There is a somewhat rounded density identified in the lateral aspect of the right apex. This may represent a healing rib fracture. This was not present on the prior exam. IMPRESSION: Early right basilar infiltrate superimposed on COPD. Followup PA and lateral chest X-ray is recommended in 3-4 weeks following trial of antibiotic therapy to ensure resolution and exclude underlying malignancy. Vague somewhat rounded density in the right apex. This can also be followed on subsequent chest x-rays. Electronically Signed   By: Inez Catalina M.D.   On: 12/25/2014 13:40    Medications:  Prior to Admission:  Prescriptions prior to admission  Medication Sig Dispense Refill Last Dose  . albuterol (PROVENTIL) (5 MG/ML) 0.5% nebulizer solution Take 0.5 mLs (2.5 mg total) by nebulization every 4 (four) hours as needed for wheezing or shortness of breath. 20 mL 12 12/25/2014 at Unknown time  .  ALPRAZolam (XANAX) 1 MG tablet Take 1 mg by mouth 3 (three) times daily.   12/25/2014  at Unknown time  . aspirin EC 81 MG tablet Take 81 mg by mouth daily.   12/25/2014 at Unknown time  . Fluticasone-Salmeterol (ADVAIR DISKUS) 250-50 MCG/DOSE AEPB Inhale 1 puff into the lungs every 12 (twelve) hours.     Past Week at Unknown time  . oxyCODONE-acetaminophen (PERCOCET) 10-325 MG tablet Take 1 tablet by mouth every 4 (four) hours as needed for pain.   12/25/2014 at Unknown time  . varenicline (CHANTIX CONTINUING MONTH PAK) 1 MG tablet Take 1 tablet (1 mg total) by mouth 2 (two) times daily. 60 tablet 2 12/24/2014 at Unknown time  . amoxicillin-clavulanate (AUGMENTIN) 875-125 MG per tablet Take 1 tablet by mouth 2 (two) times daily. (Patient not taking: Reported on 12/25/2014) 14 tablet 3   . predniSONE (DELTASONE) 10 MG tablet Take 1 tablet (10 mg total) by mouth daily with breakfast. 4x3 d,3x3d,2x3d,1x3d. (Patient not taking: Reported on 12/25/2014) 30 tablet 2    Scheduled: . ALPRAZolam  1 mg Oral TID  . aspirin EC  81 mg Oral Daily  . azithromycin  500 mg Intravenous Q24H  . cefTRIAXone (ROCEPHIN)  IV  1 g Intravenous Q24H  . enoxaparin (LOVENOX) injection  40 mg Subcutaneous Q24H  . guaiFENesin  1,200 mg Oral BID  . methylPREDNISolone (SOLU-MEDROL) injection  80 mg Intravenous Q12H  . mometasone-formoterol  2 puff Inhalation BID  . sodium chloride  3 mL Intravenous Q12H  . varenicline  1 mg Oral BID   Continuous: . sodium chloride 75 mL/hr (12/26/14 0630)   PZW:CHENIDPOE, ondansetron **OR** ondansetron (ZOFRAN) IV, oxyCODONE-acetaminophen **AND** oxyCODONE  Assesment: He was admitted with community-acquired pneumonia. He has severe COPD at baseline. He has acute on chronic hypoxic respiratory failure. Active Problems:   CAP (community acquired pneumonia)   COPD exacerbation ([place])   COPD with acute exacerbation ([place])    Plan: Continue current treatments. I don't think he is ready for  discharge.    LOS: 1 day   Cameron Villarreal 12/26/2014, 8:38 AM

## 2014-12-26 NOTE — Care Management Note (Signed)
Case Management Note  Patient Details  Name: JONOTHON BRUNELLE MRN: [id number] Date of Birth: 1966-01-23  Subjective/Objective:                  Pt admitted with COPD and pneumonia. Pt lives with his mother and will return home at discharge. Pt is independent with ADL's. Pt has home O2 with [employer]. Pt also has a neb machine for home use.  Action/Plan: No CM needs anticipated.   Expected Discharge Date:  12/29/14               Expected Discharge Plan:  Home/Self Care  In-House Referral:  NA  Discharge planning Services  CM Consult  Post Acute Care Choice:  NA Choice offered to:  NA  DME Arranged:    DME Agency:     HH Arranged:    HH Agency:     Status of Service:  Completed, signed off  Medicare Important Message Given:    Date Medicare IM Given:    Medicare IM give by:    Date Additional Medicare IM Given:    Additional Medicare Important Message give by:     If discussed at [place] of Stay Meetings, dates discussed:    Additional Comments:  Joylene Draft, RN 12/26/2014, 10:49 AM

## 2014-12-27 LAB — CBC WITH DIFFERENTIAL/PLATELET
Basophils Absolute: 0 10*3/uL (ref 0.0–0.1)
Basophils Relative: 0 %
EOS ABS: 0 10*3/uL (ref 0.0–0.7)
Eosinophils Relative: 0 %
HEMATOCRIT: 42.8 % (ref 39.0–52.0)
HEMOGLOBIN: 13.5 g/dL (ref 13.0–17.0)
LYMPHS ABS: 1.3 10*3/uL (ref 0.7–4.0)
Lymphocytes Relative: 3 %
MCH: 29.8 pg (ref 26.0–34.0)
MCHC: 31.5 g/dL (ref 30.0–36.0)
MCV: 94.5 fL (ref 78.0–100.0)
Monocytes Absolute: 1.5 10*3/uL — ABNORMAL HIGH (ref 0.1–1.0)
Monocytes Relative: 4 %
NEUTROS ABS: 36.1 10*3/uL — AB (ref 1.7–7.7)
NEUTROS PCT: 93 %
Platelets: 292 10*3/uL (ref 150–400)
RBC: 4.53 MIL/uL (ref 4.22–5.81)
RDW: 14.8 % (ref 11.5–15.5)
WBC: 39 10*3/uL — AB (ref 4.0–10.5)

## 2014-12-27 MED ORDER — BENZONATATE 200 MG PO CAPS: 200.0000 mg | ORAL_CAPSULE | Freq: Three times a day (TID) | ORAL | Status: DC

## 2014-12-27 MED ORDER — PREDNISONE 10 MG PO TABS: 10.0000 mg | ORAL_TABLET | Freq: Every day | ORAL | Status: DC

## 2014-12-27 MED ORDER — ALBUTEROL SULFATE (5 MG/ML) 0.5% IN NEBU: 2.5000 mg | INHALATION_SOLUTION | RESPIRATORY_TRACT | Status: DC | PRN

## 2014-12-27 MED ORDER — GUAIFENESIN ER 600 MG PO TB12: 1200.0000 mg | ORAL_TABLET | Freq: Two times a day (BID) | ORAL | Status: DC

## 2014-12-27 MED ORDER — AMOXICILLIN-POT CLAVULANATE 875-125 MG PO TABS: 1.0000 | ORAL_TABLET | Freq: Two times a day (BID) | ORAL | Status: DC

## 2014-12-27 MED ORDER — TIOTROPIUM BROMIDE MONOHYDRATE 18 MCG IN CAPS: 18.0000 ug | ORAL_CAPSULE | Freq: Every morning | RESPIRATORY_TRACT | Status: DC

## 2014-12-27 MED ORDER — OXYCODONE-ACETAMINOPHEN 10-325 MG PO TABS: 1.0000 | ORAL_TABLET | ORAL | Status: DC | PRN

## 2014-12-27 MED ORDER — FLUTICASONE-SALMETEROL 250-50 MCG/DOSE IN AEPB: 1.0000 | INHALATION_SPRAY | Freq: Two times a day (BID) | RESPIRATORY_TRACT | Status: DC

## 2014-12-27 NOTE — Discharge Summary (Signed)
Physician Discharge Summary  Patient ID: Cameron Villarreal MRN: [id number] DOB/AGE: 1965-10-30 49 y.o. Primary Care Physician:Floria Brandau L, MD Admit date: 12/25/2014 Discharge date: 12/27/2014    Discharge Diagnoses:   Active Problems:   CAP (community acquired pneumonia)   COPD exacerbation ([place])   COPD with acute exacerbation ([facility])  anxiety Chronic back pain Bilateral ear pain    Medication List    TAKE these medications        ADVAIR DISKUS 250-50 MCG/DOSE Aepb  Generic drug:  Fluticasone-Salmeterol  Inhale 1 puff into the lungs every 12 (twelve) hours.     Fluticasone-Salmeterol 250-50 MCG/DOSE Aepb  Commonly known as:  ADVAIR DISKUS  Inhale 1 puff into the lungs 2 (two) times daily.     albuterol (5 MG/ML) 0.5% nebulizer solution  Commonly known as:  PROVENTIL  Take 0.5 mLs (2.5 mg total) by nebulization every 4 (four) hours as needed for wheezing or shortness of breath.     ALPRAZolam 1 MG tablet  Commonly known as:  XANAX  Take 1 mg by mouth 3 (three) times daily.     amoxicillin-clavulanate 875-125 MG tablet  Commonly known as:  AUGMENTIN  Take 1 tablet by mouth 2 (two) times daily.     aspirin EC 81 MG tablet  Take 81 mg by mouth daily.     benzonatate 200 MG capsule  Commonly known as:  TESSALON  Take 1 capsule (200 mg total) by mouth 3 (three) times daily.     guaiFENesin 600 MG 12 hr tablet  Commonly known as:  MUCINEX  Take 2 tablets (1,200 mg total) by mouth 2 (two) times daily.     oxyCODONE-acetaminophen 10-325 MG tablet  Commonly known as:  PERCOCET  Take 1 tablet by mouth every 4 (four) hours as needed for pain.     predniSONE 10 MG tablet  Commonly known as:  DELTASONE  Take 1 tablet (10 mg total) by mouth daily with breakfast. 4x3 d,3x3d,2x3d,1x3d.     tiotropium 18 MCG inhalation capsule  Commonly known as:  SPIRIVA HANDIHALER  Place 1 capsule (18 mcg total) into inhaler and inhale every morning.     varenicline 1 MG tablet   Commonly known as:  CHANTIX CONTINUING MONTH PAK  Take 1 tablet (1 mg total) by mouth 2 (two) times daily.        Discharged Condition: Improved    Consults: None  Significant Diagnostic Studies: Dg Chest Port 1 View  12/25/2014  CLINICAL DATA:  Productive cough and shortness of breath for 4 days EXAM: PORTABLE CHEST - 1 VIEW COMPARISON:  08/23/2014 FINDINGS: Cardiac shadow is stable. The lungs are hyperinflated consistent with COPD. Mild interstitial changes are again noted bilaterally. The markings are slightly more prominent in the medial right lung base which may represent some early infiltrate. There is a somewhat rounded density identified in the lateral aspect of the right apex. This may represent a healing rib fracture. This was not present on the prior exam. IMPRESSION: Early right basilar infiltrate superimposed on COPD. Followup PA and lateral chest X-ray is recommended in 3-4 weeks following trial of antibiotic therapy to ensure resolution and exclude underlying malignancy. Vague somewhat rounded density in the right apex. This can also be followed on subsequent chest x-rays. Electronically Signed   By: Inez Catalina M.D.   On: 12/25/2014 13:40    Lab Results: Basic Metabolic Panel:  Recent Labs  12/25/14 1310 12/26/14 0635  NA 134* 138  K 4.5 4.2  CL 96* 102  CO2 28 29  GLUCOSE 147* 142*  BUN 13 12  CREATININE 0.83 0.53*  CALCIUM 9.6 9.0   Liver Function Tests:  Recent Labs  12/26/14 0635  AST 21  ALT 12*  ALKPHOS 82  BILITOT 0.5  PROT 7.0  ALBUMIN 3.1*     CBC:  Recent Labs  12/25/14 1310 12/26/14 0635 12/27/14 0513  WBC 42.0* 30.0* 39.0*  NEUTROABS 37.9*  --  36.1*  HGB 17.5* 14.9 13.5  HCT 53.1* 46.5 42.8  MCV 90.3 92.6 94.5  PLT 290 242 292    No results found for this or any previous visit (from the past 240 hour(s)).   Hospital Course: This is a 49 year old with known severe COPD who came to the emergency department with increasing  shortness of breath. He has had problems for about 48 hours. He ran out of some of his inhalers. In the emergency department he was noted to have pneumonia and he was started on treatment for that. His white blood count was very high and has remained high through the hospitalization although he was on steroids. He was fairly markedly improved after the first 18 hours and says he was back to baseline after approximately 50 hours. His chest is much clearer.  Discharge Exam: Blood pressure 114/80, pulse 97, temperature 96.9 F (36.1 C), temperature source Oral, resp. rate 20, height 5\' 8"  (1.727 m), weight 55 kg (121 lb 4.1 oz), SpO2 95 %. He is awake and alert. He appears to be back to baseline. His chest is much clearer. His heart is regular  Disposition: Home he does not want home health services. He will need to have laboratory work to document that his white blood count comes down and he needs a repeat chest x-ray to document clearing of the pneumonia these will be done as an outpatient      Discharge Instructions    Discharge patient    Complete by:  As directed              Signed: Kearie Mennen L   12/27/2014, 10:23 AM

## 2014-12-27 NOTE — Progress Notes (Signed)
Subjective: He says he feels great and wants to go home. His white blood cell count is elevated but he does look much better.  Objective: Vital signs in last 24 hours: Temp:  [96.9 F (36.1 C)-97.6 F (36.4 C)] 96.9 F (36.1 C) (11/19 0707) Pulse Rate:  [86-97] 97 (11/19 0707) Resp:  [20-21] 20 (11/19 0707) BP: (99-114)/(67-80) 114/80 mmHg (11/19 0707) SpO2:  [89 %-97 %] 95 % (11/19 0821) Weight change:  Last BM Date: 12/24/14  Intake/Output from previous day: 11/18 0701 - 11/19 0700 In: 2945 [P.O.:960; I.V.:1735; IV Piggyback:250] Out: 653 [Urine:653]  PHYSICAL EXAM General appearance: alert, cooperative and no distress Resp: rhonchi bilaterally Cardio: regular rate and rhythm, S1, S2 normal, no murmur, click, rub or gallop GI: soft, non-tender; bowel sounds normal; no masses,  no organomegaly Extremities: extremities normal, atraumatic, no cyanosis or edema  Lab Results:  Results for orders placed or performed during the hospital encounter of 12/25/14 (from the past 48 hour(s))  Basic metabolic panel     Status: Abnormal   Collection Time: 12/25/14  1:10 PM  Result Value Ref Range   Sodium 134 (L) 135 - 145 mmol/L   Potassium 4.5 3.5 - 5.1 mmol/L   Chloride 96 (L) 101 - 111 mmol/L   CO2 28 22 - 32 mmol/L   Glucose, Bld 147 (H) 65 - 99 mg/dL   BUN 13 6 - 20 mg/dL   Creatinine, Ser 0.83 0.61 - 1.24 mg/dL   Calcium 9.6 8.9 - 10.3 mg/dL   GFR calc non Af Amer >60 >60 mL/min   GFR calc Af Amer >60 >60 mL/min    Comment: (NOTE) The eGFR has been calculated using the CKD EPI equation. This calculation has not been validated in all clinical situations. eGFR's persistently <60 mL/min signify possible Chronic Kidney Disease.    Anion gap 10 5 - 15  Troponin I     Status: None   Collection Time: 12/25/14  1:10 PM  Result Value Ref Range   Troponin I <0.03 <0.031 ng/mL    Comment:        NO INDICATION OF MYOCARDIAL INJURY.   CBC with Differential     Status: Abnormal    Collection Time: 12/25/14  1:10 PM  Result Value Ref Range   WBC 42.0 (H) 4.0 - 10.5 K/uL   RBC 5.88 (H) 4.22 - 5.81 MIL/uL   Hemoglobin 17.5 (H) 13.0 - 17.0 g/dL   HCT 53.1 (H) 39.0 - 52.0 %   MCV 90.3 78.0 - 100.0 fL   MCH 29.8 26.0 - 34.0 pg   MCHC 33.0 30.0 - 36.0 g/dL   RDW 15.5 11.5 - 15.5 %   Platelets 290 150 - 400 K/uL    Comment: SPECIMEN CHECKED FOR CLOTS PLATELET COUNT CONFIRMED BY SMEAR LARGE PLATELETS PRESENT    Neutrophils Relative % 90 %   Neutro Abs 37.9 (H) 1.7 - 7.7 K/uL   Lymphocytes Relative 4 %   Lymphs Abs 1.5 0.7 - 4.0 K/uL   Monocytes Relative 6 %   Monocytes Absolute 2.6 (H) 0.1 - 1.0 K/uL   Eosinophils Relative 0 %   Eosinophils Absolute 0.0 0.0 - 0.7 K/uL   Basophils Relative 0 %   Basophils Absolute 0.0 0.0 - 0.1 K/uL   WBC Morphology WHITE COUNT CONFIRMED ON SMEAR   I-Stat CG4 Lactic Acid, ED     Status: Abnormal   Collection Time: 12/25/14  2:03 PM  Result Value Ref Range  Lactic Acid, Venous 3.09 (HH) 0.5 - 2.0 mmol/L   Comment NOTIFIED PHYSICIAN   Comprehensive metabolic panel     Status: Abnormal   Collection Time: 12/26/14  6:35 AM  Result Value Ref Range   Sodium 138 135 - 145 mmol/L   Potassium 4.2 3.5 - 5.1 mmol/L   Chloride 102 101 - 111 mmol/L   CO2 29 22 - 32 mmol/L   Glucose, Bld 142 (H) 65 - 99 mg/dL   BUN 12 6 - 20 mg/dL   Creatinine, Ser 0.53 (L) 0.61 - 1.24 mg/dL   Calcium 9.0 8.9 - 10.3 mg/dL   Total Protein 7.0 6.5 - 8.1 g/dL   Albumin 3.1 (L) 3.5 - 5.0 g/dL   AST 21 15 - 41 U/L   ALT 12 (L) 17 - 63 U/L   Alkaline Phosphatase 82 38 - 126 U/L   Total Bilirubin 0.5 0.3 - 1.2 mg/dL   GFR calc non Af Amer >60 >60 mL/min   GFR calc Af Amer >60 >60 mL/min    Comment: (NOTE) The eGFR has been calculated using the CKD EPI equation. This calculation has not been validated in all clinical situations. eGFR's persistently <60 mL/min signify possible Chronic Kidney Disease.    Anion gap 7 5 - 15  CBC     Status: Abnormal    Collection Time: 12/26/14  6:35 AM  Result Value Ref Range   WBC 30.0 (H) 4.0 - 10.5 K/uL   RBC 5.02 4.22 - 5.81 MIL/uL   Hemoglobin 14.9 13.0 - 17.0 g/dL   HCT 46.5 39.0 - 52.0 %   MCV 92.6 78.0 - 100.0 fL   MCH 29.7 26.0 - 34.0 pg   MCHC 32.0 30.0 - 36.0 g/dL   RDW 14.9 11.5 - 15.5 %   Platelets 242 150 - 400 K/uL  CBC with Differential/Platelet     Status: Abnormal   Collection Time: 12/27/14  5:13 AM  Result Value Ref Range   WBC 39.0 (H) 4.0 - 10.5 K/uL   RBC 4.53 4.22 - 5.81 MIL/uL   Hemoglobin 13.5 13.0 - 17.0 g/dL   HCT 42.8 39.0 - 52.0 %   MCV 94.5 78.0 - 100.0 fL   MCH 29.8 26.0 - 34.0 pg   MCHC 31.5 30.0 - 36.0 g/dL   RDW 14.8 11.5 - 15.5 %   Platelets 292 150 - 400 K/uL   Neutrophils Relative % 93 %   Neutro Abs 36.1 (H) 1.7 - 7.7 K/uL   Lymphocytes Relative 3 %   Lymphs Abs 1.3 0.7 - 4.0 K/uL   Monocytes Relative 4 %   Monocytes Absolute 1.5 (H) 0.1 - 1.0 K/uL   Eosinophils Relative 0 %   Eosinophils Absolute 0.0 0.0 - 0.7 K/uL   Basophils Relative 0 %   Basophils Absolute 0.0 0.0 - 0.1 K/uL    ABGS No results for input(s): PHART, PO2ART, TCO2, HCO3 in the last 72 hours.  Invalid input(s): PCO2 CULTURES No results found for this or any previous visit (from the past 240 hour(s)). Studies/Results: Dg Chest Port 1 View  12/25/2014  CLINICAL DATA:  Productive cough and shortness of breath for 4 days EXAM: PORTABLE CHEST - 1 VIEW COMPARISON:  08/23/2014 FINDINGS: Cardiac shadow is stable. The lungs are hyperinflated consistent with COPD. Mild interstitial changes are again noted bilaterally. The markings are slightly more prominent in the medial right lung base which may represent some early infiltrate. There is a somewhat rounded density identified  in the lateral aspect of the right apex. This may represent a healing rib fracture. This was not present on the prior exam. IMPRESSION: Early right basilar infiltrate superimposed on COPD. Followup PA and lateral  chest X-ray is recommended in 3-4 weeks following trial of antibiotic therapy to ensure resolution and exclude underlying malignancy. Vague somewhat rounded density in the right apex. This can also be followed on subsequent chest x-rays. Electronically Signed   By: Inez Catalina M.D.   On: 12/25/2014 13:40    Medications:  Prior to Admission:  Prescriptions prior to admission  Medication Sig Dispense Refill Last Dose  . ALPRAZolam (XANAX) 1 MG tablet Take 1 mg by mouth 3 (three) times daily.   12/25/2014 at Unknown time  . aspirin EC 81 MG tablet Take 81 mg by mouth daily.   12/25/2014 at Unknown time  . Fluticasone-Salmeterol (ADVAIR DISKUS) 250-50 MCG/DOSE AEPB Inhale 1 puff into the lungs every 12 (twelve) hours.     Past Week at Unknown time  . varenicline (CHANTIX CONTINUING MONTH PAK) 1 MG tablet Take 1 tablet (1 mg total) by mouth 2 (two) times daily. 60 tablet 2 12/24/2014 at Unknown time  . [DISCONTINUED] albuterol (PROVENTIL) (5 MG/ML) 0.5% nebulizer solution Take 0.5 mLs (2.5 mg total) by nebulization every 4 (four) hours as needed for wheezing or shortness of breath. 20 mL 12 12/25/2014 at Unknown time  . [DISCONTINUED] oxyCODONE-acetaminophen (PERCOCET) 10-325 MG tablet Take 1 tablet by mouth every 4 (four) hours as needed for pain.   12/25/2014 at Unknown time  . [DISCONTINUED] amoxicillin-clavulanate (AUGMENTIN) 875-125 MG per tablet Take 1 tablet by mouth 2 (two) times daily. (Patient not taking: Reported on 12/25/2014) 14 tablet 3   . [DISCONTINUED] predniSONE (DELTASONE) 10 MG tablet Take 1 tablet (10 mg total) by mouth daily with breakfast. 4x3 d,3x3d,2x3d,1x3d. (Patient not taking: Reported on 12/25/2014) 30 tablet 2    Scheduled: . ALPRAZolam  1 mg Oral TID  . aspirin EC  81 mg Oral Daily  . azithromycin  500 mg Intravenous Q24H  . benzonatate  200 mg Oral TID  . cefTRIAXone (ROCEPHIN)  IV  1 g Intravenous Q24H  . enoxaparin (LOVENOX) injection  40 mg Subcutaneous Q24H  .  guaiFENesin  1,200 mg Oral BID  . methylPREDNISolone (SOLU-MEDROL) injection  80 mg Intravenous Q12H  . mometasone-formoterol  2 puff Inhalation BID  . sodium chloride  3 mL Intravenous Q12H  . varenicline  1 mg Oral BID   Continuous: . sodium chloride 75 mL/hr (12/26/14 0630)   KOE:[id number], ondansetron **OR** ondansetron (ZOFRAN) IV, oxyCODONE-acetaminophen **AND** oxyCODONE  Assesment: He is admitted with community-acquired pneumonia. He had run out of his inhalers and I think that's part of his problem. He has COPD exacerbation and although his white blood cell count is elevated he is generally much better.  At baseline he has anxiety and chronic low back pain. He says his back pain is giving him a little more trouble.  He also complains of pain in his ear and has an appointment set up with an ENT physician Active Problems:   CAP (community acquired pneumonia)   COPD exacerbation ([place])   COPD with acute exacerbation ([place])    Plan: I think he is ready for discharge now. He needs to have CBC next week and he will come by my office for that.    LOS: 2 days   Toni Demo L 12/27/2014, 10:21 AM

## 2014-12-27 NOTE — Progress Notes (Signed)
Reviewed discharge instructions with patient. Instructed on medications and when next doses are due. Reviewed signs and symptoms to report and to follow up with Dr.Hawkins in his office. Understanding voiced. Patient escorted to home with family.

## 2015-01-29 ENCOUNTER — Ambulatory Visit (INDEPENDENT_AMBULATORY_CARE_PROVIDER_SITE_OTHER): Payer: Medicaid Other | Admitting: Otolaryngology

## 2015-01-29 DIAGNOSIS — H9012 Conductive hearing loss, unilateral, left ear, with unrestricted hearing on the contralateral side: Secondary | ICD-10-CM

## 2015-01-29 DIAGNOSIS — H6122 Impacted cerumen, left ear: Secondary | ICD-10-CM

## 2015-02-04 ENCOUNTER — Inpatient Hospital Stay (HOSPITAL_COMMUNITY)
Admission: EM | Admit: 2015-02-04 | Discharge: 2015-02-06 | DRG: 190 | Disposition: A | Discharge status: Home / Self Care | Payer: Medicaid Other | Attending: Pulmonary Disease | Admitting: Pulmonary Disease

## 2015-02-04 ENCOUNTER — Emergency Department (HOSPITAL_COMMUNITY): Payer: Medicaid Other

## 2015-02-04 ENCOUNTER — Encounter (HOSPITAL_COMMUNITY): Payer: Self-pay | Admitting: Emergency Medicine

## 2015-02-04 DIAGNOSIS — F17210 Nicotine dependence, cigarettes, uncomplicated: Secondary | ICD-10-CM | POA: Diagnosis present

## 2015-02-04 DIAGNOSIS — Z7982 Long term (current) use of aspirin: Secondary | ICD-10-CM | POA: Diagnosis not present

## 2015-02-04 DIAGNOSIS — J9621 Acute and chronic respiratory failure with hypoxia: Secondary | ICD-10-CM

## 2015-02-04 DIAGNOSIS — J9620 Acute and chronic respiratory failure, unspecified whether with hypoxia or hypercapnia: Secondary | ICD-10-CM | POA: Diagnosis present

## 2015-02-04 DIAGNOSIS — R911 Solitary pulmonary nodule: Secondary | ICD-10-CM | POA: Diagnosis present

## 2015-02-04 DIAGNOSIS — K219 Gastro-esophageal reflux disease without esophagitis: Secondary | ICD-10-CM | POA: Diagnosis present

## 2015-02-04 DIAGNOSIS — J441 Chronic obstructive pulmonary disease with (acute) exacerbation: Secondary | ICD-10-CM | POA: Diagnosis present

## 2015-02-04 DIAGNOSIS — D72829 Elevated white blood cell count, unspecified: Secondary | ICD-10-CM | POA: Diagnosis present

## 2015-02-04 DIAGNOSIS — Z800 Family history of malignant neoplasm of digestive organs: Secondary | ICD-10-CM | POA: Diagnosis not present

## 2015-02-04 DIAGNOSIS — T380X5A Adverse effect of glucocorticoids and synthetic analogues, initial encounter: Secondary | ICD-10-CM | POA: Diagnosis present

## 2015-02-04 DIAGNOSIS — Z9981 Dependence on supplemental oxygen: Secondary | ICD-10-CM

## 2015-02-04 DIAGNOSIS — E7800 Pure hypercholesterolemia, unspecified: Secondary | ICD-10-CM | POA: Diagnosis present

## 2015-02-04 DIAGNOSIS — R0602 Shortness of breath: Secondary | ICD-10-CM | POA: Diagnosis present

## 2015-02-04 DIAGNOSIS — E440 Moderate protein-calorie malnutrition: Secondary | ICD-10-CM | POA: Diagnosis present

## 2015-02-04 LAB — BASIC METABOLIC PANEL
ANION GAP: 9 (ref 5–15)
BUN: 11 mg/dL (ref 6–20)
CO2: 28 mmol/L (ref 22–32)
Calcium: 9.5 mg/dL (ref 8.9–10.3)
Chloride: 99 mmol/L — ABNORMAL LOW (ref 101–111)
Creatinine, Ser: 0.71 mg/dL (ref 0.61–1.24)
GFR calc Af Amer: >60 mL/min (ref >60)
GFR calc non Af Amer: >60 mL/min (ref >60)
GLUCOSE: 128 mg/dL — AB (ref 65–99)
POTASSIUM: 4.3 mmol/L (ref 3.5–5.1)
Sodium: 136 mmol/L (ref 135–145)

## 2015-02-04 LAB — BLOOD GAS, ARTERIAL
ACID-BASE DEFICIT: 0.1 mmol/L (ref 0.0–2.0)
ACID-BASE EXCESS: 1 mmol/L (ref 0.0–2.0)
BICARBONATE: 24.5 meq/L — AB (ref 20.0–24.0)
Bicarbonate: 25.8 mEq/L — ABNORMAL HIGH (ref 20.0–24.0)
DRAWN BY: 234301
Delivery systems: POSITIVE
Drawn by: 23534
Expiratory PAP: 6
FIO2: 0.4
INSPIRATORY PAP: 12
Mode: POSITIVE
O2 Content: 2 L/min
O2 Content: 40 L/min
O2 SAT: 96.4 %
O2 SAT: 98.6 %
PCO2 ART: 38.2 mmHg (ref 35.0–45.0)
pCO2 arterial: 35.3 mmHg (ref 35.0–45.0)
pH, Arterial: 7.457 — ABNORMAL HIGH (ref 7.350–7.450)
pH, Arterial: 7.413 (ref 7.350–7.450)
pO2, Arterial: 79.4 mmHg — ABNORMAL LOW (ref 80.0–100.0)
pO2, Arterial: 129 mmHg — ABNORMAL HIGH (ref 80.0–100.0)

## 2015-02-04 LAB — CBC
HEMATOCRIT: 52.4 % — AB (ref 39.0–52.0)
Hemoglobin: 17.1 g/dL — ABNORMAL HIGH (ref 13.0–17.0)
MCH: 30.5 pg (ref 26.0–34.0)
MCHC: 32.6 g/dL (ref 30.0–36.0)
MCV: 93.6 fL (ref 78.0–100.0)
Platelets: 315 10*3/uL (ref 150–400)
RBC: 5.6 MIL/uL (ref 4.22–5.81)
RDW: 15.8 % — ABNORMAL HIGH (ref 11.5–15.5)
WBC: 30.8 10*3/uL — AB (ref 4.0–10.5)

## 2015-02-04 LAB — TROPONIN I

## 2015-02-04 LAB — D-DIMER, QUANTITATIVE (NOT AT ARMC)

## 2015-02-04 LAB — MRSA PCR SCREENING: MRSA by PCR: POSITIVE — AB

## 2015-02-04 MED ORDER — ALPRAZOLAM 1 MG PO TABS
1.0000 mg | ORAL_TABLET | Freq: Three times a day (TID) | ORAL | Status: DC
  Administered 2015-02-04 – 2015-02-06 (×6): 1 mg via ORAL
  Filled 2015-02-04: qty 4
  Filled 2015-02-04: qty 1
  Filled 2015-02-04: qty 4
  Filled 2015-02-04 (×2): qty 1
  Filled 2015-02-04: qty 4

## 2015-02-04 MED ORDER — ENOXAPARIN SODIUM 40 MG/0.4ML ~~LOC~~ SOLN
40.0000 mg | SUBCUTANEOUS | Status: DC
  Administered 2015-02-04 – 2015-02-05 (×2): 40 mg via SUBCUTANEOUS
  Filled 2015-02-04 (×2): qty 0.4

## 2015-02-04 MED ORDER — LEVOFLOXACIN IN D5W 500 MG/100ML IV SOLN
500.0000 mg | Freq: Once | INTRAVENOUS | Status: AC
  Administered 2015-02-04: 500 mg via INTRAVENOUS
  Filled 2015-02-04: qty 100

## 2015-02-04 MED ORDER — ALBUTEROL (5 MG/ML) CONTINUOUS INHALATION SOLN
10.0000 mg/h | INHALATION_SOLUTION | Freq: Once | RESPIRATORY_TRACT | Status: AC
  Administered 2015-02-04: 10 mg/h via RESPIRATORY_TRACT
  Filled 2015-02-04: qty 20

## 2015-02-04 MED ORDER — SODIUM CHLORIDE 0.9 % IV SOLN
INTRAVENOUS | Status: DC
  Administered 2015-02-04 – 2015-02-05 (×3): via INTRAVENOUS

## 2015-02-04 MED ORDER — MOMETASONE FURO-FORMOTEROL FUM 100-5 MCG/ACT IN AERO
2.0000 | INHALATION_SPRAY | Freq: Two times a day (BID) | RESPIRATORY_TRACT | Status: DC
  Administered 2015-02-04 – 2015-02-06 (×4): 2 via RESPIRATORY_TRACT
  Filled 2015-02-04: qty 8.8

## 2015-02-04 MED ORDER — ASPIRIN EC 81 MG PO TBEC
81.0000 mg | DELAYED_RELEASE_TABLET | Freq: Every day | ORAL | Status: DC
  Administered 2015-02-04 – 2015-02-06 (×3): 81 mg via ORAL
  Filled 2015-02-04 (×3): qty 1

## 2015-02-04 MED ORDER — VARENICLINE TARTRATE 1 MG PO TABS
1.0000 mg | ORAL_TABLET | Freq: Two times a day (BID) | ORAL | Status: DC
  Administered 2015-02-04 – 2015-02-06 (×4): 1 mg via ORAL
  Filled 2015-02-04 (×4): qty 1

## 2015-02-04 MED ORDER — OXYCODONE-ACETAMINOPHEN 10-325 MG PO TABS: 1.0000 | ORAL_TABLET | ORAL | Status: DC | PRN

## 2015-02-04 MED ORDER — ACETAMINOPHEN 325 MG PO TABS: 650.0000 mg | ORAL_TABLET | Freq: Four times a day (QID) | ORAL | Status: DC | PRN

## 2015-02-04 MED ORDER — ALBUTEROL (5 MG/ML) CONTINUOUS INHALATION SOLN
10.0000 mg/h | INHALATION_SOLUTION | Freq: Once | RESPIRATORY_TRACT | Status: AC
  Administered 2015-02-04: 10 mg/h via RESPIRATORY_TRACT

## 2015-02-04 MED ORDER — METHYLPREDNISOLONE SODIUM SUCC 125 MG IJ SOLR
80.0000 mg | Freq: Four times a day (QID) | INTRAMUSCULAR | Status: DC
  Administered 2015-02-04 – 2015-02-06 (×8): 80 mg via INTRAVENOUS
  Filled 2015-02-04 (×8): qty 2

## 2015-02-04 MED ORDER — MUPIROCIN 2 % EX OINT
1.0000 "application " | TOPICAL_OINTMENT | Freq: Two times a day (BID) | CUTANEOUS | Status: DC
  Administered 2015-02-04 – 2015-02-05 (×2): 1 via NASAL
  Filled 2015-02-04: qty 22

## 2015-02-04 MED ORDER — IPRATROPIUM BROMIDE 0.02 % IN SOLN
0.5000 mg | Freq: Four times a day (QID) | RESPIRATORY_TRACT | Status: DC
Start: 2015-02-04 — End: 2015-02-04

## 2015-02-04 MED ORDER — ALBUTEROL SULFATE (2.5 MG/3ML) 0.083% IN NEBU: 2.5000 mg | INHALATION_SOLUTION | Freq: Four times a day (QID) | RESPIRATORY_TRACT | Status: DC

## 2015-02-04 MED ORDER — ONDANSETRON HCL 4 MG/2ML IJ SOLN: 4.0000 mg | Freq: Four times a day (QID) | INTRAMUSCULAR | Status: DC | PRN

## 2015-02-04 MED ORDER — IPRATROPIUM-ALBUTEROL 0.5-2.5 (3) MG/3ML IN SOLN
3.0000 mL | Freq: Four times a day (QID) | RESPIRATORY_TRACT | Status: DC
  Administered 2015-02-04 – 2015-02-06 (×7): 3 mL via RESPIRATORY_TRACT
  Filled 2015-02-04 (×6): qty 3

## 2015-02-04 MED ORDER — ALBUTEROL SULFATE (2.5 MG/3ML) 0.083% IN NEBU: 2.5000 mg | INHALATION_SOLUTION | RESPIRATORY_TRACT | Status: DC | PRN

## 2015-02-04 MED ORDER — MAGNESIUM SULFATE 2 GM/50ML IV SOLN
2.0000 g | Freq: Once | INTRAVENOUS | Status: AC
  Administered 2015-02-04: 2 g via INTRAVENOUS
  Filled 2015-02-04: qty 50

## 2015-02-04 MED ORDER — LEVOFLOXACIN 750 MG PO TABS
750.0000 mg | ORAL_TABLET | Freq: Every day | ORAL | Status: DC
  Administered 2015-02-05 – 2015-02-06 (×2): 750 mg via ORAL
  Filled 2015-02-04 (×2): qty 1

## 2015-02-04 MED ORDER — ACETAMINOPHEN 650 MG RE SUPP: 650.0000 mg | Freq: Four times a day (QID) | RECTAL | Status: DC | PRN

## 2015-02-04 MED ORDER — CHLORHEXIDINE GLUCONATE CLOTH 2 % EX PADS
6.0000 | MEDICATED_PAD | Freq: Every day | CUTANEOUS | Status: DC
  Administered 2015-02-05: 6 via TOPICAL

## 2015-02-04 MED ORDER — OXYCODONE-ACETAMINOPHEN 5-325 MG PO TABS
1.0000 | ORAL_TABLET | ORAL | Status: DC | PRN
Start: 2015-02-04 — End: 2015-02-06
  Administered 2015-02-04 – 2015-02-06 (×9): 1 via ORAL
  Filled 2015-02-04 (×9): qty 1

## 2015-02-04 MED ORDER — SENNOSIDES-DOCUSATE SODIUM 8.6-50 MG PO TABS: 1.0000 | ORAL_TABLET | Freq: Every evening | ORAL | Status: DC | PRN

## 2015-02-04 MED ORDER — OXYCODONE HCL 5 MG PO TABS
5.0000 mg | ORAL_TABLET | ORAL | Status: DC | PRN
  Administered 2015-02-04 – 2015-02-06 (×8): 5 mg via ORAL
  Filled 2015-02-04 (×8): qty 1

## 2015-02-04 MED ORDER — SODIUM CHLORIDE 0.9 % IV SOLN: Freq: Once | INTRAVENOUS | Status: DC

## 2015-02-04 MED ORDER — MORPHINE SULFATE (PF) 4 MG/ML IV SOLN
4.0000 mg | Freq: Once | INTRAVENOUS | Status: AC
  Administered 2015-02-04: 4 mg via INTRAVENOUS
  Filled 2015-02-04: qty 1

## 2015-02-04 MED ORDER — ONDANSETRON HCL 4 MG PO TABS: 4.0000 mg | ORAL_TABLET | Freq: Four times a day (QID) | ORAL | Status: DC | PRN

## 2015-02-04 MED ORDER — METHYLPREDNISOLONE SODIUM SUCC 125 MG IJ SOLR
125.0000 mg | Freq: Once | INTRAMUSCULAR | Status: AC
  Administered 2015-02-04: 125 mg via INTRAVENOUS
  Filled 2015-02-04: qty 2

## 2015-02-04 MED ORDER — IPRATROPIUM BROMIDE 0.02 % IN SOLN
0.5000 mg | Freq: Once | RESPIRATORY_TRACT | Status: AC
  Administered 2015-02-04: 0.5 mg via RESPIRATORY_TRACT
  Filled 2015-02-04: qty 2.5

## 2015-02-04 NOTE — H&P (Signed)
[facility]          History and Physical    PCP:   Alonza Bogus, MD   EDP: Ezequiel Essex, MD  Chief Complaint:  Shortness of breath  HPI: Patient is a pleasant 49 year old man with history of long-standing tobacco abuse, COPD who presents to the hospital today with a three-day history of shortness of breath. Shortness of breath came to the point today where he was unable to do even his basic ADLs and he presented to the hospital for evaluation. He was found to be tachypneic and tachycardic and decision was made to place him on BiPAP for his increased work of breathing. He was given nebulizer treatments as well as Solu-Medrol and has improved by the time I am seeing him this afternoon in the ICU. He denies any fevers, chills. Chest x-ray is negative for acute infection.  Allergies:   Allergies  Allergen Reactions  . Codeine Itching and Nausea Only      Past Medical History  Diagnosis Date  . COPD (chronic obstructive pulmonary disease) (HCC)     Severe centrilobular emphysema, on home o2  . Hypercholesterolemia   . S/P endoscopy March 2012    Reflux esophagitis, no ulcerations  . S/P colonoscopy March 2012    Normal  . Pulmonary nodule 07/04/2011  . GERD (gastroesophageal reflux disease)     Past Surgical History  Procedure Laterality Date  . Cholecystectomy    . Appendectomy    . Right inguinal hernia repair    . Hernia repair    . Knee surgery      Prior to Admission medications   Medication Sig Start Date End Date Taking? Authorizing Provider  albuterol (PROVENTIL) (5 MG/ML) 0.5% nebulizer solution Take 0.5 mLs (2.5 mg total) by nebulization every 4 (four) hours as needed for wheezing or shortness of breath. 12/27/14  Yes Sinda Du, MD  ALPRAZolam Duanne Moron) 1 MG tablet Take 1 mg by mouth 3 (three) times daily.   Yes Historical Provider, MD  aspirin EC 81 MG tablet Take 81 mg by mouth daily.   Yes Historical Provider, MD    Fluticasone-Salmeterol (ADVAIR DISKUS) 250-50 MCG/DOSE AEPB Inhale 1 puff into the lungs 2 (two) times daily. 12/27/14  Yes Sinda Du, MD  oxyCODONE-acetaminophen (PERCOCET) 10-325 MG tablet Take 1 tablet by mouth every 4 (four) hours as needed for pain. 12/27/14  Yes Sinda Du, MD  predniSONE (DELTASONE) 10 MG tablet Take 1 tablet (10 mg total) by mouth daily with breakfast. 4x3 d,3x3d,2x3d,1x3d. 12/27/14  Yes Sinda Du, MD  tiotropium (SPIRIVA HANDIHALER) 18 MCG inhalation capsule Place 1 capsule (18 mcg total) into inhaler and inhale every morning. 12/27/14  Yes Sinda Du, MD  varenicline (CHANTIX CONTINUING MONTH PAK) 1 MG tablet Take 1 tablet (1 mg total) by mouth 2 (two) times daily. 08/24/14  Yes Sinda Du, MD  amoxicillin-clavulanate (AUGMENTIN) 875-125 MG tablet Take 1 tablet by mouth 2 (two) times daily. Patient not taking: Reported on 02/04/2015 12/27/14   Sinda Du, MD  benzonatate (TESSALON) 200 MG capsule Take 1 capsule (200 mg total) by mouth 3 (three) times daily. Patient not taking: Reported on 02/04/2015 12/27/14   Sinda Du, MD  guaiFENesin (MUCINEX) 600 MG 12 hr tablet Take 2 tablets (1,200 mg total) by mouth 2 (two) times daily. Patient not taking: Reported on 02/04/2015 12/27/14   Sinda Du, MD    Social History:  reports that he has been smoking Cigarettes.  He has a 15 pack-year smoking history. He has never used smokeless tobacco. He reports that he drinks about 0.6 oz of alcohol per week. He reports that he does not use illicit drugs.  Family History  Problem Relation Age of Onset  . Cirrhosis Father     Deceased, ETOH cirrhosis  . Stomach cancer      Aunt    Review of Systems:  Constitutional: Denies fever, chills, diaphoresis, appetite change and fatigue.  HEENT: Denies photophobia, eye pain, redness, hearing loss, ear pain, congestion, sore throat, rhinorrhea, sneezing, mouth sores, trouble swallowing, neck pain, neck  stiffness and tinnitus.   Respiratory: Denies SOB, DOE, cough, chest tightness,  and wheezing.   Cardiovascular: Denies chest pain, palpitations and leg swelling.  Gastrointestinal: Denies nausea, vomiting, abdominal pain, diarrhea, constipation, blood in stool and abdominal distention.  Genitourinary: Denies dysuria, urgency, frequency, hematuria, flank pain and difficulty urinating.  Endocrine: Denies: hot or cold intolerance, sweats, changes in hair or nails, polyuria, polydipsia. Musculoskeletal: Denies myalgias, back pain, joint swelling, arthralgias and gait problem.  Skin: Denies pallor, rash and wound.  Neurological: Denies dizziness, seizures, syncope, weakness, light-headedness, numbness and headaches.  Hematological: Denies adenopathy. Easy bruising, personal or family bleeding history  Psychiatric/Behavioral: Denies suicidal ideation, mood changes, confusion, nervousness, sleep disturbance and agitation   Physical Exam: Blood pressure 126/100, pulse 112, temperature 98.6 F (37 C), temperature source Oral, resp. rate 23, height _0  (1.727 m), weight 58.9 kg (129 lb 13.6 oz), SpO2 100 %. General: Alert, awake, oriented 3, currently on BiPAP, decreased work of breathing HEENT: Normocephalic, atraumatic, pupils equal and reactive to light, Shockley movements intact Neck: Supple, no JVD, no lymphadenopathy, no bruits, no goiter and sent cardiovascular: Tachycardic, regular, no murmurs, rubs or gallops Lungs: Decreased breath sounds bilaterally, no wheezing Abdomen: Soft, nontender, nondistended, positive bowel sounds, no masses or organomegaly noted Extremities: No clubbing, cyanosis or edema, positive pulses Neurologic: Grossly intact and nonfocal  Labs on Admission:  Results for orders placed or performed during the hospital encounter of 02/04/15 (from the past 48 hour(s))  CBC     Status: Abnormal   Collection Time: 02/04/15  9:53 AM  Result Value Ref Range   WBC 30.8 (H)  4.0 - 10.5 K/uL   RBC 5.60 4.22 - 5.81 MIL/uL   Hemoglobin 17.1 (H) 13.0 - 17.0 g/dL   HCT 52.4 (H) 39.0 - 52.0 %   MCV 93.6 78.0 - 100.0 fL   MCH 30.5 26.0 - 34.0 pg   MCHC 32.6 30.0 - 36.0 g/dL   RDW 15.8 (H) 11.5 - 15.5 %   Platelets 315 150 - 400 K/uL  Basic metabolic panel     Status: Abnormal   Collection Time: 02/04/15  9:53 AM  Result Value Ref Range   Sodium 136 135 - 145 mmol/L   Potassium 4.3 3.5 - 5.1 mmol/L   Chloride 99 (L) 101 - 111 mmol/L   CO2 28 22 - 32 mmol/L   Glucose, Bld 128 (H) 65 - 99 mg/dL   BUN 11 6 - 20 mg/dL   Creatinine, Ser 0.71 0.61 - 1.24 mg/dL   Calcium 9.5 8.9 - 10.3 mg/dL   GFR calc non Af Amer >60 >60 mL/min   GFR calc Af Amer >60 >60 mL/min    Comment: (NOTE) The eGFR has been calculated using the CKD EPI equation. This calculation has not been validated in all clinical situations. eGFR's persistently <  60 mL/min signify possible Chronic Kidney Disease.    Anion gap 9 5 - 15  Troponin I     Status: None   Collection Time: 02/04/15  9:55 AM  Result Value Ref Range   Troponin I <0.03 <0.031 ng/mL    Comment:        NO INDICATION OF MYOCARDIAL INJURY.   D-dimer, quantitative (not at [facility])     Status: None   Collection Time: 02/04/15  9:55 AM  Result Value Ref Range   D-Dimer, Quant <0.27 0.00 - 0.50 ug/mL-FEU    Comment: (NOTE) At the manufacturer cut-off of 0.50 ug/mL FEU, this assay has been documented to exclude PE with a sensitivity and negative predictive value of 97 to 99%.  At this time, this assay has not been approved by the FDA to exclude DVT/VTE. Results should be correlated with clinical presentation.   Blood gas, arterial ([facility] & [facility] ONLY)     Status: Abnormal   Collection Time: 02/04/15 10:20 AM  Result Value Ref Range   O2 Content 2.0 L/min   Delivery systems NASAL CANNULA    pH, Arterial 7.457 (H) 7.350 - 7.450   pCO2 arterial 35.3 35.0 - 45.0 mmHg   pO2, Arterial 79.4 (L) 80.0 - 100.0 mmHg   Bicarbonate 25.8 (H)  20.0 - 24.0 mEq/L   Acid-Base Excess 1.0 0.0 - 2.0 mmol/L   O2 Saturation 96.4 %   Collection site RIGHT RADIAL    Drawn by [id number]    Sample type ARTERIAL    [place]ens test (pass/fail) PASS PASS    Radiological Exams on Admission: Dg Chest Portable 1 View  02/04/2015  CLINICAL DATA:  Increased shortness of breath over couple days, smoker, COPD, prior pulmonary nodule, GERD EXAM: PORTABLE CHEST 1 VIEW COMPARISON:  Portable exam 1002 hours compared to 12/25/2014 FINDINGS: Normal heart size and mediastinal contours. Enlarged central pulmonary arteries. Emphysematous and minimal bronchitic changes consistent with COPD. No definite acute infiltrate, pleural effusion, or pneumothorax. Questionable nodular density at the lateral RIGHT upper lobe, superimposed with the anterior RIGHT third rib, question pulmonary nodule versus healing fracture ; no definite healing fracture identified on a CT exam from 2015. Bones unremarkable. IMPRESSION: COPD changes with question pulmonary arterial hypertension. No definite acute infiltrate. Questionable nodular density RIGHT upper lobe versus healing fracture of the anterior RIGHT third rib; CT chest recommended without contrast to exclude pulmonary nodule. Electronically Signed   By: Lavonia Dana M.D.   On: 02/04/2015 10:26    Assessment/Plan Principal Problem:   Acute on chronic respiratory failure (HCC) Active Problems:   COPD exacerbation (HCC)   Malnutrition of moderate degree (HCC)   Leukocytosis     Acute on chronic respiratory failure -Secondary to COPD with acute exacerbation. -Continue BiPAP and wean as tolerated.  COPD with acute exacerbation -We'll continue Advair. -We'll place on steroids, nebs. -Wean BiPAP as tolerated.  -no indication for acute infection, however given severity of illness will placed on Levaquin.  Leukocytosis  -Likely related to steroid use, no indication for acute infection  DVT prophylaxis -Lovenox  CODE  STATUS -Full code  Dr. Luan Pulling will assume care in a.m.  Time Spent on Admission: 95 minutes  HERNANDEZ ACOSTA,Alissah Redmon [facility] Pager: [phone] 02/04/2015, 4:19 PM

## 2015-02-04 NOTE — ED Notes (Signed)
History of COPD on O2 @2l /m at home.  Having increasing SOB since yesterday.  C/o lower back pain.  Rates pain 10/10.  Placed on O2@2l /m [place], Sats 97%. Labored breathing noted.

## 2015-02-04 NOTE — ED Provider Notes (Signed)
CSN: [id number]     Arrival date & time 02/04/15  [id number] History  By signing my name below, I, Eustaquio Maize, attest that this documentation has been prepared under the direction and in the presence of Ezequiel Essex, MD. Electronically Signed: Eustaquio Maize, ED Scribe. 02/04/2015. 10:00 AM.   Chief Complaint  Patient presents with  . Shortness of Breath  . Back Pain    lower   . Cough    yellowish   The history is provided by the patient. No language interpreter was used.     HPI Comments: Cameron Villarreal is a 49 y.o. male with hx COPD (on 2L home O2) who presents to the Emergency Department complaining of gradual onset, constant, shortness of breath that began last night. He has not had to increase his oxygen at home since onset. Pt also complains of productive cough with yellow sputum, fever, and mid back pain. Pt took Aspirin last night for the fever with relief. His temperature in the ED is 98.6. He mentions that he has had similar back pain in the past with coughing. Pt also reports having sudden onset, sub sternal chest pain last night that lasted a few seconds before subsiding. Pt usually uses an albuterol inhaler and a nebulizer machine as needed but has been out for the past 4 days. He is currently on Prednisone. Denies chills, abdominal pain, swelling in lower extremities, or any other associated symptoms. Pt is current every day smoker who smokes approximately 5 cigarettes per day. No hx DVT/PE.  Past Medical History  Diagnosis Date  . COPD (chronic obstructive pulmonary disease) (HCC)     Severe centrilobular emphysema, on home o2  . Hypercholesterolemia   . S/P endoscopy March 2012    Reflux esophagitis, no ulcerations  . S/P colonoscopy March 2012    Normal  . Pulmonary nodule 07/04/2011  . GERD (gastroesophageal reflux disease)    Past Surgical History  Procedure Laterality Date  . Cholecystectomy    . Appendectomy    . Right inguinal hernia repair    . Hernia repair     . Knee surgery     Family History  Problem Relation Age of Onset  . Cirrhosis Father     Deceased, ETOH cirrhosis  . Stomach cancer      Aunt   Social History  Substance Use Topics  . Smoking status: Current Every Day Smoker -- 0.50 packs/day for 30 years    Types: Cigarettes  . Smokeless tobacco: Never Used  . Alcohol Use: 0.6 oz/week    1 Standard drinks or equivalent per week     Comment: Occasional    Review of Systems  Constitutional: Positive for fever. Negative for chills.  Respiratory: Positive for cough and shortness of breath.   Cardiovascular: Positive for chest pain. Negative for leg swelling.  Musculoskeletal: Positive for back pain.  A complete 10 system review of systems was obtained and all systems are negative except as noted in the HPI and PMH.    Allergies  Codeine  Home Medications   Prior to Admission medications   Medication Sig Start Date End Date Taking? Authorizing Provider  albuterol (PROVENTIL) (5 MG/ML) 0.5% nebulizer solution Take 0.5 mLs (2.5 mg total) by nebulization every 4 (four) hours as needed for wheezing or shortness of breath. 12/27/14  Yes Sinda Du, MD  ALPRAZolam Duanne Moron) 1 MG tablet Take 1 mg by mouth 3 (three) times daily.   Yes Historical Provider, MD  aspirin EC  81 MG tablet Take 81 mg by mouth daily.   Yes Historical Provider, MD  Fluticasone-Salmeterol (ADVAIR DISKUS) 250-50 MCG/DOSE AEPB Inhale 1 puff into the lungs 2 (two) times daily. 12/27/14  Yes Sinda Du, MD  oxyCODONE-acetaminophen (PERCOCET) 10-325 MG tablet Take 1 tablet by mouth every 4 (four) hours as needed for pain. 12/27/14  Yes Sinda Du, MD  predniSONE (DELTASONE) 10 MG tablet Take 1 tablet (10 mg total) by mouth daily with breakfast. 4x3 d,3x3d,2x3d,1x3d. 12/27/14  Yes Sinda Du, MD  tiotropium (SPIRIVA HANDIHALER) 18 MCG inhalation capsule Place 1 capsule (18 mcg total) into inhaler and inhale every morning. 12/27/14  Yes Sinda Du, MD   varenicline (CHANTIX CONTINUING MONTH PAK) 1 MG tablet Take 1 tablet (1 mg total) by mouth 2 (two) times daily. 08/24/14  Yes Sinda Du, MD  amoxicillin-clavulanate (AUGMENTIN) 875-125 MG tablet Take 1 tablet by mouth 2 (two) times daily. Patient not taking: Reported on 02/04/2015 12/27/14   Sinda Du, MD  benzonatate (TESSALON) 200 MG capsule Take 1 capsule (200 mg total) by mouth 3 (three) times daily. Patient not taking: Reported on 02/04/2015 12/27/14   Sinda Du, MD  guaiFENesin (MUCINEX) 600 MG 12 hr tablet Take 2 tablets (1,200 mg total) by mouth 2 (two) times daily. Patient not taking: Reported on 02/04/2015 12/27/14   Sinda Du, MD   Triage Vitals: BP 141/98 mmHg  Pulse 133  Temp(Src) 98.6 F (37 C) (Oral)  Resp 32  Ht 5\' 8"  (1.727 m)  Wt 135 lb (61.236 kg)  BMI 20.53 kg/m2  SpO2 96%   Physical Exam  Constitutional: He is oriented to person, place, and time. He appears well-developed and well-nourished. No distress.  HENT:  Head: Normocephalic and atraumatic.  Mouth/Throat: Oropharynx is clear and moist. No oropharyngeal exudate.  Eyes: Conjunctivae and EOM are normal. Pupils are equal, round, and reactive to light.  Neck: Normal range of motion. Neck supple.  No meningismus.  Cardiovascular: Regular rhythm, normal heart sounds and intact distal pulses.  Tachycardia present.   No murmur heard. Tachycardic to 130's.   Pulmonary/Chest: He is in respiratory distress. He has wheezes.  Moderate respiratory distress. Speaking in short phrases.   Very poor air exchange with faint wheezing.    Abdominal: Soft. There is no tenderness. There is no rebound and no guarding.  Musculoskeletal: Normal range of motion. He exhibits tenderness. He exhibits no edema.  Thoracic paraspinal tenderness Equal radial pulses and grip strength  Neurological: He is alert and oriented to person, place, and time. No cranial nerve deficit. He exhibits normal muscle tone.  Coordination normal.  No ataxia on finger to nose bilaterally. No pronator drift. 5/5 strength throughout. CN 2-12 intact.Equal grip strength. Sensation intact.   Skin: Skin is warm.  Psychiatric: He has a normal mood and affect. His behavior is normal.  Nursing note and vitals reviewed.   ED Course  Procedures (including critical care time)  DIAGNOSTIC STUDIES: Oxygen Saturation is 96% on [place], normal by my interpretation.    COORDINATION OF CARE: 9:57 AM-Discussed treatment plan which includes breathing treatment, CXR, Troponin, D-dimer, ABG, CBC, and BMP with pt at bedside and pt agreed to plan.   Labs Review Labs Reviewed  CBC - Abnormal; Notable for the following:    WBC 30.8 (*)    Hemoglobin 17.1 (*)    HCT 52.4 (*)    RDW 15.8 (*)    All other components within normal limits  BASIC METABOLIC PANEL - Abnormal; Notable  for the following:    Chloride 99 (*)    Glucose, Bld 128 (*)    All other components within normal limits  BLOOD GAS, ARTERIAL - Abnormal; Notable for the following:    pH, Arterial 7.457 (*)    pO2, Arterial 79.4 (*)    Bicarbonate 25.8 (*)    All other components within normal limits  BLOOD GAS, ARTERIAL - Abnormal; Notable for the following:    pO2, Arterial 129 (*)    Bicarbonate 24.5 (*)    All other components within normal limits  TROPONIN I  D-DIMER, QUANTITATIVE (NOT AT [facility])  BASIC METABOLIC PANEL  CBC    Imaging Review Dg Chest Portable 1 View  02/04/2015  CLINICAL DATA:  Increased shortness of breath over couple days, smoker, COPD, prior pulmonary nodule, GERD EXAM: PORTABLE CHEST 1 VIEW COMPARISON:  Portable exam 1002 hours compared to 12/25/2014 FINDINGS: Normal heart size and mediastinal contours. Enlarged central pulmonary arteries. Emphysematous and minimal bronchitic changes consistent with COPD. No definite acute infiltrate, pleural effusion, or pneumothorax. Questionable nodular density at the lateral RIGHT upper lobe, superimposed  with the anterior RIGHT third rib, question pulmonary nodule versus healing fracture ; no definite healing fracture identified on a CT exam from 2015. Bones unremarkable. IMPRESSION: COPD changes with question pulmonary arterial hypertension. No definite acute infiltrate. Questionable nodular density RIGHT upper lobe versus healing fracture of the anterior RIGHT third rib; CT chest recommended without contrast to exclude pulmonary nodule. Electronically Signed   By: Lavonia Dana M.D.   On: 02/04/2015 10:26   I have personally reviewed and evaluated these images and lab results as part of my medical decision-making.   EKG Interpretation   Date/Time:  Wednesday February 04 2015 09:42:44 EST Ventricular Rate:  124 PR Interval:  134 QRS Duration: 90 QT Interval:  305 QTC Calculation: 438 R Axis:   110 Text Interpretation:  Sinus tachycardia LAE, consider biatrial enlargement  Left posterior fascicular block Rate faster Confirmed by Rhiann Boucher  MD,  Oryon Gary ([id number]) on 02/04/2015 10:06:49 AM      MDM   Final diagnoses:  COPD exacerbation ([place])  COPD on home O2 with worsening SOB, coughing, wheezing since last night.  Out of meds x several days.  No chest pain or fever. Poor air exchange throughout.  Nebs, steroids, CXR. No infiltrate but questionable nodule. Chronic leukocytosis, no CO retention.  bipap for retractions and increased work of breathing.  Persistent tachycardia even before albuterol.  D-dimer negative  WOB improving on bipap.  Additional continuous neb given.  Stepdown admission dw DR. Jerilee Hoh.   CRITICAL CARE Performed by: Ezequiel Essex Total critical care time: 45 minutes Critical care time was exclusive of separately billable procedures and treating other patients. Critical care was necessary to treat or prevent imminent or life-threatening deterioration. Critical care was time spent personally by me on the following activities: development of treatment plan  with patient and/or surrogate as well as nursing, discussions with consultants, evaluation of patient's response to treatment, examination of patient, obtaining history from patient or surrogate, ordering and performing treatments and interventions, ordering and review of laboratory studies, ordering and review of radiographic studies, pulse oximetry and re-evaluation of patient's condition.   I personally performed the services described in this documentation, which was scribed in my presence. The recorded information has been reviewed and is accurate.     Ezequiel Essex, MD 02/04/15 [phone]

## 2015-02-05 LAB — BASIC METABOLIC PANEL
ANION GAP: 9 (ref 5–15)
BUN: 14 mg/dL (ref 6–20)
CO2: 27 mmol/L (ref 22–32)
Calcium: 9 mg/dL (ref 8.9–10.3)
Chloride: 99 mmol/L — ABNORMAL LOW (ref 101–111)
Creatinine, Ser: 0.64 mg/dL (ref 0.61–1.24)
GFR calc Af Amer: >60 mL/min (ref >60)
GFR calc non Af Amer: >60 mL/min (ref >60)
GLUCOSE: 153 mg/dL — AB (ref 65–99)
POTASSIUM: 4.3 mmol/L (ref 3.5–5.1)
SODIUM: 135 mmol/L (ref 135–145)

## 2015-02-05 LAB — CBC
HEMATOCRIT: 46 % (ref 39.0–52.0)
HEMOGLOBIN: 14.8 g/dL (ref 13.0–17.0)
MCH: 30 pg (ref 26.0–34.0)
MCHC: 32.2 g/dL (ref 30.0–36.0)
MCV: 93.3 fL (ref 78.0–100.0)
Platelets: 285 10*3/uL (ref 150–400)
RBC: 4.93 MIL/uL (ref 4.22–5.81)
RDW: 15.9 % — ABNORMAL HIGH (ref 11.5–15.5)
WBC: 22 10*3/uL — ABNORMAL HIGH (ref 4.0–10.5)

## 2015-02-05 MED ORDER — CHLORHEXIDINE GLUCONATE CLOTH 2 % EX PADS
6.0000 | MEDICATED_PAD | Freq: Every day | CUTANEOUS | Status: DC
  Administered 2015-02-06: 6 via TOPICAL

## 2015-02-05 MED ORDER — MUPIROCIN 2 % EX OINT
1.0000 "application " | TOPICAL_OINTMENT | Freq: Two times a day (BID) | CUTANEOUS | Status: DC
  Administered 2015-02-05 (×2): 1 via NASAL
  Filled 2015-02-05: qty 22

## 2015-02-05 NOTE — Progress Notes (Signed)
Subjective: He says he feels much better. He required BiPAP until this morning but he is now on nasal oxygen and looks much improved. He was tachypnea can tachycardic on admission.  Objective: Vital signs in last 24 hours: Temp:  [97.2 F (36.2 C)-98.6 F (37 C)] 97.4 F (36.3 C) (12/29 0400) Pulse Rate:  [74-133] 87 (12/29 0800) Resp:  [10-32] 17 (12/29 0800) BP: (100-148)/(82-110) 122/110 mmHg (12/29 0800) SpO2:  [91 %-100 %] 96 % (12/29 0800) FiO2 (%):  [40 %] 40 % (12/28 1200) Weight:  [58.9 kg (129 lb 13.6 oz)-61.236 kg (135 lb)] 60.6 kg (133 lb 9.6 oz) (12/29 0500) Weight change:  Last BM Date: 02/04/15  Intake/Output from previous day: 12/28 0701 - 12/29 0700 In: -  Out: 900 [Urine:900]  PHYSICAL EXAM General appearance: alert, cooperative and mild distress Resp: Decreased breath sounds and prolonged expiratory phase but much better Cardio: regular rate and rhythm, S1, S2 normal, no murmur, click, rub or gallop GI: soft, non-tender; bowel sounds normal; no masses,  no organomegaly Extremities: extremities normal, atraumatic, no cyanosis or edema  Lab Results:  Results for orders placed or performed during the hospital encounter of 02/04/15 (from the past 48 hour(s))  CBC     Status: Abnormal   Collection Time: 02/04/15  9:53 AM  Result Value Ref Range   WBC 30.8 (H) 4.0 - 10.5 K/uL   RBC 5.60 4.22 - 5.81 MIL/uL   Hemoglobin 17.1 (H) 13.0 - 17.0 g/dL   HCT 52.4 (H) 39.0 - 52.0 %   MCV 93.6 78.0 - 100.0 fL   MCH 30.5 26.0 - 34.0 pg   MCHC 32.6 30.0 - 36.0 g/dL   RDW 15.8 (H) 11.5 - 15.5 %   Platelets 315 150 - 400 K/uL  Basic metabolic panel     Status: Abnormal   Collection Time: 02/04/15  9:53 AM  Result Value Ref Range   Sodium 136 135 - 145 mmol/L   Potassium 4.3 3.5 - 5.1 mmol/L   Chloride 99 (L) 101 - 111 mmol/L   CO2 28 22 - 32 mmol/L   Glucose, Bld 128 (H) 65 - 99 mg/dL   BUN 11 6 - 20 mg/dL   Creatinine, Ser 0.71 0.61 - 1.24 mg/dL   Calcium 9.5 8.9  - 10.3 mg/dL   GFR calc non Af Amer >60 >60 mL/min   GFR calc Af Amer >60 >60 mL/min    Comment: (NOTE) The eGFR has been calculated using the CKD EPI equation. This calculation has not been validated in all clinical situations. eGFR's persistently <60 mL/min signify possible Chronic Kidney Disease.    Anion gap 9 5 - 15  Troponin I     Status: None   Collection Time: 02/04/15  9:55 AM  Result Value Ref Range   Troponin I <0.03 <0.031 ng/mL    Comment:        NO INDICATION OF MYOCARDIAL INJURY.   D-dimer, quantitative (not at [facility])     Status: None   Collection Time: 02/04/15  9:55 AM  Result Value Ref Range   D-Dimer, Quant <0.27 0.00 - 0.50 ug/mL-FEU    Comment: (NOTE) At the manufacturer cut-off of 0.50 ug/mL FEU, this assay has been documented to exclude PE with a sensitivity and negative predictive value of 97 to 99%.  At this time, this assay has not been approved by the FDA to exclude DVT/VTE. Results should be correlated with clinical presentation.   Blood gas, arterial ([facility] &  [facility] ONLY)     Status: Abnormal   Collection Time: 02/04/15 10:20 AM  Result Value Ref Range   O2 Content 2.0 L/min   Delivery systems NASAL CANNULA    pH, Arterial 7.457 (H) 7.350 - 7.450   pCO2 arterial 35.3 35.0 - 45.0 mmHg   pO2, Arterial 79.4 (L) 80.0 - 100.0 mmHg   Bicarbonate 25.8 (H) 20.0 - 24.0 mEq/L   Acid-Base Excess 1.0 0.0 - 2.0 mmol/L   O2 Saturation 96.4 %   Collection site RIGHT RADIAL    Drawn by [id number]    Sample type ARTERIAL    Allens test (pass/fail) PASS PASS  MRSA PCR Screening     Status: Abnormal   Collection Time: 02/04/15  1:15 PM  Result Value Ref Range   MRSA by PCR POSITIVE (A) NEGATIVE    Comment:        The GeneXpert MRSA Assay (FDA approved for NASAL specimens only), is one component of a comprehensive MRSA colonization surveillance program. It is not intended to diagnose MRSA infection nor to guide or monitor treatment for MRSA infections. RESULT  CALLED TO, READ BACK BY AND VERIFIED WITH: HEARN,J. AT 1928 ON 02/04/2015 BY BAUGHAM,M.   Blood gas, arterial     Status: Abnormal   Collection Time: 02/04/15  4:10 PM  Result Value Ref Range   FIO2 0.40    O2 Content 40.0 L/min   Delivery systems BILEVEL POSITIVE AIRWAY PRESSURE    Mode BILEVEL POSITIVE AIRWAY PRESSURE    Inspiratory PAP 12    Expiratory PAP 6    pH, Arterial 7.413 7.350 - 7.450   pCO2 arterial 38.2 35.0 - 45.0 mmHg   pO2, Arterial 129 (H) 80.0 - 100.0 mmHg   Bicarbonate 24.5 (H) 20.0 - 24.0 mEq/L   Acid-base deficit 0.1 0.0 - 2.0 mmol/L   O2 Saturation 98.6 %   Collection site LEFT RADIAL    Drawn by [id number]    Sample type ARTERIAL    Allens test (pass/fail) PASS PASS  Basic metabolic panel     Status: Abnormal   Collection Time: 02/05/15  4:11 AM  Result Value Ref Range   Sodium 135 135 - 145 mmol/L   Potassium 4.3 3.5 - 5.1 mmol/L   Chloride 99 (L) 101 - 111 mmol/L   CO2 27 22 - 32 mmol/L   Glucose, Bld 153 (H) 65 - 99 mg/dL   BUN 14 6 - 20 mg/dL   Creatinine, Ser 9.02 0.61 - 1.24 mg/dL   Calcium 9.0 8.9 - 28.4 mg/dL   GFR calc non Af Amer >60 >60 mL/min   GFR calc Af Amer >60 >60 mL/min    Comment: (NOTE) The eGFR has been calculated using the CKD EPI equation. This calculation has not been validated in all clinical situations. eGFR's persistently <60 mL/min signify possible Chronic Kidney Disease.    Anion gap 9 5 - 15  CBC     Status: Abnormal   Collection Time: 02/05/15  4:11 AM  Result Value Ref Range   WBC 22.0 (H) 4.0 - 10.5 K/uL   RBC 4.93 4.22 - 5.81 MIL/uL   Hemoglobin 14.8 13.0 - 17.0 g/dL   HCT 06.9 86.1 - 48.3 %   MCV 93.3 78.0 - 100.0 fL   MCH 30.0 26.0 - 34.0 pg   MCHC 32.2 30.0 - 36.0 g/dL   RDW 07.3 (H) 54.3 - 01.4 %   Platelets 285 150 - 400 K/uL    ABGS  Recent Labs  02/04/15 1610  PHART 7.413  PO2ART 129*  HCO3 24.5*   CULTURES Recent Results (from the past 240 hour(s))  MRSA PCR Screening     Status: Abnormal    Collection Time: 02/04/15  1:15 PM  Result Value Ref Range Status   MRSA by PCR POSITIVE (A) NEGATIVE Final    Comment:        The GeneXpert MRSA Assay (FDA approved for NASAL specimens only), is one component of a comprehensive MRSA colonization surveillance program. It is not intended to diagnose MRSA infection nor to guide or monitor treatment for MRSA infections. RESULT CALLED TO, READ BACK BY AND VERIFIED WITH: HEARN,J. AT 1928 ON 02/04/2015 BY BAUGHAM,M.    Studies/Results: Dg Chest Portable 1 View  02/04/2015  CLINICAL DATA:  Increased shortness of breath over couple days, smoker, COPD, prior pulmonary nodule, GERD EXAM: PORTABLE CHEST 1 VIEW COMPARISON:  Portable exam 1002 hours compared to 12/25/2014 FINDINGS: Normal heart size and mediastinal contours. Enlarged central pulmonary arteries. Emphysematous and minimal bronchitic changes consistent with COPD. No definite acute infiltrate, pleural effusion, or pneumothorax. Questionable nodular density at the lateral RIGHT upper lobe, superimposed with the anterior RIGHT third rib, question pulmonary nodule versus healing fracture ; no definite healing fracture identified on a CT exam from 2015. Bones unremarkable. IMPRESSION: COPD changes with question pulmonary arterial hypertension. No definite acute infiltrate. Questionable nodular density RIGHT upper lobe versus healing fracture of the anterior RIGHT third rib; CT chest recommended without contrast to exclude pulmonary nodule. Electronically Signed   By: Lavonia Dana M.D.   On: 02/04/2015 10:26    Medications:  Prior to Admission:  Prescriptions prior to admission  Medication Sig Dispense Refill Last Dose  . albuterol (PROVENTIL) (5 MG/ML) 0.5% nebulizer solution Take 0.5 mLs (2.5 mg total) by nebulization every 4 (four) hours as needed for wheezing or shortness of breath. 125 vial 12 02/04/2015 at Unknown time  . ALPRAZolam (XANAX) 1 MG tablet Take 1 mg by mouth 3 (three)  times daily.   02/03/2015 at Unknown time  . aspirin EC 81 MG tablet Take 81 mg by mouth daily.   02/03/2015 at Unknown time  . Fluticasone-Salmeterol (ADVAIR DISKUS) 250-50 MCG/DOSE AEPB Inhale 1 puff into the lungs 2 (two) times daily. 60 each 12 02/04/2015 at Unknown time  . oxyCODONE-acetaminophen (PERCOCET) 10-325 MG tablet Take 1 tablet by mouth every 4 (four) hours as needed for pain. 150 tablet 0 02/04/2015 at Unknown time  . predniSONE (DELTASONE) 10 MG tablet Take 1 tablet (10 mg total) by mouth daily with breakfast. 4x3 d,3x3d,2x3d,1x3d. 30 tablet 2 02/04/2015 at Unknown time  . tiotropium (SPIRIVA HANDIHALER) 18 MCG inhalation capsule Place 1 capsule (18 mcg total) into inhaler and inhale every morning. 30 capsule 12 02/03/2015 at Unknown time  . varenicline (CHANTIX CONTINUING MONTH PAK) 1 MG tablet Take 1 tablet (1 mg total) by mouth 2 (two) times daily. 60 tablet 2 Past Week at Unknown time  . amoxicillin-clavulanate (AUGMENTIN) 875-125 MG tablet Take 1 tablet by mouth 2 (two) times daily. (Patient not taking: Reported on 02/04/2015) 20 tablet 3 Not Taking at Unknown time  . benzonatate (TESSALON) 200 MG capsule Take 1 capsule (200 mg total) by mouth 3 (three) times daily. (Patient not taking: Reported on 02/04/2015) 30 capsule 1 Not Taking at Unknown time  . guaiFENesin (MUCINEX) 600 MG 12 hr tablet Take 2 tablets (1,200 mg total) by mouth 2 (two) times daily. (Patient not taking: Reported  on 02/04/2015) 20 tablet 1 Not Taking at Unknown time   Scheduled: . ALPRAZolam  1 mg Oral TID  . aspirin EC  81 mg Oral Daily  . Chlorhexidine Gluconate Cloth  6 each Topical Q0600  . enoxaparin (LOVENOX) injection  40 mg Subcutaneous Q24H  . ipratropium-albuterol  3 mL Nebulization Q6H  . levofloxacin  750 mg Oral Daily  . methylPREDNISolone (SOLU-MEDROL) injection  80 mg Intravenous Q6H  . mometasone-formoterol  2 puff Inhalation BID  . mupirocin ointment  1 application Nasal BID  .  varenicline  1 mg Oral BID   Continuous: . sodium chloride 75 mL/hr at 02/05/15 0541   TCC:[id number] **OR** acetaminophen, albuterol, ondansetron **OR** ondansetron (ZOFRAN) IV, oxyCODONE-acetaminophen **AND** oxyCODONE, senna-docusate  Assesment: He was admitted with acute on chronic respiratory failure. He has severe COPD at baseline. He is much improved. Principal Problem:   Acute on chronic respiratory failure (HCC) Active Problems:   COPD exacerbation (HCC)   Malnutrition of moderate degree (HCC)   Leukocytosis    Plan: I think he is ready to transfer out of stepdown. Continue nasal oxygen. No other new treatments.    LOS: 1 day   Crixus Mcaulay L 02/05/2015, 8:22 AM

## 2015-02-05 NOTE — Progress Notes (Signed)
Report called to [facility] unit 300. Patient to be transferred to 313 via wheelchair.

## 2015-02-05 NOTE — Care Management Note (Signed)
Case Management Note  Patient Details  Name: Cameron Villarreal MRN: [id number] Date of Birth: 26-Jul-1965  Subjective/Objective:                  Pt is from home, lives with his mother and is ind with ADL's. Pt has home O2 and neb machine through [employer]. Pt requires not assistance with ambulation. Pt feels he would benefit from hospital bed because he has difficulty laying flat. Pt is unsure at this time if he wants hospital bed but will think about it. Pt would like bed from [place] if he does decides he does want one. Pt plans to return home with mother and self care at DC.   Action/Plan: Will cont to follow. May need hospital bed.  Expected Discharge Date:      02/07/2015            Expected Discharge Plan:  Home/Self Care  In-House Referral:  NA  Discharge planning Services  CM Consult  Post Acute Care Choice:  Durable Medical Equipment Choice offered to:  Patient  DME Arranged:  Hospital bed DME Agency:  [facility]  HH Arranged:    [facility] Agency:     Status of Service:  In process, will continue to follow  Medicare Important Message Given:    Date Medicare IM Given:    Medicare IM give by:    Date Additional Medicare IM Given:    Additional Medicare Important Message give by:     If discussed at [place] of Stay Meetings, dates discussed:    Additional Comments:  Sherald Barge, RN 02/05/2015, 1:48 PM

## 2015-02-05 NOTE — Progress Notes (Signed)
Mr. Modzelewski arrived to AP-300 rm# [id number].  Report received from [facility], ICU RN.

## 2015-02-06 MED ORDER — OXYCODONE-ACETAMINOPHEN 10-325 MG PO TABS: 1.0000 | ORAL_TABLET | ORAL | Status: DC | PRN

## 2015-02-06 MED ORDER — LEVOFLOXACIN 750 MG PO TABS: 750.0000 mg | ORAL_TABLET | Freq: Every day | ORAL | Status: DC

## 2015-02-06 MED ORDER — ALPRAZOLAM 1 MG PO TABS
1.0000 mg | ORAL_TABLET | Freq: Once | ORAL | Status: AC
  Administered 2015-02-06: 1 mg via ORAL
  Filled 2015-02-06: qty 1

## 2015-02-06 NOTE — Progress Notes (Signed)
Discharged PT per MD order and protocol. Reviewed discharge teaching and handouts given. Pt verbalized understanding and left with all belongings. Prescriptions were handed and explained. VSS. IV catheter D/C.  Patient walked down with staff member. Oswald Hillock, RN

## 2015-02-06 NOTE — Progress Notes (Signed)
Patient stated "I need my xanax to go to sleep." xanax 1 mg po ordered TID, received last dose at 2043. Patient states he takes it three times per day at home plus at bedtime for sleep. Notified Dr. Anastasio Champion, order received for Xanax 1 mg po x 1. Donavan Foil, RN

## 2015-02-06 NOTE — Progress Notes (Signed)
Subjective: He says he feels much better and wants to go home. His breathing is at baseline.  Objective: Vital signs in last 24 hours: Temp:  [97.5 F (36.4 C)-97.9 F (36.6 C)] 97.5 F (36.4 C) (12/30 0512) Pulse Rate:  [71-96] 76 (12/30 0827) Resp:  [10-20] 16 (12/30 0827) BP: (113-123)/(63-91) 113/63 mmHg (12/30 0512) SpO2:  [92 %-100 %] 96 % (12/30 0827) Weight change:  Last BM Date: 02/04/15  Intake/Output from previous day: 12/29 0701 - 12/30 0700 In: 1190 [P.O.:480; I.V.:710] Out: -   PHYSICAL EXAM General appearance: alert, cooperative and no distress Resp: He has bilateral rhonchi which are chronic Cardio: regular rate and rhythm, S1, S2 normal, no murmur, click, rub or gallop GI: soft, non-tender; bowel sounds normal; no masses,  no organomegaly Extremities: extremities normal, atraumatic, no cyanosis or edema  Lab Results:  Results for orders placed or performed during the hospital encounter of 02/04/15 (from the past 48 hour(s))  CBC     Status: Abnormal   Collection Time: 02/04/15  9:53 AM  Result Value Ref Range   WBC 30.8 (H) 4.0 - 10.5 K/uL   RBC 5.60 4.22 - 5.81 MIL/uL   Hemoglobin 17.1 (H) 13.0 - 17.0 g/dL   HCT 52.4 (H) 39.0 - 52.0 %   MCV 93.6 78.0 - 100.0 fL   MCH 30.5 26.0 - 34.0 pg   MCHC 32.6 30.0 - 36.0 g/dL   RDW 15.8 (H) 11.5 - 15.5 %   Platelets 315 150 - 400 K/uL  Basic metabolic panel     Status: Abnormal   Collection Time: 02/04/15  9:53 AM  Result Value Ref Range   Sodium 136 135 - 145 mmol/L   Potassium 4.3 3.5 - 5.1 mmol/L   Chloride 99 (L) 101 - 111 mmol/L   CO2 28 22 - 32 mmol/L   Glucose, Bld 128 (H) 65 - 99 mg/dL   BUN 11 6 - 20 mg/dL   Creatinine, Ser 0.71 0.61 - 1.24 mg/dL   Calcium 9.5 8.9 - 10.3 mg/dL   GFR calc non Af Amer >60 >60 mL/min   GFR calc Af Amer >60 >60 mL/min    Comment: (NOTE) The eGFR has been calculated using the CKD EPI equation. This calculation has not been validated in all clinical  situations. eGFR's persistently <60 mL/min signify possible Chronic Kidney Disease.    Anion gap 9 5 - 15  Troponin I     Status: None   Collection Time: 02/04/15  9:55 AM  Result Value Ref Range   Troponin I <0.03 <0.031 ng/mL    Comment:        NO INDICATION OF MYOCARDIAL INJURY.   D-dimer, quantitative (not at [facility])     Status: None   Collection Time: 02/04/15  9:55 AM  Result Value Ref Range   D-Dimer, Quant <0.27 0.00 - 0.50 ug/mL-FEU    Comment: (NOTE) At the manufacturer cut-off of 0.50 ug/mL FEU, this assay has been documented to exclude PE with a sensitivity and negative predictive value of 97 to 99%.  At this time, this assay has not been approved by the FDA to exclude DVT/VTE. Results should be correlated with clinical presentation.   Blood gas, arterial ([facility] & [facility] ONLY)     Status: Abnormal   Collection Time: 02/04/15 10:20 AM  Result Value Ref Range   O2 Content 2.0 L/min   Delivery systems NASAL CANNULA    pH, Arterial 7.457 (H) 7.350 - 7.450  pCO2 arterial 35.3 35.0 - 45.0 mmHg   pO2, Arterial 79.4 (L) 80.0 - 100.0 mmHg   Bicarbonate 25.8 (H) 20.0 - 24.0 mEq/L   Acid-Base Excess 1.0 0.0 - 2.0 mmol/L   O2 Saturation 96.4 %   Collection site RIGHT RADIAL    Drawn by [phone]    Sample type ARTERIAL    Allens test (pass/fail) PASS PASS  MRSA PCR Screening     Status: Abnormal   Collection Time: 02/04/15  1:15 PM  Result Value Ref Range   MRSA by PCR POSITIVE (A) NEGATIVE    Comment:        The GeneXpert MRSA Assay (FDA approved for NASAL specimens only), is one component of a comprehensive MRSA colonization surveillance program. It is not intended to diagnose MRSA infection nor to guide or monitor treatment for MRSA infections. RESULT CALLED TO, READ BACK BY AND VERIFIED WITH: HEARN,J. AT 1928 ON 02/04/2015 BY BAUGHAM,M.   Blood gas, arterial     Status: Abnormal   Collection Time: 02/04/15  4:10 PM  Result Value Ref Range   FIO2 0.40    O2 Content  40.0 L/min   Delivery systems BILEVEL POSITIVE AIRWAY PRESSURE    Mode BILEVEL POSITIVE AIRWAY PRESSURE    Inspiratory PAP 12    Expiratory PAP 6    pH, Arterial 7.413 7.350 - 7.450   pCO2 arterial 38.2 35.0 - 45.0 mmHg   pO2, Arterial 129 (H) 80.0 - 100.0 mmHg   Bicarbonate 24.5 (H) 20.0 - 24.0 mEq/L   Acid-base deficit 0.1 0.0 - 2.0 mmol/L   O2 Saturation 98.6 %   Collection site LEFT RADIAL    Drawn by [id number]    Sample type ARTERIAL    Allens test (pass/fail) PASS PASS  Basic metabolic panel     Status: Abnormal   Collection Time: 02/05/15  4:11 AM  Result Value Ref Range   Sodium 135 135 - 145 mmol/L   Potassium 4.3 3.5 - 5.1 mmol/L   Chloride 99 (L) 101 - 111 mmol/L   CO2 27 22 - 32 mmol/L   Glucose, Bld 153 (H) 65 - 99 mg/dL   BUN 14 6 - 20 mg/dL   Creatinine, Ser 0.64 0.61 - 1.24 mg/dL   Calcium 9.0 8.9 - 10.3 mg/dL   GFR calc non Af Amer >60 >60 mL/min   GFR calc Af Amer >60 >60 mL/min    Comment: (NOTE) The eGFR has been calculated using the CKD EPI equation. This calculation has not been validated in all clinical situations. eGFR's persistently <60 mL/min signify possible Chronic Kidney Disease.    Anion gap 9 5 - 15  CBC     Status: Abnormal   Collection Time: 02/05/15  4:11 AM  Result Value Ref Range   WBC 22.0 (H) 4.0 - 10.5 K/uL   RBC 4.93 4.22 - 5.81 MIL/uL   Hemoglobin 14.8 13.0 - 17.0 g/dL   HCT 46.0 39.0 - 52.0 %   MCV 93.3 78.0 - 100.0 fL   MCH 30.0 26.0 - 34.0 pg   MCHC 32.2 30.0 - 36.0 g/dL   RDW 15.9 (H) 11.5 - 15.5 %   Platelets 285 150 - 400 K/uL    ABGS  Recent Labs  02/04/15 1610  PHART 7.413  PO2ART 129*  HCO3 24.5*   CULTURES Recent Results (from the past 240 hour(s))  MRSA PCR Screening     Status: Abnormal   Collection Time: 02/04/15  1:15 PM  Result Value Ref Range Status   MRSA by PCR POSITIVE (A) NEGATIVE Final    Comment:        The GeneXpert MRSA Assay (FDA approved for NASAL specimens only), is one component of  a comprehensive MRSA colonization surveillance program. It is not intended to diagnose MRSA infection nor to guide or monitor treatment for MRSA infections. RESULT CALLED TO, READ BACK BY AND VERIFIED WITH: HEARN,J. AT 1928 ON 02/04/2015 BY BAUGHAM,M.    Studies/Results: Dg Chest Portable 1 View  02/04/2015  CLINICAL DATA:  Increased shortness of breath over couple days, smoker, COPD, prior pulmonary nodule, GERD EXAM: PORTABLE CHEST 1 VIEW COMPARISON:  Portable exam 1002 hours compared to 12/25/2014 FINDINGS: Normal heart size and mediastinal contours. Enlarged central pulmonary arteries. Emphysematous and minimal bronchitic changes consistent with COPD. No definite acute infiltrate, pleural effusion, or pneumothorax. Questionable nodular density at the lateral RIGHT upper lobe, superimposed with the anterior RIGHT third rib, question pulmonary nodule versus healing fracture ; no definite healing fracture identified on a CT exam from 2015. Bones unremarkable. IMPRESSION: COPD changes with question pulmonary arterial hypertension. No definite acute infiltrate. Questionable nodular density RIGHT upper lobe versus healing fracture of the anterior RIGHT third rib; CT chest recommended without contrast to exclude pulmonary nodule. Electronically Signed   By: Lavonia Dana M.D.   On: 02/04/2015 10:26    Medications:  Prior to Admission:  Prescriptions prior to admission  Medication Sig Dispense Refill Last Dose  . albuterol (PROVENTIL) (5 MG/ML) 0.5% nebulizer solution Take 0.5 mLs (2.5 mg total) by nebulization every 4 (four) hours as needed for wheezing or shortness of breath. 125 vial 12 02/04/2015 at Unknown time  . ALPRAZolam (XANAX) 1 MG tablet Take 1 mg by mouth 3 (three) times daily.   02/03/2015 at Unknown time  . aspirin EC 81 MG tablet Take 81 mg by mouth daily.   02/03/2015 at Unknown time  . Fluticasone-Salmeterol (ADVAIR DISKUS) 250-50 MCG/DOSE AEPB Inhale 1 puff into the lungs 2 (two)  times daily. 60 each 12 02/04/2015 at Unknown time  . oxyCODONE-acetaminophen (PERCOCET) 10-325 MG tablet Take 1 tablet by mouth every 4 (four) hours as needed for pain. 150 tablet 0 02/04/2015 at Unknown time  . predniSONE (DELTASONE) 10 MG tablet Take 1 tablet (10 mg total) by mouth daily with breakfast. 4x3 d,3x3d,2x3d,1x3d. 30 tablet 2 02/04/2015 at Unknown time  . tiotropium (SPIRIVA HANDIHALER) 18 MCG inhalation capsule Place 1 capsule (18 mcg total) into inhaler and inhale every morning. 30 capsule 12 02/03/2015 at Unknown time  . varenicline (CHANTIX CONTINUING MONTH PAK) 1 MG tablet Take 1 tablet (1 mg total) by mouth 2 (two) times daily. 60 tablet 2 Past Week at Unknown time  . amoxicillin-clavulanate (AUGMENTIN) 875-125 MG tablet Take 1 tablet by mouth 2 (two) times daily. (Patient not taking: Reported on 02/04/2015) 20 tablet 3 Not Taking at Unknown time  . benzonatate (TESSALON) 200 MG capsule Take 1 capsule (200 mg total) by mouth 3 (three) times daily. (Patient not taking: Reported on 02/04/2015) 30 capsule 1 Not Taking at Unknown time  . guaiFENesin (MUCINEX) 600 MG 12 hr tablet Take 2 tablets (1,200 mg total) by mouth 2 (two) times daily. (Patient not taking: Reported on 02/04/2015) 20 tablet 1 Not Taking at Unknown time   Scheduled: . ALPRAZolam  1 mg Oral TID  . aspirin EC  81 mg Oral Daily  . Chlorhexidine Gluconate Cloth  6 each Topical Q0600  . enoxaparin (LOVENOX)  injection  40 mg Subcutaneous Q24H  . ipratropium-albuterol  3 mL Nebulization Q6H  . levofloxacin  750 mg Oral Daily  . methylPREDNISolone (SOLU-MEDROL) injection  80 mg Intravenous Q6H  . mometasone-formoterol  2 puff Inhalation BID  . mupirocin ointment  1 application Nasal BID  . varenicline  1 mg Oral BID   Continuous: . sodium chloride 75 mL/hr at 02/05/15 2045   BMB:[id number] **OR** acetaminophen, albuterol, ondansetron **OR** ondansetron (ZOFRAN) IV, oxyCODONE-acetaminophen **AND** oxyCODONE,  senna-docusate  Assesment: He was admitted with COPD exacerbation and acute on chronic respiratory failure. He is significantly better. He wants to go home which I think is appropriate  He has what may be a pulmonary nodule and he will have outpatient CT chest without contrast Principal Problem:   Acute on chronic respiratory failure (HCC) Active Problems:   COPD exacerbation (HCC)   Malnutrition of moderate degree (HCC)   Leukocytosis    Plan: Discharge home today    LOS: 2 days   Cameron Villarreal L 02/06/2015, 9:20 AM

## 2015-02-06 NOTE — Care Management Note (Signed)
Case Management Note  Patient Details  Name: Cameron Villarreal MRN: [id number] Date of Birth: 02/18/1965  Subjective/Objective:                    Action/Plan:   Expected Discharge Date:                  Expected Discharge Plan:  Home/Self Care  In-House Referral:  NA  Discharge planning Services  CM Consult  Post Acute Care Choice:  Durable Medical Equipment Choice offered to:  Patient  DME Arranged:    DME Agency:  [facility]  HH Arranged:    [place] Agency:     Status of Service:  Completed, signed off  Medicare Important Message Given:    Date Medicare IM Given:    Medicare IM give by:    Date Additional Medicare IM Given:    Additional Medicare Important Message give by:     If discussed at [place] of Stay Meetings, dates discussed:    Additional Comments: Pt discharged home today. Pt does not want a hospital bed at this time but will fax orders to [facility] if pt changes his mind. No further CM needs noted. Christinia Gully [place], RN 02/06/2015, 10:39 AM

## 2015-02-06 NOTE — Discharge Summary (Signed)
Physician Discharge Summary  Patient ID: Cameron Villarreal MRN: [id number] DOB/AGE: [birth year] 49 y.o. Primary Care Physician:Arjay Jaskiewicz L, MD Admit date: 02/04/2015 Discharge date: 02/06/2015    Discharge Diagnoses:   Principal Problem:   Acute on chronic respiratory failure ([place]) Active Problems:   COPD exacerbation (HCC)   Malnutrition of moderate degree (HCC)   Leukocytosis  possible pulmonary nodule    Medication List    STOP taking these medications        amoxicillin-clavulanate 875-125 MG tablet  Commonly known as:  AUGMENTIN      TAKE these medications        albuterol (5 MG/ML) 0.5% nebulizer solution  Commonly known as:  PROVENTIL  Take 0.5 mLs (2.5 mg total) by nebulization every 4 (four) hours as needed for wheezing or shortness of breath.     ALPRAZolam 1 MG tablet  Commonly known as:  XANAX  Take 1 mg by mouth 3 (three) times daily.     aspirin EC 81 MG tablet  Take 81 mg by mouth daily.     benzonatate 200 MG capsule  Commonly known as:  TESSALON  Take 1 capsule (200 mg total) by mouth 3 (three) times daily.     Fluticasone-Salmeterol 250-50 MCG/DOSE Aepb  Commonly known as:  ADVAIR DISKUS  Inhale 1 puff into the lungs 2 (two) times daily.     guaiFENesin 600 MG 12 hr tablet  Commonly known as:  MUCINEX  Take 2 tablets (1,200 mg total) by mouth 2 (two) times daily.     levofloxacin 750 MG tablet  Commonly known as:  LEVAQUIN  Take 1 tablet (750 mg total) by mouth daily.     oxyCODONE-acetaminophen 10-325 MG tablet  Commonly known as:  PERCOCET  Take 1 tablet by mouth every 4 (four) hours as needed for pain.     predniSONE 10 MG tablet  Commonly known as:  DELTASONE  Take 1 tablet (10 mg total) by mouth daily with breakfast. 4x3 d,3x3d,2x3d,1x3d.     tiotropium 18 MCG inhalation capsule  Commonly known as:  SPIRIVA HANDIHALER  Place 1 capsule (18 mcg total) into inhaler and inhale every morning.     varenicline 1 MG tablet  Commonly  known as:  CHANTIX CONTINUING MONTH PAK  Take 1 tablet (1 mg total) by mouth 2 (two) times daily.        Discharged Condition: Improved    Consults: None  Significant Diagnostic Studies: Dg Chest Portable 1 View  02/04/2015  CLINICAL DATA:  Increased shortness of breath over couple days, smoker, COPD, prior pulmonary nodule, GERD EXAM: PORTABLE CHEST 1 VIEW COMPARISON:  Portable exam 1002 hours compared to 12/25/2014 FINDINGS: Normal heart size and mediastinal contours. Enlarged central pulmonary arteries. Emphysematous and minimal bronchitic changes consistent with COPD. No definite acute infiltrate, pleural effusion, or pneumothorax. Questionable nodular density at the lateral RIGHT upper lobe, superimposed with the anterior RIGHT third rib, question pulmonary nodule versus healing fracture ; no definite healing fracture identified on a CT exam from 2015. Bones unremarkable. IMPRESSION: COPD changes with question pulmonary arterial hypertension. No definite acute infiltrate. Questionable nodular density RIGHT upper lobe versus healing fracture of the anterior RIGHT third rib; CT chest recommended without contrast to exclude pulmonary nodule. Electronically Signed   By: Lavonia Dana M.D.   On: 02/04/2015 10:26    Lab Results: Basic Metabolic Panel:  Recent Labs  02/04/15 0953 02/05/15 0411  NA 136 135  K 4.3 4.3  CL 99*  99*  CO2 28 27  GLUCOSE 128* 153*  BUN 11 14  CREATININE 0.71 0.64  CALCIUM 9.5 9.0   Liver Function Tests: No results for input(s): AST, ALT, ALKPHOS, BILITOT, PROT, ALBUMIN in the last 72 hours.   CBC:  Recent Labs  02/04/15 0953 02/05/15 0411  WBC 30.8* 22.0*  HGB 17.1* 14.8  HCT 52.4* 46.0  MCV 93.6 93.3  PLT 315 285    Recent Results (from the past 240 hour(s))  MRSA PCR Screening     Status: Abnormal   Collection Time: 02/04/15  1:15 PM  Result Value Ref Range Status   MRSA by PCR POSITIVE (A) NEGATIVE Final    Comment:        The  GeneXpert MRSA Assay (FDA approved for NASAL specimens only), is one component of a comprehensive MRSA colonization surveillance program. It is not intended to diagnose MRSA infection nor to guide or monitor treatment for MRSA infections. RESULT CALLED TO, READ BACK BY AND VERIFIED WITH: HEARN,J. AT 1928 ON 02/04/2015 BY BAUGHAM,M.      [facility] Course: This is a 49 year old with known severe COPD at baseline who came to the emergency department with increasing shortness of breath cough and congestion. He initially required BiPAP but improved markedly over the next several hours. He was initially admitted to stepdown transition to a regular floor and by 48 hours or so into his admission was feeling back at baseline and ready for discharge. He had a density in the right upper lobe on chest x-ray that was not clearly identifiable and he will need outpatient CT of the chest without contrast.  Discharge Exam: Blood pressure 113/63, pulse 76, temperature 97.5 F (36.4 C), temperature source Oral, resp. rate 16, height 5\' 8"  (1.727 m), weight 60.6 kg (133 lb 9.6 oz), SpO2 96 %. He's awake and alert. His chest is clear now. He looks comfortable  Disposition: Home. He does not want home health services. He will have an outpatient CT of the chest      Discharge Instructions    Discharge patient    Complete by:  As directed              Signed: Minette Manders L   02/06/2015, 9:26 AM

## 2015-02-19 ENCOUNTER — Ambulatory Visit (INDEPENDENT_AMBULATORY_CARE_PROVIDER_SITE_OTHER): Payer: Medicare Other | Admitting: Otolaryngology

## 2015-05-13 DIAGNOSIS — K4090 Unilateral inguinal hernia, without obstruction or gangrene, not specified as recurrent: Secondary | ICD-10-CM | POA: Diagnosis not present

## 2015-05-13 DIAGNOSIS — J449 Chronic obstructive pulmonary disease, unspecified: Secondary | ICD-10-CM | POA: Diagnosis not present

## 2015-05-13 DIAGNOSIS — M545 Low back pain: Secondary | ICD-10-CM | POA: Diagnosis not present

## 2015-05-13 DIAGNOSIS — F419 Anxiety disorder, unspecified: Secondary | ICD-10-CM | POA: Diagnosis not present

## 2015-06-28 ENCOUNTER — Encounter (HOSPITAL_COMMUNITY): Payer: Self-pay

## 2015-06-28 ENCOUNTER — Emergency Department (HOSPITAL_COMMUNITY): Payer: Medicare Other

## 2015-06-28 ENCOUNTER — Emergency Department (HOSPITAL_COMMUNITY)
Admission: EM | Admit: 2015-06-28 | Discharge: 2015-06-28 | Disposition: A | Discharge status: Home / Self Care | Payer: Medicare Other | Attending: Emergency Medicine | Admitting: Emergency Medicine

## 2015-06-28 DIAGNOSIS — Z79899 Other long term (current) drug therapy: Secondary | ICD-10-CM | POA: Insufficient documentation

## 2015-06-28 DIAGNOSIS — F17210 Nicotine dependence, cigarettes, uncomplicated: Secondary | ICD-10-CM | POA: Diagnosis not present

## 2015-06-28 DIAGNOSIS — J441 Chronic obstructive pulmonary disease with (acute) exacerbation: Secondary | ICD-10-CM | POA: Diagnosis not present

## 2015-06-28 DIAGNOSIS — R0602 Shortness of breath: Secondary | ICD-10-CM | POA: Diagnosis not present

## 2015-06-28 LAB — BASIC METABOLIC PANEL
Anion gap: 8 (ref 5–15)
BUN: 9 mg/dL (ref 6–20)
CALCIUM: 9.3 mg/dL (ref 8.9–10.3)
CO2: 34 mmol/L — AB (ref 22–32)
CREATININE: 0.64 mg/dL (ref 0.61–1.24)
Chloride: 91 mmol/L — ABNORMAL LOW (ref 101–111)
Glucose, Bld: 144 mg/dL — ABNORMAL HIGH (ref 65–99)
Potassium: 4.1 mmol/L (ref 3.5–5.1)
SODIUM: 133 mmol/L — AB (ref 135–145)

## 2015-06-28 LAB — CBC
HCT: 49.1 % (ref 39.0–52.0)
Hemoglobin: 15.7 g/dL (ref 13.0–17.0)
MCH: 30 pg (ref 26.0–34.0)
MCHC: 32 g/dL (ref 30.0–36.0)
MCV: 93.7 fL (ref 78.0–100.0)
PLATELETS: 333 10*3/uL (ref 150–400)
RBC: 5.24 MIL/uL (ref 4.22–5.81)
RDW: 14.9 % (ref 11.5–15.5)
WBC: 16.9 10*3/uL — AB (ref 4.0–10.5)

## 2015-06-28 MED ORDER — PREDNISONE 10 MG PO TABS: 10.0000 mg | ORAL_TABLET | Freq: Every day | ORAL | Status: DC

## 2015-06-28 MED ORDER — METHYLPREDNISOLONE SODIUM SUCC 125 MG IJ SOLR
125.0000 mg | Freq: Once | INTRAMUSCULAR | Status: AC
  Administered 2015-06-28: 125 mg via INTRAVENOUS
  Filled 2015-06-28: qty 2

## 2015-06-28 MED ORDER — ALBUTEROL SULFATE (2.5 MG/3ML) 0.083% IN NEBU: 5.0000 mg | INHALATION_SOLUTION | Freq: Once | RESPIRATORY_TRACT | Status: DC

## 2015-06-28 MED ORDER — IPRATROPIUM-ALBUTEROL 0.5-2.5 (3) MG/3ML IN SOLN
3.0000 mL | Freq: Once | RESPIRATORY_TRACT | Status: AC
  Administered 2015-06-28: 3 mL via RESPIRATORY_TRACT
  Filled 2015-06-28: qty 3

## 2015-06-28 NOTE — ED Provider Notes (Signed)
CSN: [id number]     Arrival date & time 06/28/15  1813 History   First MD Initiated Contact with Patient 06/28/15 1823     Chief Complaint  Patient presents with  . Shortness of Breath     (Consider location/radiation/quality/duration/timing/severity/associated sxs/prior Treatment) HPI.Marland KitchenMarland KitchenMarland KitchenPatient with known COPD presents with wheezing and coughing for the past 2 days. He is a cigarette smoker. He has been on prednisone in the past, but his recent prescription ended 2 days ago. He has been using his nebulizer machine at home. He is onprn oxygen. No fever, sweats, chills, rusty sputum. Severity is moderate.  Past Medical History  Diagnosis Date  . COPD (chronic obstructive pulmonary disease) (HCC)     Severe centrilobular emphysema, on home o2  . Hypercholesterolemia   . S/P endoscopy March 2012    Reflux esophagitis, no ulcerations  . S/P colonoscopy March 2012    Normal  . Pulmonary nodule 07/04/2011  . GERD (gastroesophageal reflux disease)    Past Surgical History  Procedure Laterality Date  . Cholecystectomy    . Appendectomy    . Right inguinal hernia repair    . Hernia repair    . Knee surgery     Family History  Problem Relation Age of Onset  . Cirrhosis Father     Deceased, ETOH cirrhosis  . Stomach cancer      Aunt   Social History  Substance Use Topics  . Smoking status: Current Every Day Smoker -- 0.50 packs/day for 30 years    Types: Cigarettes  . Smokeless tobacco: Never Used  . Alcohol Use: 0.6 oz/week    1 Standard drinks or equivalent per week     Comment: Occasional    Review of Systems  All other systems reviewed and are negative.     Allergies  Codeine  Home Medications   Prior to Admission medications   Medication Sig Start Date End Date Taking? Authorizing Provider  albuterol (PROVENTIL) (5 MG/ML) 0.5% nebulizer solution Take 0.5 mLs (2.5 mg total) by nebulization every 4 (four) hours as needed for wheezing or shortness of breath.  12/27/14  Yes Sinda Du, MD  ALPRAZolam Duanne Moron) 1 MG tablet Take 1 mg by mouth 3 (three) times daily.   Yes Historical Provider, MD  Fluticasone-Salmeterol (ADVAIR DISKUS) 250-50 MCG/DOSE AEPB Inhale 1 puff into the lungs 2 (two) times daily. 12/27/14  Yes Sinda Du, MD  oxyCODONE-acetaminophen (PERCOCET) 10-325 MG tablet Take 1 tablet by mouth every 4 (four) hours as needed for pain. 02/06/15  Yes Sinda Du, MD  tiotropium (SPIRIVA HANDIHALER) 18 MCG inhalation capsule Place 1 capsule (18 mcg total) into inhaler and inhale every morning. Patient taking differently: Place 18 mcg into inhaler and inhale daily as needed (Shortness of Breathe).  12/27/14  Yes Sinda Du, MD  benzonatate (TESSALON) 200 MG capsule Take 1 capsule (200 mg total) by mouth 3 (three) times daily. Patient not taking: Reported on 02/04/2015 12/27/14   Sinda Du, MD  guaiFENesin (MUCINEX) 600 MG 12 hr tablet Take 2 tablets (1,200 mg total) by mouth 2 (two) times daily. Patient not taking: Reported on 02/04/2015 12/27/14   Sinda Du, MD  levofloxacin (LEVAQUIN) 750 MG tablet Take 1 tablet (750 mg total) by mouth daily. 02/06/15   Sinda Du, MD  predniSONE (DELTASONE) 10 MG tablet Take 1 tablet (10 mg total) by mouth daily with breakfast. 4 tablets 3 days, 3 tablets 3 days, 2 tablets 3 days, one tablet and 3 days 06/28/15  [name], MD  varenicline (CHANTIX CONTINUING MONTH PAK) 1 MG tablet Take 1 tablet (1 mg total) by mouth 2 (two) times daily. 08/24/14   Sinda Du, MD   BP 116/86 mmHg  Pulse 88  Temp(Src) 98.2 F (36.8 C) (Oral)  Resp 26  Ht 5\' 8"  (1.727 m)  Wt 135 lb (61.236 kg)  BMI 20.53 kg/m2  SpO2 92% Physical Exam  Constitutional: He is oriented to person, place, and time. He appears well-developed and well-nourished.  HENT:  Head: Normocephalic and atraumatic.  Eyes: Conjunctivae and EOM are normal. Pupils are equal, round, and reactive to light.  Neck: Normal range  of motion. Neck supple.  Cardiovascular: Normal rate and regular rhythm.   Pulmonary/Chest: Effort normal.  Bilateral expiratory wheeze  Abdominal: Soft. Bowel sounds are normal.  Musculoskeletal: Normal range of motion.  Neurological: He is alert and oriented to person, place, and time.  Skin: Skin is warm and dry.  Psychiatric: He has a normal mood and affect. His behavior is normal.  Nursing note and vitals reviewed.   ED Course  Procedures (including critical care time) Labs Review Labs Reviewed  BASIC METABOLIC PANEL - Abnormal; Notable for the following:    Sodium 133 (*)    Chloride 91 (*)    CO2 34 (*)    Glucose, Bld 144 (*)    All other components within normal limits  CBC - Abnormal; Notable for the following:    WBC 16.9 (*)    All other components within normal limits    Imaging Review Dg Chest 2 View  06/28/2015  CLINICAL DATA:  Shortness of breath for 2 days.  History of COPD. EXAM: CHEST  2 VIEW COMPARISON:  Chest x-rays dated 02/04/2015 and 12/25/2014. FINDINGS: Heart size is normal. Overall cardiomediastinal silhouette is stable in size and configuration. Lungs are hyperexpanded compatible with given history of COPD. Probable associated chronic bronchitic changes centrally. Evidence of chronic interstitial lung disease. No new airspace opacities to suggest a developing pneumonia. No pleural effusion or pneumothorax seen. Mild degenerative change again seen within the slightly kyphotic thoracic spine. No evidence of acute osseous abnormality. IMPRESSION: 1. Hyperexpanded lungs compatible with the given history of COPD/emphysema. Suspect associated chronic bronchitic changes centrally and chronic interstitial lung disease. 2. No acute findings.  No evidence of pneumonia. Electronically Signed   By: Franki Cabot M.D.   On: 06/28/2015 19:40   I have personally reviewed and evaluated these images and lab results as part of my medical decision-making.   EKG  Interpretation   Date/Time:  Sunday Jun 28 2015 18:22:52 EDT Ventricular Rate:  86 PR Interval:  127 QRS Duration: 99 QT Interval:  356 QTC Calculation: 426 R Axis:   96 Text Interpretation:  Sinus rhythm Borderline right axis deviation Abnrm  T, consider ischemia, anterolateral lds ST elev, probable normal early  repol pattern Baseline wander in lead(s) V5 Confirmed by Jourdin Connors  MD, Xerxes Agrusa  ([id number]) on 06/28/2015 6:27:58 PM      MDM   Final diagnoses:  COPD with acute exacerbation [facility])    Patient has known COPD. He has been on prednisone many times. Chest x-ray shows no obvious pneumonia. He feels better after a nebulizer treatment and IV steroids. Discharge medication prednisone taper.    [name], MD 06/28/15 2021

## 2015-06-28 NOTE — ED Notes (Signed)
Shortness of breath X2 days. History of COPD, patient states "it got worse today"

## 2015-06-28 NOTE — ED Notes (Signed)
Pt transported to xray 

## 2015-06-28 NOTE — ED Notes (Signed)
Pt reports breathing is improved after neb tx

## 2015-06-28 NOTE — Discharge Instructions (Signed)
Chest x-ray showed no pneumonia. Start your prednisone taper tomorrow. Stop smoking. Follow-up your primary care doctor

## 2015-09-09 DIAGNOSIS — J9610 Chronic respiratory failure, unspecified whether with hypoxia or hypercapnia: Secondary | ICD-10-CM | POA: Diagnosis not present

## 2015-09-09 DIAGNOSIS — J449 Chronic obstructive pulmonary disease, unspecified: Secondary | ICD-10-CM | POA: Diagnosis not present

## 2015-09-09 DIAGNOSIS — M545 Low back pain: Secondary | ICD-10-CM | POA: Diagnosis not present

## 2015-12-10 DIAGNOSIS — G4700 Insomnia, unspecified: Secondary | ICD-10-CM | POA: Diagnosis not present

## 2015-12-10 DIAGNOSIS — F419 Anxiety disorder, unspecified: Secondary | ICD-10-CM | POA: Diagnosis not present

## 2015-12-10 DIAGNOSIS — J449 Chronic obstructive pulmonary disease, unspecified: Secondary | ICD-10-CM | POA: Diagnosis not present

## 2015-12-10 DIAGNOSIS — M545 Low back pain: Secondary | ICD-10-CM | POA: Diagnosis not present

## 2016-01-29 ENCOUNTER — Emergency Department (HOSPITAL_COMMUNITY)
Admission: EM | Admit: 2016-01-29 | Discharge: 2016-01-29 | Disposition: A | Discharge status: Home / Self Care | Payer: Medicare Other | Attending: Emergency Medicine | Admitting: Emergency Medicine

## 2016-01-29 ENCOUNTER — Emergency Department (HOSPITAL_COMMUNITY): Payer: Medicare Other

## 2016-01-29 ENCOUNTER — Encounter (HOSPITAL_COMMUNITY): Payer: Self-pay | Admitting: *Deleted

## 2016-01-29 DIAGNOSIS — J189 Pneumonia, unspecified organism: Secondary | ICD-10-CM | POA: Insufficient documentation

## 2016-01-29 DIAGNOSIS — Z79899 Other long term (current) drug therapy: Secondary | ICD-10-CM | POA: Insufficient documentation

## 2016-01-29 DIAGNOSIS — F17210 Nicotine dependence, cigarettes, uncomplicated: Secondary | ICD-10-CM | POA: Diagnosis not present

## 2016-01-29 DIAGNOSIS — J441 Chronic obstructive pulmonary disease with (acute) exacerbation: Secondary | ICD-10-CM

## 2016-01-29 DIAGNOSIS — R079 Chest pain, unspecified: Secondary | ICD-10-CM | POA: Diagnosis not present

## 2016-01-29 DIAGNOSIS — R0602 Shortness of breath: Secondary | ICD-10-CM | POA: Diagnosis not present

## 2016-01-29 LAB — CBC WITH DIFFERENTIAL/PLATELET
BASOS PCT: 0 %
Basophils Absolute: 0 10*3/uL (ref 0.0–0.1)
EOS ABS: 0.2 10*3/uL (ref 0.0–0.7)
Eosinophils Relative: 1 %
HCT: 46.4 % (ref 39.0–52.0)
HEMOGLOBIN: 14.8 g/dL (ref 13.0–17.0)
Lymphocytes Relative: 10 %
Lymphs Abs: 2.4 10*3/uL (ref 0.7–4.0)
MCH: 29.7 pg (ref 26.0–34.0)
MCHC: 31.9 g/dL (ref 30.0–36.0)
MCV: 93 fL (ref 78.0–100.0)
MONOS PCT: 11 %
Monocytes Absolute: 2.5 10*3/uL — ABNORMAL HIGH (ref 0.1–1.0)
NEUTROS PCT: 78 %
Neutro Abs: 17.7 10*3/uL — ABNORMAL HIGH (ref 1.7–7.7)
Platelets: 335 10*3/uL (ref 150–400)
RBC: 4.99 MIL/uL (ref 4.22–5.81)
RDW: 14.5 % (ref 11.5–15.5)
WBC: 22.7 10*3/uL — ABNORMAL HIGH (ref 4.0–10.5)

## 2016-01-29 LAB — COMPREHENSIVE METABOLIC PANEL
ALBUMIN: 3.4 g/dL — AB (ref 3.5–5.0)
ALK PHOS: 101 U/L (ref 38–126)
ALT: 14 U/L — ABNORMAL LOW (ref 17–63)
ANION GAP: 8 (ref 5–15)
AST: 20 U/L (ref 15–41)
BUN: 11 mg/dL (ref 6–20)
CO2: 29 mmol/L (ref 22–32)
Calcium: 8.9 mg/dL (ref 8.9–10.3)
Chloride: 93 mmol/L — ABNORMAL LOW (ref 101–111)
Creatinine, Ser: 0.56 mg/dL — ABNORMAL LOW (ref 0.61–1.24)
GFR calc Af Amer: >60 mL/min (ref >60)
GFR calc non Af Amer: >60 mL/min (ref >60)
GLUCOSE: 159 mg/dL — AB (ref 65–99)
POTASSIUM: 4.3 mmol/L (ref 3.5–5.1)
SODIUM: 130 mmol/L — AB (ref 135–145)
Total Bilirubin: 0.3 mg/dL (ref 0.3–1.2)
Total Protein: 7.4 g/dL (ref 6.5–8.1)

## 2016-01-29 LAB — TROPONIN I: Troponin I: <0.03 ng/mL (ref <0.03)

## 2016-01-29 MED ORDER — ALBUTEROL (5 MG/ML) CONTINUOUS INHALATION SOLN
15.0000 mg/h | INHALATION_SOLUTION | Freq: Once | RESPIRATORY_TRACT | Status: AC
  Administered 2016-01-29: 15 mg/h via RESPIRATORY_TRACT
  Filled 2016-01-29: qty 20

## 2016-01-29 MED ORDER — LEVOFLOXACIN 500 MG PO TABS
500.0000 mg | ORAL_TABLET | Freq: Once | ORAL | Status: AC
  Administered 2016-01-29: 500 mg via ORAL
  Filled 2016-01-29: qty 1

## 2016-01-29 MED ORDER — LEVOFLOXACIN 500 MG PO TABS: 500.0000 mg | ORAL_TABLET | Freq: Every day | ORAL | 0 refills | Status: DC

## 2016-01-29 MED ORDER — MAGNESIUM SULFATE 2 GM/50ML IV SOLN
INTRAVENOUS | Status: AC
  Filled 2016-01-29: qty 50

## 2016-01-29 MED ORDER — METHYLPREDNISOLONE SODIUM SUCC 125 MG IJ SOLR
INTRAMUSCULAR | Status: AC
  Filled 2016-01-29: qty 2

## 2016-01-29 MED ORDER — METHYLPREDNISOLONE SODIUM SUCC 125 MG IJ SOLR
125.0000 mg | Freq: Once | INTRAMUSCULAR | Status: AC
  Administered 2016-01-29: 125 mg via INTRAVENOUS

## 2016-01-29 MED ORDER — PREDNISONE 10 MG PO TABS: ORAL_TABLET | ORAL | 0 refills | Status: DC

## 2016-01-29 MED ORDER — MAGNESIUM SULFATE 2 GM/50ML IV SOLN
2.0000 g | Freq: Once | INTRAVENOUS | Status: AC
  Administered 2016-01-29: 2 g via INTRAVENOUS

## 2016-01-29 MED ORDER — IPRATROPIUM-ALBUTEROL 0.5-2.5 (3) MG/3ML IN SOLN: 3.0000 mL | Freq: Once | RESPIRATORY_TRACT | Status: DC

## 2016-01-29 NOTE — ED Triage Notes (Addendum)
Pt reports increasing SOB x 2 days. Pt reports he takes breathing treatments and they aren't working. Pt reports cough with thick yellow mucous. Pt is on 2L as needed.

## 2016-01-29 NOTE — ED Notes (Signed)
Ambulated patient around the nurses station with O2 and pulse ox. Patient stayed aty 95 % with 2lpm of O2. Made nurse aware.

## 2016-01-29 NOTE — ED Notes (Signed)
Pt O2 sats 86% on 2L increased O2 to 4L.

## 2016-01-29 NOTE — ED Notes (Signed)
Pt returned from xray. Pt on 2l O2 sats dropped to 86%. Placed back on breathing treatment. Pt also asking for his nightly pain & anxiety meds.

## 2016-01-29 NOTE — ED Notes (Signed)
Called checking on lab work, advised give them 15 minutes.

## 2016-01-29 NOTE — ED Provider Notes (Signed)
[facility] DEPT Provider Note   CSN: [id number] Arrival date & time: 01/29/16  2010  By signing my name below, I, Reola Mosher, attest that this documentation has been prepared under the direction and in the presence of Daleen Bo, MD. Electronically Signed: Reola Mosher, ED Scribe. 01/29/16. 8:47 PM.  History   Chief Complaint Chief Complaint  Patient presents with  . Shortness of Breath   The history is provided by the patient and medical records. No language interpreter was used.    HPI Comments: Cameron Villarreal is a 50 y.o. male with a PMHx of COPD and tobacco abuse, who presents to the Emergency Department complaining of gradually worsening, constant shortness of breath onset approximately 2 days ago. He notes associated upper, left-sided back/ribcage pain, loss of appetite, chills, diaphoresis, and a persistent, productive cough with yellow sputum secondary to his shortness of breath. Pt has a h/o COPD, and states that his symptoms today are similar to his typical exacerbations. Per prior chart review, pt has been seen in the ED for same multiple times, with his last admission related to COPD exacerbation being on 02/04/15 (approxmiately one year ago). He has used three at-home Albuterol nebulizer treatments, his Albuterol inhaler, and Advil at home today with minimal relief of his symptoms. Pt is prescribed 2L oxygen therapy PRN at home. He is current, everyday smoker. He denies fever, leg swelling, lower extremity pain, or any other associated symptoms.   PCP: Alonza Bogus, MD  Past Medical History:  Diagnosis Date  . COPD (chronic obstructive pulmonary disease) (HCC)    Severe centrilobular emphysema, on home o2  . GERD (gastroesophageal reflux disease)   . Hypercholesterolemia   . Pulmonary nodule 07/04/2011  . S/P colonoscopy March 2012   Normal  . S/P endoscopy March 2012   Reflux esophagitis, no ulcerations   Patient Active Problem List   Diagnosis Date Noted  . Acute on chronic respiratory failure ([place]) 02/04/2015  . Leukocytosis 02/04/2015  . COPD with acute exacerbation ([place]) 12/25/2014  . Malnutrition of moderate degree ([place]) 07/03/2014  . Dehydration 07/02/2014  . Polycythemia 07/01/2014  . Anxiety state 06/24/2013  . Chronic back pain greater than 3 months duration 06/24/2013  . Community acquired pneumonia 06/22/2013  . Respiratory failure, acute ([place]) 06/22/2013  . Chest pain at rest 07/26/2011  . Pulmonary nodule 07/04/2011  . Hyperglycemia, drug-induced 07/04/2011  . Acute respiratory failure ([place]) 07/02/2011  . CAP (community acquired pneumonia) 07/02/2011  . COPD exacerbation ([place]) 07/02/2011  . Tobacco abuse 07/02/2011  . LUQ abdominal pain 06/17/2010  . RECTAL BLEEDING 04/21/2010  . ABDOMINAL PAIN-EPIGASTRIC 04/21/2010   Past Surgical History:  Procedure Laterality Date  . APPENDECTOMY    . CHOLECYSTECTOMY    . HERNIA REPAIR    . KNEE SURGERY    . Right inguinal hernia repair      Home Medications    Prior to Admission medications   Medication Sig Start Date End Date Taking? Authorizing Provider  albuterol (PROVENTIL) (5 MG/ML) 0.5% nebulizer solution Take 0.5 mLs (2.5 mg total) by nebulization every 4 (four) hours as needed for wheezing or shortness of breath. 12/27/14  Yes Sinda Du, MD  ALPRAZolam Duanne Moron) 1 MG tablet Take 1 mg by mouth 3 (three) times daily.   Yes Historical Provider, MD  Fluticasone-Salmeterol (ADVAIR DISKUS) 250-50 MCG/DOSE AEPB Inhale 1 puff into the lungs 2 (two) times daily. 12/27/14  Yes Sinda Du, MD  oxyCODONE-acetaminophen (PERCOCET) 10-325 MG tablet Take 1 tablet  by mouth every 4 (four) hours as needed for pain. 02/06/15  Yes Sinda Du, MD  Phenir-PE-Sod Sal-Caff Cit (COUGH/COLD MEDICINE PO) Take 5-10 mLs by mouth daily as needed (FOR COUGH).   Yes Historical Provider, MD  PROAIR HFA 108 [phone] Base) MCG/ACT inhaler Inhale 1-2 puffs into the lungs every 6  (six) hours as needed for wheezing or shortness of breath.  12/10/15  Yes Historical Provider, MD  traZODone (DESYREL) 50 MG tablet Take 50 mg by mouth at bedtime. 12/10/15  Yes Historical Provider, MD   Family History Family History  Problem Relation Age of Onset  . Cirrhosis Father     Deceased, ETOH cirrhosis  . Stomach cancer      Aunt   Social History Social History  Substance Use Topics  . Smoking status: Current Every Day Smoker    Packs/day: 0.50    Years: 30.00    Types: Cigarettes  . Smokeless tobacco: Never Used  . Alcohol use 0.6 oz/week    1 Standard drinks or equivalent per week     Comment: Occasional   Allergies   Codeine  Review of Systems Review of Systems  All other systems reviewed and are negative.  Physical Exam Updated Vital Signs BP 105/72   Pulse 106   Temp 98.9 F (37.2 C) (Oral)   Resp 14   Ht 5\' 8"  (1.727 m)   Wt 135 lb (61.2 kg)   SpO2 (!) 89%   BMI 20.53 kg/m   Physical Exam  Constitutional: He is oriented to person, place, and time. He appears well-developed and well-nourished.  HENT:  Head: Normocephalic and atraumatic.  Right Ear: External ear normal.  Left Ear: External ear normal.  Eyes: Conjunctivae and EOM are normal. Pupils are equal, round, and reactive to light.  Neck: Normal range of motion and phonation normal. Neck supple.  Cardiovascular: Normal rate, regular rhythm and normal heart sounds.   Pulmonary/Chest: Tachypnea noted. He exhibits no bony tenderness.  Increased work of breathing is noted. There are intercostal retractions. Poor air movement bilaterally.  Abdominal: Soft. There is no tenderness.  Musculoskeletal: Normal range of motion.  Neurological: He is alert and oriented to person, place, and time. No cranial nerve deficit or sensory deficit. He exhibits normal muscle tone. Coordination normal.  Skin: Skin is warm, dry and intact.  Psychiatric: He has a normal mood and affect. His behavior is normal.  Judgment and thought content normal.  Nursing note and vitals reviewed.  ED Treatments / Results  DIAGNOSTIC STUDIES: Oxygen Saturation is 94% on 2L [place], adequate by my interpretation.   COORDINATION OF CARE: 8:36 PM-Discussed next steps with pt. Pt verbalized understanding and is agreeable with the plan.   Labs (all labs ordered are listed, but only abnormal results are displayed) Labs Reviewed  CBC WITH DIFFERENTIAL/PLATELET - Abnormal; Notable for the following:       Result Value   WBC 22.7 (*)    Neutro Abs 17.7 (*)    Monocytes Absolute 2.5 (*)    All other components within normal limits  COMPREHENSIVE METABOLIC PANEL - Abnormal; Notable for the following:    Sodium 130 (*)    Chloride 93 (*)    Glucose, Bld 159 (*)    Creatinine, Ser 0.56 (*)    Albumin 3.4 (*)    ALT 14 (*)    All other components within normal limits  TROPONIN I    EKG  EKG Interpretation  Date/Time:  Friday January 29 2016 20:27:34 EST Ventricular Rate:  133 PR Interval:    QRS Duration: 140 QT Interval:  300 QTC Calculation: 447 R Axis:   110 Text Interpretation:  Sinus tachycardia Consider right atrial enlargement Nonspecific intraventricular conduction delay Artifact in lead(s) I II III aVR aVL aVF V1 V4 V5 V6 Since last tracing rate faster Confirmed by [facility]  MD, Miku Udall [phone]) on 01/29/2016 9:54:22 PM      Radiology Dg Chest 2 View  Result Date: 01/29/2016 CLINICAL DATA:  Left-sided chest pain and shortness of Breath EXAM: CHEST  2 VIEW COMPARISON:  06/28/2015 FINDINGS: Cardiac shadow is within normal limits. The lungs are hyperinflated consistent with COPD. Diffuse interstitial changes are noted of a chronic nature. Increased density is noted in the right lung base consistent with focal right lower lobe infiltrate. No other infiltrate or effusion is seen. No bony abnormality is noted. IMPRESSION: Right lower lobe infiltrate superimposed over chronic interstitial changes.  Electronically Signed   By: Inez Catalina M.D.   On: 01/29/2016 21:59    Procedures Procedures   Medications Ordered in ED Medications  albuterol (PROVENTIL,VENTOLIN) solution continuous neb (15 mg/hr Nebulization Given 01/29/16 2045)  methylPREDNISolone sodium succinate (SOLU-MEDROL) 125 mg/2 mL injection 125 mg (125 mg Intravenous Given 01/29/16 2043)  magnesium sulfate IVPB 2 g 50 mL (0 g Intravenous Stopped 01/29/16 2145)  levofloxacin (LEVAQUIN) tablet 500 mg (500 mg Oral Given 01/29/16 2306)   Initial Impression / Assessment and Plan / ED Course  I have reviewed the triage vital signs and the nursing notes.  Pertinent labs & imaging results that were available during my care of the patient were reviewed by me and considered in my medical decision making (see chart for details).  Clinical Course as of Jan 29 2327  Fri Jan 29, 2016  2237 High WBC: (!) 22.7 [EW]  2237 Normal Troponin I: <0.03 [EW]  2237 Low Sodium: (!) 130 [EW]  2237 High Glucose: (!) 159 [EW]  2237 Right lower lobe infiltrate DG Chest 2 View [EW]  2248 Patient is feeling better at this time and feels like he is at his baseline. Lungs have persistent poor air movement  [EW]  2326 He was able ambulate easily in the hall on his own, with oxygen saturations staying at 95%.  [EW]    Clinical Course User Index [EW] Daleen Bo, MD   Medications  albuterol (PROVENTIL,VENTOLIN) solution continuous neb (15 mg/hr Nebulization Given 01/29/16 2045)  methylPREDNISolone sodium succinate (SOLU-MEDROL) 125 mg/2 mL injection 125 mg (125 mg Intravenous Given 01/29/16 2043)  magnesium sulfate IVPB 2 g 50 mL (0 g Intravenous Stopped 01/29/16 2145)  levofloxacin (LEVAQUIN) tablet 500 mg (500 mg Oral Given 01/29/16 2306)    Patient Vitals for the past 24 hrs:  BP Temp Temp src Pulse Resp SpO2 Height Weight  01/29/16 2300 105/72 - - 106 - (!) 89 % - -  01/29/16 2130 101/65 - - 117 14 100 % - -  01/29/16 2100 105/71 - - (!)  125 22 100 % - -  01/29/16 2047 - - - - - 94 % - -  01/29/16 2031 109/80 98.9 F (37.2 C) Oral (!) 136 (!) 27 90 % 5\' 8"  (1.727 m) 135 lb (61.2 kg)  01/29/16 2030 112/78 - - (!) 127 20 94 % - -    11:26 PM Reevaluation with update and discussion. After initial assessment and treatment, an updated evaluation reveals He is more comfortable now. Findings discussed with patient, and  all questions were answered. Mikkel Charrette L    Final Clinical Impressions(s) / ED Diagnoses   Final diagnoses:  COPD exacerbation ([place])  Community acquired pneumonia of right lung, unspecified part of lung   COPD exacerbation with ongoing smoking. Small right lower lobe infiltrate, without evidence for severe sepsis, or metabolic instability. Improved with treatment stable for discharge.  Nursing Notes Reviewed/ Care Coordinated Applicable Imaging Reviewed Interpretation of Laboratory Data incorporated into ED treatment  The patient appears reasonably screened and/or stabilized for discharge and I doubt any other medical condition or other [facility] requiring further screening, evaluation, or treatment in the ED at this time prior to discharge.  Plan: Home Medications- continue; Home Treatments- Stop smoking; return here if the recommended treatment, does not improve the symptoms; Recommended follow up- PCP 1 week    New Prescriptions New Prescriptions   No medications on file   I personally performed the services described in this documentation, which was scribed in my presence. The recorded information has been reviewed and is accurate.    Daleen Bo, MD 01/29/16 2330

## 2016-01-29 NOTE — ED Notes (Signed)
Pt alert & oriented x4, stable gait. Patient given discharge instructions, paperwork & prescription(s). Patient  instructed to stop at the registration desk to finish any additional paperwork. Patient verbalized understanding. Pt left department w/ no further questions. 

## 2016-01-29 NOTE — Discharge Instructions (Signed)
Use your albuterol inhaler every 4 hours as needed for cough or trouble breathing.  Start taking the prednisone and antibiotic prescriptions tomorrow morning.

## 2016-03-11 DIAGNOSIS — J449 Chronic obstructive pulmonary disease, unspecified: Secondary | ICD-10-CM | POA: Diagnosis not present

## 2016-03-11 DIAGNOSIS — M545 Low back pain: Secondary | ICD-10-CM | POA: Diagnosis not present

## 2016-03-11 DIAGNOSIS — J9611 Chronic respiratory failure with hypoxia: Secondary | ICD-10-CM | POA: Diagnosis not present

## 2016-03-11 DIAGNOSIS — F419 Anxiety disorder, unspecified: Secondary | ICD-10-CM | POA: Diagnosis not present

## 2016-04-08 DIAGNOSIS — J449 Chronic obstructive pulmonary disease, unspecified: Secondary | ICD-10-CM | POA: Diagnosis not present

## 2016-05-09 DIAGNOSIS — J449 Chronic obstructive pulmonary disease, unspecified: Secondary | ICD-10-CM | POA: Diagnosis not present

## 2016-06-08 DIAGNOSIS — J449 Chronic obstructive pulmonary disease, unspecified: Secondary | ICD-10-CM | POA: Diagnosis not present

## 2016-06-23 DIAGNOSIS — K0889 Other specified disorders of teeth and supporting structures: Secondary | ICD-10-CM | POA: Diagnosis not present

## 2016-06-23 DIAGNOSIS — M545 Low back pain: Secondary | ICD-10-CM | POA: Diagnosis not present

## 2016-06-23 DIAGNOSIS — E46 Unspecified protein-calorie malnutrition: Secondary | ICD-10-CM | POA: Diagnosis not present

## 2016-06-23 DIAGNOSIS — J449 Chronic obstructive pulmonary disease, unspecified: Secondary | ICD-10-CM | POA: Diagnosis not present

## 2016-07-09 DIAGNOSIS — J449 Chronic obstructive pulmonary disease, unspecified: Secondary | ICD-10-CM | POA: Diagnosis not present

## 2016-08-08 DIAGNOSIS — J449 Chronic obstructive pulmonary disease, unspecified: Secondary | ICD-10-CM | POA: Diagnosis not present

## 2016-08-23 DIAGNOSIS — E46 Unspecified protein-calorie malnutrition: Secondary | ICD-10-CM | POA: Diagnosis not present

## 2016-08-23 DIAGNOSIS — M545 Low back pain: Secondary | ICD-10-CM | POA: Diagnosis not present

## 2016-08-23 DIAGNOSIS — J449 Chronic obstructive pulmonary disease, unspecified: Secondary | ICD-10-CM | POA: Diagnosis not present

## 2016-09-08 DIAGNOSIS — J449 Chronic obstructive pulmonary disease, unspecified: Secondary | ICD-10-CM | POA: Diagnosis not present

## 2016-10-09 DIAGNOSIS — J449 Chronic obstructive pulmonary disease, unspecified: Secondary | ICD-10-CM | POA: Diagnosis not present

## 2016-11-08 DIAGNOSIS — J449 Chronic obstructive pulmonary disease, unspecified: Secondary | ICD-10-CM | POA: Diagnosis not present

## 2016-12-09 DIAGNOSIS — J449 Chronic obstructive pulmonary disease, unspecified: Secondary | ICD-10-CM | POA: Diagnosis not present

## 2017-01-02 DIAGNOSIS — Z79891 Long term (current) use of opiate analgesic: Secondary | ICD-10-CM | POA: Diagnosis not present

## 2017-01-08 DIAGNOSIS — J449 Chronic obstructive pulmonary disease, unspecified: Secondary | ICD-10-CM | POA: Diagnosis not present

## 2017-01-18 DIAGNOSIS — M545 Low back pain: Secondary | ICD-10-CM | POA: Diagnosis not present

## 2017-01-18 DIAGNOSIS — J449 Chronic obstructive pulmonary disease, unspecified: Secondary | ICD-10-CM | POA: Diagnosis not present

## 2017-01-18 DIAGNOSIS — E46 Unspecified protein-calorie malnutrition: Secondary | ICD-10-CM | POA: Diagnosis not present

## 2017-01-26 ENCOUNTER — Emergency Department (HOSPITAL_COMMUNITY): Payer: Medicare Other

## 2017-01-26 ENCOUNTER — Encounter (HOSPITAL_COMMUNITY): Payer: Self-pay | Admitting: Emergency Medicine

## 2017-01-26 ENCOUNTER — Other Ambulatory Visit: Payer: Self-pay

## 2017-01-26 ENCOUNTER — Inpatient Hospital Stay (HOSPITAL_COMMUNITY)
Admission: EM | Admit: 2017-01-26 | Discharge: 2017-01-29 | DRG: 193 | Discharge status: Against Medical Advice | Payer: Medicare Other | Attending: Pulmonary Disease | Admitting: Pulmonary Disease

## 2017-01-26 DIAGNOSIS — G8929 Other chronic pain: Secondary | ICD-10-CM | POA: Diagnosis present

## 2017-01-26 DIAGNOSIS — M549 Dorsalgia, unspecified: Secondary | ICD-10-CM

## 2017-01-26 DIAGNOSIS — Z79899 Other long term (current) drug therapy: Secondary | ICD-10-CM

## 2017-01-26 DIAGNOSIS — I272 Pulmonary hypertension, unspecified: Secondary | ICD-10-CM | POA: Diagnosis present

## 2017-01-26 DIAGNOSIS — Z885 Allergy status to narcotic agent status: Secondary | ICD-10-CM

## 2017-01-26 DIAGNOSIS — J9602 Acute respiratory failure with hypercapnia: Secondary | ICD-10-CM

## 2017-01-26 DIAGNOSIS — J9622 Acute and chronic respiratory failure with hypercapnia: Secondary | ICD-10-CM | POA: Diagnosis not present

## 2017-01-26 DIAGNOSIS — J181 Lobar pneumonia, unspecified organism: Secondary | ICD-10-CM | POA: Diagnosis not present

## 2017-01-26 DIAGNOSIS — R7989 Other specified abnormal findings of blood chemistry: Secondary | ICD-10-CM | POA: Diagnosis present

## 2017-01-26 DIAGNOSIS — I248 Other forms of acute ischemic heart disease: Secondary | ICD-10-CM | POA: Diagnosis not present

## 2017-01-26 DIAGNOSIS — Z9981 Dependence on supplemental oxygen: Secondary | ICD-10-CM | POA: Diagnosis not present

## 2017-01-26 DIAGNOSIS — F419 Anxiety disorder, unspecified: Secondary | ICD-10-CM | POA: Diagnosis present

## 2017-01-26 DIAGNOSIS — R748 Abnormal levels of other serum enzymes: Secondary | ICD-10-CM | POA: Diagnosis not present

## 2017-01-26 DIAGNOSIS — E7800 Pure hypercholesterolemia, unspecified: Secondary | ICD-10-CM | POA: Diagnosis not present

## 2017-01-26 DIAGNOSIS — D75839 Thrombocytosis, unspecified: Secondary | ICD-10-CM | POA: Diagnosis present

## 2017-01-26 DIAGNOSIS — R05 Cough: Secondary | ICD-10-CM | POA: Diagnosis not present

## 2017-01-26 DIAGNOSIS — M48061 Spinal stenosis, lumbar region without neurogenic claudication: Secondary | ICD-10-CM | POA: Diagnosis present

## 2017-01-26 DIAGNOSIS — Z8614 Personal history of Methicillin resistant Staphylococcus aureus infection: Secondary | ICD-10-CM

## 2017-01-26 DIAGNOSIS — R911 Solitary pulmonary nodule: Secondary | ICD-10-CM | POA: Diagnosis present

## 2017-01-26 DIAGNOSIS — D72829 Elevated white blood cell count, unspecified: Secondary | ICD-10-CM | POA: Diagnosis not present

## 2017-01-26 DIAGNOSIS — A419 Sepsis, unspecified organism: Secondary | ICD-10-CM | POA: Diagnosis not present

## 2017-01-26 DIAGNOSIS — R109 Unspecified abdominal pain: Secondary | ICD-10-CM | POA: Diagnosis not present

## 2017-01-26 DIAGNOSIS — R918 Other nonspecific abnormal finding of lung field: Secondary | ICD-10-CM | POA: Diagnosis not present

## 2017-01-26 DIAGNOSIS — J9621 Acute and chronic respiratory failure with hypoxia: Secondary | ICD-10-CM | POA: Diagnosis not present

## 2017-01-26 DIAGNOSIS — Z681 Body mass index (BMI) 19 or less, adult: Secondary | ICD-10-CM

## 2017-01-26 DIAGNOSIS — K219 Gastro-esophageal reflux disease without esophagitis: Secondary | ICD-10-CM | POA: Diagnosis present

## 2017-01-26 DIAGNOSIS — R0600 Dyspnea, unspecified: Secondary | ICD-10-CM | POA: Diagnosis not present

## 2017-01-26 DIAGNOSIS — I10 Essential (primary) hypertension: Secondary | ICD-10-CM | POA: Diagnosis not present

## 2017-01-26 DIAGNOSIS — R0602 Shortness of breath: Secondary | ICD-10-CM

## 2017-01-26 DIAGNOSIS — J189 Pneumonia, unspecified organism: Secondary | ICD-10-CM | POA: Diagnosis present

## 2017-01-26 DIAGNOSIS — R778 Other specified abnormalities of plasma proteins: Secondary | ICD-10-CM | POA: Diagnosis present

## 2017-01-26 DIAGNOSIS — E871 Hypo-osmolality and hyponatremia: Secondary | ICD-10-CM | POA: Diagnosis not present

## 2017-01-26 DIAGNOSIS — Z720 Tobacco use: Secondary | ICD-10-CM | POA: Diagnosis not present

## 2017-01-26 DIAGNOSIS — D473 Essential (hemorrhagic) thrombocythemia: Secondary | ICD-10-CM | POA: Diagnosis present

## 2017-01-26 DIAGNOSIS — F1910 Other psychoactive substance abuse, uncomplicated: Secondary | ICD-10-CM | POA: Diagnosis present

## 2017-01-26 DIAGNOSIS — J440 Chronic obstructive pulmonary disease with acute lower respiratory infection: Secondary | ICD-10-CM | POA: Diagnosis not present

## 2017-01-26 DIAGNOSIS — J441 Chronic obstructive pulmonary disease with (acute) exacerbation: Secondary | ICD-10-CM | POA: Diagnosis present

## 2017-01-26 DIAGNOSIS — F17210 Nicotine dependence, cigarettes, uncomplicated: Secondary | ICD-10-CM | POA: Diagnosis present

## 2017-01-26 DIAGNOSIS — E46 Unspecified protein-calorie malnutrition: Secondary | ICD-10-CM | POA: Diagnosis present

## 2017-01-26 DIAGNOSIS — J9692 Respiratory failure, unspecified with hypercapnia: Secondary | ICD-10-CM | POA: Diagnosis not present

## 2017-01-26 HISTORY — DX: Anxiety disorder, unspecified: F41.9

## 2017-01-26 HISTORY — DX: Dorsalgia, unspecified: M54.9

## 2017-01-26 LAB — CBC WITH DIFFERENTIAL/PLATELET
BASOS ABS: 0 10*3/uL (ref 0.0–0.1)
Basophils Relative: 0 %
EOS ABS: 0 10*3/uL (ref 0.0–0.7)
Eosinophils Relative: 0 %
HCT: 47.9 % (ref 39.0–52.0)
HEMOGLOBIN: 14.9 g/dL (ref 13.0–17.0)
LYMPHS PCT: 5 %
Lymphs Abs: 1.4 10*3/uL (ref 0.7–4.0)
MCH: 28.3 pg (ref 26.0–34.0)
MCHC: 31.1 g/dL (ref 30.0–36.0)
MCV: 90.9 fL (ref 78.0–100.0)
MONO ABS: 4.2 10*3/uL — AB (ref 0.1–1.0)
Monocytes Relative: 15 %
NEUTROS ABS: 22.5 10*3/uL — AB (ref 1.7–7.7)
Neutrophils Relative %: 80 %
PLATELETS: 547 10*3/uL — AB (ref 150–400)
RBC: 5.27 MIL/uL (ref 4.22–5.81)
RDW: 14.3 % (ref 11.5–15.5)
WBC: 28.1 10*3/uL — ABNORMAL HIGH (ref 4.0–10.5)

## 2017-01-26 LAB — BLOOD GAS, ARTERIAL
ACID-BASE EXCESS: 5.5 mmol/L — AB (ref 0.0–2.0)
BICARBONATE: 27.8 mmol/L (ref 20.0–28.0)
DRAWN BY: 22223
Delivery systems: POSITIVE
Expiratory PAP: 6
FIO2: 40
INSPIRATORY PAP: 12
O2 SAT: 96.1 %
PCO2 ART: 59.2 mmHg — AB (ref 32.0–48.0)
PO2 ART: 96.5 mmHg (ref 83.0–108.0)
RATE: 10 resp/min
pH, Arterial: 7.338 — ABNORMAL LOW (ref 7.350–7.450)

## 2017-01-26 LAB — COMPREHENSIVE METABOLIC PANEL
ALK PHOS: 123 U/L (ref 38–126)
ALT: 12 U/L — ABNORMAL LOW (ref 17–63)
ANION GAP: 14 (ref 5–15)
AST: 19 U/L (ref 15–41)
Albumin: 3.7 g/dL (ref 3.5–5.0)
BILIRUBIN TOTAL: 0.4 mg/dL (ref 0.3–1.2)
BUN: 25 mg/dL — ABNORMAL HIGH (ref 6–20)
CALCIUM: 9.7 mg/dL (ref 8.9–10.3)
CO2: 28 mmol/L (ref 22–32)
CREATININE: 0.65 mg/dL (ref 0.61–1.24)
Chloride: 90 mmol/L — ABNORMAL LOW (ref 101–111)
GFR calc non Af Amer: >60 mL/min (ref >60)
GLUCOSE: 137 mg/dL — AB (ref 65–99)
Potassium: 4.6 mmol/L (ref 3.5–5.1)
Sodium: 132 mmol/L — ABNORMAL LOW (ref 135–145)
TOTAL PROTEIN: 8.6 g/dL — AB (ref 6.5–8.1)

## 2017-01-26 LAB — INFLUENZA PANEL BY PCR (TYPE A & B)
INFLAPCR: NEGATIVE
INFLBPCR: NEGATIVE

## 2017-01-26 LAB — TROPONIN I: TROPONIN I: 0.04 ng/mL — AB (ref <0.03)

## 2017-01-26 LAB — BRAIN NATRIURETIC PEPTIDE: B NATRIURETIC PEPTIDE 5: 622 pg/mL — AB (ref 0.0–100.0)

## 2017-01-26 LAB — PHOSPHORUS: Phosphorus: 3.7 mg/dL (ref 2.5–4.6)

## 2017-01-26 LAB — CG4 I-STAT (LACTIC ACID): LACTIC ACID, VENOUS: 1.96 mmol/L — AB (ref 0.5–1.9)

## 2017-01-26 LAB — I-STAT CG4 LACTIC ACID, ED: Lactic Acid, Venous: 2.21 mmol/L (ref 0.5–1.9)

## 2017-01-26 LAB — MAGNESIUM: MAGNESIUM: 2.1 mg/dL (ref 1.7–2.4)

## 2017-01-26 MED ORDER — MORPHINE SULFATE (PF) 4 MG/ML IV SOLN
4.0000 mg | Freq: Once | INTRAVENOUS | Status: AC
  Administered 2017-01-26: 4 mg via INTRAVENOUS
  Filled 2017-01-26: qty 1

## 2017-01-26 MED ORDER — VANCOMYCIN HCL IN DEXTROSE 1-5 GM/200ML-% IV SOLN
1000.0000 mg | Freq: Once | INTRAVENOUS | Status: AC
  Administered 2017-01-26: 1000 mg via INTRAVENOUS
  Filled 2017-01-26: qty 200

## 2017-01-26 MED ORDER — ALBUTEROL (5 MG/ML) CONTINUOUS INHALATION SOLN
10.0000 mg/h | INHALATION_SOLUTION | RESPIRATORY_TRACT | Status: DC
  Administered 2017-01-26: 10 mg/h via RESPIRATORY_TRACT
  Filled 2017-01-26: qty 20

## 2017-01-26 MED ORDER — SODIUM CHLORIDE 0.9 % IV BOLUS (SEPSIS)
500.0000 mL | Freq: Once | INTRAVENOUS | Status: AC
  Administered 2017-01-26: 500 mL via INTRAVENOUS

## 2017-01-26 MED ORDER — SODIUM CHLORIDE 0.9 % IV BOLUS (SEPSIS)
250.0000 mL | Freq: Once | INTRAVENOUS | Status: AC
  Administered 2017-01-26: 250 mL via INTRAVENOUS

## 2017-01-26 MED ORDER — MAGNESIUM SULFATE 2 GM/50ML IV SOLN
2.0000 g | Freq: Once | INTRAVENOUS | Status: AC
  Administered 2017-01-26: 2 g via INTRAVENOUS
  Filled 2017-01-26: qty 50

## 2017-01-26 MED ORDER — PIPERACILLIN-TAZOBACTAM 3.375 G IVPB 30 MIN
3.3750 g | Freq: Once | INTRAVENOUS | Status: AC
  Administered 2017-01-26: 3.375 g via INTRAVENOUS
  Filled 2017-01-26: qty 50

## 2017-01-26 MED ORDER — ALBUTEROL SULFATE (2.5 MG/3ML) 0.083% IN NEBU: 5.0000 mg | INHALATION_SOLUTION | Freq: Once | RESPIRATORY_TRACT | Status: DC

## 2017-01-26 MED ORDER — METHYLPREDNISOLONE SODIUM SUCC 125 MG IJ SOLR
INTRAMUSCULAR | Status: AC
  Filled 2017-01-26: qty 2

## 2017-01-26 MED ORDER — LORAZEPAM 2 MG/ML IJ SOLN: 1.0000 mg | Freq: Every evening | INTRAMUSCULAR | Status: DC | PRN

## 2017-01-26 MED ORDER — METHYLPREDNISOLONE SODIUM SUCC 125 MG IJ SOLR
125.0000 mg | Freq: Once | INTRAMUSCULAR | Status: AC
  Administered 2017-01-26: 125 mg via INTRAVENOUS
  Filled 2017-01-26: qty 2

## 2017-01-26 MED ORDER — SODIUM CHLORIDE 0.9 % IV BOLUS (SEPSIS)
1000.0000 mL | Freq: Once | INTRAVENOUS | Status: AC
  Administered 2017-01-26: 1000 mL via INTRAVENOUS

## 2017-01-26 NOTE — H&P (Signed)
History and Physical    URBAN NAVAL JKD:[id number] DOB: 02-16-1965 DOA: 01/26/2017  PCP: Sinda Du, MD   Patient coming from: Home.  I have personally briefly reviewed patient's old medical records in [place]  Chief Complaint: Shortness of breath and cough.  HPI: Cameron Villarreal is a 51 y.o. male with medical history significant of anxiety, chronic back pain, COPD with severe centrilobular emphysema on home oxygen, tobacco use, pulmonary nodule, GERD, hypercholesterolemia who is coming to the emergency department with complaints of progressively worse dyspnea, wheezing and yellowish sputum productive cough for the past 3 days that is not relieved by his regular nebulizer treatment.  He complains of subjective fevers, chills and pleuritic chest pain when coughing.  He has been more anxious than usual and have felt some palpitations.  He denies headache, rhinorrhea, sore throat, typical precordial chest pain, dizziness, diaphoresis, orthopnea or pitting edema of the lower extremities.  No abdominal pain, nausea, emesis, diarrhea, melena or hematochezia.  He gets occasional constipation.  He denies dysuria, frequency or hematuria.  No polyuria, polydipsia, polyphagia or blurred vision.  ED Course: Initial vital signs in the emergency department temperature 37.9C, (100.2 F), pulse 124, respiratory rate 23 with accessory muscle use, O2 saturation 66% on room air and blood pressure 95/68 mmHg.   Lab work: His white count was 28.1 with 80% neutrophils, hemoglobin 14.9 g/dL and platelets 547.  Initial lactic acid was 2.21 and follow-up 1.96 mmol/L.  ABG showed a pH of 7.338, PCO2 of 59.2 and PO2 of 96.5 mmHg on BiPAP with an FiO2 of 40%.   Influenza panel by PCR was negative.  Troponin level was 0.4 ng/mL.  BNP was 622 pg/mL.  CMP shows a sodium of 132, potassium 4.6, chloride 90 and CO2 28 millimolar/L with an anion gap of 14.  Glucose 137, calcium 9.7, magnesium 2.1, phosphorus 3.7 BUN  25 and creatinine 0.65 mg/dL.  His total protein was 8.6 g/dL and ALT was 12 U/L.  All other liver function tests were normal.   Imaging: A 1 view portable chest x-ray showed emphysema with a slightly increased interstitial opacities which could be mild edema versus interstitial infection.  Treatment:  he was started on BiPAP ventilation which increase his O2 saturation to the mid to high 90s.  He was also given a 10 mg albuterol continuous nebulization treatment, a normal saline 1750 mL bolus, methylprednisolone 125 mg IVP x1, morphine sulfate 4 mg IVP x1, vancomycin and Zosyn per pharmacy.  Review of Systems: As per HPI otherwise 10 point review of systems negative.    Past Medical History:  Diagnosis Date  . . .  Anxiety Chronic back pain COPD (chronic obstructive pulmonary disease) (HCC)    Severe centrilobular emphysema, on home o2  . GERD (gastroesophageal reflux disease)   . Hypercholesterolemia   . Pulmonary nodule 07/04/2011  . S/P colonoscopy March 2012   Normal  . S/P endoscopy March 2012   Reflux esophagitis, no ulcerations    Past Surgical History:  Procedure Laterality Date  . APPENDECTOMY    . CHOLECYSTECTOMY    . HERNIA REPAIR    . KNEE SURGERY    . Right inguinal hernia repair       reports that he has been smoking cigarettes.  He has a 15.00 pack-year smoking history. he has never used smokeless tobacco. He reports that he does not drink alcohol or use drugs.  Allergies  Allergen Reactions  . Codeine Itching and Nausea  Only    Family History  Problem Relation Age of Onset  . Cirrhosis Father        Deceased, ETOH cirrhosis  . Alcoholism Father   . Depression Brother   . Stomach cancer Unknown        Aunt    Prior to Admission medications   Medication Sig Start Date End Date Taking? Authorizing Provider  albuterol (PROVENTIL) (2.5 MG/3ML) 0.083% nebulizer solution Take 2.5 mg by nebulization every 4 (four) hours as needed for wheezing or shortness  of breath.  01/18/17  Yes [provider]  ALPRAZolam Duanne Moron) 1 MG tablet Take 1 mg by mouth 3 (three) times daily.   Yes [provider]  ENSURE (ENSURE) Take 237 mLs by mouth 2 (two) times daily between meals.   Yes [provider]  Fluticasone-Salmeterol (ADVAIR DISKUS) 250-50 MCG/DOSE AEPB Inhale 1 puff into the lungs 2 (two) times daily. 12/27/14  Yes Sinda Du, MD  oxyCODONE-acetaminophen (PERCOCET) 10-325 MG tablet Take 1 tablet by mouth every 4 (four) hours as needed for pain. 02/06/15  Yes Sinda Du, MD  PROAIR HFA 108 [phone] Base) MCG/ACT inhaler Inhale 1-2 puffs into the lungs every 6 (six) hours as needed for wheezing or shortness of breath.  12/10/15  Yes [provider]  traZODone (DESYREL) 50 MG tablet Take 50 mg by mouth at bedtime. 12/10/15  Yes [provider]    Physical Exam: Vitals:   01/26/17 2020 01/26/17 2030 01/26/17 2100 01/26/17 2141  BP:  114/82 105/80   Pulse:  (!) 119 (!) 109 (!) 112  Resp:  16 17 (!) 26  Temp: 100.2 F (37.9 C)     TempSrc: Rectal     SpO2:  100% 100% 94%  Weight:      Height:        Constitutional: Mildly anxious, on BiPAP ventilation, but otherwise in NAD. Eyes: PERRL, lids and conjunctivae normal ENMT: BiPAP mask on.  Mucous membranes are moist. Posterior pharynx clear of any exudate or lesions. Neck: normal, supple, no masses, no thyromegaly Respiratory: Decreased breath sounds with bilateral wheezing and rhonchi.  No crackles.  On BiPAP ventilation.  No accessory muscle use.  Cardiovascular: Tachycardic at 629 bpm, soft systolic murmur on LLSB, no rubs / gallops. No extremity edema. 2+ pedal pulses. No carotid bruits.  Abdomen: Soft, no tenderness, no masses palpated. No hepatosplenomegaly. Bowel sounds positive.  Musculoskeletal: Mild clubbing.  No cyanosis. Good ROM, no contractures. Normal muscle tone.  Skin: Positive onychomycosis of fingernails. Neurologic: CN 2-12 grossly  intact. Sensation intact, DTR normal. Strength 5/5 in all 4.  Psychiatric: Normal judgment and insight. Alert and oriented x 4.  Mildly anxious mood.    Labs on Admission: I have personally reviewed following labs and imaging studies  CBC: Recent Labs  Lab 01/26/17 1914  WBC 28.1*  NEUTROABS 22.5*  HGB 14.9  HCT 47.9  MCV 90.9  PLT 528*   Basic Metabolic Panel: Recent Labs  Lab 01/26/17 1914 01/26/17 2042 01/26/17 2050  NA 132*  --   --   K 4.6  --   --   CL 90*  --   --   CO2 28  --   --   GLUCOSE 137*  --   --   BUN 25*  --   --   CREATININE 0.65  --   --   CALCIUM 9.7  --   --   MG  --  2.1  --  PHOS  --   --  3.7   GFR: Estimated Creatinine Clearance: 76.8 mL/min (by C-G formula based on SCr of 0.65 mg/dL). Liver Function Tests: Recent Labs  Lab 01/26/17 1914  AST 19  ALT 12*  ALKPHOS 123  BILITOT 0.4  PROT 8.6*  ALBUMIN 3.7   No results for input(s): LIPASE, AMYLASE in the last 168 hours. No results for input(s): AMMONIA in the last 168 hours. Coagulation Profile: No results for input(s): INR, PROTIME in the last 168 hours. Cardiac Enzymes: Recent Labs  Lab 01/26/17 1914  TROPONINI 0.04*   BNP (last 3 results) No results for input(s): PROBNP in the last 8760 hours. HbA1C: No results for input(s): HGBA1C in the last 72 hours. CBG: No results for input(s): GLUCAP in the last 168 hours. Lipid Profile: No results for input(s): CHOL, HDL, LDLCALC, TRIG, CHOLHDL, LDLDIRECT in the last 72 hours. Thyroid Function Tests: No results for input(s): TSH, T4TOTAL, FREET4, T3FREE, THYROIDAB in the last 72 hours. Anemia Panel: No results for input(s): VITAMINB12, FOLATE, FERRITIN, TIBC, IRON, RETICCTPCT in the last 72 hours. Urine analysis: No results found for: COLORURINE, APPEARANCEUR, LABSPEC, [place], GLUCOSEU, HGBUR, BILIRUBINUR, KETONESUR, PROTEINUR, UROBILINOGEN, NITRITE, LEUKOCYTESUR  Radiological Exams on Admission: Dg Chest Port 1  View  Result Date: 01/26/2017 CLINICAL DATA:  Increasing shortness of breath and cough for 3 days. EXAM: PORTABLE CHEST 1 VIEW COMPARISON:  01/29/2016 and prior chest radiograph FINDINGS: The cardiomediastinal silhouette is unremarkable. COPD/emphysema changes again noted. Slightly increased interstitial opacities are identified and may represent mild edema versus interstitial infection. No pleural effusion or pneumothorax. No other significant change identified. IMPRESSION: Slightly increased interstitial opacities which may represent mild edema versus interstitial infection. COPD/emphysema. Electronically Signed   By: Margarette Canada M.D.   On: 01/26/2017 19:29   07/02/2014 echocardiogram ------------------------------------------------------------------- LV EF: 65% -   70%  ------------------------------------------------------------------- Indications:      Chest pain 786.51.  ------------------------------------------------------------------- Study Conclusions  - Left ventricle: The cavity size was normal. Wall thickness was   normal. Systolic function was vigorous. The estimated ejection   fraction was in the range of 65% to 70%. Wall motion was normal;   there were no regional wall motion abnormalities. Left   ventricular diastolic function parameters were normal for the   patient&'s age. - Ventricular septum: The contour showed diastolic flattening and   systolic flattening. - Mitral valve: There was trivial regurgitation. - Right ventricle: The cavity size was moderately dilated. Systolic   function was mildly reduced. - Right atrium: The atrium was mildly dilated. Central venous   pressure (est): 3 mm Hg. - Tricuspid valve: There was trivial regurgitation. - Pulmonary arteries: Systolic pressure could not be accurately   estimated. - Pericardium, extracardiac: There was no pericardial effusion.  Impressions:  - Normal LV wall thickness with LVEF 16-07%, normal  diastolic   function. Trivial mitral regurgitation. Moderate RV enlargement   with mildly reduced contraction. There is LV septal flattening   suggesting increased right heart pressure and volume, although   PASP cannot be accurately estimated.  EKG: Independently reviewed. Vent. rate 124 BPM PR interval * ms QRS duration 89 ms QT/QTc 285/410 ms P-R-T axes 66 102 87 Sinus tachycardia Right axis deviation Abnrm T, consider ischemia, anterolateral lds ST elev, probable normal early repol pattern Baseline wander in lead(s) V4  Assessment/Plan Principal Problem:   Acute respiratory failure with hypercapnia ([place]) 51 year old with severe emphysema, active smoker, malnourished on home O2. He is presenting with a  3-day history of progressively worse dyspnea,  wheezing, productive cough, chills and subjective fevers.  Requiring BiPAP in the ED. Continue supplemental oxygen and BiPAP ventilation. Albuterol 2.5 mg plus ipratropium 0.5 mg via neb every 6 hours. Albuterol 2.5 mg every 4 hours as needed for dyspnea and wheezing. Continue Solu-Medrol 40 mg IVP every 6 hours for 4 doses. Smoking cessation encouraged and information will be provided.  Active Problems:   COPD exacerbation ([place]) As above. Continue supplemental oxygen. Continue scheduled and as needed albuterol. Ipratropium via nebulizer every 6 hours. Solu-Medrol 40 mg IV PB every 6 hours x4 doses.    Community acquired pneumonia Has been having subjective fevers, chills and increased production. Continue supplemental oxygen and bronchodilators. Continue vancomycin. Start ceftriaxone 1 g IVPB every 24 hours. Start azithromycin 500 mg IVPB every 24 hours. Check sputum Gram stain, culture and sensitivity. Check strep pneumonia and Legionella pneumophila urinary antigens. Follow-up blood cultures and sensitivity.    Leukocytosis It seems the patient has some degree of leukocytosis as baseline. In the past 2 years, WBC has  been as high as 42,000. Currently on methylprednisolone, which may increase WBC further. Monitor WBC while in the hospital. Consider outpatient evaluation by hematology.    Thrombocytosis (HCC) Likely reactive, so should improve with above treatment. On Lovenox for DVT prophylaxis. Monitor platelet level.    Tobacco abuse Team replacement therapy ordered. Nurse to provide tobacco cessation information.    Pulmonary nodule Following with Dr. Luan Pulling (pulmonologist)    GERD (gastroesophageal reflux disease) Protonix 40 mg p.o. daily while in the hospital.    Hyponatremia Suspect this may be related to possible RHF. Less likely due to infectious process like Legionella. Received NS bolus in ED. Follow up input and output. Monitor sodium level.    Elevated brain natriuretic peptide (BNP) level   Elevated troponin Normal EF and LV diastolic parameters 0-1/7 years ago. However, no pulmonary pressure was obtained on that study. Minimally elevated troponin likely due to demand ischemia  (Anxiety, hypoxia and beta agonist induced tachycardia). Mild decrease in sodium may be related to RHF or LV dysfunction. Trend troponin level. Will check echocardiogram in the morning.    Hypercholesterolemia Not on medical treatment. Should readdress as an outpatient.    Anxiety Continue alprazolam 1 mg p.o. 3 times daily as needed Continue trazodone 50 mg p.o. at bedtime for insomnia.    Chronic back pain greater than 3 months duration Continue Percocet 11/10/2023 every 4 hours as needed.     DVT prophylaxis: Lovenox SQ. Code Status: Full code. Family Communication: His son was present in the ED. Disposition Plan: Admit to ICU for acute respiratory failure/COPD exacerbation treatment. Consults called:  Admission status: Inpatient/ICU.   Reubin Milan MD [facility] Pager [phone].  If 7PM-7AM, please contact night-coverage [email or web address] Password  [facility]  01/26/2017, 10:10 PM   This document was prepared using Dragon voice recognition software and may contain some unintended errors.

## 2017-01-26 NOTE — Progress Notes (Signed)
Pharmacy Note:  Initial antibiotic(s) regimen of Vancomycin and Zosyn ordered by EDP to treat Sepsis.  CrCl cannot be calculated (Patient's most recent lab result is older than the maximum 21 days allowed.).   Allergies  Allergen Reactions  . Codeine Itching and Nausea Only   Vitals:   01/26/17 1902 01/26/17 1915  BP:  95/68  Pulse: (!) 124 (!) 119  Resp: (!) 23 18  SpO2: (!) 66% 99%   Anti-infectives (From admission, onward)   Start     Dose/Rate Route Frequency Ordered Stop   01/26/17 1915  piperacillin-tazobactam (ZOSYN) IVPB 3.375 g     3.375 g 100 mL/hr over 30 Minutes Intravenous  Once 01/26/17 1909     01/26/17 1915  vancomycin (VANCOCIN) IVPB 1000 mg/200 mL premix     1,000 mg 200 mL/hr over 60 Minutes Intravenous  Once 01/26/17 1909       Plan: Initial dose(s) of Vancomycin and Zosyn X 1 ordered. F/U admission orders for further dosing if therapy continued.  Pricilla Larsson, [facility] 01/26/2017 7:29 PM

## 2017-01-26 NOTE — ED Provider Notes (Signed)
[facility] EMERGENCY DEPARTMENT Provider Note   CSN: [id number] Arrival date & time: 01/26/17  1851     History   Chief Complaint Chief Complaint  Patient presents with  . Shortness of Breath    HPI Cameron Villarreal is a 51 y.o. male.  HPI History of COPD presents with gradual worsening of his shortness of breath of the last week.  He has had productive cough of yellow sputum.  He has had subjective fevers and chills.  Patient states that yesterday he did have right-sided chest pain but this is resolved.  No new lower extremity swelling or pain. Past Medical History:  Diagnosis Date  . COPD (chronic obstructive pulmonary disease) (HCC)    Severe centrilobular emphysema, on home o2  . GERD (gastroesophageal reflux disease)   . Hypercholesterolemia   . Pulmonary nodule 07/04/2011  . S/P colonoscopy March 2012   Normal  . S/P endoscopy March 2012   Reflux esophagitis, no ulcerations    Patient Active Problem List   Diagnosis Date Noted  . Acute on chronic respiratory failure ([place]) 02/04/2015  . Leukocytosis 02/04/2015  . COPD with acute exacerbation ([place]) 12/25/2014  . Malnutrition of moderate degree ([place]) 07/03/2014  . Dehydration 07/02/2014  . Polycythemia 07/01/2014  . Anxiety state 06/24/2013  . Chronic back pain greater than 3 months duration 06/24/2013  . Community acquired pneumonia 06/22/2013  . Respiratory failure, acute ([place]) 06/22/2013  . Chest pain at rest 07/26/2011  . Pulmonary nodule 07/04/2011  . Hyperglycemia, drug-induced 07/04/2011  . Acute respiratory failure ([place]) 07/02/2011  . CAP (community acquired pneumonia) 07/02/2011  . COPD exacerbation ([place]) 07/02/2011  . Tobacco abuse 07/02/2011  . LUQ abdominal pain 06/17/2010  . RECTAL BLEEDING 04/21/2010  . ABDOMINAL PAIN-EPIGASTRIC 04/21/2010    Past Surgical History:  Procedure Laterality Date  . APPENDECTOMY    . CHOLECYSTECTOMY    . HERNIA REPAIR    . KNEE SURGERY    . Right inguinal  hernia repair         Home Medications    Prior to Admission medications   Medication Sig Start Date End Date Taking? Authorizing Provider  albuterol (PROVENTIL) (2.5 MG/3ML) 0.083% nebulizer solution Take 2.5 mg by nebulization every 4 (four) hours as needed for wheezing or shortness of breath.  01/18/17  Yes [provider]  ALPRAZolam Duanne Moron) 1 MG tablet Take 1 mg by mouth 3 (three) times daily.   Yes [provider]  ENSURE (ENSURE) Take 237 mLs by mouth 2 (two) times daily between meals.   Yes [provider]  Fluticasone-Salmeterol (ADVAIR DISKUS) 250-50 MCG/DOSE AEPB Inhale 1 puff into the lungs 2 (two) times daily. 12/27/14  Yes Sinda Du, MD  oxyCODONE-acetaminophen (PERCOCET) 10-325 MG tablet Take 1 tablet by mouth every 4 (four) hours as needed for pain. 02/06/15  Yes Sinda Du, MD  PROAIR HFA 108 [id number] Base) MCG/ACT inhaler Inhale 1-2 puffs into the lungs every 6 (six) hours as needed for wheezing or shortness of breath.  12/10/15  Yes [provider]  traZODone (DESYREL) 50 MG tablet Take 50 mg by mouth at bedtime. 12/10/15  Yes [provider]    Family History Family History  Problem Relation Age of Onset  . Cirrhosis Father        Deceased, ETOH cirrhosis  . Stomach cancer Unknown        Aunt    Social History Social History   Tobacco Use  . Smoking status: Current Every  Day Smoker    Packs/day: 0.50    Years: 30.00    Pack years: 15.00    Types: Cigarettes  . Smokeless tobacco: Never Used  Substance Use Topics  . Alcohol use: No    Alcohol/week: 0.6 oz    Types: 1 Standard drinks or equivalent per week    Frequency: Never    Comment: Occasional  . Drug use: No     Allergies   Codeine   Review of Systems Review of Systems  Constitutional: Positive for chills and fever.  HENT: Negative for congestion.   Respiratory: Positive for cough, shortness of breath and wheezing.   Cardiovascular:  Positive for chest pain. Negative for palpitations and leg swelling.  Gastrointestinal: Negative for abdominal pain, diarrhea, nausea and vomiting.  Musculoskeletal: Negative for back pain, myalgias and neck pain.  Skin: Negative for rash and wound.  Neurological: Negative for weakness, numbness and headaches.  All other systems reviewed and are negative.    Physical Exam Updated Vital Signs BP 113/86   Pulse (!) 112   Temp 100.2 F (37.9 C) (Rectal)   Resp 19   Ht 5\' 8"  (1.727 m)   Wt 50.3 kg (111 lb)   SpO2 99%   BMI 16.88 kg/m   Physical Exam  Constitutional: He appears well-developed. He appears toxic. He appears ill.  Chronically ill-appearing.  Cyanotic  HENT:  Head: Normocephalic and atraumatic.  Mouth/Throat: Oropharynx is clear and moist.  Eyes: EOM are normal. Pupils are equal, round, and reactive to light.  Neck: Normal range of motion. Neck supple.  Cardiovascular: Regular rhythm.  Tachycardia  Pulmonary/Chest:  Diminished air movement in both lung fields.  Increased work of breathing with use of accessory muscles  Abdominal: Soft. Bowel sounds are normal. There is no tenderness. There is no rebound and no guarding.  Musculoskeletal: Normal range of motion. He exhibits no edema or tenderness.  No lower extremity swelling, asymmetry or tenderness.  Neurological:  Patient appears drowsy.  Moving all extremities without focal deficit.  Sensation grossly intact.  Skin: Skin is warm and dry. Capillary refill takes less than 2 seconds. No rash noted. No erythema.  Psychiatric: He has a normal mood and affect. His behavior is normal.  Nursing note and vitals reviewed.    ED Treatments / Results  Labs (all labs ordered are listed, but only abnormal results are displayed) Labs Reviewed  BLOOD GAS, ARTERIAL - Abnormal; Notable for the following components:      Result Value   pH, Arterial 7.338 (*)    pCO2 arterial 59.2 (*)    Acid-Base Excess 5.5 (*)    All  other components within normal limits  COMPREHENSIVE METABOLIC PANEL - Abnormal; Notable for the following components:   Sodium 132 (*)    Chloride 90 (*)    Glucose, Bld 137 (*)    BUN 25 (*)    Total Protein 8.6 (*)    ALT 12 (*)    All other components within normal limits  CBC WITH DIFFERENTIAL/PLATELET - Abnormal; Notable for the following components:   WBC 28.1 (*)    Platelets 547 (*)    Neutro Abs 22.5 (*)    Monocytes Absolute 4.2 (*)    All other components within normal limits  TROPONIN I - Abnormal; Notable for the following components:   Troponin I 0.04 (*)    All other components within normal limits  BRAIN NATRIURETIC PEPTIDE - Abnormal; Notable for the following components:  B Natriuretic Peptide 622.0 (*)    All other components within normal limits  I-STAT CG4 LACTIC ACID, ED - Abnormal; Notable for the following components:   Lactic Acid, Venous 2.21 (*)    All other components within normal limits  CULTURE, BLOOD (ROUTINE X 2)  CULTURE, BLOOD (ROUTINE X 2)  URINALYSIS, ROUTINE W REFLEX MICROSCOPIC  INFLUENZA PANEL BY PCR (TYPE A & B)    EKG  EKG Interpretation  Date/Time:  Thursday January 26 2017 19:02:02 EST Ventricular Rate:  124 PR Interval:    QRS Duration: 89 QT Interval:  285 QTC Calculation: 410 R Axis:   102 Text Interpretation:  Sinus tachycardia Right axis deviation Abnrm T, consider ischemia, anterolateral lds ST elev, probable normal early repol pattern Baseline wander in lead(s) V4 Confirmed by Julianne Rice [phone]) on 01/26/2017 7:15:10 PM       Radiology Dg Chest Port 1 View  Result Date: 01/26/2017 CLINICAL DATA:  Increasing shortness of breath and cough for 3 days. EXAM: PORTABLE CHEST 1 VIEW COMPARISON:  01/29/2016 and prior chest radiograph FINDINGS: The cardiomediastinal silhouette is unremarkable. COPD/emphysema changes again noted. Slightly increased interstitial opacities are identified and may represent mild edema  versus interstitial infection. No pleural effusion or pneumothorax. No other significant change identified. IMPRESSION: Slightly increased interstitial opacities which may represent mild edema versus interstitial infection. COPD/emphysema. Electronically Signed   By: Margarette Canada M.D.   On: 01/26/2017 19:29    Procedures Procedures (including critical care time)  Medications Ordered in ED Medications  albuterol (PROVENTIL,VENTOLIN) solution continuous neb (not administered)  vancomycin (VANCOCIN) IVPB 1000 mg/200 mL premix (1,000 mg Intravenous New Bag/Given 01/26/17 1933)  morphine 4 MG/ML injection 4 mg (not administered)  methylPREDNISolone sodium succinate (SOLU-MEDROL) 125 mg/2 mL injection 125 mg (125 mg Intravenous Given 01/26/17 1918)  sodium chloride 0.9 % bolus 1,000 mL (1,000 mLs Intravenous New Bag/Given 01/26/17 1933)    And  sodium chloride 0.9 % bolus 500 mL (0 mLs Intravenous Stopped 01/26/17 2005)    And  sodium chloride 0.9 % bolus 250 mL (0 mLs Intravenous Stopped 01/26/17 2005)  piperacillin-tazobactam (ZOSYN) IVPB 3.375 g (0 g Intravenous Stopped 01/26/17 1955)   CRITICAL CARE Performed by: Julianne Rice Total critical care time: 40 minutes Critical care time was exclusive of separately billable procedures and treating other patients. Critical care was necessary to treat or prevent imminent or life-threatening deterioration. Critical care was time spent personally by me on the following activities: development of treatment plan with patient and/or surrogate as well as nursing, discussions with consultants, evaluation of patient's response to treatment, examination of patient, obtaining history from patient or surrogate, ordering and performing treatments and interventions, ordering and review of laboratory studies, ordering and review of radiographic studies, pulse oximetry and re-evaluation of patient's condition.  Initial Impression / Assessment and Plan / ED Course    I have reviewed the triage vital signs and the nursing notes.  Pertinent labs & imaging results that were available during my care of the patient were reviewed by me and considered in my medical decision making (see chart for details).     Hypoxic into the 60s.  Placed on nonrebreather initially with improvement of O2 sats.  Patient's color also improved.  Became more alert.  Given tachycardia, hypotension, and infectious symptoms will treat for likely sepsis.  Start IV fluids and broad spectrum antibiotics.  Also place patient on BiPAP and continue to monitor closely.  Patient states he is feeling much  better on BiPAP.  Appears more comfortable.  Discussed with hospitalist who will admit to stepdown bed. Final Clinical Impressions(s) / ED Diagnoses   Final diagnoses:  COPD exacerbation ([place])  Sepsis, due to unspecified organism [facility])    ED Discharge Orders    None       Julianne Rice, MD 01/26/17 2025

## 2017-01-26 NOTE — ED Notes (Signed)
CRITICAL VALUE ALERT  Critical Value: Lactic Acid, Venous 2.21  Date & Time Notied: 01/26/17 at 2010  Provider Notified: Lita Mains EDP  Orders Received/Actions taken: No orders at this time

## 2017-01-26 NOTE — ED Notes (Signed)
CRITICAL VALUE ALERT  Critical Value:  Troponin 0.04  Date & Time Notied:  01/26/17 at 2012  Provider Notified: Lita Mains EDP  Orders Received/Actions taken: No orders at this time

## 2017-01-26 NOTE — ED Triage Notes (Signed)
Pt reports SOB and yellow productive cough started 3 days ago. Used a nebulizer at home approx 2 hours ago without relief. Accessory muscle use noted. Pt uses at home O2 as needed.

## 2017-01-27 ENCOUNTER — Inpatient Hospital Stay (HOSPITAL_COMMUNITY): Payer: Medicare Other

## 2017-01-27 DIAGNOSIS — M549 Dorsalgia, unspecified: Secondary | ICD-10-CM

## 2017-01-27 DIAGNOSIS — R9431 Abnormal electrocardiogram [ECG] [EKG]: Secondary | ICD-10-CM

## 2017-01-27 DIAGNOSIS — J441 Chronic obstructive pulmonary disease with (acute) exacerbation: Secondary | ICD-10-CM

## 2017-01-27 DIAGNOSIS — E7800 Pure hypercholesterolemia, unspecified: Secondary | ICD-10-CM

## 2017-01-27 DIAGNOSIS — R911 Solitary pulmonary nodule: Secondary | ICD-10-CM

## 2017-01-27 DIAGNOSIS — D72829 Elevated white blood cell count, unspecified: Secondary | ICD-10-CM

## 2017-01-27 DIAGNOSIS — E871 Hypo-osmolality and hyponatremia: Secondary | ICD-10-CM

## 2017-01-27 DIAGNOSIS — R748 Abnormal levels of other serum enzymes: Secondary | ICD-10-CM

## 2017-01-27 DIAGNOSIS — Z720 Tobacco use: Secondary | ICD-10-CM

## 2017-01-27 DIAGNOSIS — G8929 Other chronic pain: Secondary | ICD-10-CM

## 2017-01-27 DIAGNOSIS — R7989 Other specified abnormal findings of blood chemistry: Secondary | ICD-10-CM

## 2017-01-27 DIAGNOSIS — D473 Essential (hemorrhagic) thrombocythemia: Secondary | ICD-10-CM

## 2017-01-27 DIAGNOSIS — I272 Pulmonary hypertension, unspecified: Secondary | ICD-10-CM

## 2017-01-27 DIAGNOSIS — J189 Pneumonia, unspecified organism: Secondary | ICD-10-CM

## 2017-01-27 LAB — BASIC METABOLIC PANEL
Anion gap: 8 (ref 5–15)
BUN: 14 mg/dL (ref 6–20)
CHLORIDE: 92 mmol/L — AB (ref 101–111)
CO2: 31 mmol/L (ref 22–32)
Calcium: 8.4 mg/dL — ABNORMAL LOW (ref 8.9–10.3)
Creatinine, Ser: 0.46 mg/dL — ABNORMAL LOW (ref 0.61–1.24)
Glucose, Bld: 160 mg/dL — ABNORMAL HIGH (ref 65–99)
Potassium: 4.9 mmol/L (ref 3.5–5.1)
Sodium: 131 mmol/L — ABNORMAL LOW (ref 135–145)

## 2017-01-27 LAB — CBC WITH DIFFERENTIAL/PLATELET
Basophils Absolute: 0 10*3/uL (ref 0.0–0.1)
Basophils Relative: 0 %
Eosinophils Absolute: 0 10*3/uL (ref 0.0–0.7)
Eosinophils Relative: 0 %
HEMATOCRIT: 43.7 % (ref 39.0–52.0)
HEMOGLOBIN: 13.3 g/dL (ref 13.0–17.0)
LYMPHS ABS: 0.8 10*3/uL (ref 0.7–4.0)
LYMPHS PCT: 4 %
MCH: 28.7 pg (ref 26.0–34.0)
MCHC: 30.4 g/dL (ref 30.0–36.0)
MCV: 94.2 fL (ref 78.0–100.0)
MONO ABS: 0.5 10*3/uL (ref 0.1–1.0)
MONOS PCT: 2 %
NEUTROS ABS: 20.2 10*3/uL — AB (ref 1.7–7.7)
NEUTROS PCT: 94 %
Platelets: 484 10*3/uL — ABNORMAL HIGH (ref 150–400)
RBC: 4.64 MIL/uL (ref 4.22–5.81)
RDW: 14.1 % (ref 11.5–15.5)
WBC: 21.5 10*3/uL — ABNORMAL HIGH (ref 4.0–10.5)

## 2017-01-27 LAB — TROPONIN I
TROPONIN I: 0.03 ng/mL — AB (ref <0.03)
TROPONIN I: 0.03 ng/mL — AB (ref <0.03)

## 2017-01-27 LAB — LACTIC ACID, PLASMA: LACTIC ACID, VENOUS: 1.4 mmol/L (ref 0.5–1.9)

## 2017-01-27 LAB — MRSA PCR SCREENING: MRSA by PCR: NEGATIVE

## 2017-01-27 MED ORDER — OXYCODONE-ACETAMINOPHEN 10-325 MG PO TABS: 1.0000 | ORAL_TABLET | ORAL | Status: DC | PRN

## 2017-01-27 MED ORDER — VANCOMYCIN HCL IN DEXTROSE 750-5 MG/150ML-% IV SOLN
750.0000 mg | Freq: Two times a day (BID) | INTRAVENOUS | Status: DC
  Administered 2017-01-27 – 2017-01-28 (×4): 750 mg via INTRAVENOUS
  Filled 2017-01-27 (×7): qty 150

## 2017-01-27 MED ORDER — ALPRAZOLAM 0.5 MG PO TABS
1.0000 mg | ORAL_TABLET | Freq: Three times a day (TID) | ORAL | Status: DC
  Administered 2017-01-27 – 2017-01-29 (×7): 1 mg via ORAL
  Filled 2017-01-27 (×7): qty 2

## 2017-01-27 MED ORDER — OXYCODONE-ACETAMINOPHEN 5-325 MG PO TABS
1.0000 | ORAL_TABLET | ORAL | Status: DC | PRN
  Administered 2017-01-27 – 2017-01-28 (×3): 1 via ORAL
  Filled 2017-01-27 (×3): qty 1

## 2017-01-27 MED ORDER — IPRATROPIUM-ALBUTEROL 0.5-2.5 (3) MG/3ML IN SOLN
3.0000 mL | Freq: Four times a day (QID) | RESPIRATORY_TRACT | Status: DC
  Administered 2017-01-27 – 2017-01-29 (×9): 3 mL via RESPIRATORY_TRACT
  Filled 2017-01-27 (×9): qty 3

## 2017-01-27 MED ORDER — DEXTROSE 5 % IV SOLN
1.0000 g | INTRAVENOUS | Status: DC
  Administered 2017-01-27 – 2017-01-29 (×3): 1 g via INTRAVENOUS
  Filled 2017-01-27 (×5): qty 10

## 2017-01-27 MED ORDER — NICOTINE 14 MG/24HR TD PT24
14.0000 mg | MEDICATED_PATCH | Freq: Every day | TRANSDERMAL | Status: DC
  Administered 2017-01-27 – 2017-01-29 (×3): 14 mg via TRANSDERMAL
  Filled 2017-01-27 (×3): qty 1

## 2017-01-27 MED ORDER — IOPAMIDOL (ISOVUE-300) INJECTION 61%
INTRAVENOUS | Status: AC
  Filled 2017-01-27: qty 30

## 2017-01-27 MED ORDER — ALBUTEROL SULFATE (2.5 MG/3ML) 0.083% IN NEBU: 2.5000 mg | INHALATION_SOLUTION | RESPIRATORY_TRACT | Status: DC | PRN

## 2017-01-27 MED ORDER — IOPAMIDOL (ISOVUE-300) INJECTION 61%
100.0000 mL | Freq: Once | INTRAVENOUS | Status: AC | PRN
  Administered 2017-01-27: 100 mL via INTRAVENOUS

## 2017-01-27 MED ORDER — ADULT MULTIVITAMIN W/MINERALS CH
1.0000 | ORAL_TABLET | Freq: Every day | ORAL | Status: DC
  Administered 2017-01-28 – 2017-01-29 (×2): 1 via ORAL
  Filled 2017-01-27 (×2): qty 1

## 2017-01-27 MED ORDER — TRAZODONE HCL 50 MG PO TABS
50.0000 mg | ORAL_TABLET | Freq: Every day | ORAL | Status: DC
  Administered 2017-01-27 – 2017-01-28 (×2): 50 mg via ORAL
  Filled 2017-01-27 (×2): qty 1

## 2017-01-27 MED ORDER — OXYCODONE HCL 5 MG PO TABS
5.0000 mg | ORAL_TABLET | ORAL | Status: DC | PRN
  Administered 2017-01-27 (×2): 5 mg via ORAL
  Filled 2017-01-27 (×2): qty 1

## 2017-01-27 MED ORDER — ENOXAPARIN SODIUM 40 MG/0.4ML ~~LOC~~ SOLN
40.0000 mg | SUBCUTANEOUS | Status: DC
  Administered 2017-01-27 – 2017-01-29 (×3): 40 mg via SUBCUTANEOUS
  Filled 2017-01-27 (×3): qty 0.4

## 2017-01-27 MED ORDER — IPRATROPIUM BROMIDE 0.02 % IN SOLN: 0.5000 mg | Freq: Four times a day (QID) | RESPIRATORY_TRACT | Status: DC

## 2017-01-27 MED ORDER — ENSURE ENLIVE PO LIQD
237.0000 mL | Freq: Two times a day (BID) | ORAL | Status: DC
Start: 2017-01-27 — End: 2017-01-29
  Administered 2017-01-27 – 2017-01-29 (×3): 237 mL via ORAL

## 2017-01-27 MED ORDER — METHYLPREDNISOLONE SODIUM SUCC 40 MG IJ SOLR
40.0000 mg | Freq: Four times a day (QID) | INTRAMUSCULAR | Status: AC
  Administered 2017-01-27 (×4): 40 mg via INTRAVENOUS
  Filled 2017-01-27 (×4): qty 1

## 2017-01-27 MED ORDER — ALBUTEROL SULFATE (2.5 MG/3ML) 0.083% IN NEBU: 2.5000 mg | INHALATION_SOLUTION | Freq: Four times a day (QID) | RESPIRATORY_TRACT | Status: DC

## 2017-01-27 MED ORDER — DEXTROSE 5 % IV SOLN
500.0000 mg | INTRAVENOUS | Status: DC
  Administered 2017-01-27 – 2017-01-29 (×3): 500 mg via INTRAVENOUS
  Filled 2017-01-27 (×5): qty 500

## 2017-01-27 NOTE — Consult Note (Signed)
CARDIOLOGY CONSULT NOTE    Patient ID: Cameron Villarreal; [id number]; 1965/10/16   Admit date: 01/26/2017 Date of Consult: 01/27/2017  Primary Care Provider: Sinda Du, MD Primary Cardiologist: Dr. Domenic Villarreal (not seen since 2016)  Patient Profile:   Cameron Villarreal is a 51 y.o. male with a hx of COPD, hypertension, hypercholesterolemia, anxiety, GERD, ongoing tobacco abuse who is being seen today for the evaluation of abnormal EKG and positive troponin at the request of Dr. Luan Villarreal.  History of Present Illness:   Cameron Villarreal presented to the emergency room with complaints of pleuritic chest pain, productive coughing with wheezing, yellowish sputum, and worsening dyspnea.  He also has some evidence of anxiety and complaint of palpitation.  Patient states chest pain was elicited with coughing and taking deep breaths.  He states his chest has been sore from worsening dyspnea and coughing over the last week.  He endorses an unintentional 20 pound weight loss over the last couple months.  Arrival to the emergency room 95/68, heart rate 119, O2 sat 99%, he was febrile with a temperature 100.2.  EKG revealed sinus tachycardia heart rate of 133 bpm with significant artifact.  Follow-up EKG revealed sinus tachycardia with T wave inversion anteriorly, unchanged from prior EKG in May 2018.  Follow-up EKG, lactic acid elevated at 1.96.  He was placed on BiPAP.  Labs on arrival sodium 132, potassium 4.6, glucose 137, creatinine 0.65.  Leukocytosis was noted with white blood cells of 28,000, he was not found to be anemic, platelets 547.  Troponin 0 0.04, 0.03 and 0.03 respectively.  Blood cultures have been drawn.  No growth over the last 12 hours.  Of note, he was positive for MRSA in 2016.  Chest x-ray revealed increased interstitial opacities, edema versus infection.  She was treated with albuterol inhaler and nebulizer treatment, IV Solu-Medrol, begun on antibiotics with vancomycin and Zosyn, also  given magnesium 2 g IV x1 (Magnesium 2.0).   He has been diagnosed with community-acquired pneumonia, but due to elevated troponin, abnormal EKG, we are asked for cardiology recommendations in the setting of multiple cardiovascular risk factors to include hypertension, hyperlipidemia, tobacco abuse, age, and male gender, with complaints of chest pain.    Past Medical History:  Diagnosis Date  . Anxiety   . Back pain   . COPD (chronic obstructive pulmonary disease) (HCC)    Severe centrilobular emphysema, on home o2  . GERD (gastroesophageal reflux disease)   . Hypercholesterolemia   . Pulmonary nodule 07/04/2011  . S/P colonoscopy March 2012   Normal  . S/P endoscopy March 2012   Reflux esophagitis, no ulcerations    Past Surgical History:  Procedure Laterality Date  . APPENDECTOMY    . CHOLECYSTECTOMY    . HERNIA REPAIR    . KNEE SURGERY    . Right inguinal hernia repair       Home Medications:  Prior to Admission medications   Medication Sig Start Date End Date Taking? Authorizing Provider  albuterol (PROVENTIL) (2.5 MG/3ML) 0.083% nebulizer solution Take 2.5 mg by nebulization every 4 (four) hours as needed for wheezing or shortness of breath.  01/18/17  Yes [provider]  ALPRAZolam Duanne Moron) 1 MG tablet Take 1 mg by mouth 3 (three) times daily.   Yes [provider]  ENSURE (ENSURE) Take 237 mLs by mouth 2 (two) times daily between meals.   Yes [provider]  Fluticasone-Salmeterol (ADVAIR DISKUS) 250-50 MCG/DOSE AEPB Inhale 1 puff into the  lungs 2 (two) times daily. 12/27/14  Yes Cameron Du, MD  oxyCODONE-acetaminophen (PERCOCET) 10-325 MG tablet Take 1 tablet by mouth every 4 (four) hours as needed for pain. 02/06/15  Yes Cameron Du, MD  PROAIR HFA 108 [phone] Base) MCG/ACT inhaler Inhale 1-2 puffs into the lungs every 6 (six) hours as needed for wheezing or shortness of breath.  12/10/15  Yes [provider]  traZODone (DESYREL)  50 MG tablet Take 50 mg by mouth at bedtime. 12/10/15  Yes [provider]    Inpatient Medications: Scheduled Meds: . ALPRAZolam  1 mg Oral TID  . enoxaparin (LOVENOX) injection  40 mg Subcutaneous Q24H  . feeding supplement (ENSURE ENLIVE)  237 mL Oral BID BM  . ipratropium-albuterol  3 mL Nebulization Q6H WA  . methylPREDNISolone (SOLU-MEDROL) injection  40 mg Intravenous Q6H  . nicotine  14 mg Transdermal Daily  . traZODone  50 mg Oral QHS   Continuous Infusions: . albuterol 10 mg/hr (01/26/17 2038)  . azithromycin Stopped (01/27/17 0320)  . cefTRIAXone (ROCEPHIN)  IV Stopped (01/27/17 0246)  . vancomycin     PRN Meds: albuterol, LORazepam, oxyCODONE-acetaminophen **AND** oxyCODONE  Allergies:    Allergies  Allergen Reactions  . Codeine Itching and Nausea Only    Social History:   Social History   Socioeconomic History  . Marital status: Single    Spouse name: Not on file  . Number of children: Not on file  . Years of education: Not on file  . Highest education level: Not on file  Social Needs  . Financial resource strain: Not on file  . Food insecurity - worry: Not on file  . Food insecurity - inability: Not on file  . Transportation needs - medical: Not on file  . Transportation needs - non-medical: Not on file  Occupational History  . Not on file  Tobacco Use  . Smoking status: Current Every Day Smoker    Packs/day: 0.50    Years: 30.00    Pack years: 15.00    Types: Cigarettes  . Smokeless tobacco: Never Used  Substance and Sexual Activity  . Alcohol use: No    Alcohol/week: 0.6 oz    Types: 1 Standard drinks or equivalent per week    Frequency: Never    Comment: Occasional  . Drug use: No  . Sexual activity: No  Other Topics Concern  . Not on file  Social History Narrative  . Not on file    Family History:    Family History  Problem Relation Age of Onset  . Cirrhosis Father        Deceased, ETOH cirrhosis  . Alcoholism Father     . Depression Brother   . Stomach cancer Unknown        Aunt     ROS:  Please see the history of present illness.  ROS  All other ROS reviewed and negative.     Physical Exam/Data:   Vitals:   01/27/17 0715 01/27/17 0730 01/27/17 0739 01/27/17 0800  BP: 115/87 111/87  105/83  Pulse: 90 89 87 87  Resp: 18 15 12 13   Temp:   98.1 F (36.7 C)   TempSrc:   Oral   SpO2: 95% 93% 93% 95%  Weight:      Height:        Intake/Output Summary (Last 24 hours) at 01/27/2017 0830 Last data filed at 01/27/2017 0600 Gross per 24 hour  Intake 2200 ml  Output 400 ml  Net 1800 ml   Filed Weights   01/26/17 1859  Weight: 111 lb (50.3 kg)   Body mass index is 16.88 kg/m.  General:  Well nourished, well developed, in no acute distress, thin, disheveled, HEENT: normal.  Poor dentition.  Lymph: no adenopathy Neck: no JVD Endocrine:  No thryomegaly Vascular: No carotid bruits; FA pulses 2+ bilaterally without bruits  Cardiac:  normal S1, S2; RRR, tachycardic; no murmur  Lungs: Bilateral crackles, frequent coughing with inspiration. Abd: soft, nontender, no hepatomegaly  Ext: no edema Musculoskeletal:  No deformities, BUE and BLE strength normal and equal Skin: warm and dry  Neuro:  CNs 2-12 intact, no focal abnormalities noted Psych:  Normal affect   EKG:  The EKG was personally reviewed and demonstrates: Sinus tachycardia, heart rate of 133 bpm, again artifact. Telemetry:  Telemetry was personally reviewed and demonstrates sinus tachycardia, heart rates in the 90s.  Relevant CV Studies: NM Study 07/15/2014  There was no ST segment deviation noted during stress.  The study is normal.  This is a low risk study.  The left ventricular ejection fraction is normal (55-65%).  There is RV enlargement noted  Echocardiogram 07/01/2016 Left ventricle: The cavity size was normal. Wall thickness was   normal. Systolic function was vigorous. The estimated ejection   fraction was in the  range of 65% to 70%. Wall motion was normal;   there were no regional wall motion abnormalities. Left   ventricular diastolic function parameters were normal for the   patient&'s age. - Ventricular septum: The contour showed diastolic flattening and   systolic flattening. - Mitral valve: There was trivial regurgitation. - Right ventricle: The cavity size was moderately dilated. Systolic   function was mildly reduced. - Right atrium: The atrium was mildly dilated. Central venous   pressure (est): 3 mm Hg. - Tricuspid valve: There was trivial regurgitation. - Pulmonary arteries: Systolic pressure could not be accurately   estimated. - Pericardium, extracardiac: There was no pericardial effusion.  Laboratory Data:  Chemistry Recent Labs  Lab 01/26/17 1914 01/27/17 0649  NA 132* 131*  K 4.6 4.9  CL 90* 92*  CO2 28 31  GLUCOSE 137* 160*  BUN 25* 14  CREATININE 0.65 0.46*  CALCIUM 9.7 8.4*  GFRNONAA >60 >60  GFRAA >60 >60  ANIONGAP 14 8    Recent Labs  Lab 01/26/17 1914  PROT 8.6*  ALBUMIN 3.7  AST 19  ALT 12*  ALKPHOS 123  BILITOT 0.4   Hematology Recent Labs  Lab 01/26/17 1914 01/27/17 0649  WBC 28.1* 21.5*  RBC 5.27 4.64  HGB 14.9 13.3  HCT 47.9 43.7  MCV 90.9 94.2  MCH 28.3 28.7  MCHC 31.1 30.4  RDW 14.3 14.1  PLT 547* 484*   Cardiac Enzymes Recent Labs  Lab 01/26/17 1914 01/27/17 0051 01/27/17 0649  TROPONINI 0.04* 0.03* 0.03*   No results for input(s): TROPIPOC in the last 168 hours.  BNP Recent Labs  Lab 01/26/17 1914  BNP 622.0*    DDimer No results for input(s): DDIMER in the last 168 hours.  Radiology/Studies:  Dg Chest Port 1 View  Result Date: 01/26/2017 CLINICAL DATA:  Increasing shortness of breath and cough for 3 days. EXAM: PORTABLE CHEST 1 VIEW COMPARISON:  01/29/2016 and prior chest radiograph FINDINGS: The cardiomediastinal silhouette is unremarkable. COPD/emphysema changes again noted. Slightly increased interstitial  opacities are identified and may represent mild edema versus interstitial infection. No pleural effusion or pneumothorax. No other significant change identified.  IMPRESSION: Slightly increased interstitial opacities which may represent mild edema versus interstitial infection. COPD/emphysema. Electronically Signed   By: Margarette Canada M.D.   On: 01/26/2017 19:29    Assessment and Plan:   1.  Chest pain with positive troponin: Likelt and ischemia in the setting of pneumonia, and hypoxia.  EKG is unchanged from 2016.  Troponin relatively flat at 0.03 x 2.  Denies any further chest pain.  He does have some soreness with coughing and chest pain was elicited during coughing episodes.  He does have cardiovascular risk factors for CAD and has not been seen as an outpatient on follow-up after originally being seen on consultation in 2016 for similar complaint.  Once patient is clinically stable and recovered from pneumonia, consider outpatient stress Myoview for diagnostic prognostic purposes due to risk factors.  Will order echocardiogram for changes in LV function.  2.  Hypertension: Blood pressure is soft.  Is currently not on any antihypertensive medications.  3.  Hypercholesterolemia: Currently not on statin therapy.  Fasting lipids and LFTs for risk stratification and current status.  4.  Community-acquired pneumonia: Currently on IV antibiotic therapy followed by Dr. Luan Villarreal.  5.  COPD: Worsening breathing status in the setting of #4.  Unfortunately continues to smoke.  Has had 20 pound unintentional weight loss.  Dr. Luan Villarreal is ordering CAT scan.  6.  Ongoing tobacco abuse: Recent states he smokes 6 cigarettes a day.  Smoking cessation is strongly recommended.   For questions or updates, please contact [place] Please consult [email or web address] for contact info under Cardiology/STEMI.   Signed, Phill Myron. West Pugh, ANP, AACC  01/27/2017 8:30 AM   The patient was seen and examined, and I  agree with the history, physical exam, assessment and plan as documented above, with modifications as noted below. I have also personally reviewed all relevant documentation, old records, labs, and both radiographic and cardiovascular studies. I have also independently interpreted old and new ECG's.  51 year old male with severe oxygen dependent COPD, tobacco abuse, malnourishment, chronic back pain, and hyperlipidemia admitted with acute hypoxic, hypercapnic respiratory failure deemed secondary to COPD exacerbation and community-acquired pneumonia.  He has also had about 20 pounds weight loss. Troponins were minimally elevated at 0.04 and 0.03. BNP was elevated to 622. ECG which I personally interpreted demonstrated sinus rhythm with precordial T wave inversions suggestive of anterior ischemia.  These findings were seen on ECG 1 year ago but not prior to that. Chest x-ray showed slightly increased interstitial opacities which may represent mild edema versus interstitial infection. Nuclear stress test reviewed above from 2016 it did not demonstrate any significant myocardial ischemia or scar. Most recent echocardiogram performed on 07/02/14 which I reviewed demonstrated vigorous left ventricular systolic function and normal regional wall motion, LVEF 65-70%.  The ventricular symptom did show diastolic and systolic flattening suggestive of right ventricular pressure and volume overload.  Pulmonary pressures could not be accurately estimated.  The patient tells me he has had chest pain for about 5 years.  In most recent months, he notices it with heavy breathing.  This can occur both with exertion and at rest.  He denies any current chest pain. He has been on BiPAP and is currently on nasal cannula.  Recommendations: Given his multiple cardiovascular risk factors and precordial T wave inversions, I would not be surprised if he had hemodynamically significant coronary artery disease.  That being said,  troponins are minimally elevated and BNP is 622.  This can be explained  by acute hypercapnic, hypoxic respiratory failure with commune acquired pneumonia and COPD exacerbation.  He also likely has significant pulmonary hypertension as evidenced by echocardiogram in 2016 which can also lead to a troponin elevation and BNP elevation.  I will reviewed the echocardiogram which has been ordered and yet to be performed. With regards to ischemic testing, I would recommend an outpatient dobutamine Myoview nuclear stress test.  I would also recommend aspirin 81 mg and Lipitor 40 mg, the latter for its pleiotropic effects, at the time of discharge.  Kate Sable, MD, [facility]  01/27/2017 10:20 AM

## 2017-01-27 NOTE — Progress Notes (Addendum)
Subjective: He was admitted with community-acquired pneumonia and acute on chronic hypoxic and probably hypercapnic respiratory failure.  He was placed on BiPAP but now is on nasal cannula.  He says he is hungry.  He is complaining of simply feeling bad.  He is still coughing and coughing up sputum.  Objective: Vital signs in last 24 hours: Temp:  [98.1 F (36.7 C)-100.2 F (37.9 C)] 98.1 F (36.7 C) (12/21 0739) Pulse Rate:  [86-124] 87 (12/21 0800) Resp:  [12-26] 13 (12/21 0800) BP: (95-115)/(68-89) 105/83 (12/21 0800) SpO2:  [66 %-100 %] 95 % (12/21 0800) FiO2 (%):  [40 %] 40 % (12/20 1943) Weight:  [50.3 kg (111 lb)] 50.3 kg (111 lb) (12/20 1859) Weight change:  Last BM Date: (PTA)  Intake/Output from previous day: 12/20 0701 - 12/21 0700 In: 2200 [IV Piggyback:2200] Out: 400 [Urine:400]  PHYSICAL EXAM General appearance: alert and mild distress Resp: rhonchi bilaterally Cardio: regular rate and rhythm, S1, S2 normal, no murmur, click, rub or gallop GI: soft, non-tender; bowel sounds normal; no masses,  no organomegaly Extremities: extremities normal, atraumatic, no cyanosis or edema He is very thin  Lab Results:  Results for orders placed or performed during the hospital encounter of 01/26/17 (from the past 48 hour(s))  Comprehensive metabolic panel     Status: Abnormal   Collection Time: 01/26/17  7:14 PM  Result Value Ref Range   Sodium 132 (L) 135 - 145 mmol/L   Potassium 4.6 3.5 - 5.1 mmol/L   Chloride 90 (L) 101 - 111 mmol/L   CO2 28 22 - 32 mmol/L   Glucose, Bld 137 (H) 65 - 99 mg/dL   BUN 25 (H) 6 - 20 mg/dL   Creatinine, Ser 0.65 0.61 - 1.24 mg/dL   Calcium 9.7 8.9 - 10.3 mg/dL   Total Protein 8.6 (H) 6.5 - 8.1 g/dL   Albumin 3.7 3.5 - 5.0 g/dL   AST 19 15 - 41 U/L   ALT 12 (L) 17 - 63 U/L   Alkaline Phosphatase 123 38 - 126 U/L   Total Bilirubin 0.4 0.3 - 1.2 mg/dL   GFR calc non Af Amer >60 >60 mL/min   GFR calc Af Amer >60 >60 mL/min    Comment:  (NOTE) The eGFR has been calculated using the CKD EPI equation. This calculation has not been validated in all clinical situations. eGFR's persistently <60 mL/min signify possible Chronic Kidney Disease.    Anion gap 14 5 - 15  CBC WITH DIFFERENTIAL     Status: Abnormal   Collection Time: 01/26/17  7:14 PM  Result Value Ref Range   WBC 28.1 (H) 4.0 - 10.5 K/uL    Comment: WHITE COUNT CONFIRMED ON SMEAR   RBC 5.27 4.22 - 5.81 MIL/uL   Hemoglobin 14.9 13.0 - 17.0 g/dL   HCT 47.9 39.0 - 52.0 %   MCV 90.9 78.0 - 100.0 fL   MCH 28.3 26.0 - 34.0 pg   MCHC 31.1 30.0 - 36.0 g/dL   RDW 14.3 11.5 - 15.5 %   Platelets 547 (H) 150 - 400 K/uL    Comment: SPECIMEN CHECKED FOR CLOTS PLATELET COUNT CONFIRMED BY SMEAR    Neutrophils Relative % 80 %   Lymphocytes Relative 5 %   Monocytes Relative 15 %   Eosinophils Relative 0 %   Basophils Relative 0 %   Neutro Abs 22.5 (H) 1.7 - 7.7 K/uL   Lymphs Abs 1.4 0.7 - 4.0 K/uL   Monocytes Absolute  4.2 (H) 0.1 - 1.0 K/uL   Eosinophils Absolute 0.0 0.0 - 0.7 K/uL   Basophils Absolute 0.0 0.0 - 0.1 K/uL   WBC Morphology MILD LEFT SHIFT (1-5% METAS, OCC MYELO, OCC BANDS)     Comment: SMUDGE CELLS  Blood Culture (routine x 2)     Status: None (Preliminary result)   Collection Time: 01/26/17  7:14 PM  Result Value Ref Range   Specimen Description LEFT ANTECUBITAL    Special Requests      BOTTLES DRAWN AEROBIC AND ANAEROBIC Blood Culture adequate volume   Culture NO GROWTH < 12 HOURS    Report Status PENDING   Blood Culture (routine x 2)     Status: None (Preliminary result)   Collection Time: 01/26/17  7:14 PM  Result Value Ref Range   Specimen Description BLOOD RIGHT FOREARM    Special Requests      BOTTLES DRAWN AEROBIC AND ANAEROBIC Blood Culture adequate volume   Culture NO GROWTH < 12 HOURS    Report Status PENDING   Troponin I     Status: Abnormal   Collection Time: 01/26/17  7:14 PM  Result Value Ref Range   Troponin I 0.04 (HH) <0.03  ng/mL    Comment: CRITICAL RESULT CALLED TO, READ BACK BY AND VERIFIED WITH: LONG,H AT 2005 ON 12.20.2018 BY ISLEY,B   Brain natriuretic peptide     Status: Abnormal   Collection Time: 01/26/17  7:14 PM  Result Value Ref Range   B Natriuretic Peptide 622.0 (H) 0.0 - 100.0 pg/mL  I-Stat CG4 Lactic Acid, ED  (not at  [facility])     Status: Abnormal   Collection Time: 01/26/17  7:22 PM  Result Value Ref Range   Lactic Acid, Venous 2.21 (HH) 0.5 - 1.9 mmol/L  Blood gas, arterial ([facility] & [facility] ONLY)     Status: Abnormal   Collection Time: 01/26/17  7:30 PM  Result Value Ref Range   FIO2 40.00    Delivery systems BILEVEL POSITIVE AIRWAY PRESSURE    LHR 10.0 resp/min   Inspiratory PAP 12    Expiratory PAP 6    pH, Arterial 7.338 (L) 7.350 - 7.450   pCO2 arterial 59.2 (H) 32.0 - 48.0 mmHg   pO2, Arterial 96.5 83.0 - 108.0 mmHg   Bicarbonate 27.8 20.0 - 28.0 mmol/L   Acid-Base Excess 5.5 (H) 0.0 - 2.0 mmol/L   O2 Saturation 96.1 %   Collection site RIGHT RADIAL    Drawn by [id number]    Sample type ARTERIAL    Allens test (pass/fail) PASS PASS  Influenza panel by PCR (type A & B)     Status: None   Collection Time: 01/26/17  8:16 PM  Result Value Ref Range   Influenza A By PCR NEGATIVE NEGATIVE   Influenza B By PCR NEGATIVE NEGATIVE    Comment: (NOTE) The Xpert Xpress Flu assay is intended as an aid in the diagnosis of  influenza and should not be used as a sole basis for treatment.  This  assay is FDA approved for nasopharyngeal swab specimens only. Nasal  washings and aspirates are unacceptable for Xpert Xpress Flu testing.   Magnesium     Status: None   Collection Time: 01/26/17  8:42 PM  Result Value Ref Range   Magnesium 2.1 1.7 - 2.4 mg/dL  Phosphorus     Status: None   Collection Time: 01/26/17  8:50 PM  Result Value Ref Range   Phosphorus 3.7 2.5 -  4.6 mg/dL  CG4 I-STAT (Lactic acid)     Status: Abnormal   Collection Time: 01/26/17  9:12 PM  Result Value Ref Range   Lactic Acid,  Venous 1.96 (H) 0.5 - 1.9 mmol/L  Troponin I (q 6hr x 3)     Status: Abnormal   Collection Time: 01/27/17 12:51 AM  Result Value Ref Range   Troponin I 0.03 (HH) <0.03 ng/mL    Comment: CRITICAL VALUE NOTED.  VALUE IS CONSISTENT WITH PREVIOUSLY REPORTED AND CALLED VALUE.  Lactic acid, plasma     Status: None   Collection Time: 01/27/17 12:51 AM  Result Value Ref Range   Lactic Acid, Venous 1.4 0.5 - 1.9 mmol/L  Troponin I (q 6hr x 3)     Status: Abnormal   Collection Time: 01/27/17  6:49 AM  Result Value Ref Range   Troponin I 0.03 (HH) <0.03 ng/mL    Comment: CRITICAL VALUE NOTED.  VALUE IS CONSISTENT WITH PREVIOUSLY REPORTED AND CALLED VALUE.  CBC WITH DIFFERENTIAL     Status: Abnormal   Collection Time: 01/27/17  6:49 AM  Result Value Ref Range   WBC 21.5 (H) 4.0 - 10.5 K/uL   RBC 4.64 4.22 - 5.81 MIL/uL   Hemoglobin 13.3 13.0 - 17.0 g/dL   HCT 43.7 39.0 - 52.0 %   MCV 94.2 78.0 - 100.0 fL   MCH 28.7 26.0 - 34.0 pg   MCHC 30.4 30.0 - 36.0 g/dL   RDW 14.1 11.5 - 15.5 %   Platelets 484 (H) 150 - 400 K/uL   Neutrophils Relative % 94 %   Neutro Abs 20.2 (H) 1.7 - 7.7 K/uL   Lymphocytes Relative 4 %   Lymphs Abs 0.8 0.7 - 4.0 K/uL   Monocytes Relative 2 %   Monocytes Absolute 0.5 0.1 - 1.0 K/uL   Eosinophils Relative 0 %   Eosinophils Absolute 0.0 0.0 - 0.7 K/uL   Basophils Relative 0 %   Basophils Absolute 0.0 0.0 - 0.1 K/uL  Basic metabolic panel     Status: Abnormal   Collection Time: 01/27/17  6:49 AM  Result Value Ref Range   Sodium 131 (L) 135 - 145 mmol/L   Potassium 4.9 3.5 - 5.1 mmol/L   Chloride 92 (L) 101 - 111 mmol/L   CO2 31 22 - 32 mmol/L   Glucose, Bld 160 (H) 65 - 99 mg/dL   BUN 14 6 - 20 mg/dL   Creatinine, Ser 0.46 (L) 0.61 - 1.24 mg/dL   Calcium 8.4 (L) 8.9 - 10.3 mg/dL   GFR calc non Af Amer >60 >60 mL/min   GFR calc Af Amer >60 >60 mL/min    Comment: (NOTE) The eGFR has been calculated using the CKD EPI equation. This calculation has not been  validated in all clinical situations. eGFR's persistently <60 mL/min signify possible Chronic Kidney Disease.    Anion gap 8 5 - 15    ABGS Recent Labs    01/26/17 1930  PHART 7.338*  PO2ART 96.5  HCO3 27.8   CULTURES Recent Results (from the past 240 hour(s))  Blood Culture (routine x 2)     Status: None (Preliminary result)   Collection Time: 01/26/17  7:14 PM  Result Value Ref Range Status   Specimen Description LEFT ANTECUBITAL  Final   Special Requests   Final    BOTTLES DRAWN AEROBIC AND ANAEROBIC Blood Culture adequate volume   Culture NO GROWTH < 12 HOURS  Final   Report  Status PENDING  Incomplete  Blood Culture (routine x 2)     Status: None (Preliminary result)   Collection Time: 01/26/17  7:14 PM  Result Value Ref Range Status   Specimen Description BLOOD RIGHT FOREARM  Final   Special Requests   Final    BOTTLES DRAWN AEROBIC AND ANAEROBIC Blood Culture adequate volume   Culture NO GROWTH < 12 HOURS  Final   Report Status PENDING  Incomplete   Studies/Results: Dg Chest Port 1 View  Result Date: 01/26/2017 CLINICAL DATA:  Increasing shortness of breath and cough for 3 days. EXAM: PORTABLE CHEST 1 VIEW COMPARISON:  01/29/2016 and prior chest radiograph FINDINGS: The cardiomediastinal silhouette is unremarkable. COPD/emphysema changes again noted. Slightly increased interstitial opacities are identified and may represent mild edema versus interstitial infection. No pleural effusion or pneumothorax. No other significant change identified. IMPRESSION: Slightly increased interstitial opacities which may represent mild edema versus interstitial infection. COPD/emphysema. Electronically Signed   By: Margarette Canada M.D.   On: 01/26/2017 19:29    Medications:  Prior to Admission:  Medications Prior to Admission  Medication Sig Dispense Refill Last Dose  . albuterol (PROVENTIL) (2.5 MG/3ML) 0.083% nebulizer solution Take 2.5 mg by nebulization every 4 (four) hours as needed  for wheezing or shortness of breath.    01/26/2017 at Unknown time  . ALPRAZolam (XANAX) 1 MG tablet Take 1 mg by mouth 3 (three) times daily.   01/26/2017 at Unknown time  . ENSURE (ENSURE) Take 237 mLs by mouth 2 (two) times daily between meals.   01/26/2017 at Unknown time  . Fluticasone-Salmeterol (ADVAIR DISKUS) 250-50 MCG/DOSE AEPB Inhale 1 puff into the lungs 2 (two) times daily. 60 each 12 01/26/2017 at Unknown time  . oxyCODONE-acetaminophen (PERCOCET) 10-325 MG tablet Take 1 tablet by mouth every 4 (four) hours as needed for pain. 150 tablet 0 01/26/2017 at Unknown time  . PROAIR HFA 108 (90 Base) MCG/ACT inhaler Inhale 1-2 puffs into the lungs every 6 (six) hours as needed for wheezing or shortness of breath.    01/26/2017 at Unknown time  . traZODone (DESYREL) 50 MG tablet Take 50 mg by mouth at bedtime.   01/25/2017 at Unknown time   Scheduled: . ALPRAZolam  1 mg Oral TID  . enoxaparin (LOVENOX) injection  40 mg Subcutaneous Q24H  . feeding supplement (ENSURE ENLIVE)  237 mL Oral BID BM  . ipratropium-albuterol  3 mL Nebulization Q6H WA  . methylPREDNISolone (SOLU-MEDROL) injection  40 mg Intravenous Q6H  . nicotine  14 mg Transdermal Daily  . traZODone  50 mg Oral QHS   Continuous: . albuterol 10 mg/hr (01/26/17 2038)  . azithromycin Stopped (01/27/17 0320)  . cefTRIAXone (ROCEPHIN)  IV Stopped (01/27/17 0246)  . vancomycin     WUJ:[id number], LORazepam, oxyCODONE-acetaminophen **AND** oxyCODONE  Assesment: He was admitted with acute hypoxic/hypercapnic respiratory failure.  He was on BiPAP and has improved some.  He has community-acquired pneumonia and is on antibiotics for that.  Blood cultures are pending.  He is negative for influenza.  At baseline he has severe lower back pain with spinal stenosis and significant issues with anxiety.  He has ongoing nicotine abuse.  He has significant malnutrition at baseline.  He has elevated troponin which is flat so far so I do not think  this is likely acute coronary syndrome.  However he did have abnormal electrocardiogram.  Chest x-ray is also suggestive of volume overload and his BNP is elevated so I am going to  go ahead and get a echocardiogram as well.he reports 20 pound weight loss Principal Problem:   Acute respiratory failure with hypercapnia (HCC) Active Problems:   COPD exacerbation (HCC)   Tobacco abuse   Pulmonary nodule   Community acquired pneumonia   Chronic back pain greater than 3 months duration   Leukocytosis   GERD (gastroesophageal reflux disease)   Hyponatremia   Hypercholesterolemia   Elevated brain natriuretic peptide (BNP) level   Elevated troponin   Anxiety   Thrombocytosis ([place])    Plan: He will stay in ICU today.  BiPAP as needed.  Check echocardiogram.  Discontinue airborne precautions.  Considering elevated BNP elevated troponin and abnormal electrocardiogram I will request cardiology consultation as well. Ct chest abdomen and pelvis.    LOS: 1 day   Briaunna Grindstaff L 01/27/2017, 8:14 AM

## 2017-01-27 NOTE — Progress Notes (Signed)
ANTIBIOTIC CONSULT NOTE - INITIAL  Pharmacy Consult for vancomycin Indication: pneumonia, r/o sepsis  Allergies  Allergen Reactions  . Codeine Itching and Nausea Only    Patient Measurements: Height: 5\' 8"  (172.7 cm) Weight: 111 lb (50.3 kg) IBW/kg (Calculated) : 68.4   Vital Signs: Temp: 98.1 F (36.7 C) (12/21 0739) Temp Source: Oral (12/21 0739) BP: 112/86 (12/21 0930) Pulse Rate: 90 (12/21 0930) Intake/Output from previous day: 12/20 0701 - 12/21 0700 In: 2200 [IV Piggyback:2200] Out: 400 [Urine:400] Intake/Output from this shift: No intake/output data recorded.  Labs: Recent Labs    01/26/17 1914 01/27/17 0649  WBC 28.1* 21.5*  HGB 14.9 13.3  PLT 547* 484*  CREATININE 0.65 0.46*   Estimated Creatinine Clearance: 76.8 mL/min (A) (by C-G formula based on SCr of 0.46 mg/dL (L)). No results for input(s): VANCOTROUGH, VANCOPEAK, VANCORANDOM, GENTTROUGH, GENTPEAK, GENTRANDOM, TOBRATROUGH, TOBRAPEAK, TOBRARND, AMIKACINPEAK, AMIKACINTROU, AMIKACIN in the last 72 hours.   Microbiology: Recent Results (from the past 720 hour(s))  Blood Culture (routine x 2)     Status: None (Preliminary result)   Collection Time: 01/26/17  7:14 PM  Result Value Ref Range Status   Specimen Description LEFT ANTECUBITAL  Final   Special Requests   Final    BOTTLES DRAWN AEROBIC AND ANAEROBIC Blood Culture adequate volume   Culture NO GROWTH < 12 HOURS  Final   Report Status PENDING  Incomplete  Blood Culture (routine x 2)     Status: None (Preliminary result)   Collection Time: 01/26/17  7:14 PM  Result Value Ref Range Status   Specimen Description BLOOD RIGHT FOREARM  Final   Special Requests   Final    BOTTLES DRAWN AEROBIC AND ANAEROBIC Blood Culture adequate volume   Culture NO GROWTH < 12 HOURS  Final   Report Status PENDING  Incomplete  MRSA PCR Screening     Status: None   Collection Time: 01/26/17  9:28 PM  Result Value Ref Range Status   MRSA by PCR NEGATIVE NEGATIVE  Final    Comment:        The GeneXpert MRSA Assay (FDA approved for NASAL specimens only), is one component of a comprehensive MRSA colonization surveillance program. It is not intended to diagnose MRSA infection nor to guide or monitor treatment for MRSA infections.     Medical History: Past Medical History:  Diagnosis Date  . Anxiety   . Back pain   . COPD (chronic obstructive pulmonary disease) (HCC)    Severe centrilobular emphysema, on home o2  . GERD (gastroesophageal reflux disease)   . Hypercholesterolemia   . Pulmonary nodule 07/04/2011  . S/P colonoscopy March 2012   Normal  . S/P endoscopy March 2012   Reflux esophagitis, no ulcerations    Medications:  Medications Prior to Admission  Medication Sig Dispense Refill Last Dose  . albuterol (PROVENTIL) (2.5 MG/3ML) 0.083% nebulizer solution Take 2.5 mg by nebulization every 4 (four) hours as needed for wheezing or shortness of breath.    01/26/2017 at Unknown time  . ALPRAZolam (XANAX) 1 MG tablet Take 1 mg by mouth 3 (three) times daily.   01/26/2017 at Unknown time  . ENSURE (ENSURE) Take 237 mLs by mouth 2 (two) times daily between meals.   01/26/2017 at Unknown time  . Fluticasone-Salmeterol (ADVAIR DISKUS) 250-50 MCG/DOSE AEPB Inhale 1 puff into the lungs 2 (two) times daily. 60 each 12 01/26/2017 at Unknown time  . oxyCODONE-acetaminophen (PERCOCET) 10-325 MG tablet Take 1 tablet by mouth  every 4 (four) hours as needed for pain. 150 tablet 0 01/26/2017 at Unknown time  . PROAIR HFA 108 (90 Base) MCG/ACT inhaler Inhale 1-2 puffs into the lungs every 6 (six) hours as needed for wheezing or shortness of breath.    01/26/2017 at Unknown time  . traZODone (DESYREL) 50 MG tablet Take 50 mg by mouth at bedtime.   01/25/2017 at Unknown time   Assessment: 51 yo man admitted with CAP.  Vancomycin and zosyn given in ED last night.  Also started on rocephin and azithromycin.  MRSA pcr (-)  Goal of Therapy:  Vancomycin  trough level 15-20 mcg/ml  Plan:  Cont vancomycin 750 mg IV q12 hours Consider dc vanc as MRSA pcr (-) F/u renal function, cultures and clinical course  Thanks for allowing pharmacy to be a part of this patient's care.  Excell Seltzer, PharmD Clinical Pharmacist 01/27/2017,10:35 AM

## 2017-01-27 NOTE — Progress Notes (Signed)
Patient taken off BIPAP and placed on 3LNC. Breathing treatment given. Patient seems to be doing well at this time. Bipap is on standby at bedside. RT will continue to monitor and place back on bipap if needed.

## 2017-01-27 NOTE — Progress Notes (Signed)
Initial Nutrition Assessment (duplicate note) See completed note   DOCUMENTATION CODES:      INTERVENTION:  Ensure Enlive po BID, each supplement provides 350 kcal and 20 grams of protein   Multivitamin daily   NUTRITION DIAGNOSIS:     related to   as evidenced by  .   GOAL:      MONITOR:      REASON FOR ASSESSMENT:  Consult Assessment of nutrition requirement/status, COPD Protocol   ASSESSMENT: Patient has hx of COPD (severe emphysema and on home O2), GERD, Pulmonary nodule. He is an every day smoker 0.5 ppd.   His usual intake    Weight loss of 24 lb (18%) over the past year.   Recent Labs  Lab 01/26/17 1914 01/26/17 2042 01/26/17 2050 01/27/17 0649  NA 132*  --   --  131*  K 4.6  --   --  4.9  CL 90*  --   --  92*  CO2 28  --   --  31  BUN 25*  --   --  14  CREATININE 0.65  --   --  0.46*  CALCIUM 9.7  --   --  8.4*  MG  --  2.1  --   --   PHOS  --   --  3.7  --   GLUCOSE 137*  --   --  160*  Labs: sodium 131, Cl 92, glu 160. Mag and phos -WNL.   Meds: Rocephin, Vancomycin, Zithromax   Diet Order:  Diet regular Room service appropriate? Yes; Fluid consistency: Thin  EDUCATION NEEDS:   Skin:     Last BM:     Height:   Ht Readings from Last 1 Encounters:  01/26/17 5\' 8"  (1.727 m)    Weight:   Wt Readings from Last 1 Encounters:  01/26/17 111 lb (50.3 kg)    Ideal Body Weight:     BMI:  Body mass index is 16.88 kg/m.  Estimated Nutritional Needs:   Kcal:     Protein:     Fluid:      Colman Cater MS,RD,CSG,LDN Office: [phone] Pager: [phone]

## 2017-01-27 NOTE — Progress Notes (Signed)
Initial Nutrition Assessment  DOCUMENTATION CODES:   Non-severe (moderate) malnutrition in context of chronic illness, Underweight  INTERVENTION:  Ensure Enlive po BID, each supplement provides 350 kcal and 20 grams of protein   Regular diet   NUTRITION DIAGNOSIS:   Moderate Malnutrition(Chronic ) related to catabolic illness, chronic illness(severe emphysema) as evidenced by moderate fat depletion, moderate muscle depletion.   GOAL:  Pt to meet >/= 90% of their estimated nutrition needs    MONITOR:   PO intake, Labs, Weight trends, Supplement acceptance  REASON FOR ASSESSMENT:   Consult Assessment of nutrition requirement/status, COPD Protocol  ASSESSMENT: the patient has a hx of COPD (severe emphysema and on home O2  (2Liters). He lives with his mother who helps to care for him. She shops for their food and prepares his meals. He endorses shortness of breath with ambulation in the house and says his eating is impaired due to this as well. His weight is down 24 lb (14%) compared to last year same timeframe. He also has moderate to severe muscle depletions (clavicle, dorsal, acromion), moderate subcutaneous fat loss (orbital, thoracic and triceps). He meets criteria for moderate chronic but is very high risk for worsening nutrition status due to his severe respiratory disease.   Recent Labs  Lab 01/26/17 1914 01/26/17 2042 01/26/17 2050 01/27/17 0649  NA 132*  --   --  131*  K 4.6  --   --  4.9  CL 90*  --   --  92*  CO2 28  --   --  31  BUN 25*  --   --  14  CREATININE 0.65  --   --  0.46*  CALCIUM 9.7  --   --  8.4*  MG  --  2.1  --   --   PHOS  --   --  3.7  --   GLUCOSE 137*  --   --  160*   labs: sodium 132, glucose 160, mag and phos WNL  NUTRITION - FOCUSED PHYSICAL EXAM: findings noted above   Diet Order:  Diet regular Room service appropriate? Yes; Fluid consistency: Thin  EDUCATION NEEDS:     Skin:  Skin Assessment: Reviewed RN Assessment  Last BM:   unknown  Height:   Ht Readings from Last 1 Encounters:  01/26/17 5\' 8"  (1.727 m)    Weight:   Wt Readings from Last 1 Encounters:  01/26/17 111 lb (50.3 kg)    Ideal Body Weight:  70 kg  BMI:  Body mass index is 16.88 kg/m.  Estimated Nutritional Needs:   Kcal:  1750-2000  Protein:  75-80 gr  Fluid:  >1500 ml daily   Colman Cater MS,RD,CSG,LDN Office: [phone] Pager: [phone]

## 2017-01-27 NOTE — Care Management Note (Signed)
Case Management Note  Patient Details  Name: Cameron Villarreal MRN: [id number] Date of Birth: 04/11/1965  Subjective/Objective:  Chart reviewed. Adm with Acute respiratory failure with hypercapnia.  Pt is from home, lives with his mother and is ind with ADL's. Pt has home O2 and neb machine. Currently on 2L.   Action/Plan: CM following for needs.   Expected Discharge Date:    unk              Expected Discharge Plan:     In-House Referral:     Discharge planning Services  CM Consult  Post Acute Care Choice:    Choice offered to:     DME Arranged:    DME Agency:     HH Arranged:    HH Agency:     Status of Service:  In process, will continue to follow  If discussed at Long Length of Stay Meetings, dates discussed:    Additional Comments:  Cabella Kimm, Chauncey Reading, RN 01/27/2017, 3:08 PM

## 2017-01-27 NOTE — Plan of Care (Signed)
Pt is has a strong and productive cough and is producing thick brown/green sputum. He is normally on 2 L oxygen PRN at home and tolerated 2 L via [place] for most of the day but had to be titrated up to 3 L this afternoon as he was unable to maintain saturations in the 90's. His sats dropped to 85% on 2 L. Managing chronic pain and anxiety on current ordered medications given PRN. Has very poor appetite and only eats approx 25% of meals. States it is very hard for him to eat since he has trouble breathing.

## 2017-01-28 ENCOUNTER — Inpatient Hospital Stay (HOSPITAL_COMMUNITY): Payer: Medicare Other

## 2017-01-28 DIAGNOSIS — R0600 Dyspnea, unspecified: Secondary | ICD-10-CM

## 2017-01-28 LAB — ECHOCARDIOGRAM COMPLETE
AVLVOTPG: 3 mmHg
CHL CUP DOP CALC LVOT VTI: 18.5 cm
CHL CUP MV DEC (S): 194
CHL CUP STROKE VOLUME: 62 mL
E decel time: 194 msec
EERAT: 4.53
FS: 34 % (ref 28–44)
Height: 68 in
IV/PV OW: 1.02
LA diam index: 2.18 cm/m2
LA vol A4C: 46.1 ml
LASIZE: 35 mm
LAVOL: 50.9 mL
LAVOLIN: 31.8 mL/m2
LDCA: 3.14 cm2
LEFT ATRIUM END SYS DIAM: 35 mm
LV PW d: 7.65 mm — AB (ref 0.6–1.1)
LV SIMPSON'S DISK: 61
LV dias vol: 101 mL (ref 62–150)
LV e' LATERAL: 11.1 cm/s
LV sys vol index: 24 mL/m2
LVDIAVOLIN: 63 mL/m2
LVEEAVG: 4.53
LVEEMED: 4.53
LVOT SV: 58 mL
LVOT diameter: 20 mm
LVOT peak vel: 83 cm/s
LVSYSVOL: 39 mL (ref 21–61)
Lateral S' vel: 10.3 cm/s
MV pk A vel: 64.8 m/s
MVPKEVEL: 50.3 m/s
Reg peak vel: 253 cm/s
TAPSE: 26.4 mm
TDI e' lateral: 11.1
TDI e' medial: 7.72
TR max vel: 253 cm/s
Weight: 1915.36 oz

## 2017-01-28 LAB — CBC WITH DIFFERENTIAL/PLATELET
BASOS ABS: 0 10*3/uL (ref 0.0–0.1)
Basophils Relative: 0 %
Eosinophils Absolute: 0 10*3/uL (ref 0.0–0.7)
Eosinophils Relative: 0 %
HCT: 43.1 % (ref 39.0–52.0)
Hemoglobin: 12.6 g/dL — ABNORMAL LOW (ref 13.0–17.0)
LYMPHS ABS: 1.1 10*3/uL (ref 0.7–4.0)
Lymphocytes Relative: 5 %
MCH: 28.2 pg (ref 26.0–34.0)
MCHC: 29.2 g/dL — ABNORMAL LOW (ref 30.0–36.0)
MCV: 96.4 fL (ref 78.0–100.0)
MONO ABS: 1.1 10*3/uL — AB (ref 0.1–1.0)
Monocytes Relative: 5 %
Neutro Abs: 20.6 10*3/uL — ABNORMAL HIGH (ref 1.7–7.7)
Neutrophils Relative %: 90 %
PLATELETS: 528 10*3/uL — AB (ref 150–400)
RBC: 4.47 MIL/uL (ref 4.22–5.81)
RDW: 14 % (ref 11.5–15.5)
WBC: 22.8 10*3/uL — AB (ref 4.0–10.5)

## 2017-01-28 LAB — BASIC METABOLIC PANEL
ANION GAP: 11 (ref 5–15)
BUN: 13 mg/dL (ref 6–20)
CALCIUM: 9.1 mg/dL (ref 8.9–10.3)
CO2: 33 mmol/L — ABNORMAL HIGH (ref 22–32)
CREATININE: 0.48 mg/dL — AB (ref 0.61–1.24)
Chloride: 89 mmol/L — ABNORMAL LOW (ref 101–111)
GLUCOSE: 160 mg/dL — AB (ref 65–99)
Potassium: 4.5 mmol/L (ref 3.5–5.1)
Sodium: 133 mmol/L — ABNORMAL LOW (ref 135–145)

## 2017-01-28 LAB — URINALYSIS, ROUTINE W REFLEX MICROSCOPIC
BILIRUBIN URINE: NEGATIVE
GLUCOSE, UA: NEGATIVE mg/dL
Hgb urine dipstick: NEGATIVE
KETONES UR: NEGATIVE mg/dL
Leukocytes, UA: NEGATIVE
NITRITE: NEGATIVE
PH: 6 (ref 5.0–8.0)
PROTEIN: NEGATIVE mg/dL
Specific Gravity, Urine: 1.023 (ref 1.005–1.030)

## 2017-01-28 LAB — HIV ANTIBODY (ROUTINE TESTING W REFLEX): HIV Screen 4th Generation wRfx: NONREACTIVE

## 2017-01-28 LAB — STREP PNEUMONIAE URINARY ANTIGEN: STREP PNEUMO URINARY ANTIGEN: NEGATIVE

## 2017-01-28 MED ORDER — BOOST / RESOURCE BREEZE PO LIQD CUSTOM
1.0000 | Freq: Three times a day (TID) | ORAL | Status: DC
  Administered 2017-01-28 – 2017-01-29 (×4): 1 via ORAL

## 2017-01-28 NOTE — Plan of Care (Signed)
Care plans progressing. Also talked with mother and discussed plans with her.

## 2017-01-28 NOTE — Progress Notes (Signed)
*  PRELIMINARY RESULTS* Echocardiogram 2D Echocardiogram has been performed.  Samuel Germany 01/28/2017, 9:48 AM

## 2017-01-28 NOTE — Progress Notes (Signed)
Subjective: He continues to be short of breath.  He is very sleepy this morning.  Echocardiogram has just been done.  His CT shows a right lower lobe nodule versus infiltrate and he does have some question of MAC.  Objective: Vital signs in last 24 hours: Temp:  [97.4 F (36.3 C)-97.6 F (36.4 C)] 97.4 F (36.3 C) (12/22 0722) Pulse Rate:  [38-97] 65 (12/22 0722) Resp:  [8-20] 11 (12/22 0722) BP: (87-112)/(67-93) 94/70 (12/22 0700) SpO2:  [89 %-100 %] 100 % (12/22 0830) Weight:  [54.3 kg (119 lb 11.4 oz)] 54.3 kg (119 lb 11.4 oz) (12/22 0400) Weight change: 3.951 kg (8 lb 11.4 oz) Last BM Date: (PTA)  Intake/Output from previous day: 12/21 0701 - 12/22 0700 In: 600 [IV Piggyback:600] Out: 875 [Urine:875]  PHYSICAL EXAM General appearance: He is sleepy this morning but arousable Resp: rhonchi bilaterally Cardio: regular rate and rhythm, S1, S2 normal, no murmur, click, rub or gallop GI: soft, non-tender; bowel sounds normal; no masses,  no organomegaly Extremities: extremities normal, atraumatic, no cyanosis or edema skin warm and dry  Lab Results:  Results for orders placed or performed during the hospital encounter of 01/26/17 (from the past 48 hour(s))  Comprehensive metabolic panel     Status: Abnormal   Collection Time: 01/26/17  7:14 PM  Result Value Ref Range   Sodium 132 (L) 135 - 145 mmol/L   Potassium 4.6 3.5 - 5.1 mmol/L   Chloride 90 (L) 101 - 111 mmol/L   CO2 28 22 - 32 mmol/L   Glucose, Bld 137 (H) 65 - 99 mg/dL   BUN 25 (H) 6 - 20 mg/dL   Creatinine, Ser 0.65 0.61 - 1.24 mg/dL   Calcium 9.7 8.9 - 10.3 mg/dL   Total Protein 8.6 (H) 6.5 - 8.1 g/dL   Albumin 3.7 3.5 - 5.0 g/dL   AST 19 15 - 41 U/L   ALT 12 (L) 17 - 63 U/L   Alkaline Phosphatase 123 38 - 126 U/L   Total Bilirubin 0.4 0.3 - 1.2 mg/dL   GFR calc non Af Amer >60 >60 mL/min   GFR calc Af Amer >60 >60 mL/min    Comment: (NOTE) The eGFR has been calculated using the CKD EPI equation. This  calculation has not been validated in all clinical situations. eGFR's persistently <60 mL/min signify possible Chronic Kidney Disease.    Anion gap 14 5 - 15  CBC WITH DIFFERENTIAL     Status: Abnormal   Collection Time: 01/26/17  7:14 PM  Result Value Ref Range   WBC 28.1 (H) 4.0 - 10.5 K/uL    Comment: WHITE COUNT CONFIRMED ON SMEAR   RBC 5.27 4.22 - 5.81 MIL/uL   Hemoglobin 14.9 13.0 - 17.0 g/dL   HCT 47.9 39.0 - 52.0 %   MCV 90.9 78.0 - 100.0 fL   MCH 28.3 26.0 - 34.0 pg   MCHC 31.1 30.0 - 36.0 g/dL   RDW 14.3 11.5 - 15.5 %   Platelets 547 (H) 150 - 400 K/uL    Comment: SPECIMEN CHECKED FOR CLOTS PLATELET COUNT CONFIRMED BY SMEAR    Neutrophils Relative % 80 %   Lymphocytes Relative 5 %   Monocytes Relative 15 %   Eosinophils Relative 0 %   Basophils Relative 0 %   Neutro Abs 22.5 (H) 1.7 - 7.7 K/uL   Lymphs Abs 1.4 0.7 - 4.0 K/uL   Monocytes Absolute 4.2 (H) 0.1 - 1.0 K/uL   Eosinophils  Absolute 0.0 0.0 - 0.7 K/uL   Basophils Absolute 0.0 0.0 - 0.1 K/uL   WBC Morphology MILD LEFT SHIFT (1-5% METAS, OCC MYELO, OCC BANDS)     Comment: SMUDGE CELLS  Blood Culture (routine x 2)     Status: None (Preliminary result)   Collection Time: 01/26/17  7:14 PM  Result Value Ref Range   Specimen Description LEFT ANTECUBITAL    Special Requests      BOTTLES DRAWN AEROBIC AND ANAEROBIC Blood Culture adequate volume   Culture NO GROWTH 2 DAYS    Report Status PENDING   Blood Culture (routine x 2)     Status: None (Preliminary result)   Collection Time: 01/26/17  7:14 PM  Result Value Ref Range   Specimen Description BLOOD RIGHT FOREARM    Special Requests      BOTTLES DRAWN AEROBIC AND ANAEROBIC Blood Culture adequate volume   Culture NO GROWTH 2 DAYS    Report Status PENDING   Troponin I     Status: Abnormal   Collection Time: 01/26/17  7:14 PM  Result Value Ref Range   Troponin I 0.04 (HH) <0.03 ng/mL    Comment: CRITICAL RESULT CALLED TO, READ BACK BY AND VERIFIED  WITH: LONG,H AT 2005 ON 12.20.2018 BY ISLEY,B   Brain natriuretic peptide     Status: Abnormal   Collection Time: 01/26/17  7:14 PM  Result Value Ref Range   B Natriuretic Peptide 622.0 (H) 0.0 - 100.0 pg/mL  I-Stat CG4 Lactic Acid, ED  (not at  [facility] [place])     Status: Abnormal   Collection Time: 01/26/17  7:22 PM  Result Value Ref Range   Lactic Acid, Venous 2.21 (HH) 0.5 - 1.9 mmol/L  Blood gas, arterial ([facility] & [facility] ONLY)     Status: Abnormal   Collection Time: 01/26/17  7:30 PM  Result Value Ref Range   FIO2 40.00    Delivery systems BILEVEL POSITIVE AIRWAY PRESSURE    LHR 10.0 resp/min   Inspiratory PAP 12    Expiratory PAP 6    pH, Arterial 7.338 (L) 7.350 - 7.450   pCO2 arterial 59.2 (H) 32.0 - 48.0 mmHg   pO2, Arterial 96.5 83.0 - 108.0 mmHg   Bicarbonate 27.8 20.0 - 28.0 mmol/L   Acid-Base Excess 5.5 (H) 0.0 - 2.0 mmol/L   O2 Saturation 96.1 %   Collection site RIGHT RADIAL    Drawn by [id number]    Sample type ARTERIAL    Allens test (pass/fail) PASS PASS  Influenza panel by PCR (type A & B)     Status: None   Collection Time: 01/26/17  8:16 PM  Result Value Ref Range   Influenza A By PCR NEGATIVE NEGATIVE   Influenza B By PCR NEGATIVE NEGATIVE    Comment: (NOTE) The Xpert Xpress Flu assay is intended as an aid in the diagnosis of  influenza and should not be used as a sole basis for treatment.  This  assay is FDA approved for nasopharyngeal swab specimens only. Nasal  washings and aspirates are unacceptable for Xpert Xpress Flu testing.   Magnesium     Status: None   Collection Time: 01/26/17  8:42 PM  Result Value Ref Range   Magnesium 2.1 1.7 - 2.4 mg/dL  Phosphorus     Status: None   Collection Time: 01/26/17  8:50 PM  Result Value Ref Range   Phosphorus 3.7 2.5 - 4.6 mg/dL  CG4 I-STAT (Lactic acid)  Status: Abnormal   Collection Time: 01/26/17  9:12 PM  Result Value Ref Range   Lactic Acid, Venous 1.96 (H) 0.5 - 1.9 mmol/L  MRSA PCR Screening     Status: None    Collection Time: 01/26/17  9:28 PM  Result Value Ref Range   MRSA by PCR NEGATIVE NEGATIVE    Comment:        The GeneXpert MRSA Assay (FDA approved for NASAL specimens only), is one component of a comprehensive MRSA colonization surveillance program. It is not intended to diagnose MRSA infection nor to guide or monitor treatment for MRSA infections.   Troponin I (q 6hr x 3)     Status: Abnormal   Collection Time: 01/27/17 12:51 AM  Result Value Ref Range   Troponin I 0.03 (HH) <0.03 ng/mL    Comment: CRITICAL VALUE NOTED.  VALUE IS CONSISTENT WITH PREVIOUSLY REPORTED AND CALLED VALUE.  Lactic acid, plasma     Status: None   Collection Time: 01/27/17 12:51 AM  Result Value Ref Range   Lactic Acid, Venous 1.4 0.5 - 1.9 mmol/L  Troponin I (q 6hr x 3)     Status: Abnormal   Collection Time: 01/27/17  6:49 AM  Result Value Ref Range   Troponin I 0.03 (HH) <0.03 ng/mL    Comment: CRITICAL VALUE NOTED.  VALUE IS CONSISTENT WITH PREVIOUSLY REPORTED AND CALLED VALUE.  CBC WITH DIFFERENTIAL     Status: Abnormal   Collection Time: 01/27/17  6:49 AM  Result Value Ref Range   WBC 21.5 (H) 4.0 - 10.5 K/uL   RBC 4.64 4.22 - 5.81 MIL/uL   Hemoglobin 13.3 13.0 - 17.0 g/dL   HCT 43.7 39.0 - 52.0 %   MCV 94.2 78.0 - 100.0 fL   MCH 28.7 26.0 - 34.0 pg   MCHC 30.4 30.0 - 36.0 g/dL   RDW 14.1 11.5 - 15.5 %   Platelets 484 (H) 150 - 400 K/uL   Neutrophils Relative % 94 %   Neutro Abs 20.2 (H) 1.7 - 7.7 K/uL   Lymphocytes Relative 4 %   Lymphs Abs 0.8 0.7 - 4.0 K/uL   Monocytes Relative 2 %   Monocytes Absolute 0.5 0.1 - 1.0 K/uL   Eosinophils Relative 0 %   Eosinophils Absolute 0.0 0.0 - 0.7 K/uL   Basophils Relative 0 %   Basophils Absolute 0.0 0.0 - 0.1 K/uL  Basic metabolic panel     Status: Abnormal   Collection Time: 01/27/17  6:49 AM  Result Value Ref Range   Sodium 131 (L) 135 - 145 mmol/L   Potassium 4.9 3.5 - 5.1 mmol/L   Chloride 92 (L) 101 - 111 mmol/L   CO2 31 22 - 32  mmol/L   Glucose, Bld 160 (H) 65 - 99 mg/dL   BUN 14 6 - 20 mg/dL   Creatinine, Ser 0.46 (L) 0.61 - 1.24 mg/dL   Calcium 8.4 (L) 8.9 - 10.3 mg/dL   GFR calc non Af Amer >60 >60 mL/min   GFR calc Af Amer >60 >60 mL/min    Comment: (NOTE) The eGFR has been calculated using the CKD EPI equation. This calculation has not been validated in all clinical situations. eGFR's persistently <60 mL/min signify possible Chronic Kidney Disease.    Anion gap 8 5 - 15  HIV antibody     Status: None   Collection Time: 01/27/17  6:49 AM  Result Value Ref Range   HIV Screen 4th Generation wRfx Non  Reactive Non Reactive    Comment: (NOTE) Performed At: [facility] [place], [place] [id number] Rush Farmer MD AS:[id number]   CBC WITH DIFFERENTIAL     Status: Abnormal   Collection Time: 01/28/17  4:20 AM  Result Value Ref Range   WBC 22.8 (H) 4.0 - 10.5 K/uL   RBC 4.47 4.22 - 5.81 MIL/uL   Hemoglobin 12.6 (L) 13.0 - 17.0 g/dL   HCT 43.1 39.0 - 52.0 %   MCV 96.4 78.0 - 100.0 fL   MCH 28.2 26.0 - 34.0 pg   MCHC 29.2 (L) 30.0 - 36.0 g/dL   RDW 14.0 11.5 - 15.5 %   Platelets 528 (H) 150 - 400 K/uL   Neutrophils Relative % 90 %   Lymphocytes Relative 5 %   Monocytes Relative 5 %   Eosinophils Relative 0 %   Basophils Relative 0 %   Neutro Abs 20.6 (H) 1.7 - 7.7 K/uL   Lymphs Abs 1.1 0.7 - 4.0 K/uL   Monocytes Absolute 1.1 (H) 0.1 - 1.0 K/uL   Eosinophils Absolute 0.0 0.0 - 0.7 K/uL   Basophils Absolute 0.0 0.0 - 0.1 K/uL   WBC Morphology ATYPICAL LYMPHOCYTES     Comment: VACUOLATED NEUTROPHILS  Basic metabolic panel     Status: Abnormal   Collection Time: 01/28/17  4:20 AM  Result Value Ref Range   Sodium 133 (L) 135 - 145 mmol/L   Potassium 4.5 3.5 - 5.1 mmol/L   Chloride 89 (L) 101 - 111 mmol/L   CO2 33 (H) 22 - 32 mmol/L   Glucose, Bld 160 (H) 65 - 99 mg/dL   BUN 13 6 - 20 mg/dL   Creatinine, Ser 0.48 (L) 0.61 - 1.24 mg/dL   Calcium 9.1 8.9 - 10.3 mg/dL   GFR  calc non Af Amer >60 >60 mL/min   GFR calc Af Amer >60 >60 mL/min    Comment: (NOTE) The eGFR has been calculated using the CKD EPI equation. This calculation has not been validated in all clinical situations. eGFR's persistently <60 mL/min signify possible Chronic Kidney Disease.    Anion gap 11 5 - 15    ABGS Recent Labs    01/26/17 1930  PHART 7.338*  PO2ART 96.5  HCO3 27.8   CULTURES Recent Results (from the past 240 hour(s))  Blood Culture (routine x 2)     Status: None (Preliminary result)   Collection Time: 01/26/17  7:14 PM  Result Value Ref Range Status   Specimen Description LEFT ANTECUBITAL  Final   Special Requests   Final    BOTTLES DRAWN AEROBIC AND ANAEROBIC Blood Culture adequate volume   Culture NO GROWTH 2 DAYS  Final   Report Status PENDING  Incomplete  Blood Culture (routine x 2)     Status: None (Preliminary result)   Collection Time: 01/26/17  7:14 PM  Result Value Ref Range Status   Specimen Description BLOOD RIGHT FOREARM  Final   Special Requests   Final    BOTTLES DRAWN AEROBIC AND ANAEROBIC Blood Culture adequate volume   Culture NO GROWTH 2 DAYS  Final   Report Status PENDING  Incomplete  MRSA PCR Screening     Status: None   Collection Time: 01/26/17  9:28 PM  Result Value Ref Range Status   MRSA by PCR NEGATIVE NEGATIVE Final    Comment:        The GeneXpert MRSA Assay (FDA approved for NASAL specimens only), is one component  of a comprehensive MRSA colonization surveillance program. It is not intended to diagnose MRSA infection nor to guide or monitor treatment for MRSA infections.    Studies/Results: Ct Chest W Contrast  Result Date: 01/27/2017 CLINICAL DATA:  Chest and abdominal pain. Pleuritic chest pain, productive cough with wheezing, sputum and worsening dyspnea. Palpitation. 20 pound weight loss over the last couple months. Also history of COPD, hypertension, hypercholesterolemia, anxiety, GERD, tobacco abuse, abnormal EKG  and positive troponin. EXAM: CT CHEST, ABDOMEN, AND PELVIS WITH CONTRAST TECHNIQUE: Multidetector CT imaging of the chest, abdomen and pelvis was performed following the standard protocol during bolus administration of intravenous contrast. CONTRAST:  110m ISOVUE-300 IOPAMIDOL (ISOVUE-300) INJECTION 61% COMPARISON:  CT abdomen dated 04/29/2010. Chest x-ray dated 01/26/2017. FINDINGS: CT CHEST FINDINGS Cardiovascular: Heart size is normal. No pericardial effusion. Coronary artery calcifications noted. No thoracic aortic aneurysm or dissection. Mediastinum/Nodes: No mass or enlarged lymph nodes within the mediastinum or perihilar regions. Esophagus appears normal. Trachea and central bronchi are unremarkable. Lungs/Pleura: Irregular nodular consolidation within the right lower lobe, measuring 1.3 x 1.1 cm (series 3, image 92). Scattered areas of tree-in-bud pattern within the periphery of each lung, most prominent in the right lower lobe. Pleural-parenchymal scarring/fibrosis at each lung apex. Advanced emphysematous changes bilaterally, upper lobe predominant. Additional small consolidations at each lung base, most likely atelectasis. Associated debris within the peripheral bronchial segments to the right lower lobe. Bilateral bronchial wall thickening, lower lobe predominant, indicating bronchitis, acute or chronic. Musculoskeletal: No acute or suspicious osseous finding. CT ABDOMEN PELVIS FINDINGS Hepatobiliary: No focal liver abnormality is seen. Status post cholecystectomy. No biliary dilatation. Pancreas: Unremarkable. No pancreatic ductal dilatation or surrounding inflammatory changes. Spleen: Normal in size without focal abnormality. Adrenals/Urinary Tract: Adrenal glands appear normal. Kidneys are unremarkable without mass, stone or hydronephrosis. No ureteral or bladder calculi identified. Bladder appears normal. Stomach/Bowel: No dilated large or small bowel loops. No bowel wall thickening or evidence of  bowel wall inflammation seen. Fairly large amount of stool in the right colon. Appendix appears normal. Vascular/Lymphatic: Aortic atherosclerosis. No enlarged abdominal or pelvic lymph nodes. Reproductive: Prostate is unremarkable. Other: No free fluid or abscess collection identified. No free intraperitoneal air. There has been an interval reduction of the intra-abdominal fat compatible with the given history of weight loss. Musculoskeletal: No acute or suspicious osseous finding. Mild degenerative change in the lumbar spine. IMPRESSION: 1. Irregular pulmonary nodule, versus nodular consolidation, within the right lower lobe, measuring 1.3 cm. Differential considerations include neoplastic nodule, pneumonia, nodular atelectasis and confluent nodular scarring/fibrosis. The tree in bud pattern within the periphery of the right lower lobe could represent associated endobronchial spread of pneumonia or tumor. Consider one of the following in 3 months for both low-risk and high-risk individuals: (a) repeat chest CT, (b) follow-up PET-CT, or (c) tissue sampling. The lesion does appear amenable to CT-guided percutaneous biopsy. This recommendation follows the consensus statement: Guidelines for Management of Incidental Pulmonary Nodules Detected on CT Images: From the Fleischner Society 2017; Radiology 2017; 284:228-243. 2. Additional areas of tree-in-bud pattern throughout both lungs, again most prominent in the right lower lobe. This is suspicious for infectious bronchiolitis, most commonly related to atypical pneumonia such as viral, fungal, MAC and pulmonary tuberculosis (tuberculosis considered much less likely given the lower lobe distribution). 3. Emphysema, moderate to severe in degree, upper lobe predominant. 4. Bronchitic changes, of uncertain acuity. 5. Small bibasilar consolidations, most likely atelectasis. 6. No acute findings within the abdomen or pelvis. No evidence of  neoplastic process in the abdomen or  pelvis. Decreased intra-abdominal fat, compatible with the given history of unintentional weight loss. 7. Aortic atherosclerosis. 8. Coronary artery calcifications. Electronically Signed   By: Franki Cabot M.D.   On: 01/27/2017 13:49   Ct Abdomen Pelvis W Contrast  Result Date: 01/27/2017 CLINICAL DATA:  Chest and abdominal pain. Pleuritic chest pain, productive cough with wheezing, sputum and worsening dyspnea. Palpitation. 20 pound weight loss over the last couple months. Also history of COPD, hypertension, hypercholesterolemia, anxiety, GERD, tobacco abuse, abnormal EKG and positive troponin. EXAM: CT CHEST, ABDOMEN, AND PELVIS WITH CONTRAST TECHNIQUE: Multidetector CT imaging of the chest, abdomen and pelvis was performed following the standard protocol during bolus administration of intravenous contrast. CONTRAST:  155m ISOVUE-300 IOPAMIDOL (ISOVUE-300) INJECTION 61% COMPARISON:  CT abdomen dated 04/29/2010. Chest x-ray dated 01/26/2017. FINDINGS: CT CHEST FINDINGS Cardiovascular: Heart size is normal. No pericardial effusion. Coronary artery calcifications noted. No thoracic aortic aneurysm or dissection. Mediastinum/Nodes: No mass or enlarged lymph nodes within the mediastinum or perihilar regions. Esophagus appears normal. Trachea and central bronchi are unremarkable. Lungs/Pleura: Irregular nodular consolidation within the right lower lobe, measuring 1.3 x 1.1 cm (series 3, image 92). Scattered areas of tree-in-bud pattern within the periphery of each lung, most prominent in the right lower lobe. Pleural-parenchymal scarring/fibrosis at each lung apex. Advanced emphysematous changes bilaterally, upper lobe predominant. Additional small consolidations at each lung base, most likely atelectasis. Associated debris within the peripheral bronchial segments to the right lower lobe. Bilateral bronchial wall thickening, lower lobe predominant, indicating bronchitis, acute or chronic. Musculoskeletal: No  acute or suspicious osseous finding. CT ABDOMEN PELVIS FINDINGS Hepatobiliary: No focal liver abnormality is seen. Status post cholecystectomy. No biliary dilatation. Pancreas: Unremarkable. No pancreatic ductal dilatation or surrounding inflammatory changes. Spleen: Normal in size without focal abnormality. Adrenals/Urinary Tract: Adrenal glands appear normal. Kidneys are unremarkable without mass, stone or hydronephrosis. No ureteral or bladder calculi identified. Bladder appears normal. Stomach/Bowel: No dilated large or small bowel loops. No bowel wall thickening or evidence of bowel wall inflammation seen. Fairly large amount of stool in the right colon. Appendix appears normal. Vascular/Lymphatic: Aortic atherosclerosis. No enlarged abdominal or pelvic lymph nodes. Reproductive: Prostate is unremarkable. Other: No free fluid or abscess collection identified. No free intraperitoneal air. There has been an interval reduction of the intra-abdominal fat compatible with the given history of weight loss. Musculoskeletal: No acute or suspicious osseous finding. Mild degenerative change in the lumbar spine. IMPRESSION: 1. Irregular pulmonary nodule, versus nodular consolidation, within the right lower lobe, measuring 1.3 cm. Differential considerations include neoplastic nodule, pneumonia, nodular atelectasis and confluent nodular scarring/fibrosis. The tree in bud pattern within the periphery of the right lower lobe could represent associated endobronchial spread of pneumonia or tumor. Consider one of the following in 3 months for both low-risk and high-risk individuals: (a) repeat chest CT, (b) follow-up PET-CT, or (c) tissue sampling. The lesion does appear amenable to CT-guided percutaneous biopsy. This recommendation follows the consensus statement: Guidelines for Management of Incidental Pulmonary Nodules Detected on CT Images: From the Fleischner Society 2017; Radiology 2017; 284:228-243. 2. Additional areas of  tree-in-bud pattern throughout both lungs, again most prominent in the right lower lobe. This is suspicious for infectious bronchiolitis, most commonly related to atypical pneumonia such as viral, fungal, MAC and pulmonary tuberculosis (tuberculosis considered much less likely given the lower lobe distribution). 3. Emphysema, moderate to severe in degree, upper lobe predominant. 4. Bronchitic changes, of uncertain acuity. 5. Small bibasilar  consolidations, most likely atelectasis. 6. No acute findings within the abdomen or pelvis. No evidence of neoplastic process in the abdomen or pelvis. Decreased intra-abdominal fat, compatible with the given history of unintentional weight loss. 7. Aortic atherosclerosis. 8. Coronary artery calcifications. Electronically Signed   By: Franki Cabot M.D.   On: 01/27/2017 13:49   Dg Chest Port 1 View  Result Date: 01/26/2017 CLINICAL DATA:  Increasing shortness of breath and cough for 3 days. EXAM: PORTABLE CHEST 1 VIEW COMPARISON:  01/29/2016 and prior chest radiograph FINDINGS: The cardiomediastinal silhouette is unremarkable. COPD/emphysema changes again noted. Slightly increased interstitial opacities are identified and may represent mild edema versus interstitial infection. No pleural effusion or pneumothorax. No other significant change identified. IMPRESSION: Slightly increased interstitial opacities which may represent mild edema versus interstitial infection. COPD/emphysema. Electronically Signed   By: Margarette Canada M.D.   On: 01/26/2017 19:29    Medications:  Prior to Admission:  Medications Prior to Admission  Medication Sig Dispense Refill Last Dose  . albuterol (PROVENTIL) (2.5 MG/3ML) 0.083% nebulizer solution Take 2.5 mg by nebulization every 4 (four) hours as needed for wheezing or shortness of breath.    01/26/2017 at Unknown time  . ALPRAZolam (XANAX) 1 MG tablet Take 1 mg by mouth 3 (three) times daily.   01/26/2017 at Unknown time  . ENSURE (ENSURE)  Take 237 mLs by mouth 2 (two) times daily between meals.   01/26/2017 at Unknown time  . Fluticasone-Salmeterol (ADVAIR DISKUS) 250-50 MCG/DOSE AEPB Inhale 1 puff into the lungs 2 (two) times daily. 60 each 12 01/26/2017 at Unknown time  . oxyCODONE-acetaminophen (PERCOCET) 10-325 MG tablet Take 1 tablet by mouth every 4 (four) hours as needed for pain. 150 tablet 0 01/26/2017 at Unknown time  . PROAIR HFA 108 (90 Base) MCG/ACT inhaler Inhale 1-2 puffs into the lungs every 6 (six) hours as needed for wheezing or shortness of breath.    01/26/2017 at Unknown time  . traZODone (DESYREL) 50 MG tablet Take 50 mg by mouth at bedtime.   01/25/2017 at Unknown time   Scheduled: . ALPRAZolam  1 mg Oral TID  . enoxaparin (LOVENOX) injection  40 mg Subcutaneous Q24H  . feeding supplement (ENSURE ENLIVE)  237 mL Oral BID BM  . ipratropium-albuterol  3 mL Nebulization Q6H WA  . multivitamin with minerals  1 tablet Oral Daily  . nicotine  14 mg Transdermal Daily  . traZODone  50 mg Oral QHS   Continuous: . albuterol 10 mg/hr (01/26/17 2038)  . azithromycin Stopped (01/28/17 0423)  . cefTRIAXone (ROCEPHIN)  IV Stopped (01/28/17 0231)  . vancomycin 750 mg (01/28/17 0806)   BJS:[id number], LORazepam, oxyCODONE-acetaminophen **AND** oxyCODONE  Assesment: He was admitted with COPD exacerbation and acute hypercapnic and hypoxic respiratory failure.  He had elevated BNP and echocardiogram is pending.  He had elevated troponin but does not seem to be having acute coronary syndrome.  He has community-acquired pneumonia.  He may have MAC but I think it is extremely unlikely that he has tuberculosis and I am not going to put him on respiratory isolation.  He has had weight loss despite eating pretty well according to his family.  At baseline he has chronic back pain and chronic anxiety.  He is still smoking.  He has a right lower lobe pulmonary nodule versus infiltrate which will need to be followed closely. Principal  Problem:   Acute respiratory failure with hypercapnia (HCC) Active Problems:   COPD exacerbation ([place])  Tobacco abuse   Pulmonary nodule   Community acquired pneumonia   Chronic back pain greater than 3 months duration   Leukocytosis   GERD (gastroesophageal reflux disease)   Hyponatremia   Hypercholesterolemia   Elevated brain natriuretic peptide (BNP) level   Elevated troponin   Anxiety   Thrombocytosis (HCC)    Plan: Continue treatments.  Check on echocardiogram when available.  Check sputum for AFB.  Incentive spirometer.  Flutter valve.  Nutritional supplements.    LOS: 2 days   Dmonte Maher L 01/28/2017, 9:48 AM

## 2017-01-29 ENCOUNTER — Encounter (HOSPITAL_COMMUNITY): Payer: Self-pay | Admitting: Radiology

## 2017-01-29 LAB — BASIC METABOLIC PANEL
ANION GAP: 10 (ref 5–15)
BUN: 16 mg/dL (ref 6–20)
CALCIUM: 8.8 mg/dL — AB (ref 8.9–10.3)
CO2: 32 mmol/L (ref 22–32)
CREATININE: 0.42 mg/dL — AB (ref 0.61–1.24)
Chloride: 92 mmol/L — ABNORMAL LOW (ref 101–111)
GFR calc Af Amer: >60 mL/min (ref >60)
GFR calc non Af Amer: >60 mL/min (ref >60)
GLUCOSE: 82 mg/dL (ref 65–99)
Potassium: 4.2 mmol/L (ref 3.5–5.1)
Sodium: 134 mmol/L — ABNORMAL LOW (ref 135–145)

## 2017-01-29 LAB — CBC WITH DIFFERENTIAL/PLATELET
Basophils Absolute: 0.1 10*3/uL (ref 0.0–0.1)
Basophils Relative: 0 %
EOS PCT: 0 %
Eosinophils Absolute: 0.1 10*3/uL (ref 0.0–0.7)
HEMATOCRIT: 44.9 % (ref 39.0–52.0)
Hemoglobin: 13.3 g/dL (ref 13.0–17.0)
LYMPHS ABS: 3.1 10*3/uL (ref 0.7–4.0)
LYMPHS PCT: 15 %
MCH: 28.6 pg (ref 26.0–34.0)
MCHC: 29.6 g/dL — AB (ref 30.0–36.0)
MCV: 96.6 fL (ref 78.0–100.0)
MONO ABS: 2.1 10*3/uL (ref 0.1–1.0)
MONOS PCT: 10 %
NEUTROS ABS: 15.2 10*3/uL (ref 1.7–7.7)
Neutrophils Relative %: 75 %
PLATELETS: ADEQUATE 10*3/uL (ref 150–400)
RBC: 4.65 MIL/uL (ref 4.22–5.81)
RDW: 14.1 % (ref 11.5–15.5)
WBC: 20.5 10*3/uL — ABNORMAL HIGH (ref 4.0–10.5)

## 2017-01-29 LAB — VANCOMYCIN, TROUGH: Vancomycin Tr: 5 ug/mL — ABNORMAL LOW (ref 15–20)

## 2017-01-29 MED ORDER — ALPRAZOLAM 0.25 MG PO TABS: 0.2500 mg | ORAL_TABLET | Freq: Two times a day (BID) | ORAL | Status: DC

## 2017-01-29 MED ORDER — VANCOMYCIN HCL IN DEXTROSE 750-5 MG/150ML-% IV SOLN
750.0000 mg | Freq: Three times a day (TID) | INTRAVENOUS | Status: DC
  Filled 2017-01-29 (×4): qty 150

## 2017-01-29 NOTE — Progress Notes (Addendum)
Subjective: Cameron Villarreal was admitted with pneumonia and acute on chronic hypoxic respiratory failure.  Cameron Villarreal is been very lethargic and this morning Cameron Villarreal was found to have Percocet in his sock and Cameron Villarreal has a pill crusher and a straw so I suspect that Cameron Villarreal has been crushing his Percocet and snorting it.  Cameron Villarreal filled Percocet prescription 2 days ago and in the bottle that Cameron Villarreal brought with him to the hospital there is less than half of his prescription remaining.  Objective: Vital signs in last 24 hours: Temp:  [97.4 F (36.3 C)-98.1 F (36.7 C)] 97.8 F (36.6 C) (12/23 0719) Pulse Rate:  [72-100] 91 (12/23 0900) Resp:  [11-20] 19 (12/23 0900) BP: (82-126)/(29-106) 105/80 (12/23 0900) SpO2:  [85 %-100 %] 85 % (12/23 0900) Weight change:  Last BM Date: (PTA)  Intake/Output from previous day: 12/22 0701 - 12/23 0700 In: 1020 [P.O.:720; IV Piggyback:300] Out: 1300 [Urine:1300]  PHYSICAL EXAM General appearance: Sleepy, very thin Resp: rhonchi bilaterally Cardio: regular rate and rhythm, S1, S2 normal, no murmur, click, rub or gallop GI: soft, non-tender; bowel sounds normal; no masses,  no organomegaly Extremities: extremities normal, atraumatic, no cyanosis or edema Skin warm and dry  Lab Results:  Results for orders placed or performed during the hospital encounter of 01/26/17 (from the past 48 hour(s))  CBC WITH DIFFERENTIAL     Status: Abnormal   Collection Time: 01/28/17  4:20 AM  Result Value Ref Range   WBC 22.8 (H) 4.0 - 10.5 K/uL   RBC 4.47 4.22 - 5.81 MIL/uL   Hemoglobin 12.6 (L) 13.0 - 17.0 g/dL   HCT 43.1 39.0 - 52.0 %   MCV 96.4 78.0 - 100.0 fL   MCH 28.2 26.0 - 34.0 pg   MCHC 29.2 (L) 30.0 - 36.0 g/dL   RDW 14.0 11.5 - 15.5 %   Platelets 528 (H) 150 - 400 K/uL   Neutrophils Relative % 90 %   Lymphocytes Relative 5 %   Monocytes Relative 5 %   Eosinophils Relative 0 %   Basophils Relative 0 %   Neutro Abs 20.6 (H) 1.7 - 7.7 K/uL   Lymphs Abs 1.1 0.7 - 4.0 K/uL   Monocytes  Absolute 1.1 (H) 0.1 - 1.0 K/uL   Eosinophils Absolute 0.0 0.0 - 0.7 K/uL   Basophils Absolute 0.0 0.0 - 0.1 K/uL   WBC Morphology ATYPICAL LYMPHOCYTES     Comment: VACUOLATED NEUTROPHILS  Basic metabolic panel     Status: Abnormal   Collection Time: 01/28/17  4:20 AM  Result Value Ref Range   Sodium 133 (L) 135 - 145 mmol/L   Potassium 4.5 3.5 - 5.1 mmol/L   Chloride 89 (L) 101 - 111 mmol/L   CO2 33 (H) 22 - 32 mmol/L   Glucose, Bld 160 (H) 65 - 99 mg/dL   BUN 13 6 - 20 mg/dL   Creatinine, Ser 0.48 (L) 0.61 - 1.24 mg/dL   Calcium 9.1 8.9 - 10.3 mg/dL   GFR calc non Af Amer >60 >60 mL/min   GFR calc Af Amer >60 >60 mL/min    Comment: (NOTE) The eGFR has been calculated using the CKD EPI equation. This calculation has not been validated in all clinical situations. eGFR's persistently <60 mL/min signify possible Chronic Kidney Disease.    Anion gap 11 5 - 15  Urinalysis, Routine w reflex microscopic     Status: Abnormal   Collection Time: 01/28/17  3:51 PM  Result Value Ref Range  Color, Urine YELLOW YELLOW   APPearance HAZY (A) CLEAR   Specific Gravity, Urine 1.023 1.005 - 1.030   pH 6.0 5.0 - 8.0   Glucose, UA NEGATIVE NEGATIVE mg/dL   Hgb urine dipstick NEGATIVE NEGATIVE   Bilirubin Urine NEGATIVE NEGATIVE   Ketones, ur NEGATIVE NEGATIVE mg/dL   Protein, ur NEGATIVE NEGATIVE mg/dL   Nitrite NEGATIVE NEGATIVE   Leukocytes, UA NEGATIVE NEGATIVE  Strep pneumoniae urinary antigen     Status: None   Collection Time: 01/28/17  3:51 PM  Result Value Ref Range   Strep Pneumo Urinary Antigen NEGATIVE NEGATIVE    Comment:        Infection due to S. pneumoniae cannot be absolutely ruled out since the antigen present may be below the detection limit of the test. Performed at [facility], [address] [place], [place] [address]   CBC WITH DIFFERENTIAL     Status: Abnormal   Collection Time: 01/29/17  4:00 AM  Result Value Ref Range   WBC 20.5 (H) 4.0 - 10.5 K/uL     Comment: WHITE COUNT CONFIRMED ON SMEAR   RBC 4.65 4.22 - 5.81 MIL/uL   Hemoglobin 13.3 13.0 - 17.0 g/dL   HCT 44.9 39.0 - 52.0 %   MCV 96.6 78.0 - 100.0 fL   MCH 28.6 26.0 - 34.0 pg   MCHC 29.6 (L) 30.0 - 36.0 g/dL   RDW 14.1 11.5 - 15.5 %   Platelets  150 - 400 K/uL    PLATELET CLUMPS NOTED ON SMEAR, COUNT APPEARS ADEQUATE   Neutrophils Relative % 75 %   Neutro Abs 15.2 1.7 - 7.7 K/uL   Lymphocytes Relative 15 %   Lymphs Abs 3.1 0.7 - 4.0 K/uL   Monocytes Relative 10 %   Monocytes Absolute 2.1 0.1 - 1.0 K/uL   Eosinophils Relative 0 %   Eosinophils Absolute 0.1 0.0 - 0.7 K/uL   Basophils Relative 0 %   Basophils Absolute 0.1 0.0 - 0.1 K/uL  Basic metabolic panel     Status: Abnormal   Collection Time: 01/29/17  4:00 AM  Result Value Ref Range   Sodium 134 (L) 135 - 145 mmol/L   Potassium 4.2 3.5 - 5.1 mmol/L   Chloride 92 (L) 101 - 111 mmol/L   CO2 32 22 - 32 mmol/L   Glucose, Bld 82 65 - 99 mg/dL   BUN 16 6 - 20 mg/dL   Creatinine, Ser 0.42 (L) 0.61 - 1.24 mg/dL   Calcium 8.8 (L) 8.9 - 10.3 mg/dL   GFR calc non Af Amer >60 >60 mL/min   GFR calc Af Amer >60 >60 mL/min    Comment: (NOTE) The eGFR has been calculated using the CKD EPI equation. This calculation has not been validated in all clinical situations. eGFR's persistently <60 mL/min signify possible Chronic Kidney Disease.    Anion gap 10 5 - 15    ABGS Recent Labs    01/26/17 1930  PHART 7.338*  PO2ART 96.5  HCO3 27.8   CULTURES Recent Results (from the past 240 hour(s))  Blood Culture (routine x 2)     Status: None (Preliminary result)   Collection Time: 01/26/17  7:14 PM  Result Value Ref Range Status   Specimen Description LEFT ANTECUBITAL  Final   Special Requests   Final    BOTTLES DRAWN AEROBIC AND ANAEROBIC Blood Culture adequate volume   Culture NO GROWTH 3 DAYS  Final   Report Status PENDING  Incomplete  Blood Culture (routine x 2)     Status: None (Preliminary result)   Collection  Time: 01/26/17  7:14 PM  Result Value Ref Range Status   Specimen Description BLOOD RIGHT FOREARM  Final   Special Requests   Final    BOTTLES DRAWN AEROBIC AND ANAEROBIC Blood Culture adequate volume   Culture NO GROWTH 3 DAYS  Final   Report Status PENDING  Incomplete  MRSA PCR Screening     Status: None   Collection Time: 01/26/17  9:28 PM  Result Value Ref Range Status   MRSA by PCR NEGATIVE NEGATIVE Final    Comment:        The GeneXpert MRSA Assay (FDA approved for NASAL specimens only), is one component of a comprehensive MRSA colonization surveillance program. It is not intended to diagnose MRSA infection nor to guide or monitor treatment for MRSA infections.    Studies/Results: Ct Chest W Contrast  Result Date: 01/27/2017 CLINICAL DATA:  Chest and abdominal pain. Pleuritic chest pain, productive cough with wheezing, sputum and worsening dyspnea. Palpitation. 20 pound weight loss over the last couple months. Also history of COPD, hypertension, hypercholesterolemia, anxiety, GERD, tobacco abuse, abnormal EKG and positive troponin. EXAM: CT CHEST, ABDOMEN, AND PELVIS WITH CONTRAST TECHNIQUE: Multidetector CT imaging of the chest, abdomen and pelvis was performed following the standard protocol during bolus administration of intravenous contrast. CONTRAST:  130m ISOVUE-300 IOPAMIDOL (ISOVUE-300) INJECTION 61% COMPARISON:  CT abdomen dated 04/29/2010. Chest x-ray dated 01/26/2017. FINDINGS: CT CHEST FINDINGS Cardiovascular: Heart size is normal. No pericardial effusion. Coronary artery calcifications noted. No thoracic aortic aneurysm or dissection. Mediastinum/Nodes: No mass or enlarged lymph nodes within the mediastinum or perihilar regions. Esophagus appears normal. Trachea and central bronchi are unremarkable. Lungs/Pleura: Irregular nodular consolidation within the right lower lobe, measuring 1.3 x 1.1 cm (series 3, image 92). Scattered areas of tree-in-bud pattern within the  periphery of each lung, most prominent in the right lower lobe. Pleural-parenchymal scarring/fibrosis at each lung apex. Advanced emphysematous changes bilaterally, upper lobe predominant. Additional small consolidations at each lung base, most likely atelectasis. Associated debris within the peripheral bronchial segments to the right lower lobe. Bilateral bronchial wall thickening, lower lobe predominant, indicating bronchitis, acute or chronic. Musculoskeletal: No acute or suspicious osseous finding. CT ABDOMEN PELVIS FINDINGS Hepatobiliary: No focal liver abnormality is seen. Status post cholecystectomy. No biliary dilatation. Pancreas: Unremarkable. No pancreatic ductal dilatation or surrounding inflammatory changes. Spleen: Normal in size without focal abnormality. Adrenals/Urinary Tract: Adrenal glands appear normal. Kidneys are unremarkable without mass, stone or hydronephrosis. No ureteral or bladder calculi identified. Bladder appears normal. Stomach/Bowel: No dilated large or small bowel loops. No bowel wall thickening or evidence of bowel wall inflammation seen. Fairly large amount of stool in the right colon. Appendix appears normal. Vascular/Lymphatic: Aortic atherosclerosis. No enlarged abdominal or pelvic lymph nodes. Reproductive: Prostate is unremarkable. Other: No free fluid or abscess collection identified. No free intraperitoneal air. There has been an interval reduction of the intra-abdominal fat compatible with the given history of weight loss. Musculoskeletal: No acute or suspicious osseous finding. Mild degenerative change in the lumbar spine. IMPRESSION: 1. Irregular pulmonary nodule, versus nodular consolidation, within the right lower lobe, measuring 1.3 cm. Differential considerations include neoplastic nodule, pneumonia, nodular atelectasis and confluent nodular scarring/fibrosis. The tree in bud pattern within the periphery of the right lower lobe could represent associated  endobronchial spread of pneumonia or tumor. Consider one of the following in 3 months for both low-risk  and high-risk individuals: (a) repeat chest CT, (b) follow-up PET-CT, or (c) tissue sampling. The lesion does appear amenable to CT-guided percutaneous biopsy. This recommendation follows the consensus statement: Guidelines for Management of Incidental Pulmonary Nodules Detected on CT Images: From the Fleischner Society 2017; Radiology 2017; 284:228-243. 2. Additional areas of tree-in-bud pattern throughout both lungs, again most prominent in the right lower lobe. This is suspicious for infectious bronchiolitis, most commonly related to atypical pneumonia such as viral, fungal, MAC and pulmonary tuberculosis (tuberculosis considered much less likely given the lower lobe distribution). 3. Emphysema, moderate to severe in degree, upper lobe predominant. 4. Bronchitic changes, of uncertain acuity. 5. Small bibasilar consolidations, most likely atelectasis. 6. No acute findings within the abdomen or pelvis. No evidence of neoplastic process in the abdomen or pelvis. Decreased intra-abdominal fat, compatible with the given history of unintentional weight loss. 7. Aortic atherosclerosis. 8. Coronary artery calcifications. Electronically Signed   By: Franki Cabot M.D.   On: 01/27/2017 13:49   Ct Abdomen Pelvis W Contrast  Result Date: 01/27/2017 CLINICAL DATA:  Chest and abdominal pain. Pleuritic chest pain, productive cough with wheezing, sputum and worsening dyspnea. Palpitation. 20 pound weight loss over the last couple months. Also history of COPD, hypertension, hypercholesterolemia, anxiety, GERD, tobacco abuse, abnormal EKG and positive troponin. EXAM: CT CHEST, ABDOMEN, AND PELVIS WITH CONTRAST TECHNIQUE: Multidetector CT imaging of the chest, abdomen and pelvis was performed following the standard protocol during bolus administration of intravenous contrast. CONTRAST:  122m ISOVUE-300 IOPAMIDOL  (ISOVUE-300) INJECTION 61% COMPARISON:  CT abdomen dated 04/29/2010. Chest x-ray dated 01/26/2017. FINDINGS: CT CHEST FINDINGS Cardiovascular: Heart size is normal. No pericardial effusion. Coronary artery calcifications noted. No thoracic aortic aneurysm or dissection. Mediastinum/Nodes: No mass or enlarged lymph nodes within the mediastinum or perihilar regions. Esophagus appears normal. Trachea and central bronchi are unremarkable. Lungs/Pleura: Irregular nodular consolidation within the right lower lobe, measuring 1.3 x 1.1 cm (series 3, image 92). Scattered areas of tree-in-bud pattern within the periphery of each lung, most prominent in the right lower lobe. Pleural-parenchymal scarring/fibrosis at each lung apex. Advanced emphysematous changes bilaterally, upper lobe predominant. Additional small consolidations at each lung base, most likely atelectasis. Associated debris within the peripheral bronchial segments to the right lower lobe. Bilateral bronchial wall thickening, lower lobe predominant, indicating bronchitis, acute or chronic. Musculoskeletal: No acute or suspicious osseous finding. CT ABDOMEN PELVIS FINDINGS Hepatobiliary: No focal liver abnormality is seen. Status post cholecystectomy. No biliary dilatation. Pancreas: Unremarkable. No pancreatic ductal dilatation or surrounding inflammatory changes. Spleen: Normal in size without focal abnormality. Adrenals/Urinary Tract: Adrenal glands appear normal. Kidneys are unremarkable without mass, stone or hydronephrosis. No ureteral or bladder calculi identified. Bladder appears normal. Stomach/Bowel: No dilated large or small bowel loops. No bowel wall thickening or evidence of bowel wall inflammation seen. Fairly large amount of stool in the right colon. Appendix appears normal. Vascular/Lymphatic: Aortic atherosclerosis. No enlarged abdominal or pelvic lymph nodes. Reproductive: Prostate is unremarkable. Other: No free fluid or abscess collection  identified. No free intraperitoneal air. There has been an interval reduction of the intra-abdominal fat compatible with the given history of weight loss. Musculoskeletal: No acute or suspicious osseous finding. Mild degenerative change in the lumbar spine. IMPRESSION: 1. Irregular pulmonary nodule, versus nodular consolidation, within the right lower lobe, measuring 1.3 cm. Differential considerations include neoplastic nodule, pneumonia, nodular atelectasis and confluent nodular scarring/fibrosis. The tree in bud pattern within the periphery of the right lower lobe could represent associated endobronchial spread  of pneumonia or tumor. Consider one of the following in 3 months for both low-risk and high-risk individuals: (a) repeat chest CT, (b) follow-up PET-CT, or (c) tissue sampling. The lesion does appear amenable to CT-guided percutaneous biopsy. This recommendation follows the consensus statement: Guidelines for Management of Incidental Pulmonary Nodules Detected on CT Images: From the Fleischner Society 2017; Radiology 2017; 284:228-243. 2. Additional areas of tree-in-bud pattern throughout both lungs, again most prominent in the right lower lobe. This is suspicious for infectious bronchiolitis, most commonly related to atypical pneumonia such as viral, fungal, MAC and pulmonary tuberculosis (tuberculosis considered much less likely given the lower lobe distribution). 3. Emphysema, moderate to severe in degree, upper lobe predominant. 4. Bronchitic changes, of uncertain acuity. 5. Small bibasilar consolidations, most likely atelectasis. 6. No acute findings within the abdomen or pelvis. No evidence of neoplastic process in the abdomen or pelvis. Decreased intra-abdominal fat, compatible with the given history of unintentional weight loss. 7. Aortic atherosclerosis. 8. Coronary artery calcifications. Electronically Signed   By: Franki Cabot M.D.   On: 01/27/2017 13:49    Medications:  Prior to Admission:   Medications Prior to Admission  Medication Sig Dispense Refill Last Dose  . albuterol (PROVENTIL) (2.5 MG/3ML) 0.083% nebulizer solution Take 2.5 mg by nebulization every 4 (four) hours as needed for wheezing or shortness of breath.    01/26/2017 at Unknown time  . ALPRAZolam (XANAX) 1 MG tablet Take 1 mg by mouth 3 (three) times daily.   01/26/2017 at Unknown time  . ENSURE (ENSURE) Take 237 mLs by mouth 2 (two) times daily between meals.   01/26/2017 at Unknown time  . Fluticasone-Salmeterol (ADVAIR DISKUS) 250-50 MCG/DOSE AEPB Inhale 1 puff into the lungs 2 (two) times daily. 60 each 12 01/26/2017 at Unknown time  . oxyCODONE-acetaminophen (PERCOCET) 10-325 MG tablet Take 1 tablet by mouth every 4 (four) hours as needed for pain. 150 tablet 0 01/26/2017 at Unknown time  . PROAIR HFA 108 (90 Base) MCG/ACT inhaler Inhale 1-2 puffs into the lungs every 6 (six) hours as needed for wheezing or shortness of breath.    01/26/2017 at Unknown time  . traZODone (DESYREL) 50 MG tablet Take 50 mg by mouth at bedtime.   01/25/2017 at Unknown time   Scheduled: . ALPRAZolam  1 mg Oral TID  . enoxaparin (LOVENOX) injection  40 mg Subcutaneous Q24H  . feeding supplement  1 Container Oral TID BM  . feeding supplement (ENSURE ENLIVE)  237 mL Oral BID BM  . ipratropium-albuterol  3 mL Nebulization Q6H WA  . multivitamin with minerals  1 tablet Oral Daily  . nicotine  14 mg Transdermal Daily  . traZODone  50 mg Oral QHS   Continuous: . albuterol 10 mg/hr (01/26/17 2038)  . azithromycin Stopped (01/29/17 0612)  . cefTRIAXone (ROCEPHIN)  IV Stopped (01/29/17 0330)  . vancomycin Stopped (01/28/17 2204)   UMP:NTIRWERXV, LORazepam, oxyCODONE-acetaminophen **AND** oxyCODONE  Assesment: Cameron Villarreal has acute hypoxic and hypercapnic respiratory failure.  Cameron Villarreal has community-acquired pneumonia.  Cameron Villarreal has COPD at baseline.  Cameron Villarreal is better as far as his respiratory status is concerned.  Cameron Villarreal has had significant issues with back  pain but now it is clear that Cameron Villarreal is abusing his pain medication and this will be stopped.  Cameron Villarreal said significant issues with anxiety and had been on Xanax but now with the current situation and is obvious abuse this will be reduced to the lowest dose to keep him from having withdrawal seizures Principal  Problem:   Acute respiratory failure with hypercapnia (HCC) Active Problems:   COPD exacerbation (HCC)   Tobacco abuse   Pulmonary nodule   Community acquired pneumonia   Chronic back pain greater than 3 months duration   Leukocytosis   GERD (gastroesophageal reflux disease)   Hyponatremia   Hypercholesterolemia   Elevated brain natriuretic peptide (BNP) level   Elevated troponin   Anxiety   Thrombocytosis ([place])    Plan: As above.  Discussed with pharmacy and they feel that we cannot keep the pills that Cameron Villarreal had from the legal prescription.  I discussed this with his mother and told her that Cameron Villarreal is abusing his medication and that all of his prescriptions will be canceled.  I will be happy to help treat his lung situation but obviously will no longer prescribe him any controlled substances.  I will call his drug store and cancel any refills    LOS: 3 days   Luke Falero L 01/29/2017, 9:35 AM

## 2017-01-29 NOTE — Progress Notes (Signed)
Upon giving bath this morning Tech found 8 yellow pills inside tissue paper in patients sock.  Dr. Luan Pulling informed of this and it was believed that this was the reason the patient was so lethargic yesterday.  When patient got out of bed it was also found that he had a pill crusher and straw with pill residue in them.  Patient had personal prescription for pain medication from home wrapped up in tissue and inside [employer] bag.  Prescription was filled on 01/27/17 and 2/3 of the pills were already used out of 180.  Security was informed and searched the rest of his belongings.  Patient refused to stay after talking to Dr. Luan Pulling who stated he was going to have to stop his pain medication. Patient signed AMA form and called ride.  Security escorted patient downstairs in wheelchair.  Belongings returned to patient.

## 2017-01-30 DIAGNOSIS — F1910 Other psychoactive substance abuse, uncomplicated: Secondary | ICD-10-CM | POA: Diagnosis present

## 2017-01-30 NOTE — Discharge Summary (Signed)
Physician Discharge Summary  Patient ID: Cameron Villarreal MRN: [id number] DOB/AGE: 13-Mar-1965 51 y.o. y.o. Primary Care Physician:[name], [name], MD Admit date: 01/26/2017 Discharge date: 01/30/2017    Discharge Diagnoses:   Principal Problem:   Acute respiratory failure with hypercapnia ([place]) Active Problems:   COPD exacerbation (HCC)   Tobacco abuse   Pulmonary nodule   Community acquired pneumonia   Chronic back pain greater than 3 months duration   Leukocytosis   GERD (gastroesophageal reflux disease)   Hyponatremia   Hypercholesterolemia   Elevated brain natriuretic peptide (BNP) level   Elevated troponin   Anxiety   Thrombocytosis (HCC)   Drug abuse ([place]) Unintentional weight loss  Allergies as of 01/29/2017      Reactions   Codeine Itching, Nausea Only      Medication List    ASK your doctor about these medications   ALPRAZolam 1 MG tablet Commonly known as:  XANAX Take 1 mg by mouth 3 (three) times daily.   ENSURE Take 237 mLs by mouth 2 (two) times daily between meals.   Fluticasone-Salmeterol 250-50 MCG/DOSE Aepb Commonly known as:  ADVAIR DISKUS Inhale 1 puff into the lungs 2 (two) times daily.   oxyCODONE-acetaminophen 10-325 MG tablet Commonly known as:  PERCOCET Take 1 tablet by mouth every 4 (four) hours as needed for pain.   PROAIR HFA 108 (90 Base) MCG/ACT inhaler Generic drug:  albuterol Inhale 1-2 puffs into the lungs every 6 (six) hours as needed for wheezing or shortness of breath.   albuterol (2.5 MG/3ML) 0.083% nebulizer solution Commonly known as:  PROVENTIL Take 2.5 mg by nebulization every 4 (four) hours as needed for wheezing or shortness of breath.   traZODone 50 MG tablet Commonly known as:  DESYREL Take 50 mg by mouth at bedtime.       Discharged Condition: Unchanged    Consults: Cardiology  Significant Diagnostic Studies: Ct Chest W Contrast  Result Date: 01/27/2017 CLINICAL DATA:  Chest and abdominal pain.  Pleuritic chest pain, productive cough with wheezing, sputum and worsening dyspnea. Palpitation. 20 pound weight loss over the last couple months. Also history of COPD, hypertension, hypercholesterolemia, anxiety, GERD, tobacco abuse, abnormal EKG and positive troponin. EXAM: CT CHEST, ABDOMEN, AND PELVIS WITH CONTRAST TECHNIQUE: Multidetector CT imaging of the chest, abdomen and pelvis was performed following the standard protocol during bolus administration of intravenous contrast. CONTRAST:  122m ISOVUE-300 IOPAMIDOL (ISOVUE-300) INJECTION 61% COMPARISON:  CT abdomen dated 04/29/2010. Chest x-ray dated 01/26/2017. FINDINGS: CT CHEST FINDINGS Cardiovascular: Heart size is normal. No pericardial effusion. Coronary artery calcifications noted. No thoracic aortic aneurysm or dissection. Mediastinum/Nodes: No mass or enlarged lymph nodes within the mediastinum or perihilar regions. Esophagus appears normal. Trachea and central bronchi are unremarkable. Lungs/Pleura: Irregular nodular consolidation within the right lower lobe, measuring 1.3 x 1.1 cm (series 3, image 92). Scattered areas of tree-in-bud pattern within the periphery of each lung, most prominent in the right lower lobe. Pleural-parenchymal scarring/fibrosis at each lung apex. Advanced emphysematous changes bilaterally, upper lobe predominant. Additional small consolidations at each lung base, most likely atelectasis. Associated debris within the peripheral bronchial segments to the right lower lobe. Bilateral bronchial wall thickening, lower lobe predominant, indicating bronchitis, acute or chronic. Musculoskeletal: No acute or suspicious osseous finding. CT ABDOMEN PELVIS FINDINGS Hepatobiliary: No focal liver abnormality is seen. Status post cholecystectomy. No biliary dilatation. Pancreas: Unremarkable. No pancreatic ductal dilatation or surrounding inflammatory changes. Spleen: Normal in size without focal abnormality. Adrenals/Urinary Tract: Adrenal  glands appear normal.  Kidneys are unremarkable without mass, stone or hydronephrosis. No ureteral or bladder calculi identified. Bladder appears normal. Stomach/Bowel: No dilated large or small bowel loops. No bowel wall thickening or evidence of bowel wall inflammation seen. Fairly large amount of stool in the right colon. Appendix appears normal. Vascular/Lymphatic: Aortic atherosclerosis. No enlarged abdominal or pelvic lymph nodes. Reproductive: Prostate is unremarkable. Other: No free fluid or abscess collection identified. No free intraperitoneal air. There has been an interval reduction of the intra-abdominal fat compatible with the given history of weight loss. Musculoskeletal: No acute or suspicious osseous finding. Mild degenerative change in the lumbar spine. IMPRESSION: 1. Irregular pulmonary nodule, versus nodular consolidation, within the right lower lobe, measuring 1.3 cm. Differential considerations include neoplastic nodule, pneumonia, nodular atelectasis and confluent nodular scarring/fibrosis. The tree in bud pattern within the periphery of the right lower lobe could represent associated endobronchial spread of pneumonia or tumor. Consider one of the following in 3 months for both low-risk and high-risk individuals: (a) repeat chest CT, (b) follow-up PET-CT, or (c) tissue sampling. The lesion does appear amenable to CT-guided percutaneous biopsy. This recommendation follows the consensus statement: Guidelines for Management of Incidental Pulmonary Nodules Detected on CT Images: From the Fleischner Society 2017; Radiology 2017; 284:228-243. 2. Additional areas of tree-in-bud pattern throughout both lungs, again most prominent in the right lower lobe. This is suspicious for infectious bronchiolitis, most commonly related to atypical pneumonia such as viral, fungal, MAC and pulmonary tuberculosis (tuberculosis considered much less likely given the lower lobe distribution). 3. Emphysema, moderate to  severe in degree, upper lobe predominant. 4. Bronchitic changes, of uncertain acuity. 5. Small bibasilar consolidations, most likely atelectasis. 6. No acute findings within the abdomen or pelvis. No evidence of neoplastic process in the abdomen or pelvis. Decreased intra-abdominal fat, compatible with the given history of unintentional weight loss. 7. Aortic atherosclerosis. 8. Coronary artery calcifications. Electronically Signed   By: Franki Cabot M.D.   On: 01/27/2017 13:49   Ct Abdomen Pelvis W Contrast  Result Date: 01/27/2017 CLINICAL DATA:  Chest and abdominal pain. Pleuritic chest pain, productive cough with wheezing, sputum and worsening dyspnea. Palpitation. 20 pound weight loss over the last couple months. Also history of COPD, hypertension, hypercholesterolemia, anxiety, GERD, tobacco abuse, abnormal EKG and positive troponin. EXAM: CT CHEST, ABDOMEN, AND PELVIS WITH CONTRAST TECHNIQUE: Multidetector CT imaging of the chest, abdomen and pelvis was performed following the standard protocol during bolus administration of intravenous contrast. CONTRAST:  158m ISOVUE-300 IOPAMIDOL (ISOVUE-300) INJECTION 61% COMPARISON:  CT abdomen dated 04/29/2010. Chest x-ray dated 01/26/2017. FINDINGS: CT CHEST FINDINGS Cardiovascular: Heart size is normal. No pericardial effusion. Coronary artery calcifications noted. No thoracic aortic aneurysm or dissection. Mediastinum/Nodes: No mass or enlarged lymph nodes within the mediastinum or perihilar regions. Esophagus appears normal. Trachea and central bronchi are unremarkable. Lungs/Pleura: Irregular nodular consolidation within the right lower lobe, measuring 1.3 x 1.1 cm (series 3, image 92). Scattered areas of tree-in-bud pattern within the periphery of each lung, most prominent in the right lower lobe. Pleural-parenchymal scarring/fibrosis at each lung apex. Advanced emphysematous changes bilaterally, upper lobe predominant. Additional small consolidations at  each lung base, most likely atelectasis. Associated debris within the peripheral bronchial segments to the right lower lobe. Bilateral bronchial wall thickening, lower lobe predominant, indicating bronchitis, acute or chronic. Musculoskeletal: No acute or suspicious osseous finding. CT ABDOMEN PELVIS FINDINGS Hepatobiliary: No focal liver abnormality is seen. Status post cholecystectomy. No biliary dilatation. Pancreas: Unremarkable. No pancreatic ductal dilatation or surrounding  inflammatory changes. Spleen: Normal in size without focal abnormality. Adrenals/Urinary Tract: Adrenal glands appear normal. Kidneys are unremarkable without mass, stone or hydronephrosis. No ureteral or bladder calculi identified. Bladder appears normal. Stomach/Bowel: No dilated large or small bowel loops. No bowel wall thickening or evidence of bowel wall inflammation seen. Fairly large amount of stool in the right colon. Appendix appears normal. Vascular/Lymphatic: Aortic atherosclerosis. No enlarged abdominal or pelvic lymph nodes. Reproductive: Prostate is unremarkable. Other: No free fluid or abscess collection identified. No free intraperitoneal air. There has been an interval reduction of the intra-abdominal fat compatible with the given history of weight loss. Musculoskeletal: No acute or suspicious osseous finding. Mild degenerative change in the lumbar spine. IMPRESSION: 1. Irregular pulmonary nodule, versus nodular consolidation, within the right lower lobe, measuring 1.3 cm. Differential considerations include neoplastic nodule, pneumonia, nodular atelectasis and confluent nodular scarring/fibrosis. The tree in bud pattern within the periphery of the right lower lobe could represent associated endobronchial spread of pneumonia or tumor. Consider one of the following in 3 months for both low-risk and high-risk individuals: (a) repeat chest CT, (b) follow-up PET-CT, or (c) tissue sampling. The lesion does appear amenable to  CT-guided percutaneous biopsy. This recommendation follows the consensus statement: Guidelines for Management of Incidental Pulmonary Nodules Detected on CT Images: From the Fleischner Society 2017; Radiology 2017; 284:228-243. 2. Additional areas of tree-in-bud pattern throughout both lungs, again most prominent in the right lower lobe. This is suspicious for infectious bronchiolitis, most commonly related to atypical pneumonia such as viral, fungal, MAC and pulmonary tuberculosis (tuberculosis considered much less likely given the lower lobe distribution). 3. Emphysema, moderate to severe in degree, upper lobe predominant. 4. Bronchitic changes, of uncertain acuity. 5. Small bibasilar consolidations, most likely atelectasis. 6. No acute findings within the abdomen or pelvis. No evidence of neoplastic process in the abdomen or pelvis. Decreased intra-abdominal fat, compatible with the given history of unintentional weight loss. 7. Aortic atherosclerosis. 8. Coronary artery calcifications. Electronically Signed   By: Franki Cabot M.D.   On: 01/27/2017 13:49   Dg Chest Port 1 View  Result Date: 01/26/2017 CLINICAL DATA:  Increasing shortness of breath and cough for 3 days. EXAM: PORTABLE CHEST 1 VIEW COMPARISON:  01/29/2016 and prior chest radiograph FINDINGS: The cardiomediastinal silhouette is unremarkable. COPD/emphysema changes again noted. Slightly increased interstitial opacities are identified and may represent mild edema versus interstitial infection. No pleural effusion or pneumothorax. No other significant change identified. IMPRESSION: Slightly increased interstitial opacities which may represent mild edema versus interstitial infection. COPD/emphysema. Electronically Signed   By: Margarette Canada M.D.   On: 01/26/2017 19:29    Lab Results: Basic Metabolic Panel: Recent Labs    01/28/17 0420 01/29/17 0400  NA 133* 134*  K 4.5 4.2  CL 89* 92*  CO2 33* 32  GLUCOSE 160* 82  BUN 13 16   CREATININE 0.48* 0.42*  CALCIUM 9.1 8.8*   Liver Function Tests: No results for input(s): AST, ALT, ALKPHOS, BILITOT, PROT, ALBUMIN in the last 72 hours.   CBC: Recent Labs    01/28/17 0420 01/29/17 0400  WBC 22.8* 20.5*  NEUTROABS 20.6* 15.2  HGB 12.6* 13.3  HCT 43.1 44.9  MCV 96.4 96.6  PLT 528* PLATELET CLUMPS NOTED ON SMEAR, COUNT APPEARS ADEQUATE    Recent Results (from the past 240 hour(s))  Blood Culture (routine x 2)     Status: None (Preliminary result)   Collection Time: 01/26/17  7:14 PM  Result Value Ref Range  Status   Specimen Description LEFT ANTECUBITAL  Final   Special Requests   Final    BOTTLES DRAWN AEROBIC AND ANAEROBIC Blood Culture adequate volume   Culture NO GROWTH 4 DAYS  Final   Report Status PENDING  Incomplete  Blood Culture (routine x 2)     Status: None (Preliminary result)   Collection Time: 01/26/17  7:14 PM  Result Value Ref Range Status   Specimen Description BLOOD RIGHT FOREARM  Final   Special Requests   Final    BOTTLES DRAWN AEROBIC AND ANAEROBIC Blood Culture adequate volume   Culture NO GROWTH 4 DAYS  Final   Report Status PENDING  Incomplete  MRSA PCR Screening     Status: None   Collection Time: 01/26/17  9:28 PM  Result Value Ref Range Status   MRSA by PCR NEGATIVE NEGATIVE Final    Comment:        The GeneXpert MRSA Assay (FDA approved for NASAL specimens only), is one component of a comprehensive MRSA colonization surveillance program. It is not intended to diagnose MRSA infection nor to guide or monitor treatment for MRSA infections.      Hospital Course: This is a 51 year old who came to the emergency room because of increasing shortness acute on chronic hypoxic and hypercapnic respiratory failure and probable pneumonia.  Cameron Villarreal was started on intravenous antibiotics Cameron Villarreal had elevated troponin level and elevated BNP and his chest x-ray was at least somewhat suggestive that Cameron Villarreal might have some heart failure so cardiology  was consulted.  Cameron Villarreal was lethargic on the second hospital day and then on the third hospital day Cameron Villarreal was found to have hidden Percocet in his sock also was found to have a pill crusher and Stra[place].  Cameron Villarreal admitted that Cameron Villarreal had been snorting his Percocet from home.  Cameron Villarreal claims that the majority of his medication is at home and Cameron Villarreal only brought part of his prescription with him.  I told him that this was clearly drug abuse that I would discontinue prescribing him any controlled substances but that I would be glad to continue trying to help him with lung disease but since I am the only pulmonologist in the county.  I also offered counseling on drug abuse which Cameron Villarreal refused.  I told him I thought Cameron Villarreal should stay in the hospital and let us help him with his breathing which Cameron Villarreal refused.  Cameron Villarreal signed out [place].  I will alert the local pharmacies.  I discussed this with his mother by telephone  Discharge Exam: Blood pressure 105/80, pulse 91, temperature 97.8 F (36.6 C), temperature source Axillary, resp. rate 19, height _0  (1.727 m), weight 54.3 kg (119 lb 11.4 oz), SpO2 (!) 85 %. Awake and alert still with rhonchi.  Disposition: Home AGAINST MEDICAL ADVICE      Signed: Alonza Bogus   01/30/2017, 8:31 AM

## 2017-01-31 LAB — CULTURE, BLOOD (ROUTINE X 2)
Culture: NO GROWTH
Culture: NO GROWTH
SPECIAL REQUESTS: ADEQUATE
Special Requests: ADEQUATE

## 2017-01-31 LAB — LEGIONELLA PNEUMOPHILA SEROGP 1 UR AG: L. PNEUMOPHILA SEROGP 1 UR AG: NEGATIVE

## 2017-02-08 DIAGNOSIS — J449 Chronic obstructive pulmonary disease, unspecified: Secondary | ICD-10-CM | POA: Diagnosis not present

## 2017-03-11 DIAGNOSIS — J449 Chronic obstructive pulmonary disease, unspecified: Secondary | ICD-10-CM | POA: Diagnosis not present

## 2017-04-08 DIAGNOSIS — J449 Chronic obstructive pulmonary disease, unspecified: Secondary | ICD-10-CM | POA: Diagnosis not present

## 2017-04-28 DIAGNOSIS — R69 Illness, unspecified: Secondary | ICD-10-CM | POA: Diagnosis not present

## 2017-05-03 DIAGNOSIS — R69 Illness, unspecified: Secondary | ICD-10-CM | POA: Diagnosis not present

## 2017-05-03 DIAGNOSIS — M545 Low back pain: Secondary | ICD-10-CM | POA: Diagnosis not present

## 2017-05-03 DIAGNOSIS — E46 Unspecified protein-calorie malnutrition: Secondary | ICD-10-CM | POA: Diagnosis not present

## 2017-05-03 DIAGNOSIS — J449 Chronic obstructive pulmonary disease, unspecified: Secondary | ICD-10-CM | POA: Diagnosis not present

## 2017-05-09 DIAGNOSIS — J449 Chronic obstructive pulmonary disease, unspecified: Secondary | ICD-10-CM | POA: Diagnosis not present

## 2017-05-12 ENCOUNTER — Other Ambulatory Visit (HOSPITAL_COMMUNITY): Payer: Self-pay | Admitting: Pulmonary Disease

## 2017-05-12 DIAGNOSIS — R911 Solitary pulmonary nodule: Secondary | ICD-10-CM

## 2017-05-16 DIAGNOSIS — M545 Low back pain: Secondary | ICD-10-CM | POA: Diagnosis not present

## 2017-05-16 DIAGNOSIS — Z79899 Other long term (current) drug therapy: Secondary | ICD-10-CM | POA: Diagnosis not present

## 2017-05-16 DIAGNOSIS — G8929 Other chronic pain: Secondary | ICD-10-CM | POA: Diagnosis not present

## 2017-05-30 ENCOUNTER — Ambulatory Visit (HOSPITAL_COMMUNITY)
Admission: RE | Admit: 2017-05-30 | Discharge: 2017-05-30 | Disposition: A | Discharge status: Home / Self Care | Payer: Medicare HMO | Source: Ambulatory Visit | Attending: Pulmonary Disease | Admitting: Pulmonary Disease

## 2017-05-30 DIAGNOSIS — J439 Emphysema, unspecified: Secondary | ICD-10-CM | POA: Diagnosis not present

## 2017-05-30 DIAGNOSIS — R911 Solitary pulmonary nodule: Secondary | ICD-10-CM

## 2017-05-30 MED ORDER — IOPAMIDOL (ISOVUE-300) INJECTION 61%
75.0000 mL | Freq: Once | INTRAVENOUS | Status: AC | PRN
  Administered 2017-05-30: 75 mL via INTRAVENOUS

## 2017-05-31 DIAGNOSIS — Z79899 Other long term (current) drug therapy: Secondary | ICD-10-CM | POA: Diagnosis not present

## 2017-05-31 DIAGNOSIS — M545 Low back pain: Secondary | ICD-10-CM | POA: Diagnosis not present

## 2017-05-31 DIAGNOSIS — G8929 Other chronic pain: Secondary | ICD-10-CM | POA: Diagnosis not present

## 2017-06-08 DIAGNOSIS — J449 Chronic obstructive pulmonary disease, unspecified: Secondary | ICD-10-CM | POA: Diagnosis not present

## 2017-06-13 DIAGNOSIS — M62552 Muscle wasting and atrophy, not elsewhere classified, left thigh: Secondary | ICD-10-CM | POA: Diagnosis not present

## 2017-06-13 DIAGNOSIS — M62551 Muscle wasting and atrophy, not elsewhere classified, right thigh: Secondary | ICD-10-CM | POA: Diagnosis not present

## 2017-06-13 DIAGNOSIS — M2560 Stiffness of unspecified joint, not elsewhere classified: Secondary | ICD-10-CM | POA: Diagnosis not present

## 2017-06-13 DIAGNOSIS — M545 Low back pain: Secondary | ICD-10-CM | POA: Diagnosis not present

## 2017-06-26 DIAGNOSIS — J439 Emphysema, unspecified: Secondary | ICD-10-CM | POA: Diagnosis not present

## 2017-06-26 DIAGNOSIS — Z809 Family history of malignant neoplasm, unspecified: Secondary | ICD-10-CM | POA: Diagnosis not present

## 2017-06-26 DIAGNOSIS — Z79891 Long term (current) use of opiate analgesic: Secondary | ICD-10-CM | POA: Diagnosis not present

## 2017-06-26 DIAGNOSIS — R69 Illness, unspecified: Secondary | ICD-10-CM | POA: Diagnosis not present

## 2017-06-26 DIAGNOSIS — G8929 Other chronic pain: Secondary | ICD-10-CM | POA: Diagnosis not present

## 2017-06-26 DIAGNOSIS — Z7951 Long term (current) use of inhaled steroids: Secondary | ICD-10-CM | POA: Diagnosis not present

## 2017-06-26 DIAGNOSIS — Z833 Family history of diabetes mellitus: Secondary | ICD-10-CM | POA: Diagnosis not present

## 2017-06-26 DIAGNOSIS — G4700 Insomnia, unspecified: Secondary | ICD-10-CM | POA: Diagnosis not present

## 2017-06-30 DIAGNOSIS — Z79899 Other long term (current) drug therapy: Secondary | ICD-10-CM | POA: Diagnosis not present

## 2017-06-30 DIAGNOSIS — M545 Low back pain: Secondary | ICD-10-CM | POA: Diagnosis not present

## 2017-06-30 DIAGNOSIS — G8929 Other chronic pain: Secondary | ICD-10-CM | POA: Diagnosis not present

## 2017-07-09 DIAGNOSIS — J449 Chronic obstructive pulmonary disease, unspecified: Secondary | ICD-10-CM | POA: Diagnosis not present

## 2017-08-03 DIAGNOSIS — M545 Low back pain: Secondary | ICD-10-CM | POA: Diagnosis not present

## 2017-08-03 DIAGNOSIS — E46 Unspecified protein-calorie malnutrition: Secondary | ICD-10-CM | POA: Diagnosis not present

## 2017-08-03 DIAGNOSIS — R69 Illness, unspecified: Secondary | ICD-10-CM | POA: Diagnosis not present

## 2017-08-03 DIAGNOSIS — J449 Chronic obstructive pulmonary disease, unspecified: Secondary | ICD-10-CM | POA: Diagnosis not present

## 2017-08-08 DIAGNOSIS — J449 Chronic obstructive pulmonary disease, unspecified: Secondary | ICD-10-CM | POA: Diagnosis not present

## 2017-09-08 DIAGNOSIS — J449 Chronic obstructive pulmonary disease, unspecified: Secondary | ICD-10-CM | POA: Diagnosis not present

## 2017-10-09 DIAGNOSIS — J449 Chronic obstructive pulmonary disease, unspecified: Secondary | ICD-10-CM | POA: Diagnosis not present

## 2017-11-02 DIAGNOSIS — M545 Low back pain: Secondary | ICD-10-CM | POA: Diagnosis not present

## 2017-11-02 DIAGNOSIS — J449 Chronic obstructive pulmonary disease, unspecified: Secondary | ICD-10-CM | POA: Diagnosis not present

## 2017-11-02 DIAGNOSIS — J9611 Chronic respiratory failure with hypoxia: Secondary | ICD-10-CM | POA: Diagnosis not present

## 2017-11-02 DIAGNOSIS — R69 Illness, unspecified: Secondary | ICD-10-CM | POA: Diagnosis not present

## 2017-11-08 DIAGNOSIS — J449 Chronic obstructive pulmonary disease, unspecified: Secondary | ICD-10-CM | POA: Diagnosis not present

## 2017-11-13 ENCOUNTER — Emergency Department (HOSPITAL_COMMUNITY): Payer: Medicare HMO

## 2017-11-13 ENCOUNTER — Encounter: Payer: Self-pay | Admitting: Surgery

## 2017-11-13 ENCOUNTER — Encounter (HOSPITAL_COMMUNITY): Payer: Self-pay | Admitting: Emergency Medicine

## 2017-11-13 ENCOUNTER — Emergency Department (HOSPITAL_COMMUNITY)
Admission: EM | Admit: 2017-11-13 | Discharge: 2017-11-14 | Disposition: A | Discharge status: Home / Self Care | Payer: Medicare HMO | Attending: Emergency Medicine | Admitting: Emergency Medicine

## 2017-11-13 DIAGNOSIS — S52611A Displaced fracture of right ulna styloid process, initial encounter for closed fracture: Secondary | ICD-10-CM | POA: Insufficient documentation

## 2017-11-13 DIAGNOSIS — S52561A Barton's fracture of right radius, initial encounter for closed fracture: Secondary | ICD-10-CM | POA: Diagnosis not present

## 2017-11-13 DIAGNOSIS — S5291XA Unspecified fracture of right forearm, initial encounter for closed fracture: Secondary | ICD-10-CM | POA: Diagnosis not present

## 2017-11-13 DIAGNOSIS — R000 Tachycardia, unspecified: Secondary | ICD-10-CM | POA: Diagnosis not present

## 2017-11-13 DIAGNOSIS — S199XXA Unspecified injury of neck, initial encounter: Secondary | ICD-10-CM | POA: Diagnosis not present

## 2017-11-13 DIAGNOSIS — S42211A Unspecified displaced fracture of surgical neck of right humerus, initial encounter for closed fracture: Secondary | ICD-10-CM | POA: Diagnosis not present

## 2017-11-13 DIAGNOSIS — F17210 Nicotine dependence, cigarettes, uncomplicated: Secondary | ICD-10-CM | POA: Insufficient documentation

## 2017-11-13 DIAGNOSIS — S3993XA Unspecified injury of pelvis, initial encounter: Secondary | ICD-10-CM | POA: Diagnosis not present

## 2017-11-13 DIAGNOSIS — S3991XA Unspecified injury of abdomen, initial encounter: Secondary | ICD-10-CM | POA: Diagnosis not present

## 2017-11-13 DIAGNOSIS — Z79899 Other long term (current) drug therapy: Secondary | ICD-10-CM | POA: Insufficient documentation

## 2017-11-13 DIAGNOSIS — R102 Pelvic and perineal pain: Secondary | ICD-10-CM | POA: Diagnosis not present

## 2017-11-13 DIAGNOSIS — Y939 Activity, unspecified: Secondary | ICD-10-CM | POA: Diagnosis not present

## 2017-11-13 DIAGNOSIS — R52 Pain, unspecified: Secondary | ICD-10-CM | POA: Diagnosis not present

## 2017-11-13 DIAGNOSIS — S42411A Displaced simple supracondylar fracture without intercondylar fracture of right humerus, initial encounter for closed fracture: Secondary | ICD-10-CM | POA: Insufficient documentation

## 2017-11-13 DIAGNOSIS — Y92410 Unspecified street and highway as the place of occurrence of the external cause: Secondary | ICD-10-CM | POA: Insufficient documentation

## 2017-11-13 DIAGNOSIS — S299XXA Unspecified injury of thorax, initial encounter: Secondary | ICD-10-CM | POA: Diagnosis not present

## 2017-11-13 DIAGNOSIS — S6991XA Unspecified injury of right wrist, hand and finger(s), initial encounter: Secondary | ICD-10-CM | POA: Diagnosis present

## 2017-11-13 DIAGNOSIS — M542 Cervicalgia: Secondary | ICD-10-CM | POA: Diagnosis not present

## 2017-11-13 DIAGNOSIS — Y999 Unspecified external cause status: Secondary | ICD-10-CM | POA: Diagnosis not present

## 2017-11-13 DIAGNOSIS — S80212A Abrasion, left knee, initial encounter: Secondary | ICD-10-CM | POA: Insufficient documentation

## 2017-11-13 DIAGNOSIS — R69 Illness, unspecified: Secondary | ICD-10-CM | POA: Diagnosis not present

## 2017-11-13 DIAGNOSIS — S0990XA Unspecified injury of head, initial encounter: Secondary | ICD-10-CM | POA: Insufficient documentation

## 2017-11-13 DIAGNOSIS — R0989 Other specified symptoms and signs involving the circulatory and respiratory systems: Secondary | ICD-10-CM | POA: Diagnosis not present

## 2017-11-13 DIAGNOSIS — S8992XA Unspecified injury of left lower leg, initial encounter: Secondary | ICD-10-CM | POA: Diagnosis not present

## 2017-11-13 DIAGNOSIS — M25562 Pain in left knee: Secondary | ICD-10-CM | POA: Diagnosis not present

## 2017-11-13 DIAGNOSIS — R0902 Hypoxemia: Secondary | ICD-10-CM | POA: Diagnosis not present

## 2017-11-13 DIAGNOSIS — R404 Transient alteration of awareness: Secondary | ICD-10-CM | POA: Diagnosis not present

## 2017-11-13 LAB — I-STAT CHEM 8, ED
BUN: 5 mg/dL — ABNORMAL LOW (ref 6–20)
CHLORIDE: 100 mmol/L (ref 98–111)
CREATININE: 0.6 mg/dL — AB (ref 0.61–1.24)
Calcium, Ion: 1.09 mmol/L — ABNORMAL LOW (ref 1.15–1.40)
GLUCOSE: 124 mg/dL — AB (ref 70–99)
HEMATOCRIT: 49 % (ref 39.0–52.0)
HEMOGLOBIN: 16.7 g/dL (ref 13.0–17.0)
POTASSIUM: 4 mmol/L (ref 3.5–5.1)
Sodium: 137 mmol/L (ref 135–145)
TCO2: 26 mmol/L (ref 22–32)

## 2017-11-13 LAB — TYPE AND SCREEN
ABO/RH(D): O POS
Antibody Screen: NEGATIVE

## 2017-11-13 LAB — CBC
HCT: 47 % (ref 39.0–52.0)
Hemoglobin: 14.8 g/dL (ref 13.0–17.0)
MCH: 29.2 pg (ref 26.0–34.0)
MCHC: 31.5 g/dL (ref 30.0–36.0)
MCV: 92.9 fL (ref 78.0–100.0)
PLATELETS: 301 10*3/uL (ref 150–400)
RBC: 5.06 MIL/uL (ref 4.22–5.81)
RDW: 14.6 % (ref 11.5–15.5)
WBC: 15.4 10*3/uL — ABNORMAL HIGH (ref 4.0–10.5)

## 2017-11-13 LAB — PROTIME-INR
INR: 1.06
Prothrombin Time: 13.8 seconds (ref 11.4–15.2)

## 2017-11-13 LAB — COMPREHENSIVE METABOLIC PANEL
ALK PHOS: 99 U/L (ref 38–126)
ALT: 16 U/L (ref 0–44)
AST: 27 U/L (ref 15–41)
Albumin: 3.4 g/dL — ABNORMAL LOW (ref 3.5–5.0)
Anion gap: 8 (ref 5–15)
BUN: 5 mg/dL — AB (ref 6–20)
CALCIUM: 8.9 mg/dL (ref 8.9–10.3)
CHLORIDE: 100 mmol/L (ref 98–111)
CO2: 27 mmol/L (ref 22–32)
CREATININE: 0.64 mg/dL (ref 0.61–1.24)
Glucose, Bld: 125 mg/dL — ABNORMAL HIGH (ref 70–99)
Potassium: 3.9 mmol/L (ref 3.5–5.1)
SODIUM: 135 mmol/L (ref 135–145)
Total Bilirubin: 0.5 mg/dL (ref 0.3–1.2)
Total Protein: 6.9 g/dL (ref 6.5–8.1)

## 2017-11-13 LAB — ETHANOL

## 2017-11-13 LAB — I-STAT CG4 LACTIC ACID, ED: Lactic Acid, Venous: 1.68 mmol/L (ref 0.5–1.9)

## 2017-11-13 MED ORDER — SODIUM CHLORIDE 0.9 % IV BOLUS: 1000.0000 mL | Freq: Once | INTRAVENOUS | Status: DC

## 2017-11-13 MED ORDER — HYDROMORPHONE HCL 1 MG/ML IJ SOLN
1.0000 mg | Freq: Once | INTRAMUSCULAR | Status: AC
  Administered 2017-11-13: 1 mg via INTRAVENOUS

## 2017-11-13 MED ORDER — SODIUM CHLORIDE 0.9 % IV SOLN
INTRAVENOUS | Status: AC | PRN
  Administered 2017-11-13: 1000 mL via INTRAVENOUS

## 2017-11-13 MED ORDER — FENTANYL CITRATE (PF) 100 MCG/2ML IJ SOLN
INTRAMUSCULAR | Status: AC
  Administered 2017-11-13: 21:00:00
  Filled 2017-11-13: qty 2

## 2017-11-13 MED ORDER — MORPHINE SULFATE (PF) 4 MG/ML IV SOLN
INTRAVENOUS | Status: AC
  Administered 2017-11-13: 4 mg
  Filled 2017-11-13: qty 1

## 2017-11-13 MED ORDER — HYDROMORPHONE HCL 1 MG/ML IJ SOLN
INTRAMUSCULAR | Status: AC
  Filled 2017-11-13: qty 1

## 2017-11-13 MED ORDER — MORPHINE SULFATE (PF) 4 MG/ML IV SOLN
4.0000 mg | Freq: Once | INTRAVENOUS | Status: AC
  Administered 2017-11-13: 4 mg via INTRAVENOUS

## 2017-11-13 MED ORDER — IOHEXOL 300 MG/ML  SOLN
100.0000 mL | Freq: Once | INTRAMUSCULAR | Status: AC | PRN
  Administered 2017-11-13: 100 mL via INTRAVENOUS
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
## 2017-11-13 NOTE — ED Notes (Signed)
X-ray technician at bedside.
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
## 2017-11-13 NOTE — ED Notes (Signed)
Patient transported to CT scan . 
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
## 2017-11-13 NOTE — ED Notes (Signed)
Patient transported back to CT scan.

## 2017-11-14 ENCOUNTER — Encounter: Payer: Self-pay | Admitting: Surgery

## 2017-11-14 DIAGNOSIS — R69 Illness, unspecified: Secondary | ICD-10-CM | POA: Diagnosis not present

## 2017-11-14 DIAGNOSIS — S0990XA Unspecified injury of head, initial encounter: Secondary | ICD-10-CM | POA: Diagnosis not present

## 2017-11-14 DIAGNOSIS — S80212A Abrasion, left knee, initial encounter: Secondary | ICD-10-CM | POA: Diagnosis not present

## 2017-11-14 DIAGNOSIS — S42411A Displaced simple supracondylar fracture without intercondylar fracture of right humerus, initial encounter for closed fracture: Secondary | ICD-10-CM | POA: Diagnosis not present

## 2017-11-14 DIAGNOSIS — S52611A Displaced fracture of right ulna styloid process, initial encounter for closed fracture: Secondary | ICD-10-CM | POA: Diagnosis not present

## 2017-11-14 DIAGNOSIS — Z79899 Other long term (current) drug therapy: Secondary | ICD-10-CM | POA: Diagnosis not present

## 2017-11-14 DIAGNOSIS — M542 Cervicalgia: Secondary | ICD-10-CM | POA: Diagnosis not present

## 2017-11-14 DIAGNOSIS — S52561A Barton's fracture of right radius, initial encounter for closed fracture: Secondary | ICD-10-CM | POA: Diagnosis not present

## 2017-11-14 LAB — CDS SEROLOGY

## 2017-11-14 LAB — ABO/RH: ABO/RH(D): O POS

## 2017-11-14 MED ORDER — MORPHINE SULFATE (PF) 4 MG/ML IV SOLN
4.0000 mg | Freq: Once | INTRAVENOUS | Status: AC
  Administered 2017-11-14: 4 mg via INTRAVENOUS
  Filled 2017-11-14: qty 1

## 2017-11-14 MED ORDER — OXYCODONE-ACETAMINOPHEN 5-325 MG PO TABS: 1.0000 | ORAL_TABLET | ORAL | 0 refills | Status: DC | PRN

## 2017-11-14 NOTE — ED Notes (Signed)
PAGED ORTHO for R arm splint

## 2017-11-14 NOTE — ED Notes (Signed)
Ortho tech at bedside applying right arm splint .

## 2017-11-16 ENCOUNTER — Ambulatory Visit: Payer: Self-pay | Admitting: Student

## 2017-11-16 ENCOUNTER — Encounter (HOSPITAL_COMMUNITY): Payer: Self-pay | Admitting: *Deleted

## 2017-11-16 ENCOUNTER — Other Ambulatory Visit: Payer: Self-pay

## 2017-11-16 DIAGNOSIS — S42491A Other displaced fracture of lower end of right humerus, initial encounter for closed fracture: Secondary | ICD-10-CM

## 2017-11-16 DIAGNOSIS — M25531 Pain in right wrist: Secondary | ICD-10-CM | POA: Diagnosis not present

## 2017-11-16 DIAGNOSIS — M25521 Pain in right elbow: Secondary | ICD-10-CM | POA: Diagnosis not present

## 2017-11-16 DIAGNOSIS — S42401A Unspecified fracture of lower end of right humerus, initial encounter for closed fracture: Secondary | ICD-10-CM | POA: Insufficient documentation

## 2017-11-17 ENCOUNTER — Encounter (HOSPITAL_COMMUNITY)
Admission: AD | Disposition: A | Discharge status: Home / Self Care | Payer: Self-pay | Source: Ambulatory Visit | Attending: Student

## 2017-11-17 ENCOUNTER — Ambulatory Visit (HOSPITAL_COMMUNITY): Payer: Medicare HMO

## 2017-11-17 ENCOUNTER — Ambulatory Visit (HOSPITAL_COMMUNITY)
Admission: AD | Admit: 2017-11-17 | Discharge: 2017-11-17 | Disposition: A | Discharge status: Home / Self Care | Payer: Medicare HMO | Source: Ambulatory Visit | Attending: Student | Admitting: Student

## 2017-11-17 ENCOUNTER — Ambulatory Visit (HOSPITAL_COMMUNITY): Payer: Medicare HMO | Admitting: Certified Registered Nurse Anesthetist

## 2017-11-17 ENCOUNTER — Encounter (HOSPITAL_COMMUNITY): Payer: Self-pay | Admitting: Anesthesiology

## 2017-11-17 DIAGNOSIS — G8918 Other acute postprocedural pain: Secondary | ICD-10-CM | POA: Diagnosis not present

## 2017-11-17 DIAGNOSIS — J432 Centrilobular emphysema: Secondary | ICD-10-CM | POA: Insufficient documentation

## 2017-11-17 DIAGNOSIS — S42401A Unspecified fracture of lower end of right humerus, initial encounter for closed fracture: Secondary | ICD-10-CM

## 2017-11-17 DIAGNOSIS — S52511A Displaced fracture of right radial styloid process, initial encounter for closed fracture: Secondary | ICD-10-CM

## 2017-11-17 DIAGNOSIS — S52591D Other fractures of lower end of right radius, subsequent encounter for closed fracture with routine healing: Secondary | ICD-10-CM | POA: Diagnosis not present

## 2017-11-17 DIAGNOSIS — J449 Chronic obstructive pulmonary disease, unspecified: Secondary | ICD-10-CM | POA: Diagnosis not present

## 2017-11-17 DIAGNOSIS — S52501A Unspecified fracture of the lower end of right radius, initial encounter for closed fracture: Secondary | ICD-10-CM | POA: Diagnosis not present

## 2017-11-17 DIAGNOSIS — Z419 Encounter for procedure for purposes other than remedying health state, unspecified: Secondary | ICD-10-CM

## 2017-11-17 DIAGNOSIS — F419 Anxiety disorder, unspecified: Secondary | ICD-10-CM | POA: Diagnosis not present

## 2017-11-17 DIAGNOSIS — Z7951 Long term (current) use of inhaled steroids: Secondary | ICD-10-CM | POA: Diagnosis not present

## 2017-11-17 DIAGNOSIS — Z9981 Dependence on supplemental oxygen: Secondary | ICD-10-CM | POA: Insufficient documentation

## 2017-11-17 DIAGNOSIS — Z79899 Other long term (current) drug therapy: Secondary | ICD-10-CM | POA: Diagnosis not present

## 2017-11-17 DIAGNOSIS — S52611A Displaced fracture of right ulna styloid process, initial encounter for closed fracture: Secondary | ICD-10-CM | POA: Diagnosis not present

## 2017-11-17 DIAGNOSIS — F17210 Nicotine dependence, cigarettes, uncomplicated: Secondary | ICD-10-CM | POA: Insufficient documentation

## 2017-11-17 DIAGNOSIS — S42411A Displaced simple supracondylar fracture without intercondylar fracture of right humerus, initial encounter for closed fracture: Secondary | ICD-10-CM | POA: Diagnosis not present

## 2017-11-17 DIAGNOSIS — R69 Illness, unspecified: Secondary | ICD-10-CM | POA: Diagnosis not present

## 2017-11-17 DIAGNOSIS — S42491A Other displaced fracture of lower end of right humerus, initial encounter for closed fracture: Secondary | ICD-10-CM

## 2017-11-17 DIAGNOSIS — S42291D Other displaced fracture of upper end of right humerus, subsequent encounter for fracture with routine healing: Secondary | ICD-10-CM | POA: Diagnosis not present

## 2017-11-17 DIAGNOSIS — T148XXA Other injury of unspecified body region, initial encounter: Secondary | ICD-10-CM

## 2017-11-17 HISTORY — DX: Dyspnea, unspecified: R06.00

## 2017-11-17 HISTORY — PX: ORIF HUMERUS FRACTURE: SHX2126

## 2017-11-17 HISTORY — DX: Pneumonia, unspecified organism: J18.9

## 2017-11-17 HISTORY — PX: OPEN REDUCTION INTERNAL FIXATION (ORIF) DISTAL RADIAL FRACTURE: SHX5989

## 2017-11-17 LAB — SURGICAL PCR SCREEN
MRSA, PCR: POSITIVE — AB
Staphylococcus aureus: POSITIVE — AB

## 2017-11-17 SURGERY — OPEN REDUCTION INTERNAL FIXATION (ORIF) DISTAL HUMERUS FRACTURE
Anesthesia: General | Laterality: Right

## 2017-11-17 MED ORDER — CEFAZOLIN SODIUM-DEXTROSE 2-4 GM/100ML-% IV SOLN
2.0000 g | INTRAVENOUS | Status: AC
  Administered 2017-11-17: 2 g via INTRAVENOUS
  Filled 2017-11-17: qty 100

## 2017-11-17 MED ORDER — MIDAZOLAM HCL 2 MG/2ML IJ SOLN
INTRAMUSCULAR | Status: AC
  Filled 2017-11-17: qty 2

## 2017-11-17 MED ORDER — ROCURONIUM BROMIDE 50 MG/5ML IV SOSY
PREFILLED_SYRINGE | INTRAVENOUS | Status: AC
  Filled 2017-11-17: qty 5

## 2017-11-17 MED ORDER — ONDANSETRON HCL 4 MG/2ML IJ SOLN
INTRAMUSCULAR | Status: AC
  Filled 2017-11-17: qty 2

## 2017-11-17 MED ORDER — LACTATED RINGERS IV SOLN
INTRAVENOUS | Status: DC | PRN
  Administered 2017-11-17 (×2): via INTRAVENOUS

## 2017-11-17 MED ORDER — METHOCARBAMOL 750 MG PO TABS: 750.0000 mg | ORAL_TABLET | Freq: Four times a day (QID) | ORAL | 1 refills | Status: DC | PRN

## 2017-11-17 MED ORDER — PROPOFOL 10 MG/ML IV BOLUS
INTRAVENOUS | Status: DC | PRN
  Administered 2017-11-17: 120 mg via INTRAVENOUS

## 2017-11-17 MED ORDER — POVIDONE-IODINE 10 % EX SWAB: 2.0000 "application " | Freq: Once | CUTANEOUS | Status: DC

## 2017-11-17 MED ORDER — EPHEDRINE SULFATE-NACL 50-0.9 MG/10ML-% IV SOSY
PREFILLED_SYRINGE | INTRAVENOUS | Status: DC | PRN
  Administered 2017-11-17: 5 mg via INTRAVENOUS
  Administered 2017-11-17: 15 mg via INTRAVENOUS
  Administered 2017-11-17 (×3): 10 mg via INTRAVENOUS

## 2017-11-17 MED ORDER — PHENYLEPHRINE 40 MCG/ML (10ML) SYRINGE FOR IV PUSH (FOR BLOOD PRESSURE SUPPORT)
PREFILLED_SYRINGE | INTRAVENOUS | Status: DC | PRN
  Administered 2017-11-17 (×5): 80 ug via INTRAVENOUS

## 2017-11-17 MED ORDER — ONDANSETRON HCL 4 MG/2ML IJ SOLN
INTRAMUSCULAR | Status: DC | PRN
  Administered 2017-11-17: 4 mg via INTRAVENOUS

## 2017-11-17 MED ORDER — SODIUM CHLORIDE 0.9 % IV SOLN
INTRAVENOUS | Status: DC | PRN
  Administered 2017-11-17: 25 ug/min via INTRAVENOUS

## 2017-11-17 MED ORDER — LIDOCAINE 2% (20 MG/ML) 5 ML SYRINGE
INTRAMUSCULAR | Status: DC | PRN
  Administered 2017-11-17: 60 mg via INTRAVENOUS

## 2017-11-17 MED ORDER — BACITRACIN 500 UNIT/GM EX OINT
TOPICAL_OINTMENT | CUTANEOUS | Status: DC | PRN
  Administered 2017-11-17: 1 via TOPICAL

## 2017-11-17 MED ORDER — EPHEDRINE 5 MG/ML INJ
INTRAVENOUS | Status: AC
  Filled 2017-11-17: qty 10

## 2017-11-17 MED ORDER — DEXAMETHASONE SODIUM PHOSPHATE 10 MG/ML IJ SOLN
INTRAMUSCULAR | Status: DC | PRN
  Administered 2017-11-17: 10 mg via INTRAVENOUS

## 2017-11-17 MED ORDER — FENTANYL CITRATE (PF) 250 MCG/5ML IJ SOLN
INTRAMUSCULAR | Status: DC | PRN
  Administered 2017-11-17 (×3): 50 ug via INTRAVENOUS

## 2017-11-17 MED ORDER — 0.9 % SODIUM CHLORIDE (POUR BTL) OPTIME
TOPICAL | Status: DC | PRN
  Administered 2017-11-17: 1000 mL

## 2017-11-17 MED ORDER — MIDAZOLAM HCL 5 MG/5ML IJ SOLN
INTRAMUSCULAR | Status: DC | PRN
  Administered 2017-11-17: 2 mg via INTRAVENOUS

## 2017-11-17 MED ORDER — LIDOCAINE 2% (20 MG/ML) 5 ML SYRINGE
INTRAMUSCULAR | Status: AC
  Filled 2017-11-17: qty 5

## 2017-11-17 MED ORDER — VANCOMYCIN HCL 1000 MG IV SOLR
INTRAVENOUS | Status: DC | PRN
  Administered 2017-11-17 (×2): 1000 mg

## 2017-11-17 MED ORDER — ALBUTEROL SULFATE HFA 108 (90 BASE) MCG/ACT IN AERS
INHALATION_SPRAY | RESPIRATORY_TRACT | Status: DC | PRN
  Administered 2017-11-17 (×2): 2 via RESPIRATORY_TRACT

## 2017-11-17 MED ORDER — PROPOFOL 10 MG/ML IV BOLUS
INTRAVENOUS | Status: AC
  Filled 2017-11-17: qty 40

## 2017-11-17 MED ORDER — OXYCODONE-ACETAMINOPHEN 10-325 MG PO TABS: 1.0000 | ORAL_TABLET | ORAL | 0 refills | Status: DC | PRN

## 2017-11-17 MED ORDER — VANCOMYCIN HCL 1000 MG IV SOLR
INTRAVENOUS | Status: AC
  Filled 2017-11-17: qty 2000

## 2017-11-17 MED ORDER — BACITRACIN ZINC 500 UNIT/GM EX OINT
TOPICAL_OINTMENT | CUTANEOUS | Status: AC
  Filled 2017-11-17: qty 28.35

## 2017-11-17 MED ORDER — CHLORHEXIDINE GLUCONATE 4 % EX LIQD: 60.0000 mL | Freq: Once | CUTANEOUS | Status: DC

## 2017-11-17 MED ORDER — DEXAMETHASONE SODIUM PHOSPHATE 10 MG/ML IJ SOLN
INTRAMUSCULAR | Status: AC
  Filled 2017-11-17: qty 1

## 2017-11-17 MED ORDER — ROCURONIUM BROMIDE 10 MG/ML (PF) SYRINGE
PREFILLED_SYRINGE | INTRAVENOUS | Status: DC | PRN
  Administered 2017-11-17: 50 mg via INTRAVENOUS

## 2017-11-17 MED ORDER — PHENYLEPHRINE 40 MCG/ML (10ML) SYRINGE FOR IV PUSH (FOR BLOOD PRESSURE SUPPORT)
PREFILLED_SYRINGE | INTRAVENOUS | Status: AC
  Filled 2017-11-17: qty 10

## 2017-11-17 MED ORDER — FENTANYL CITRATE (PF) 250 MCG/5ML IJ SOLN
INTRAMUSCULAR | Status: AC
  Filled 2017-11-17: qty 5

## 2017-11-17 MED ORDER — ROPIVACAINE HCL 7.5 MG/ML IJ SOLN
INTRAMUSCULAR | Status: DC | PRN
  Administered 2017-11-17 (×4): 5 mL via PERINEURAL

## 2017-11-17 MED ORDER — SUGAMMADEX SODIUM 200 MG/2ML IV SOLN
INTRAVENOUS | Status: DC | PRN
  Administered 2017-11-17: 200 mg via INTRAVENOUS

## 2017-11-17 SURGICAL SUPPLY — 84 items
BIT DRILL 2.5 X LONG (BIT) ×1
BIT DRILL CANN 2.7X625 NONSTRL (BIT) ×2 IMPLANT
BIT DRILL LCP QC 2X140 (BIT) ×2 IMPLANT
BIT DRILL PERC QC 2.8X200 100 (BIT) ×1 IMPLANT
BIT DRILL X LONG 2.5 (BIT) ×1 IMPLANT
BNDG ESMARK 4X9 LF (GAUZE/BANDAGES/DRESSINGS) ×2 IMPLANT
BNDG GAUZE ELAST 4 BULKY (GAUZE/BANDAGES/DRESSINGS) ×4 IMPLANT
BRUSH SCRUB SURG 4.25 DISP (MISCELLANEOUS) ×4 IMPLANT
CORDS BIPOLAR (ELECTRODE) IMPLANT
COVER SURGICAL LIGHT HANDLE (MISCELLANEOUS) ×4 IMPLANT
COVER WAND RF STERILE (DRAPES) ×2 IMPLANT
DRAIN PENROSE 1/4X12 LTX STRL (WOUND CARE) IMPLANT
DRAPE C-ARM 42X72 X-RAY (DRAPES) ×2 IMPLANT
DRAPE C-ARMOR (DRAPES) ×2 IMPLANT
DRAPE HALF SHEET 40X57 (DRAPES) ×2 IMPLANT
DRAPE INCISE IOBAN 66X45 STRL (DRAPES) IMPLANT
DRAPE ORTHO SPLIT 77X108 STRL (DRAPES)
DRAPE SURG ORHT 6 SPLT 77X108 (DRAPES) IMPLANT
DRAPE U-SHAPE 47X51 STRL (DRAPES) ×4 IMPLANT
DRILL BIT QUICK COUP 2.8MM 100 (BIT) ×1
DRILL BIT X LONG 2.5 (BIT) ×1
DRSG ADAPTIC 3X8 NADH LF (GAUZE/BANDAGES/DRESSINGS) ×6 IMPLANT
DRSG EMULSION OIL 3X3 NADH (GAUZE/BANDAGES/DRESSINGS) ×6 IMPLANT
DRSG PAD ABDOMINAL 8X10 ST (GAUZE/BANDAGES/DRESSINGS) ×2 IMPLANT
ELECT REM PT RETURN 9FT ADLT (ELECTROSURGICAL) ×2
ELECTRODE REM PT RTRN 9FT ADLT (ELECTROSURGICAL) ×1 IMPLANT
EVACUATOR 1/8 PVC DRAIN (DRAIN) IMPLANT
GAUZE SPONGE 4X4 12PLY STRL (GAUZE/BANDAGES/DRESSINGS) ×4 IMPLANT
GLOVE BIO SURGEON STRL SZ7.5 (GLOVE) IMPLANT
GLOVE BIOGEL PI IND STRL 7.5 (GLOVE) IMPLANT
GLOVE BIOGEL PI IND STRL 8 (GLOVE) ×1 IMPLANT
GLOVE BIOGEL PI INDICATOR 7.5 (GLOVE)
GLOVE BIOGEL PI INDICATOR 8 (GLOVE) ×1
GOWN STRL REUS W/ TWL LRG LVL3 (GOWN DISPOSABLE) ×2 IMPLANT
GOWN STRL REUS W/ TWL XL LVL3 (GOWN DISPOSABLE) ×1 IMPLANT
GOWN STRL REUS W/TWL LRG LVL3 (GOWN DISPOSABLE) ×2
GOWN STRL REUS W/TWL XL LVL3 (GOWN DISPOSABLE) ×1
GUIDEWARE NON THREAD 1.25X150 (WIRE) ×4
GUIDEWIRE NON THREAD 1.25X150 (WIRE) ×2 IMPLANT
KIT BASIN OR (CUSTOM PROCEDURE TRAY) ×2 IMPLANT
KIT TURNOVER KIT B (KITS) ×2 IMPLANT
MANIFOLD NEPTUNE II (INSTRUMENTS) ×2 IMPLANT
NEEDLE HYPO 25X1 1.5 SAFETY (NEEDLE) ×2 IMPLANT
NS IRRIG 1000ML POUR BTL (IV SOLUTION) ×2 IMPLANT
PACK ORTHO EXTREMITY (CUSTOM PROCEDURE TRAY) ×2 IMPLANT
PAD ARMBOARD 7.5X6 YLW CONV (MISCELLANEOUS) ×4 IMPLANT
PAD CAST 4YDX4 CTTN HI CHSV (CAST SUPPLIES) ×2 IMPLANT
PADDING CAST COTTON 4X4 STRL (CAST SUPPLIES) ×2
PADDING CAST COTTON 6X4 STRL (CAST SUPPLIES) ×4 IMPLANT
PLATE BONE LOCK 2H 82MM MED (Plate) ×2 IMPLANT
PLATE BONE LOCK 3H 75MM SHORT (Plate) ×2 IMPLANT
SCREW CANN L THRD/40 4.0 (Screw) ×4 IMPLANT
SCREW CORT HEADED ST 3.5X26 (Screw) ×2 IMPLANT
SCREW CORT HEADED ST 3.5X30 (Screw) ×2 IMPLANT
SCREW CORT HEADED ST 3.5X32 (Screw) ×2 IMPLANT
SCREW HEADED ST 3.5X34 (Screw) ×2 IMPLANT
SCREW LOCK T15 FT 24X3.5X2.9X (Screw) ×1 IMPLANT
SCREW LOCK T8 22X2.7XST VA (Screw) ×1 IMPLANT
SCREW LOCK VA ST 2.7X26 (Screw) ×2 IMPLANT
SCREW LOCKING 2.7X22MM (Screw) ×1 IMPLANT
SCREW LOCKING 2.7X28 (Screw) ×4 IMPLANT
SCREW LOCKING 3.5X24 (Screw) ×1 IMPLANT
SCREW LOCKING VA 2.7X38 (Screw) ×2 IMPLANT
SCREW LOCKING VA 2.7X40MM (Screw) ×2 IMPLANT
SCREW LOCKING VA 2.7X46 (Screw) ×2 IMPLANT
SCREW VA LOCKING 2.7X36 (Screw) ×1 IMPLANT
SCREW VA LOCKING 2.7X36 VA (Screw) ×1 IMPLANT
SPONGE LAP 18X18 X RAY DECT (DISPOSABLE) ×2 IMPLANT
STAPLER VISISTAT 35W (STAPLE) ×2 IMPLANT
STOCKINETTE IMPERVIOUS 9X36 MD (GAUZE/BANDAGES/DRESSINGS) IMPLANT
SUCTION FRAZIER HANDLE 10FR (MISCELLANEOUS)
SUCTION TUBE FRAZIER 10FR DISP (MISCELLANEOUS) IMPLANT
SUT ETHILON 3 0 PS 1 (SUTURE) ×6 IMPLANT
SUT VIC AB 0 CT1 27 (SUTURE)
SUT VIC AB 0 CT1 27XBRD ANBCTR (SUTURE) IMPLANT
SUT VIC AB 2-0 CT1 27 (SUTURE) ×4
SUT VIC AB 2-0 CT1 TAPERPNT 27 (SUTURE) ×4 IMPLANT
SYR 5ML LL (SYRINGE) IMPLANT
SYR CONTROL 10ML LL (SYRINGE) ×2 IMPLANT
TOWEL OR 17X24 6PK STRL BLUE (TOWEL DISPOSABLE) ×2 IMPLANT
TOWEL OR 17X26 10 PK STRL BLUE (TOWEL DISPOSABLE) ×6 IMPLANT
TRAY FOLEY MTR SLVR 16FR STAT (SET/KITS/TRAYS/PACK) ×2 IMPLANT
WATER STERILE IRR 1000ML POUR (IV SOLUTION) ×2 IMPLANT
YANKAUER SUCT BULB TIP NO VENT (SUCTIONS) IMPLANT

## 2017-11-17 NOTE — Transfer of Care (Signed)
Immediate Anesthesia Transfer of Care Note  Patient: Cameron Villarreal  Procedure(s) Performed: OPEN REDUCTION INTERNAL FIXATION (ORIF) DISTAL HUMERUS FRACTURE (Right ) OPEN REDUCTION INTERNAL FIXATION (ORIF) DISTAL RADIAL FRACTURE (Right )  Patient Location: PACU  Anesthesia Type:General combined with regional     Level of Consciousness: awake, drowsy and responds to stimulation  Airway & Oxygen Therapy: Patient Spontanous Breathing and Patient connected to nasal cannula oxygen  Post-op Assessment: Report given to RN and Post -op Vital signs reviewed and stable  Post vital signs: Reviewed and stable  Last Vitals:  Vitals Value Taken Time  BP 108/71 11/17/2017 11:21 AM  Temp    Pulse 95 11/17/2017 11:22 AM  Resp 19 11/17/2017 11:22 AM  SpO2 98 % 11/17/2017 11:22 AM  Vitals shown include unvalidated device data.  Last Pain:  Vitals:   11/17/17 0603  PainSc: 10-Worst pain ever      Patients Stated Pain Goal: 6 (70/17/79 3903)  Complications: No apparent anesthesia complications

## 2017-11-17 NOTE — Anesthesia Preprocedure Evaluation (Addendum)
Anesthesia Evaluation  Patient identified by MRN, date of birth, ID band Patient awake    Reviewed: Allergy & Precautions, NPO status , Patient's Chart, lab work & pertinent test results  Airway Mallampati: I       Dental no notable dental hx. (+) Teeth Intact   Pulmonary Current Smoker,    Pulmonary exam normal breath sounds clear to auscultation       Cardiovascular Normal cardiovascular exam Rhythm:Regular Rate:Normal     Neuro/Psych PSYCHIATRIC DISORDERS Anxiety    GI/Hepatic Neg liver ROS,   Endo/Other  negative endocrine ROS  Renal/GU negative Renal ROS  negative genitourinary   Musculoskeletal   Abdominal Normal abdominal exam  (+)   Peds  Hematology negative hematology ROS (+)   Anesthesia Other Findings   Reproductive/Obstetrics                            Anesthesia Physical Anesthesia Plan  ASA: II  Anesthesia Plan: General   Post-op Pain Management:  Regional for Post-op pain   Induction: Intravenous  PONV Risk Score and Plan: 1 and Ondansetron and Dexamethasone  Airway Management Planned: Oral ETT  Additional Equipment:   Intra-op Plan:   Post-operative Plan: Extubation in OR  Informed Consent: I have reviewed the patients History and Physical, chart, labs and discussed the procedure including the risks, benefits and alternatives for the proposed anesthesia with the patient or authorized representative who has indicated his/her understanding and acceptance.   Dental advisory given  Plan Discussed with:   Anesthesia Plan Comments:        Anesthesia Quick Evaluation

## 2017-11-17 NOTE — Progress Notes (Signed)
Orthopedic Tech Progress Note Patient Details:  Cameron Villarreal 11-30-1965 [id number]  Ortho Devices Type of Ortho Device: Arm sling Ortho Device/Splint Interventions: Application   Post Interventions Patient Tolerated: Well Instructions Provided: Care of device   [name] 11/17/2017, 12:25 PM
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
## 2017-11-17 NOTE — Anesthesia Procedure Notes (Signed)
Anesthesia Regional Block: Supraclavicular block   Pre-Anesthetic Checklist: ,, timeout performed, Correct Patient, Correct Site, Correct Laterality, Correct Procedure, Correct Position, site marked, Risks and benefits discussed,  Surgical consent,  Pre-op evaluation,  At surgeon's request and post-op pain management  Laterality: Right and Upper  Prep: chloraprep, alcohol swabs       Needles:  Injection technique: Single-shot  Needle Type: Echogenic Stimulator Needle     Needle Length: 10cm  Needle Gauge: 21   Needle insertion depth: 2 cm   Additional Needles:   Procedures:,,,, ultrasound used (permanent image in chart),,,,  Narrative:  Start time: 11/17/2017 7:26 AM End time: 11/17/2017 7:36 AM Injection made incrementally with aspirations every 5 mL.  Performed by: Personally  Anesthesiologist: Lyn Hollingshead, MD

## 2017-11-17 NOTE — Anesthesia Procedure Notes (Signed)
Procedure Name: Intubation Date/Time: 11/17/2017 7:55 AM Performed by: Colin Benton, CRNA Pre-anesthesia Checklist: Patient identified, Emergency Drugs available, Suction available and Patient being monitored Patient Re-evaluated:Patient Re-evaluated prior to induction Oxygen Delivery Method: Circle system utilized Preoxygenation: Pre-oxygenation with 100% oxygen Induction Type: IV induction Ventilation: Mask ventilation without difficulty and Oral airway inserted - appropriate to patient size Laryngoscope Size: Sabra Heck and 2 Grade View: Grade I Tube type: Oral Tube size: 7.0 mm Number of attempts: 1 Airway Equipment and Method: Stylet Placement Confirmation: ETT inserted through vocal cords under direct vision,  positive ETCO2 and breath sounds checked- equal and bilateral Secured at: 23 cm Tube secured with: Tape Dental Injury: Teeth and Oropharynx as per pre-operative assessment
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
## 2017-11-17 NOTE — Anesthesia Postprocedure Evaluation (Signed)
Anesthesia Post Note  Patient: Cameron Villarreal  Procedure(s) Performed: OPEN REDUCTION INTERNAL FIXATION (ORIF) DISTAL HUMERUS FRACTURE (Right ) OPEN REDUCTION INTERNAL FIXATION (ORIF) DISTAL RADIAL FRACTURE (Right )     Patient location during evaluation: PACU Anesthesia Type: General Level of consciousness: sedated Pain management: pain level controlled Vital Signs Assessment: post-procedure vital signs reviewed and stable Respiratory status: spontaneous breathing Cardiovascular status: stable Postop Assessment: no apparent nausea or vomiting Anesthetic complications: no    Last Vitals:  Vitals:   11/17/17 1205 11/17/17 1235  BP: 111/86 108/75  Pulse: (!) 102 (!) 104  Resp: 18 20  Temp: (!) 36.3 C   SpO2: 100% 98%    Last Pain:  Vitals:   11/17/17 1235  PainSc: 0-No pain   Pain Goal: Patients Stated Pain Goal: 6 (11/17/17 0603)               [place]

## 2017-11-17 NOTE — Discharge Instructions (Addendum)
[facility] Discharge Instructions   General Discharge Instructions  WEIGHT BEARING STATUS:Nonweightbearing to right arm  RANGE OF MOTION/ACTIVITY:Start moving elbow as soon as possible  Wound Care: Keep dressing intact until follow up. Do not remove or get wet  DVT/PE prophylaxis:None needed.  Diet: as you were eating previously.  Can use over the counter stool softeners and bowel preparations, such as Miralax, to help with bowel movements.  Narcotics can be constipating.  Be sure to drink plenty of fluids  PAIN MEDICATION USE AND EXPECTATIONS  You have likely been given narcotic medications to help control your pain.  After a traumatic event that results in an fracture (broken bone) with or without surgery, it is ok to use narcotic pain medications to help control one's pain.  We understand that everyone responds to pain differently and each individual patient will be evaluated on a regular basis for the continued need for narcotic medications. Ideally, narcotic medication use should last no more than 6-8 weeks (coinciding with fracture healing).   As a patient it is your responsibility as well to monitor narcotic medication use and report the amount and frequency you use these medications when you come to your office visit.   We would also advise that if you are using narcotic medications, you should take a dose prior to therapy to maximize you participation.  IF YOU ARE ON NARCOTIC MEDICATIONS IT IS NOT PERMISSIBLE TO OPERATE A MOTOR VEHICLE (MOTORCYCLE/CAR/TRUCK/MOPED) OR HEAVY MACHINERY DO NOT MIX NARCOTICS WITH OTHER CNS (CENTRAL NERVOUS SYSTEM) DEPRESSANTS SUCH AS ALCOHOL   STOP SMOKING OR USING NICOTINE PRODUCTS!!!!  As discussed nicotine severely impairs your body's ability to heal surgical and traumatic wounds but also impairs bone healing.  Wounds and bone heal by forming microscopic blood vessels (angiogenesis) and nicotine is a vasoconstrictor (essentially,  shrinks blood vessels).  Therefore, if vasoconstriction occurs to these microscopic blood vessels they essentially disappear and are unable to deliver necessary nutrients to the healing tissue.  This is one modifiable factor that you can do to dramatically increase your chances of healing your injury.    (This means no smoking, no nicotine gum, patches, etc)  DO NOT USE NONSTEROIDAL ANTI-INFLAMMATORY DRUGS (NSAID'S)  Using products such as Advil (ibuprofen), Aleve (naproxen), Motrin (ibuprofen) for additional pain control during fracture healing can delay and/or prevent the healing response.  If you would like to take over the counter (OTC) medication, Tylenol (acetaminophen) is ok.  However, some narcotic medications that are given for pain control contain acetaminophen as well. Therefore, you should not exceed more than 4000 mg of tylenol in a day if you do not have liver disease.  Also note that there are may OTC medicines, such as cold medicines and allergy medicines that my contain tylenol as well.  If you have any questions about medications and/or interactions please ask your doctor/PA or your pharmacist.      ICE AND ELEVATE INJURED/OPERATIVE EXTREMITY  Using ice and elevating the injured extremity above your heart can help with swelling and pain control.  Icing in a pulsatile fashion, such as 20 minutes on and 20 minutes off, can be followed.    Do not place ice directly on skin. Make sure there is a barrier between to skin and the ice pack.    Using frozen items such as frozen peas works well as the conform nicely to the are that needs to be iced.  USE AN ACE WRAP OR TED HOSE FOR SWELLING CONTROL  In addition to icing and elevation, Ace wraps or TED hose are used to help limit and resolve swelling.  It is recommended to use Ace wraps or TED hose until you are informed to stop.    When using Ace Wraps start the wrapping distally (farthest away from the body) and wrap proximally (closer to the  body)   Example: If you had surgery on your leg or thing and you do not have a splint on, start the ace wrap at the toes and work your way up to the thigh        If you had surgery on your upper extremity and do not have a splint on, start the ace wrap at your fingers and work your way up to the upper arm  IF YOU ARE IN A SPLINT OR CAST DO NOT [place]   If your splint gets wet for any reason please contact the office immediately. You may shower in your splint or cast as long as you keep it dry.  This can be done by wrapping in a cast cover or garbage back (or similar)  Do Not stick any thing down your splint or cast such as pencils, money, or hangers to try and scratch yourself with.  If you feel itchy take benadryl as prescribed on the bottle for itching   CALL THE OFFICE WITH ANY QUESTIONS OR CONCERNS: [phone]

## 2017-11-20 ENCOUNTER — Encounter (HOSPITAL_COMMUNITY): Payer: Self-pay | Admitting: Student

## 2017-11-24 ENCOUNTER — Inpatient Hospital Stay (HOSPITAL_COMMUNITY)
Admission: EM | Admit: 2017-11-24 | Discharge: 2017-11-26 | DRG: 192 | Disposition: A | Discharge status: Home / Self Care | Payer: Medicare HMO | Attending: Internal Medicine | Admitting: Internal Medicine

## 2017-11-24 ENCOUNTER — Emergency Department (HOSPITAL_COMMUNITY): Payer: Medicare HMO

## 2017-11-24 ENCOUNTER — Other Ambulatory Visit: Payer: Self-pay

## 2017-11-24 ENCOUNTER — Encounter (HOSPITAL_COMMUNITY): Payer: Self-pay | Admitting: *Deleted

## 2017-11-24 DIAGNOSIS — F17210 Nicotine dependence, cigarettes, uncomplicated: Secondary | ICD-10-CM | POA: Diagnosis present

## 2017-11-24 DIAGNOSIS — K219 Gastro-esophageal reflux disease without esophagitis: Secondary | ICD-10-CM | POA: Diagnosis present

## 2017-11-24 DIAGNOSIS — Z7951 Long term (current) use of inhaled steroids: Secondary | ICD-10-CM

## 2017-11-24 DIAGNOSIS — E7800 Pure hypercholesterolemia, unspecified: Secondary | ICD-10-CM | POA: Diagnosis present

## 2017-11-24 DIAGNOSIS — J441 Chronic obstructive pulmonary disease with (acute) exacerbation: Secondary | ICD-10-CM | POA: Diagnosis not present

## 2017-11-24 DIAGNOSIS — Z720 Tobacco use: Secondary | ICD-10-CM | POA: Diagnosis not present

## 2017-11-24 DIAGNOSIS — Z79899 Other long term (current) drug therapy: Secondary | ICD-10-CM

## 2017-11-24 DIAGNOSIS — Z9981 Dependence on supplemental oxygen: Secondary | ICD-10-CM

## 2017-11-24 DIAGNOSIS — F419 Anxiety disorder, unspecified: Secondary | ICD-10-CM | POA: Diagnosis present

## 2017-11-24 DIAGNOSIS — Z885 Allergy status to narcotic agent status: Secondary | ICD-10-CM

## 2017-11-24 DIAGNOSIS — R0902 Hypoxemia: Secondary | ICD-10-CM | POA: Diagnosis not present

## 2017-11-24 DIAGNOSIS — D72829 Elevated white blood cell count, unspecified: Secondary | ICD-10-CM | POA: Diagnosis present

## 2017-11-24 DIAGNOSIS — R000 Tachycardia, unspecified: Secondary | ICD-10-CM | POA: Diagnosis not present

## 2017-11-24 DIAGNOSIS — R0689 Other abnormalities of breathing: Secondary | ICD-10-CM | POA: Diagnosis not present

## 2017-11-24 DIAGNOSIS — R062 Wheezing: Secondary | ICD-10-CM | POA: Diagnosis not present

## 2017-11-24 DIAGNOSIS — R0602 Shortness of breath: Secondary | ICD-10-CM | POA: Diagnosis not present

## 2017-11-24 DIAGNOSIS — R69 Illness, unspecified: Secondary | ICD-10-CM | POA: Diagnosis not present

## 2017-11-24 LAB — BASIC METABOLIC PANEL
Anion gap: 8 (ref 5–15)
BUN: 7 mg/dL (ref 6–20)
CHLORIDE: 93 mmol/L — AB (ref 98–111)
CO2: 28 mmol/L (ref 22–32)
Calcium: 8.7 mg/dL — ABNORMAL LOW (ref 8.9–10.3)
Creatinine, Ser: 0.5 mg/dL — ABNORMAL LOW (ref 0.61–1.24)
GFR calc Af Amer: >60 mL/min (ref >60)
GLUCOSE: 143 mg/dL — AB (ref 70–99)
POTASSIUM: 3.8 mmol/L (ref 3.5–5.1)
Sodium: 129 mmol/L — ABNORMAL LOW (ref 135–145)

## 2017-11-24 LAB — CBC WITH DIFFERENTIAL/PLATELET
Abs Immature Granulocytes: 0.11 10*3/uL — ABNORMAL HIGH (ref 0.00–0.07)
BASOS ABS: 0.1 10*3/uL (ref 0.0–0.1)
BASOS PCT: 0 %
EOS ABS: 0 10*3/uL (ref 0.0–0.5)
Eosinophils Relative: 0 %
HCT: 40.2 % (ref 39.0–52.0)
HEMOGLOBIN: 12.6 g/dL — AB (ref 13.0–17.0)
Immature Granulocytes: 1 %
LYMPHS PCT: 4 %
Lymphs Abs: 0.7 10*3/uL (ref 0.7–4.0)
MCH: 28.4 pg (ref 26.0–34.0)
MCHC: 31.3 g/dL (ref 30.0–36.0)
MCV: 90.7 fL (ref 80.0–100.0)
MONO ABS: 1.9 10*3/uL — AB (ref 0.1–1.0)
MONOS PCT: 10 %
NEUTROS ABS: 16.5 10*3/uL — AB (ref 1.7–7.7)
Neutrophils Relative %: 85 %
PLATELETS: 430 10*3/uL — AB (ref 150–400)
RBC: 4.43 MIL/uL (ref 4.22–5.81)
RDW: 14.5 % (ref 11.5–15.5)
WBC: 19.3 10*3/uL — ABNORMAL HIGH (ref 4.0–10.5)
nRBC: 0 % (ref 0.0–0.2)

## 2017-11-24 MED ORDER — SODIUM CHLORIDE 0.9% FLUSH: 3.0000 mL | INTRAVENOUS | Status: DC | PRN

## 2017-11-24 MED ORDER — SODIUM CHLORIDE 0.9% FLUSH
3.0000 mL | Freq: Two times a day (BID) | INTRAVENOUS | Status: DC
  Administered 2017-11-24 – 2017-11-26 (×3): 3 mL via INTRAVENOUS

## 2017-11-24 MED ORDER — PREDNISONE 50 MG PO TABS
60.0000 mg | ORAL_TABLET | Freq: Once | ORAL | Status: AC
  Administered 2017-11-24: 60 mg via ORAL
  Filled 2017-11-24: qty 1

## 2017-11-24 MED ORDER — ENOXAPARIN SODIUM 40 MG/0.4ML ~~LOC~~ SOLN
40.0000 mg | SUBCUTANEOUS | Status: DC
  Administered 2017-11-24 – 2017-11-25 (×2): 40 mg via SUBCUTANEOUS
  Filled 2017-11-24 (×2): qty 0.4

## 2017-11-24 MED ORDER — SODIUM CHLORIDE 0.9 % IV SOLN: 250.0000 mL | INTRAVENOUS | Status: DC | PRN

## 2017-11-24 MED ORDER — OXYCODONE-ACETAMINOPHEN 5-325 MG PO TABS
1.0000 | ORAL_TABLET | ORAL | Status: DC | PRN
  Administered 2017-11-25 – 2017-11-26 (×2): 1 via ORAL
  Filled 2017-11-24 (×2): qty 1

## 2017-11-24 MED ORDER — METHYLPREDNISOLONE SODIUM SUCC 125 MG IJ SOLR
60.0000 mg | Freq: Four times a day (QID) | INTRAMUSCULAR | Status: DC
  Administered 2017-11-24 – 2017-11-26 (×7): 60 mg via INTRAVENOUS
  Filled 2017-11-24 (×7): qty 2

## 2017-11-24 MED ORDER — ONDANSETRON HCL 4 MG/2ML IJ SOLN: 4.0000 mg | Freq: Four times a day (QID) | INTRAMUSCULAR | Status: DC | PRN

## 2017-11-24 MED ORDER — ALBUTEROL SULFATE (2.5 MG/3ML) 0.083% IN NEBU
5.0000 mg | INHALATION_SOLUTION | Freq: Once | RESPIRATORY_TRACT | Status: AC
  Administered 2017-11-24: 5 mg via RESPIRATORY_TRACT
  Filled 2017-11-24: qty 6

## 2017-11-24 MED ORDER — GABAPENTIN 300 MG PO CAPS
300.0000 mg | ORAL_CAPSULE | Freq: Three times a day (TID) | ORAL | Status: DC
  Administered 2017-11-24 – 2017-11-26 (×5): 300 mg via ORAL
  Filled 2017-11-24 (×5): qty 1

## 2017-11-24 MED ORDER — OXYCODONE HCL 5 MG PO TABS
5.0000 mg | ORAL_TABLET | ORAL | Status: DC | PRN
  Administered 2017-11-25 – 2017-11-26 (×2): 5 mg via ORAL
  Filled 2017-11-24 (×2): qty 1

## 2017-11-24 MED ORDER — ALBUTEROL (5 MG/ML) CONTINUOUS INHALATION SOLN
10.0000 mg/h | INHALATION_SOLUTION | Freq: Once | RESPIRATORY_TRACT | Status: AC
  Administered 2017-11-24: 10 mg/h via RESPIRATORY_TRACT

## 2017-11-24 MED ORDER — IPRATROPIUM-ALBUTEROL 0.5-2.5 (3) MG/3ML IN SOLN
3.0000 mL | Freq: Four times a day (QID) | RESPIRATORY_TRACT | Status: DC
  Administered 2017-11-24 – 2017-11-25 (×4): 3 mL via RESPIRATORY_TRACT
  Filled 2017-11-24 (×4): qty 3

## 2017-11-24 MED ORDER — ALBUTEROL (5 MG/ML) CONTINUOUS INHALATION SOLN
10.0000 mg/h | INHALATION_SOLUTION | Freq: Once | RESPIRATORY_TRACT | Status: AC
  Administered 2017-11-24: 10 mg/h via RESPIRATORY_TRACT
  Filled 2017-11-24: qty 20

## 2017-11-24 MED ORDER — ALBUTEROL SULFATE (2.5 MG/3ML) 0.083% IN NEBU
2.5000 mg | INHALATION_SOLUTION | Freq: Four times a day (QID) | RESPIRATORY_TRACT | Status: DC
  Filled 2017-11-24: qty 3

## 2017-11-24 MED ORDER — ALPRAZOLAM 1 MG PO TABS
1.0000 mg | ORAL_TABLET | Freq: Three times a day (TID) | ORAL | Status: DC
  Administered 2017-11-24 – 2017-11-26 (×4): 1 mg via ORAL
  Filled 2017-11-24 (×4): qty 1

## 2017-11-24 MED ORDER — ENSURE ENLIVE PO LIQD: 237.0000 mL | Freq: Two times a day (BID) | ORAL | Status: DC

## 2017-11-24 MED ORDER — ONDANSETRON HCL 4 MG PO TABS: 4.0000 mg | ORAL_TABLET | Freq: Four times a day (QID) | ORAL | Status: DC | PRN

## 2017-11-24 MED ORDER — OXYCODONE-ACETAMINOPHEN 10-325 MG PO TABS: 1.0000 | ORAL_TABLET | ORAL | Status: DC | PRN

## 2017-11-24 MED ORDER — IPRATROPIUM BROMIDE 0.02 % IN SOLN: 0.5000 mg | Freq: Four times a day (QID) | RESPIRATORY_TRACT | Status: DC

## 2017-11-24 MED ORDER — GUAIFENESIN ER 600 MG PO TB12
1200.0000 mg | ORAL_TABLET | Freq: Two times a day (BID) | ORAL | Status: DC
  Administered 2017-11-24 – 2017-11-26 (×4): 1200 mg via ORAL
  Filled 2017-11-24 (×4): qty 2
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
## 2017-11-24 NOTE — ED Notes (Signed)
PT ambulated in the hallway approx. 60 feet and on his home oxygen of 2 L and pt became SOB, tachycardia in the 130s and Resp rate increased to 36. PT returned back to bed and EDP made aware of SOB and decreased O2 sats with exertion and new order for continuous neb.

## 2017-11-24 NOTE — ED Triage Notes (Signed)
Short of breath, history of COPD

## 2017-11-24 NOTE — ED Provider Notes (Addendum)
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 supracondylar fracture of the humerus, with 1.1 cm of anterior displacement of the distal fragment medially on image 18/11, and 0.4 cm of anterior displacement of the lateral portion of the distal fracture fragment. As expected there is a elbow joint effusion. Subtle irregularity along the posterior margin of the lateral humeral condyle on image 29/11, probably from streak artifact and less likely to be due to a small fracture. Similarly, faint density adjacent to the coronoid process of the ulna on image 19/11 is thought to probably be artifactual rather than a small avulsion fracture. The radial head appears intact. Ligaments Suboptimally assessed by CT. Muscles and Tendons Grossly unremarkable Soft tissues Unremarkable IMPRESSION: 1. Supracondylar fracture of the distal humerus, with 1.1 cm of anterior displacement of the distal fracture fragment medially, and 0.4 cm of anterior displacement of the distal fracture fragment laterally. Associated elbow joint effusion.  Electronically Signed   By: Van Clines M.D.   On: 11/14/2017 00:43   Dg Chest Port 1 View  Result Date: 11/13/2017 CLINICAL DATA:  Pain following trauma EXAM: PORTABLE CHEST 1 VIEW COMPARISON:  None. FINDINGS: There is no evident edema or consolidation. The heart size is normal. Prominence of main pulmonary arteries raises question of underlying pulmonary arterial hypertension. No adenopathy evident. No pneumothorax. No fracture evident. IMPRESSION: Question a degree of pulmonary arterial hypertension. Lungs clear. No pneumothorax. Electronically Signed   By: Lowella Grip III M.D.   On: 11/13/2017 21:47   Dg C-arm 1-60 Min  Result Date: 11/17/2017 CLINICAL DATA:  Distal right humeral fracture EXAM: DG C-ARM 61-120 MIN; RIGHT ELBOW - COMPLETE 3+ VIEW COMPARISON:  None. FLUOROSCOPY TIME:  Fluoroscopy Time:  80 seconds Radiation Exposure Index (if provided by the fluoroscopic device): 1.38 mGy Number of Acquired Spot Images: 7 FINDINGS: Initial images again demonstrate distal humeral fracture with mild anterior displacement of the distal fracture fragments. The fracture fragments were aligned and a fixation sideplate was placed posteriorly with multiple fixation screws. Additionally a medial fixation sideplate with multiple screws was placed. The fracture fragments are in near anatomic alignment. IMPRESSION: Status post ORIF of distal right humeral fracture Electronically Signed   By: Inez Catalina M.D.   On: 11/17/2017 11:57   Dg C-arm 1-60 Min  Result Date: 11/17/2017 CLINICAL DATA:  ORIF of distal radius EXAM: RIGHT WRIST - COMPLETE 3+ VIEW; DG C-ARM 61-120 MIN COMPARISON:  None. FINDINGS: Five intraprocedural fluoroscopic images show reduction and then lag screw fixation of distal radius fracture. Alignment appears anatomic on final images. IMPRESSION: Fluoroscopy for distal radius ORIF. Electronically Signed   By: Monte Fantasia M.D.   On: 11/17/2017 11:18    Radiology No results  found.  Procedures Procedures (including critical care time)  Medications Ordered in ED Medications  predniSONE (DELTASONE) tablet 60 mg (has no administration in time range)  albuterol (PROVENTIL) (2.5 MG/3ML) 0.083% nebulizer solution 5 mg (5 mg Nebulization Given 11/24/17 1438)    Chest x-ray reviewed by me Initial Impression / Assessment and Plan / ED Course  I have reviewed the triage vital signs and the nursing notes.  Pertinent labs & imaging results that were available during my care of the patient were reviewed by me and considered in my medical decision making (see chart for details).  Clinical Course as of Nov 28 1104  Fri Nov 24, 2017  1739 Reevaluated patient.  He is on a continuous neb and says he feels about the same.   [MB]  7106 She became very tachypneic with minimal exertion and desatted during ambulation trial to the 70s on oxygen.  He is agreeable to stay in the hospital.   [MB]  1924 Discussed with Dr. Darrick Meigs from the hospitalist service will evaluate the patient for admission.   [MB]    Clinical Course User Index [MB] Hayden Rasmussen, MD    4 PM patient states breathing is normal after treatment with albuterol nebulized treatment and oral prednisone.  His oxygen was cut back to 2 L by me 3 L. 2liters/min is his normal home oxygen dose  4:30 PM patient ambulated in the emergency department on oxygen 2 L.  He became acutely dyspneic and pulse oximetry desaturated to 77%, consistent with hypoxia.  Continuous nebulization ordered.lab work pending  Pt signed out to Dr. Melina Copa 435 pm I counseled patient for 5 minutes on smoking cessation Final Clinical Impressions(s) / ED Diagnoses  Diagnoses #1 COPD exacerbation Final diagnoses:  None  #2 hypoxia  #3 tobacco abuse  ED Discharge Orders    None       Orlie Dakin, MD 11/24/17 1640 CRITICAL CARE Performed by: Orlie Dakin Total critical care time: 35 minutes Critical care time was exclusive of  separately billable procedures and treating other patients. Critical care was necessary to treat or prevent imminent or life-threatening deterioration. Critical care was time spent personally by me on the following activities: development of treatment plan with patient and/or surrogate as well as nursing, discussions with consultants, evaluation of patient's response to treatment, examination of patient, obtaining history from patient or surrogate, ordering and performing treatments and interventions, ordering and review of laboratory studies, ordering and review of radiographic studies, pulse oximetry and re-evaluation of patient's condition.   Orlie Dakin, MD 11/28/17 [phone]

## 2017-11-24 NOTE — ED Provider Notes (Signed)
Signout from Dr. Winfred Leeds.  52 year old male with COPD and active smoker here with increased shortness of breath.  He is received some steroids and nebulizer treatments but still remains dyspneic on exertion.  He is getting some labs and continuous nebs.  Plan is reevaluation for decision of admission versus discharge.  Clinical Course as of Nov 25 1308  Fri Nov 24, 2017  1739 Reevaluated patient.  He is on a continuous neb and says he feels about the same.   [MB]  5997 She became very tachypneic with minimal exertion and desatted during ambulation trial to the 70s on oxygen.  He is agreeable to stay in the hospital.   [MB]  1924 Discussed with Dr. Darrick Meigs from the hospitalist service will evaluate the patient for admission.   [MB]    Clinical Course User Index [MB] Hayden Rasmussen, MD      Hayden Rasmussen, MD 11/25/17 1311

## 2017-11-25 LAB — CBC
HCT: 42.1 % (ref 39.0–52.0)
HEMOGLOBIN: 13.1 g/dL (ref 13.0–17.0)
MCH: 28.3 pg (ref 26.0–34.0)
MCHC: 31.1 g/dL (ref 30.0–36.0)
MCV: 90.9 fL (ref 80.0–100.0)
NRBC: 0 % (ref 0.0–0.2)
Platelets: 433 10*3/uL — ABNORMAL HIGH (ref 150–400)
RBC: 4.63 MIL/uL (ref 4.22–5.81)
RDW: 14.6 % (ref 11.5–15.5)
WBC: 10.7 10*3/uL — AB (ref 4.0–10.5)

## 2017-11-25 LAB — COMPREHENSIVE METABOLIC PANEL
ALBUMIN: 3.1 g/dL — AB (ref 3.5–5.0)
ALT: 23 U/L (ref 0–44)
ANION GAP: 10 (ref 5–15)
AST: 28 U/L (ref 15–41)
Alkaline Phosphatase: 117 U/L (ref 38–126)
BUN: 10 mg/dL (ref 6–20)
CO2: 28 mmol/L (ref 22–32)
Calcium: 9.2 mg/dL (ref 8.9–10.3)
Chloride: 97 mmol/L — ABNORMAL LOW (ref 98–111)
Creatinine, Ser: 0.36 mg/dL — ABNORMAL LOW (ref 0.61–1.24)
GFR calc Af Amer: >60 mL/min (ref >60)
GFR calc non Af Amer: >60 mL/min (ref >60)
GLUCOSE: 154 mg/dL — AB (ref 70–99)
POTASSIUM: 4.2 mmol/L (ref 3.5–5.1)
Sodium: 135 mmol/L (ref 135–145)
TOTAL PROTEIN: 7.5 g/dL (ref 6.5–8.1)
Total Bilirubin: 0.5 mg/dL (ref 0.3–1.2)

## 2017-11-25 LAB — MRSA PCR SCREENING: MRSA BY PCR: NEGATIVE

## 2017-11-25 MED ORDER — TIOTROPIUM BROMIDE MONOHYDRATE 18 MCG IN CAPS
18.0000 ug | ORAL_CAPSULE | Freq: Every day | RESPIRATORY_TRACT | Status: DC
  Administered 2017-11-26: 18 ug via RESPIRATORY_TRACT
  Filled 2017-11-25: qty 5

## 2017-11-25 MED ORDER — AZITHROMYCIN 250 MG PO TABS
500.0000 mg | ORAL_TABLET | Freq: Every day | ORAL | Status: DC
  Administered 2017-11-25 – 2017-11-26 (×2): 500 mg via ORAL
  Filled 2017-11-25 (×2): qty 2

## 2017-11-25 MED ORDER — MOMETASONE FURO-FORMOTEROL FUM 200-5 MCG/ACT IN AERO
2.0000 | INHALATION_SPRAY | Freq: Two times a day (BID) | RESPIRATORY_TRACT | Status: DC
  Administered 2017-11-26: 2 via RESPIRATORY_TRACT
  Filled 2017-11-25: qty 8.8

## 2017-11-25 MED ORDER — TRAZODONE HCL 50 MG PO TABS
50.0000 mg | ORAL_TABLET | Freq: Every day | ORAL | Status: DC
  Administered 2017-11-25: 50 mg via ORAL
  Filled 2017-11-25: qty 1

## 2017-11-25 MED ORDER — BENZONATATE 100 MG PO CAPS
100.0000 mg | ORAL_CAPSULE | Freq: Three times a day (TID) | ORAL | Status: DC | PRN
  Administered 2017-11-25 (×2): 100 mg via ORAL
  Filled 2017-11-25 (×2): qty 1
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
## 2017-11-25 NOTE — Progress Notes (Signed)
Codeine and Hydrocodone allergy listed.  Patient reports itching and that he can take Oxycodone.  Reports he takes it at home and it is listed on PTA med list.

## 2017-11-26 MED ORDER — TIOTROPIUM BROMIDE MONOHYDRATE 18 MCG IN CAPS: 18.0000 ug | ORAL_CAPSULE | Freq: Every day | RESPIRATORY_TRACT | 2 refills | Status: DC

## 2017-11-26 MED ORDER — PREDNISONE 10 MG PO TABS: 10.0000 mg | ORAL_TABLET | Freq: Every day | ORAL | 0 refills | Status: DC

## 2017-11-26 MED ORDER — AZITHROMYCIN 250 MG PO TABS: ORAL_TABLET | ORAL | 0 refills | Status: DC

## 2017-11-26 MED ORDER — BENZONATATE 100 MG PO CAPS: 100.0000 mg | ORAL_CAPSULE | Freq: Three times a day (TID) | ORAL | 0 refills | Status: DC | PRN

## 2017-11-26 NOTE — Discharge Summary (Signed)
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 NEGATIVE Final    Comment: (NOTE) The Xpert SA Assay (FDA approved for NASAL specimens in patients 42 years of age and older), is one component of a comprehensive surveillance program. It is not intended to diagnose infection nor to guide or monitor treatment. Performed at [facility], [place] [address], [place], [place] [address]   MRSA PCR Screening     Status: None   Collection Time: 11/24/17  9:23 PM  Result Value Ref Range Status   MRSA by PCR NEGATIVE NEGATIVE Final    Comment:        The GeneXpert MRSA Assay (FDA approved for NASAL specimens only), is one component of a comprehensive MRSA colonization surveillance program. It is not intended to diagnose MRSA infection nor to guide or monitor treatment for MRSA infections. Performed at [facility], [address], [place],  [address]      Labs: Basic Metabolic Panel: Recent Labs  Lab 11/24/17 1711 11/25/17 0643  NA 129* 135  K 3.8 4.2  CL 93* 97*  CO2 28 28  GLUCOSE 143* 154*  BUN 7 10  CREATININE 0.50* 0.36*  CALCIUM 8.7* 9.2   Liver Function Tests: Recent Labs   Lab 11/25/17 0643  AST 28  ALT 23  ALKPHOS 117  BILITOT 0.5  PROT 7.5  ALBUMIN 3.1*   No results for input(s): LIPASE, AMYLASE in the last 168 hours. No results for input(s): AMMONIA in the last 168 hours. CBC: Recent Labs  Lab 11/24/17 1711 11/25/17 0643  WBC 19.3* 10.7*  NEUTROABS 16.5*  --   HGB 12.6* 13.1  HCT 40.2 42.1  MCV 90.7 90.9  PLT 430* 433*   Cardiac Enzymes: No results for input(s): CKTOTAL, CKMB, CKMBINDEX, TROPONINI in the last 168 hours. BNP: BNP (last 3 results) Recent Labs    01/26/17 1914  BNP 622.0*    ProBNP (last 3 results) No results for input(s): PROBNP in the last 8760 hours.  CBG: No results for input(s): GLUCAP in the last 168 hours.     Signed:  Lelon Frohlich  [facility] Pager: [phone] 11/26/2017, 11:35 AM

## 2017-11-26 NOTE — Progress Notes (Signed)
AVS reviewed with patient.  Verbalized understanding of discharge instructions, physician follow-up, medications.  Patient's friend brought portable oxygen tank for transport home.  Patient reports belongings intact and in possession at time of discharge.  Patient transported by NT via w/c to main entrance at time of discharge.  Patient stable at time of discharge.

## 2017-12-05 DIAGNOSIS — S42401D Unspecified fracture of lower end of right humerus, subsequent encounter for fracture with routine healing: Secondary | ICD-10-CM | POA: Diagnosis not present

## 2017-12-05 DIAGNOSIS — S52511D Displaced fracture of right radial styloid process, subsequent encounter for closed fracture with routine healing: Secondary | ICD-10-CM | POA: Diagnosis not present

## 2017-12-09 DIAGNOSIS — J449 Chronic obstructive pulmonary disease, unspecified: Secondary | ICD-10-CM | POA: Diagnosis not present

## 2017-12-14 DIAGNOSIS — S52511A Displaced fracture of right radial styloid process, initial encounter for closed fracture: Secondary | ICD-10-CM

## 2017-12-14 DIAGNOSIS — S52611A Displaced fracture of right ulna styloid process, initial encounter for closed fracture: Secondary | ICD-10-CM

## 2017-12-26 DIAGNOSIS — S42401D Unspecified fracture of lower end of right humerus, subsequent encounter for fracture with routine healing: Secondary | ICD-10-CM | POA: Diagnosis not present

## 2017-12-26 DIAGNOSIS — S52511D Displaced fracture of right radial styloid process, subsequent encounter for closed fracture with routine healing: Secondary | ICD-10-CM | POA: Diagnosis not present

## 2018-01-08 DIAGNOSIS — J449 Chronic obstructive pulmonary disease, unspecified: Secondary | ICD-10-CM | POA: Diagnosis not present

## 2018-02-08 DIAGNOSIS — J449 Chronic obstructive pulmonary disease, unspecified: Secondary | ICD-10-CM | POA: Diagnosis not present

## 2018-02-21 DIAGNOSIS — S52511D Displaced fracture of right radial styloid process, subsequent encounter for closed fracture with routine healing: Secondary | ICD-10-CM | POA: Diagnosis not present

## 2018-02-21 DIAGNOSIS — S42401D Unspecified fracture of lower end of right humerus, subsequent encounter for fracture with routine healing: Secondary | ICD-10-CM | POA: Diagnosis not present

## 2018-03-08 DIAGNOSIS — J449 Chronic obstructive pulmonary disease, unspecified: Secondary | ICD-10-CM | POA: Diagnosis not present

## 2018-03-08 DIAGNOSIS — M545 Low back pain: Secondary | ICD-10-CM | POA: Diagnosis not present

## 2018-03-08 DIAGNOSIS — R69 Illness, unspecified: Secondary | ICD-10-CM | POA: Diagnosis not present

## 2018-03-11 DIAGNOSIS — J449 Chronic obstructive pulmonary disease, unspecified: Secondary | ICD-10-CM | POA: Diagnosis not present

## 2018-04-03 ENCOUNTER — Ambulatory Visit: Payer: Medicare HMO | Admitting: Nurse Practitioner

## 2018-04-09 DIAGNOSIS — J449 Chronic obstructive pulmonary disease, unspecified: Secondary | ICD-10-CM | POA: Diagnosis not present

## 2018-04-12 ENCOUNTER — Encounter: Payer: Self-pay | Admitting: Nurse Practitioner

## 2018-04-12 ENCOUNTER — Other Ambulatory Visit: Payer: Self-pay

## 2018-04-12 ENCOUNTER — Ambulatory Visit: Payer: Medicare HMO | Attending: Nurse Practitioner | Admitting: Nurse Practitioner

## 2018-04-12 VITALS — BP 106/83 | HR 110 | Temp 97.8°F | Ht 68.0 in | Wt 128.0 lb

## 2018-04-12 DIAGNOSIS — M79601 Pain in right arm: Secondary | ICD-10-CM | POA: Diagnosis not present

## 2018-04-12 DIAGNOSIS — G894 Chronic pain syndrome: Secondary | ICD-10-CM | POA: Diagnosis not present

## 2018-04-12 DIAGNOSIS — Z79891 Long term (current) use of opiate analgesic: Secondary | ICD-10-CM

## 2018-04-12 DIAGNOSIS — M25521 Pain in right elbow: Secondary | ICD-10-CM | POA: Diagnosis not present

## 2018-04-12 DIAGNOSIS — Z79899 Other long term (current) drug therapy: Secondary | ICD-10-CM | POA: Diagnosis not present

## 2018-04-12 DIAGNOSIS — G8929 Other chronic pain: Secondary | ICD-10-CM | POA: Diagnosis not present

## 2018-04-12 DIAGNOSIS — M899 Disorder of bone, unspecified: Secondary | ICD-10-CM | POA: Diagnosis not present

## 2018-04-12 DIAGNOSIS — Z7689 Persons encountering health services in other specified circumstances: Secondary | ICD-10-CM | POA: Insufficient documentation

## 2018-04-12 DIAGNOSIS — Z789 Other specified health status: Secondary | ICD-10-CM

## 2018-04-12 DIAGNOSIS — M25531 Pain in right wrist: Secondary | ICD-10-CM | POA: Diagnosis not present

## 2018-04-12 HISTORY — DX: Long term (current) use of opiate analgesic: Z79.891

## 2018-04-12 NOTE — Progress Notes (Signed)
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 acute distress Vitals:   04/12/18 1416  BP: 106/83  Pulse: (!) 110  Temp: 97.8 F (36.6 C)  SpO2: (!) 85%  Weight: 128 lb (58.1 kg)  Height: _0  (1.727 m)   BMI Assessment: Estimated body mass index is 19.46 kg/m as calculated from the following:   Height as of this encounter: _1  (1.727 m).   Weight as of this encounter: 128 lb (58.1 kg).  BMI interpretation table: BMI level Category Range association with higher incidence of chronic pain  <18 kg/m2 Underweight   18.5-24.9 kg/m2 Ideal body weight   25-29.9 kg/m2 Overweight Increased incidence by 20%  30-34.9 kg/m2 Obese (Class I) Increased incidence by 68%  35-39.9 kg/m2 Severe obesity (Class II) Increased incidence by 136%  >40 kg/m2 Extreme obesity (Class III) Increased incidence by 254%   BMI Readings from Last 4 Encounters:  04/12/18 19.46 kg/m  11/25/17 18.67 kg/m  11/13/17 24.33 kg/m  01/28/17 18.20 kg/m   Wt Readings from Last 4 Encounters:  04/12/18 128 lb (58.1 kg)  11/25/17 122 lb 12.7 oz (55.7  kg)  11/13/17 160 lb (72.6 kg)  01/28/17 119 lb 11.4 oz (54.3 kg)  Psych/Mental status: Alert, oriented x 3 (person, place, & time)       Eyes: PERLA Respiratory: No evidence of acute respiratory distress  Cervical Spine Exam  Inspection: No masses, redness, or swelling Alignment: Symmetrical Functional ROM: Unrestricted ROM      Stability: No instability detected Muscle strength & Tone: Functionally intact Sensory: Unimpaired Palpation: No palpable anomalies              Upper Extremity (UE) Exam    Side: Right upper extremity  Side: Left upper extremity  Inspection:  evidence of previous surgery  Inspection: No masses, redness, swelling, or asymmetry. No contractures  Functional ROM: Decreased ROM for elbow and wrist  Functional ROM: Unrestricted ROM          Muscle strength & Tone: Guarding  Muscle strength & Tone: Functionally intact  Sensory: Anesthesia [place]orosa (Numbness over painful area)  Sensory: Unimpaired  Palpation: Complains of area being tender to palpation  Palpation: No palpable anomalies              Specialized Test(s): Deferred         Specialized Test(s): Deferred          Thoracic Spine Exam  Inspection: No masses, redness, or swelling Alignment: Symmetrical Functional ROM: Unrestricted ROM Stability: No instability detected Sensory: Unimpaired Muscle strength & Tone: No palpable anomalies  Lumbar Spine Exam  Inspection: No masses, redness, or swelling Alignment: Symmetrical Functional ROM: Unrestricted ROM      Stability: No instability detected Muscle strength & Tone: Functionally intact Sensory: Unimpaired Palpation: No palpable anomalies       Provocative Tests: Lumbar Hyperextension and rotation test: evaluation deferred today       Patrick's Maneuver: evaluation deferred today                    Gait & Posture Assessment  Ambulation: Unassisted Gait: Relatively normal for age and body habitus Posture: WNL   Lower Extremity Exam     Side: Right lower extremity  Side: Left lower extremity  Inspection: No masses, redness, swelling, or asymmetry. No contractures  Inspection: No masses, redness, swelling, or asymmetry. No contractures  Functional ROM: Unrestricted ROM          Functional ROM: Unrestricted ROM          Muscle strength & Tone: Functionally intact  Muscle strength & Tone: Functionally intact  Sensory: Unimpaired  Sensory: Unimpaired  Palpation: No palpable anomalies  Palpation: No palpable anomalies   Assessment  Primary Diagnosis & Pertinent Problem List: The primary encounter diagnosis was Chronic elbow pain, right (Primary Area of Pain). Diagnoses of Wrist pain, chronic, right (Secondary Area of Pain), Chronic pain of right upper extremity (Tertiary Area of Pain), Chronic pain syndrome, Long term current use of opiate analgesic, Pharmacologic therapy, Disorder of skeletal system, and Problems influencing health status were also pertinent to this visit.  Visit Diagnosis: 1. Chronic elbow pain, right (Primary Area of Pain)   2. Wrist pain, chronic, right (Secondary Area of Pain)   3. Chronic pain of right upper extremity (Tertiary Area of Pain)   4. Chronic pain syndrome   5. Long term current use of opiate analgesic   6. Pharmacologic therapy   7. Disorder of skeletal system   8. Problems influencing health status    Plan of Care  Initial treatment plan:  Please be advised that as per protocol, today's visit has been an evaluation only. We have not taken over the patient's controlled substance management.  Problem-specific plan: No problem-specific Assessment & Plan notes found for this encounter.  Ordered Lab-work, Procedure(s), Referral(s), & Consult(s): Orders Placed This Encounter  Procedures  . Compliance Drug Analysis, Ur  . Comp. Metabolic Panel (12)  . Magnesium  . Vitamin B12  . Sedimentation rate  . 25-Hydroxyvitamin D Lcms D2+D3  . C-reactive protein    Pharmacotherapy: Medications ordered:  No orders of the defined types were placed in this encounter.  Medications administered during this visit: Kyian A. Biever had no medications administered during this visit.   Pharmacotherapy under consideration:  Opioid Analgesics: The patient was informed that there is no guarantee that he would be a candidate for opioid analgesics. The decision will be made following CDC guidelines. This decision will be based on the results of diagnostic studies, as well as Cameron Villarreal risk profile.  Membrane stabilizer: To be determined at a later time Muscle relaxant: To be determined  at a later time NSAID: To be determined at a later time Other analgesic(s): To be determined at a later time   Interventional therapies under consideration: Cameron Villarreal was informed that there is no guarantee that he would be a candidate for interventional therapies. The decision will be based on the results of diagnostic studies, as well as Cameron Villarreal risk profile.  Possible procedure(s): None   Provider-requested follow-up: Return for 2nd Visit, w/ Dr. Dossie Arbour.  Future Appointments  Date Time Provider [place]  05/07/2018 11:30 AM Milinda Pointer, MD [facility] None    Primary Care Physician: Patient, No Pcp Per Location: [facility] Outpatient Pain Management Facility Note by:  Date: 04/12/2018; Time: 3:40 PM  Pain Score Disclaimer: We use the NRS-11 scale. This is a self-reported, subjective measurement of pain severity with only modest accuracy. It is used primarily to identify changes within a particular patient. It must be understood that outpatient pain scales are significantly less accurate that those used for research, where they can be applied under ideal controlled circumstances with minimal exposure to variables. In reality, the score is likely to be a combination of pain intensity and pain affect, where pain affect describes the degree of emotional arousal  or changes in action readiness caused by the sensory experience of pain. Factors such as social and work situation, setting, emotional state, anxiety levels, expectation, and prior pain experience may influence pain perception and show large inter-individual differences that may also be affected by time variables.  Patient instructions provided during this appointment: Patient Instructions   ____________________________________________________________________________________________  Appointment Policy Summary  It is our goal and responsibility to provide the medical community with assistance in the evaluation and management of patients with chronic pain. Unfortunately our resources are limited. Because we do not have an unlimited amount of time, or available appointments, we are required to closely monitor and manage their use. The following rules exist to maximize their use:  Patient's responsibilities: 1. Punctuality:  At what time should I arrive? You should be physically present in our office 30 minutes before your scheduled appointment. Your scheduled appointment is with your assigned healthcare provider. However, it takes 5-10 minutes to be "checked-in", and another 15 minutes for the nurses to do the admission. If you arrive to our office at the time you were given for your appointment, you will end up being at least 20-25 minutes late to your appointment with the provider. 2. Tardiness:  What happens if I arrive only a few minutes after my scheduled appointment time? You will need to reschedule your appointment. The cutoff is your appointment time. This is why it is so important that you arrive at least 30 minutes before that appointment. If you have an appointment scheduled for 10:00 AM and you arrive at 10:01, you will be required to reschedule your appointment.  3. Plan ahead:  Always assume that you will encounter traffic on your way in. Plan for it. If you are dependent on a driver,  make sure they understand these rules and the need to arrive early. 4. Other appointments and responsibilities:  Avoid scheduling any other appointments before or after your pain clinic appointments.  5. Be prepared:  Write down everything that you need to discuss with your healthcare provider and give this information to the admitting nurse. Write down the medications that you will need refilled. Bring your pills and bottles (even the empty ones), to all of your appointments, except for those where a procedure is scheduled. 6.  No children or pets:  Find someone to take care of them. It is not appropriate to bring them in. 7. Scheduling changes:  We request "advanced notification" of any changes or cancellations. 8. Advanced notification:  Defined as a time period of more than 24 hours prior to the originally scheduled appointment. This allows for the appointment to be offered to other patients. 9. Rescheduling:  When a visit is rescheduled, it will require the cancellation of the original appointment. For this reason they both fall within the category of "Cancellations".  10. Cancellations:  They require advanced notification. Any cancellation less than 24 hours before the  appointment will be recorded as a "No Show". 11. No Show:  Defined as an unkept appointment where the patient failed to notify or declare to the practice their intention or inability to keep the appointment.  Corrective process for repeat offenders:  1. Tardiness: Three (3) episodes of rescheduling due to late arrivals will be recorded as one (1) "No Show". 2. Cancellation or reschedule: Three (3) cancellations or rescheduling will be recorded as one (1) "No Show". 3. "No Shows": Three (3) "No Shows" within a 12 month period will result in discharge from the  practice. ____________________________________________________________________________________________   ______________________________________________________________________________________________  Specialty Pain Scale  Introduction:  There are significant differences in how pain is reported. The word pain usually refers to physical pain, but it is also a common synonym of suffering. The medical community uses a scale from 0 (zero) to 10 (ten) to report pain level. Zero (0) is described as "no pain", while ten (10) is described as "the worse pain you can imagine". The problem with this scale is that physical pain is reported along with suffering. Suffering refers to mental pain, or more often yet it refers to any unpleasant feeling, emotion or aversion associated with the perception of harm or threat of harm. It is the psychological component of pain.  Pain Specialists prefer to separate the two components. The pain scale used by this practice is the Verbal Numerical Rating Scale (VNRS-11). This scale is for the physical pain only. DO NOT INCLUDE how your pain psychologically affects you. This scale is for adults 68 years of age and older. It has 11 (eleven) levels. The 1st level is 0/10. This means: "right now, I have no pain". In the context of pain management, it also means: "right now, my physical pain is under control with the current therapy".  General Information:  The scale should reflect your current level of pain. Unless you are specifically asked for the level of your worst pain, or your average pain. If you are asked for one of these two, then it should be understood that it is over the past 24 hours.  Levels 1 (one) through 5 (five) are described below, and can be treated as an outpatient. Ambulatory pain management facilities such as ours are more than adequate to treat these levels. Levels 6 (six) through 10 (ten) are also described below, however, these must be treated as a  hospitalized patient. While levels 6 (six) and 7 (seven) may be evaluated at an urgent care facility, levels 8 (eight) through 10 (ten) constitute medical emergencies and as such, they belong in a hospital's emergency department. When having these levels (as described below), do not come to our office. Our facility is not equipped to manage these levels. Go directly to an urgent care facility or an emergency department to be evaluated.  Definitions:  Activities of Daily Living (ADL):  Activities of daily living (ADL or ADLs) is a term used in healthcare to refer to people's daily self-care activities. Health professionals often use a person's ability or inability to perform ADLs as a measurement of their functional status, particularly in regard to people post injury, with disabilities and the elderly. There are two ADL levels: Basic and Instrumental. Basic Activities of Daily Living (BADL  or BADLs) consist of self-care tasks that include: Bathing and showering; personal hygiene and grooming (including brushing/combing/styling hair); dressing; Toilet hygiene (getting to the toilet, cleaning oneself, and getting back up); eating and self-feeding (not including cooking or chewing and swallowing); functional mobility, often referred to as "transferring", as measured by the ability to walk, get in and out of bed, and get into and out of a chair; the broader definition (moving from one place to another while performing activities) is useful for people with different physical abilities who are still able to get around independently. Basic ADLs include the things many people do when they get up in the morning and get ready to go out of the house: get out of bed, go to the toilet, bathe, dress, groom, and eat. On the average, loss of function typically follows a particular order. Hygiene is the first to go, followed by loss of toilet use and locomotion. The last to go is the ability to eat. When there is only one  remaining area in which the person is independent, there is a 62.9% chance that it is eating and only a 3.5% chance that it is hygiene. Instrumental Activities of Daily Living (IADL or IADLs) are not necessary for fundamental functioning, but they let an individual live independently in a community. IADL consist of tasks that include: cleaning and maintaining the house; home establishment and maintenance; care of others (including selecting and supervising caregivers); care of pets; child rearing; managing money; managing financials (investments, etc.); meal preparation and cleanup; shopping for groceries and necessities; moving within the community; safety procedures and emergency responses; health management and maintenance (taking prescribed medications); and using the telephone or other form of communication.  Instructions:  Most patients tend to report their pain as a combination of two factors, their physical pain and their psychosocial pain. This last one is also known as "suffering" and it is reflection of how physical pain affects you socially and psychologically. From now on, report them separately.  From this point on, when asked to report your pain level, report only your physical pain. Use the following table for reference.  Pain Clinic Pain Levels (0-5/10)  Pain Level Score  Description  No Pain 0   Mild pain 1 Nagging, annoying, but does not interfere with basic activities of daily living (ADL). Patients are able to eat, bathe, get dressed, toileting (being able to get on and off the toilet and perform personal hygiene functions), transfer (move in and out of bed or a chair without assistance), and maintain continence (able to control bladder and bowel functions). Blood pressure and heart rate are unaffected. A normal heart rate for a healthy adult ranges from 60 to 100 bpm (beats per minute).   Mild to moderate pain 2 Noticeable and distracting. Impossible to hide from other people. More  frequent flare-ups. Still possible to adapt and function close to normal. It can be very annoying and may have occasional stronger flare-ups. With discipline, patients may get used to it and adapt.   Moderate pain 3 Interferes significantly with activities of daily living (ADL). It becomes difficult  to feed, bathe, get dressed, get on and off the toilet or to perform personal hygiene functions. Difficult to get in and out of bed or a chair without assistance. Very distracting. With effort, it can be ignored when deeply involved in activities.   Moderately severe pain 4 Impossible to ignore for more than a few minutes. With effort, patients may still be able to manage work or participate in some social activities. Very difficult to concentrate. Signs of autonomic nervous system discharge are evident: dilated pupils (mydriasis); mild sweating (diaphoresis); sleep interference. Heart rate becomes elevated (>115 bpm). Diastolic blood pressure (lower number) rises above 100 mmHg. Patients find relief in laying down and not moving.   Severe pain 5 Intense and extremely unpleasant. Associated with frowning face and frequent crying. Pain overwhelms the senses.  Ability to do any activity or maintain social relationships becomes significantly limited. Conversation becomes difficult. Pacing back and forth is common, as getting into a comfortable position is nearly impossible. Pain wakes you up from deep sleep. Physical signs will be obvious: pupillary dilation; increased sweating; goosebumps; brisk reflexes; cold, clammy hands and feet; nausea, vomiting or dry heaves; loss of appetite; significant sleep disturbance with inability to fall asleep or to remain asleep. When persistent, significant weight loss is observed due to the complete loss of appetite and sleep deprivation.  Blood pressure and heart rate becomes significantly elevated. Caution: If elevated blood pressure triggers a pounding headache associated with  blurred vision, then the patient should immediately seek attention at an urgent or emergency care unit, as these may be signs of an impending stroke.    Emergency Department Pain Levels (6-10/10)  Emergency Room Pain 6 Severely limiting. Requires emergency care and should not be seen or managed at an outpatient pain management facility. Communication becomes difficult and requires great effort. Assistance to reach the emergency department may be required. Facial flushing and profuse sweating along with potentially dangerous increases in heart rate and blood pressure will be evident.   Distressing pain 7 Self-care is very difficult. Assistance is required to transport, or use restroom. Assistance to reach the emergency department will be required. Tasks requiring coordination, such as bathing and getting dressed become very difficult.   Disabling pain 8 Self-care is no longer possible. At this level, pain is disabling. The individual is unable to do even the most "basic" activities such as walking, eating, bathing, dressing, transferring to a bed, or toileting. Fine motor skills are lost. It is difficult to think clearly.   Incapacitating pain 9 Pain becomes incapacitating. Thought processing is no longer possible. Difficult to remember your own name. Control of movement and coordination are lost.   The worst pain imaginable 10 At this level, most patients pass out from pain. When this level is reached, collapse of the autonomic nervous system occurs, leading to a sudden drop in blood pressure and heart rate. This in turn results in a temporary and dramatic drop in blood flow to the brain, leading to a loss of consciousness. Fainting is one of the body's self defense mechanisms. Passing out puts the brain in a calmed state and causes it to shut down for a while, in order to begin the healing process.    Summary: 1.   Refer to this scale when providing [place] with your pain level. 2.   Be accurate and  careful when reporting your pain level. This will help with your care. 3.   Over-reporting your pain level will lead to loss  of credibility. 4.   Even a level of 1/10 means that there is pain and will be treated at our facility. 5.   High, inaccurate reporting will be documented as "Symptom Exaggeration", leading to loss of credibility and suspicions of possible secondary gains such as obtaining more narcotics, or wanting to appear disabled, for fraudulent reasons. 6.   Only pain levels of 5 or below will be seen at our facility. 7.   Pain levels of 6 and above will be sent to the Emergency Department and the appointment cancelled.  ______________________________________________________________________________________________

## 2018-04-12 NOTE — Addendum Note (Signed)
Addended by: Vevelyn Francois on: 04/12/2018 03:54 PM   Modules accepted: Orders

## 2018-04-12 NOTE — Patient Instructions (Signed)

## 2018-04-16 ENCOUNTER — Encounter: Payer: Self-pay | Admitting: Nurse Practitioner

## 2018-04-16 ENCOUNTER — Other Ambulatory Visit: Payer: Self-pay | Admitting: Nurse Practitioner

## 2018-04-16 DIAGNOSIS — R7982 Elevated C-reactive protein (CRP): Secondary | ICD-10-CM

## 2018-04-16 DIAGNOSIS — E559 Vitamin D deficiency, unspecified: Secondary | ICD-10-CM | POA: Insufficient documentation

## 2018-04-16 DIAGNOSIS — R700 Elevated erythrocyte sedimentation rate: Secondary | ICD-10-CM | POA: Insufficient documentation

## 2018-04-16 LAB — COMP. METABOLIC PANEL (12)
AST: 19 IU/L (ref 0–40)
Albumin/Globulin Ratio: 1.6 (ref 1.2–2.2)
Albumin: 4.6 g/dL (ref 3.8–4.9)
Alkaline Phosphatase: 128 IU/L — ABNORMAL HIGH (ref 39–117)
BUN/Creatinine Ratio: 10 (ref 9–20)
BUN: 8 mg/dL (ref 6–24)
Bilirubin Total: <0.2 mg/dL (ref 0.0–1.2)
Calcium: 9.6 mg/dL (ref 8.7–10.2)
Chloride: 93 mmol/L — ABNORMAL LOW (ref 96–106)
Creatinine, Ser: 0.77 mg/dL (ref 0.76–1.27)
GFR calc Af Amer: 120 mL/min/{1.73_m2} (ref >59)
GFR calc non Af Amer: 104 mL/min/{1.73_m2} (ref >59)
Globulin, Total: 2.9 g/dL (ref 1.5–4.5)
Glucose: 93 mg/dL (ref 65–99)
Potassium: 5 mmol/L (ref 3.5–5.2)
Sodium: 134 mmol/L (ref 134–144)
Total Protein: 7.5 g/dL (ref 6.0–8.5)

## 2018-04-16 LAB — SEDIMENTATION RATE: Sed Rate: 59 mm/hr — ABNORMAL HIGH (ref 0–30)

## 2018-04-16 LAB — 25-HYDROXY VITAMIN D LCMS D2+D3
25-Hydroxy, Vitamin D-2: <1 ng/mL
25-Hydroxy, Vitamin D-3: 22 ng/mL
25-Hydroxy, Vitamin D: 22 ng/mL — ABNORMAL LOW

## 2018-04-16 LAB — C-REACTIVE PROTEIN: CRP: 16 mg/L — ABNORMAL HIGH (ref 0–10)

## 2018-04-16 LAB — MAGNESIUM: Magnesium: 2.1 mg/dL (ref 1.6–2.3)

## 2018-04-16 LAB — VITAMIN B12: Vitamin B-12: 837 pg/mL (ref 232–1245)

## 2018-04-20 LAB — ANA W/REFLEX IF POSITIVE
Anti Nuclear Antibody(ANA): POSITIVE — AB
ENA RNP Ab: 0.5 AI (ref 0.0–0.9)
ENA SSA (RO) Ab: <0.2 AI (ref 0.0–0.9)
dsDNA Ab: 20 IU/mL — ABNORMAL HIGH (ref 0–9)

## 2018-04-20 LAB — RHEUMATOID FACTOR: RHEUMATOID FACTOR: 10.2 [IU]/mL (ref 0.0–13.9)

## 2018-04-20 LAB — SPECIMEN STATUS REPORT

## 2018-04-23 LAB — OPIATES,MS,WB/SP RFX
6-ACETYLMORPHINE: NEGATIVE
CODEINE: NEGATIVE ng/mL
Dihydrocodeine: NEGATIVE ng/mL
HYDROCODONE: NEGATIVE ng/mL
Hydromorphone: NEGATIVE ng/mL
MORPHINE: NEGATIVE ng/mL
OPIATE CONFIRMATION: NEGATIVE

## 2018-04-23 LAB — BENZODIAZEPINES,MS,WB/SP RFX

## 2018-04-23 LAB — DRUG SCREEN 10 W/CONF, SERUM
Amphetamines, IA: NEGATIVE ng/mL
Barbiturates, IA: NEGATIVE ug/mL
COCAINE & METABOLITE, IA: NEGATIVE ng/mL
METHADONE, IA: NEGATIVE ng/mL
OXYCODONES, IA: POSITIVE ng/mL — AB
Opiates, IA: NEGATIVE ng/mL
PHENCYCLIDINE, IA: NEGATIVE ng/mL
Propoxyphene, IA: NEGATIVE ng/mL
THC(MARIJUANA) METABOLITE, IA: NEGATIVE ng/mL

## 2018-04-23 LAB — OXYCODONES,MS,WB/SP RFX
Oxycocone: 168.3 ng/mL
Oxycodones Confirmation: POSITIVE
Oxymorphone: 1.5 ng/mL

## 2018-05-07 ENCOUNTER — Ambulatory Visit: Payer: Medicare HMO | Attending: Pain Medicine | Admitting: Pain Medicine

## 2018-05-07 ENCOUNTER — Other Ambulatory Visit: Payer: Self-pay

## 2018-05-07 DIAGNOSIS — M25531 Pain in right wrist: Secondary | ICD-10-CM | POA: Diagnosis not present

## 2018-05-07 DIAGNOSIS — G8929 Other chronic pain: Secondary | ICD-10-CM

## 2018-05-07 DIAGNOSIS — M25521 Pain in right elbow: Secondary | ICD-10-CM

## 2018-05-07 DIAGNOSIS — M79601 Pain in right arm: Secondary | ICD-10-CM

## 2018-05-07 DIAGNOSIS — G894 Chronic pain syndrome: Secondary | ICD-10-CM

## 2018-05-07 DIAGNOSIS — S42491S Other displaced fracture of lower end of right humerus, sequela: Secondary | ICD-10-CM | POA: Diagnosis not present
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
## 2018-05-10 DIAGNOSIS — J449 Chronic obstructive pulmonary disease, unspecified: Secondary | ICD-10-CM | POA: Diagnosis not present

## 2018-06-07 DIAGNOSIS — J9611 Chronic respiratory failure with hypoxia: Secondary | ICD-10-CM | POA: Diagnosis not present

## 2018-06-07 DIAGNOSIS — E46 Unspecified protein-calorie malnutrition: Secondary | ICD-10-CM | POA: Diagnosis not present

## 2018-06-07 DIAGNOSIS — R69 Illness, unspecified: Secondary | ICD-10-CM | POA: Diagnosis not present

## 2018-06-07 DIAGNOSIS — J449 Chronic obstructive pulmonary disease, unspecified: Secondary | ICD-10-CM | POA: Diagnosis not present

## 2018-06-09 DIAGNOSIS — J449 Chronic obstructive pulmonary disease, unspecified: Secondary | ICD-10-CM | POA: Diagnosis not present

## 2018-07-10 DIAGNOSIS — J449 Chronic obstructive pulmonary disease, unspecified: Secondary | ICD-10-CM | POA: Diagnosis not present

## 2018-08-09 DIAGNOSIS — J449 Chronic obstructive pulmonary disease, unspecified: Secondary | ICD-10-CM | POA: Diagnosis not present

## 2018-09-06 DIAGNOSIS — M545 Low back pain: Secondary | ICD-10-CM | POA: Diagnosis not present

## 2018-09-06 DIAGNOSIS — J449 Chronic obstructive pulmonary disease, unspecified: Secondary | ICD-10-CM | POA: Diagnosis not present

## 2018-09-06 DIAGNOSIS — R69 Illness, unspecified: Secondary | ICD-10-CM | POA: Diagnosis not present

## 2018-09-09 DIAGNOSIS — J449 Chronic obstructive pulmonary disease, unspecified: Secondary | ICD-10-CM | POA: Diagnosis not present

## 2018-10-10 DIAGNOSIS — J449 Chronic obstructive pulmonary disease, unspecified: Secondary | ICD-10-CM | POA: Diagnosis not present

## 2018-11-09 DIAGNOSIS — J449 Chronic obstructive pulmonary disease, unspecified: Secondary | ICD-10-CM | POA: Diagnosis not present

## 2018-11-25 DIAGNOSIS — R69 Illness, unspecified: Secondary | ICD-10-CM | POA: Diagnosis not present

## 2018-12-10 DIAGNOSIS — J449 Chronic obstructive pulmonary disease, unspecified: Secondary | ICD-10-CM | POA: Diagnosis not present

## 2018-12-17 ENCOUNTER — Other Ambulatory Visit: Payer: Self-pay | Admitting: Cardiology

## 2018-12-17 DIAGNOSIS — Z20822 Contact with and (suspected) exposure to covid-19: Secondary | ICD-10-CM

## 2018-12-18 LAB — NOVEL CORONAVIRUS, NAA: SARS-CoV-2, NAA: NOT DETECTED

## 2019-01-09 DIAGNOSIS — J9611 Chronic respiratory failure with hypoxia: Secondary | ICD-10-CM | POA: Diagnosis not present

## 2019-01-09 DIAGNOSIS — J449 Chronic obstructive pulmonary disease, unspecified: Secondary | ICD-10-CM | POA: Diagnosis not present

## 2019-01-09 DIAGNOSIS — R69 Illness, unspecified: Secondary | ICD-10-CM | POA: Diagnosis not present

## 2019-01-09 DIAGNOSIS — M545 Low back pain: Secondary | ICD-10-CM | POA: Diagnosis not present

## 2019-01-14 ENCOUNTER — Encounter (HOSPITAL_COMMUNITY): Payer: Self-pay | Admitting: *Deleted

## 2019-01-14 ENCOUNTER — Other Ambulatory Visit (HOSPITAL_COMMUNITY): Payer: Self-pay | Admitting: *Deleted

## 2019-01-14 DIAGNOSIS — Z122 Encounter for screening for malignant neoplasm of respiratory organs: Secondary | ICD-10-CM

## 2019-01-14 DIAGNOSIS — Z87891 Personal history of nicotine dependence: Secondary | ICD-10-CM

## 2019-01-14 NOTE — Progress Notes (Signed)
Received referral for initial lung cancer screening scan from Dr. Sinda Du.  Contacted patient and obtained smoking history (started age 53, current smoker about 1/2 pack per day for the last year, previously 1.5 ppd,  61 pack year) as well as answering questions related to the screening process.  Patient denies signs/symptoms of lung cancer such as unexplained weight loss or hemoptysis.  Patient denies comorbidity that would prevent curative treatment if lung cancer were to be found.  Patient is scheduled for shared decision making visit and CT scan on 06/04/2019 at 1030.

## 2019-02-03 DIAGNOSIS — F17200 Nicotine dependence, unspecified, uncomplicated: Secondary | ICD-10-CM

## 2019-02-03 DIAGNOSIS — G4700 Insomnia, unspecified: Secondary | ICD-10-CM

## 2019-02-03 DIAGNOSIS — M545 Low back pain, unspecified: Secondary | ICD-10-CM | POA: Insufficient documentation

## 2019-02-03 DIAGNOSIS — K4090 Unilateral inguinal hernia, without obstruction or gangrene, not specified as recurrent: Secondary | ICD-10-CM

## 2019-02-03 DIAGNOSIS — F419 Anxiety disorder, unspecified: Secondary | ICD-10-CM | POA: Insufficient documentation

## 2019-02-03 DIAGNOSIS — J449 Chronic obstructive pulmonary disease, unspecified: Secondary | ICD-10-CM | POA: Insufficient documentation

## 2019-02-03 DIAGNOSIS — K0889 Other specified disorders of teeth and supporting structures: Secondary | ICD-10-CM

## 2019-02-03 DIAGNOSIS — E46 Unspecified protein-calorie malnutrition: Secondary | ICD-10-CM

## 2019-02-09 DIAGNOSIS — J449 Chronic obstructive pulmonary disease, unspecified: Secondary | ICD-10-CM | POA: Diagnosis not present

## 2019-02-09 DIAGNOSIS — R69 Illness, unspecified: Secondary | ICD-10-CM | POA: Diagnosis not present

## 2019-03-12 DIAGNOSIS — J449 Chronic obstructive pulmonary disease, unspecified: Secondary | ICD-10-CM | POA: Diagnosis not present

## 2019-04-09 DIAGNOSIS — J449 Chronic obstructive pulmonary disease, unspecified: Secondary | ICD-10-CM | POA: Diagnosis not present

## 2019-05-10 DIAGNOSIS — J449 Chronic obstructive pulmonary disease, unspecified: Secondary | ICD-10-CM | POA: Diagnosis not present

## 2019-06-04 ENCOUNTER — Ambulatory Visit (HOSPITAL_COMMUNITY): Payer: Medicare HMO | Admitting: Nurse Practitioner

## 2019-06-04 ENCOUNTER — Ambulatory Visit (HOSPITAL_COMMUNITY): Admission: RE | Admit: 2019-06-04 | Payer: Medicare HMO | Source: Ambulatory Visit

## 2019-06-09 DIAGNOSIS — J449 Chronic obstructive pulmonary disease, unspecified: Secondary | ICD-10-CM | POA: Diagnosis not present

## 2019-06-11 ENCOUNTER — Other Ambulatory Visit: Payer: Self-pay

## 2019-06-11 ENCOUNTER — Inpatient Hospital Stay (HOSPITAL_COMMUNITY): Payer: Medicare HMO | Attending: Hematology | Admitting: Nurse Practitioner

## 2019-06-11 ENCOUNTER — Ambulatory Visit (HOSPITAL_COMMUNITY)
Admission: RE | Admit: 2019-06-11 | Discharge: 2019-06-11 | Disposition: A | Discharge status: Home / Self Care | Payer: Medicare HMO | Source: Ambulatory Visit | Attending: Hematology | Admitting: Hematology

## 2019-06-11 ENCOUNTER — Encounter (HOSPITAL_COMMUNITY): Payer: Self-pay | Admitting: Nurse Practitioner

## 2019-06-11 DIAGNOSIS — Z87891 Personal history of nicotine dependence: Secondary | ICD-10-CM | POA: Diagnosis not present

## 2019-06-11 DIAGNOSIS — Z122 Encounter for screening for malignant neoplasm of respiratory organs: Secondary | ICD-10-CM

## 2019-06-11 DIAGNOSIS — R69 Illness, unspecified: Secondary | ICD-10-CM | POA: Diagnosis not present

## 2019-06-11 DIAGNOSIS — Z720 Tobacco use: Secondary | ICD-10-CM

## 2019-06-11 NOTE — Assessment & Plan Note (Signed)
1.  Tobacco abuse: -This patient meets the criteria for low-dose CT lung cancer screening. -He is asymptomatic for any signs and symptoms of lung cancer. -The shared decision making visit discussion includes risk and benefits of screening, potential for follow-up, diagnostic testing for abnormal scans, potential for false positive test, over diagnosis and discussion about total radiation exposure. -Patient stated willingness to undergo diagnostics and treatment as needed. -Patient was counseled on smoking cessation to decrease risk of lung cancer, pulmonary disease, heart disease and stroke. -Patient was given a resource card with information on receiving free nicotine replacement therapy and information about free smoking cessation classes. -Patient will present for LD CT scan today and will follow up with PCP.

## 2019-06-11 NOTE — Progress Notes (Signed)
[facility] CONSULT NOTE  Patient Care Team: Patient, No Pcp Per as PCP - General (General Practice) Rourk, Cristopher Estimable, MD (Gastroenterology) Sinda Du, MD (Pulmonary Disease)  CHIEF COMPLAINTS/PURPOSE OF CONSULTATION: Shared decision making visit for lung cancer screening  HISTORY OF PRESENTING ILLNESS:  Cameron Villarreal 54 y.o. male presents today for shared decision making visit for lung cancer screening.  He has a past medical history significant for anxiety, back pain, COPD, dyspnea, GERD, hypercholesterolemia, pneumonia, and pulmonary nodule.  Patient states he started smoking at the age of 52.  He is a current smoker about half a pack per day for the last year.  Previously smoked 1-1/2 packs/day.  He has a 61-pack-year history.  He denies any signs and symptoms of lung cancer.  He denies any alcohol or illicit drug use.  Family history of a brother with lung cancer.  MEDICAL HISTORY:  Past Medical History:  Diagnosis Date  . Anxiety   . Anxiety disorder, unspecified   . Back pain   . Chronic obstructive pulmonary disease, unspecified ([place])   . COPD (chronic obstructive pulmonary disease) (HCC)    Severe centrilobular emphysema, on home o2  . Cough   . Dyspnea   . GERD (gastroesophageal reflux disease)   . Hypercholesterolemia   . Insomnia   . Low back pain   . Nicotine dependence, unspecified, uncomplicated   . Other specified disorders of teeth and supporting structures   . Pneumonia   . Pulmonary nodule 07/04/2011  . S/P colonoscopy March 2012   Normal  . S/P endoscopy March 2012   Reflux esophagitis, no ulcerations  . Shortness of breath   . Unilateral inguinal hernia, without obstruction or gangrene, not specified as recurrent   . Unspecified protein-calorie malnutrition ([place])     SURGICAL HISTORY: Past Surgical History:  Procedure Laterality Date  . APPENDECTOMY    . CHOLECYSTECTOMY    . COLONOSCOPY    . HERNIA REPAIR    . KNEE SURGERY  Right    laceration wired back together  . OPEN REDUCTION INTERNAL FIXATION (ORIF) DISTAL RADIAL FRACTURE Right 11/17/2017   Procedure: OPEN REDUCTION INTERNAL FIXATION (ORIF) DISTAL RADIAL FRACTURE;  Surgeon: Shona Needles, MD;  Location: [facility];  Service: Orthopedics;  Laterality: Right;  . ORIF HUMERUS FRACTURE Right 11/17/2017   Procedure: OPEN REDUCTION INTERNAL FIXATION (ORIF) DISTAL HUMERUS FRACTURE;  Surgeon: Shona Needles, MD;  Location: [facility];  Service: Orthopedics;  Laterality: Right;  . Right inguinal hernia repair      SOCIAL HISTORY: Social History   Socioeconomic History  . Marital status: Single    Spouse name: Not on file  . Number of children: Not on file  . Years of education: Not on file  . Highest education level: Not on file  Occupational History  . Not on file  Tobacco Use  . Smoking status: Current Every Day Smoker    Packs/day: 0.50    Years: 30.00    Pack years: 15.00    Types: Cigarettes  . Smokeless tobacco: Never Used  Substance and Sexual Activity  . Alcohol use: No    Alcohol/week: 1.0 standard drinks    Types: 1 Standard drinks or equivalent per week    Comment: Occasional  . Drug use: No  . Sexual activity: Never  Other Topics Concern  . Not on file  Social History Narrative   ** Merged History Encounter **       Social Determinants of  Health   Financial Resource Strain:   . Difficulty of Paying Living Expenses:   Food Insecurity:   . Worried About Charity fundraiser in the Last Year:   . Arboriculturist in the Last Year:   Transportation Needs:   . Film/video editor (Medical):   Marland Kitchen Lack of Transportation (Non-Medical):   Physical Activity:   . Days of Exercise per Week:   . Minutes of Exercise per Session:   Stress:   . Feeling of Stress :   Social Connections:   . Frequency of Communication with Friends and Family:   . Frequency of Social Gatherings with Friends and Family:   . Attends Religious Services:   . Active  Member of Clubs or Organizations:   . Attends Archivist Meetings:   Marland Kitchen Marital Status:   Intimate Partner Violence:   . Fear of Current or Ex-Partner:   . Emotionally Abused:   Marland Kitchen Physically Abused:   . Sexually Abused:     FAMILY HISTORY: Family History  Problem Relation Age of Onset  . Cirrhosis Father        Deceased, ETOH cirrhosis  . Alcoholism Father   . Depression Brother   . Stomach cancer Other        Aunt    ALLERGIES:  is allergic to codeine and hydrocodone.  MEDICATIONS:  Current Outpatient Medications  Medication Sig Dispense Refill  . acetaminophen (TYLENOL) 500 MG tablet Take 500-1,000 mg by mouth every 6 (six) hours as needed (for pain or headaches).    Marland Kitchen albuterol (PROVENTIL) (2.5 MG/3ML) 0.083% nebulizer solution Take 2.5 mg by nebulization every 4 (four) hours as needed for wheezing or shortness of breath.     Marland Kitchen albuterol (VENTOLIN HFA) 108 (90 Base) MCG/ACT inhaler Inhale 2 puffs into the lungs 4 (four) times daily as needed.    . cyclobenzaprine (FLEXERIL) 10 MG tablet Take 10 mg by mouth 2 (two) times daily.    . Fluticasone-Salmeterol (ADVAIR DISKUS) 250-50 MCG/DOSE AEPB Inhale 1 puff into the lungs 2 (two) times daily. 60 each 12  . gabapentin (NEURONTIN) 300 MG capsule Take 300 mg by mouth 3 (three) times daily.     . INCRUSE ELLIPTA 62.5 MCG/INH AEPB Inhale 1 puff into the lungs daily.    Marland Kitchen tiotropium (SPIRIVA) 18 MCG inhalation capsule Place 1 capsule (18 mcg total) into inhaler and inhale daily. 30 capsule 2  . traZODone (DESYREL) 50 MG tablet Take 50 mg by mouth at bedtime.     No current facility-administered medications for this visit.    LABORATORY DATA:  I have reviewed the data as listed Lab Results  Component Value Date   WBC 10.7 (H) 11/25/2017   HGB 13.1 11/25/2017   HCT 42.1 11/25/2017   MCV 90.9 11/25/2017   PLT 433 (H) 11/25/2017     Chemistry      Component Value Date/Time   NA 134 04/12/2018 1535   K 5.0  04/12/2018 1535   CL 93 (L) 04/12/2018 1535   CO2 28 11/25/2017 0643   BUN 8 04/12/2018 1535   CREATININE 0.77 04/12/2018 1535      Component Value Date/Time   CALCIUM 9.6 04/12/2018 1535   ALKPHOS 128 (H) 04/12/2018 1535   AST 19 04/12/2018 1535   ALT 23 11/25/2017 0643   BILITOT <0.2 04/12/2018 1535      ASSESSMENT & PLAN:  Tobacco abuse 1.  Tobacco abuse: -This patient meets the criteria for  low-dose CT lung cancer screening. -He is asymptomatic for any signs and symptoms of lung cancer. -The shared decision making visit discussion includes risk and benefits of screening, potential for follow-up, diagnostic testing for abnormal scans, potential for false positive test, over diagnosis and discussion about total radiation exposure. -Patient stated willingness to undergo diagnostics and treatment as needed. -Patient was counseled on smoking cessation to decrease risk of lung cancer, pulmonary disease, heart disease and stroke. -Patient was given a resource card with information on receiving free nicotine replacement therapy and information about free smoking cessation classes. -Patient will present for LD CT scan today and will follow up with PCP.  All questions were answered. The patient knows to call the clinic with any problems, questions or concerns. I spent 10 mins counseling the patient face to face. The total time spent in the appointment was 20 mins and more than 50% was on counseling.     Glennie Isle, NP-C 06/11/2019 9:48 AM

## 2019-06-11 NOTE — Patient Instructions (Addendum)
You were seen today for your shared decision making visit and a low-dose CT scan for lung cancer screening.    

## 2019-06-12 ENCOUNTER — Encounter (HOSPITAL_COMMUNITY): Payer: Self-pay | Admitting: *Deleted

## 2019-06-12 NOTE — Progress Notes (Signed)
Patient notified via telephone of LDCT lung cancer screening results with recommendations to follow up in 6 months.  Also notified of incidental findings and need to follow up with PCP. I will mail him a copy of these results.    Patient advised that he no longer has a PCP because his has retired.  I have educated him on importance of getting a primary care physician soon so that they can follow him for his medical conditions.  He states that the will get one soon. He also states that he is getting in with the pulmonology office here in town as well.

## 2019-06-20 ENCOUNTER — Institutional Professional Consult (permissible substitution): Payer: Medicare HMO | Admitting: Pulmonary Disease

## 2019-07-10 DIAGNOSIS — J449 Chronic obstructive pulmonary disease, unspecified: Secondary | ICD-10-CM | POA: Diagnosis not present

## 2019-07-19 ENCOUNTER — Institutional Professional Consult (permissible substitution): Payer: Medicare HMO | Admitting: Pulmonary Disease

## 2019-07-24 ENCOUNTER — Institutional Professional Consult (permissible substitution): Payer: Medicare HMO | Admitting: Pulmonary Disease

## 2019-08-09 DIAGNOSIS — J449 Chronic obstructive pulmonary disease, unspecified: Secondary | ICD-10-CM | POA: Diagnosis not present

## 2019-09-09 DIAGNOSIS — J449 Chronic obstructive pulmonary disease, unspecified: Secondary | ICD-10-CM | POA: Diagnosis not present

## 2019-09-18 ENCOUNTER — Other Ambulatory Visit: Payer: Self-pay

## 2019-09-18 ENCOUNTER — Emergency Department (HOSPITAL_COMMUNITY): Payer: Medicare HMO

## 2019-09-18 ENCOUNTER — Emergency Department (HOSPITAL_COMMUNITY)
Admission: EM | Admit: 2019-09-18 | Discharge: 2019-09-18 | Disposition: A | Discharge status: Home / Self Care | Payer: Medicare HMO | Attending: Emergency Medicine | Admitting: Emergency Medicine

## 2019-09-18 ENCOUNTER — Encounter (HOSPITAL_COMMUNITY): Payer: Self-pay | Admitting: Emergency Medicine

## 2019-09-18 DIAGNOSIS — F17210 Nicotine dependence, cigarettes, uncomplicated: Secondary | ICD-10-CM | POA: Diagnosis not present

## 2019-09-18 DIAGNOSIS — R69 Illness, unspecified: Secondary | ICD-10-CM | POA: Diagnosis not present

## 2019-09-18 DIAGNOSIS — R0602 Shortness of breath: Secondary | ICD-10-CM | POA: Diagnosis not present

## 2019-09-18 DIAGNOSIS — R000 Tachycardia, unspecified: Secondary | ICD-10-CM | POA: Diagnosis not present

## 2019-09-18 DIAGNOSIS — J441 Chronic obstructive pulmonary disease with (acute) exacerbation: Secondary | ICD-10-CM

## 2019-09-18 DIAGNOSIS — Z20822 Contact with and (suspected) exposure to covid-19: Secondary | ICD-10-CM | POA: Diagnosis not present

## 2019-09-18 DIAGNOSIS — Z79899 Other long term (current) drug therapy: Secondary | ICD-10-CM | POA: Insufficient documentation

## 2019-09-18 DIAGNOSIS — J449 Chronic obstructive pulmonary disease, unspecified: Secondary | ICD-10-CM | POA: Insufficient documentation

## 2019-09-18 LAB — BLOOD GAS, VENOUS
Acid-Base Excess: 7.2 mmol/L — ABNORMAL HIGH (ref 0.0–2.0)
Bicarbonate: 26.7 mmol/L (ref 20.0–28.0)
Drawn by: 1528
FIO2: 89
O2 Saturation: 31.2 %
Patient temperature: 37
pCO2, Ven: 65.5 mmHg — ABNORMAL HIGH (ref 44.0–60.0)
pH, Ven: 7.324 (ref 7.250–7.430)
pO2, Ven: <31 mmHg — CL (ref 32.0–45.0)

## 2019-09-18 LAB — CBC WITH DIFFERENTIAL/PLATELET
Abs Immature Granulocytes: 0.09 10*3/uL — ABNORMAL HIGH (ref 0.00–0.07)
Basophils Absolute: 0.1 10*3/uL (ref 0.0–0.1)
Basophils Relative: 1 %
Eosinophils Absolute: 0.3 10*3/uL (ref 0.0–0.5)
Eosinophils Relative: 2 %
HCT: 55.3 % — ABNORMAL HIGH (ref 39.0–52.0)
Hemoglobin: 17.1 g/dL — ABNORMAL HIGH (ref 13.0–17.0)
Immature Granulocytes: 1 %
Lymphocytes Relative: 10 %
Lymphs Abs: 1.6 10*3/uL (ref 0.7–4.0)
MCH: 28.2 pg (ref 26.0–34.0)
MCHC: 30.9 g/dL (ref 30.0–36.0)
MCV: 91.3 fL (ref 80.0–100.0)
Monocytes Absolute: 1.8 10*3/uL — ABNORMAL HIGH (ref 0.1–1.0)
Monocytes Relative: 11 %
Neutro Abs: 12 10*3/uL — ABNORMAL HIGH (ref 1.7–7.7)
Neutrophils Relative %: 75 %
Platelets: 300 10*3/uL (ref 150–400)
RBC: 6.06 MIL/uL — ABNORMAL HIGH (ref 4.22–5.81)
RDW: 15.3 % (ref 11.5–15.5)
WBC: 15.9 10*3/uL — ABNORMAL HIGH (ref 4.0–10.5)
nRBC: 0 % (ref 0.0–0.2)

## 2019-09-18 LAB — BASIC METABOLIC PANEL
Anion gap: 8 (ref 5–15)
BUN: 6 mg/dL (ref 6–20)
CO2: 28 mmol/L (ref 22–32)
Calcium: 9.3 mg/dL (ref 8.9–10.3)
Chloride: 98 mmol/L (ref 98–111)
Creatinine, Ser: 0.71 mg/dL (ref 0.61–1.24)
GFR calc Af Amer: >60 mL/min (ref >60)
GFR calc non Af Amer: >60 mL/min (ref >60)
Glucose, Bld: 104 mg/dL — ABNORMAL HIGH (ref 70–99)
Potassium: 3.9 mmol/L (ref 3.5–5.1)
Sodium: 134 mmol/L — ABNORMAL LOW (ref 135–145)

## 2019-09-18 LAB — SARS CORONAVIRUS 2 BY RT PCR (HOSPITAL ORDER, PERFORMED IN ~~LOC~~ HOSPITAL LAB): SARS Coronavirus 2: NEGATIVE

## 2019-09-18 MED ORDER — ALBUTEROL (5 MG/ML) CONTINUOUS INHALATION SOLN
10.0000 mg/h | INHALATION_SOLUTION | Freq: Once | RESPIRATORY_TRACT | Status: AC
  Administered 2019-09-18: 10 mg/h via RESPIRATORY_TRACT
  Filled 2019-09-18: qty 20

## 2019-09-18 MED ORDER — METHYLPREDNISOLONE SODIUM SUCC 125 MG IJ SOLR
125.0000 mg | Freq: Once | INTRAMUSCULAR | Status: AC
  Administered 2019-09-18: 125 mg via INTRAVENOUS
  Filled 2019-09-18: qty 2

## 2019-09-18 MED ORDER — PREDNISONE 20 MG PO TABS
60.0000 mg | ORAL_TABLET | Freq: Every day | ORAL | 0 refills | Status: DC
Start: 2019-09-18 — End: 2019-09-23

## 2019-09-18 MED ORDER — DOXYCYCLINE HYCLATE 100 MG PO CAPS
100.0000 mg | ORAL_CAPSULE | Freq: Two times a day (BID) | ORAL | 0 refills | Status: DC
Start: 2019-09-18 — End: 2020-01-17

## 2019-09-18 NOTE — ED Provider Notes (Signed)
[facility] Provider Note   CSN: [id number] Arrival date & time: 09/18/19  0253     History Chief Complaint  Patient presents with  . Shortness of Breath    Cameron Villarreal is a 54 y.o. male.  Patient presents to the emergency department with shortness of breath.  Patient has a history of COPD, chronic O2 dependence.  He is normally on 2 L by nasal cannula.  Since yesterday he has had increased shortness of breath and cough.  He has not had improvement with his inhalers.  He denies fever.        Past Medical History:  Diagnosis Date  . Anxiety   . Anxiety disorder, unspecified   . Back pain   . Chronic obstructive pulmonary disease, unspecified ([place])   . COPD (chronic obstructive pulmonary disease) (HCC)    Severe centrilobular emphysema, on home o2  . Cough   . Dyspnea   . GERD (gastroesophageal reflux disease)   . Hypercholesterolemia   . Insomnia   . Low back pain   . Nicotine dependence, unspecified, uncomplicated   . Other specified disorders of teeth and supporting structures   . Pneumonia   . Pulmonary nodule 07/04/2011  . S/P colonoscopy March 2012   Normal  . S/P endoscopy March 2012   Reflux esophagitis, no ulcerations  . Shortness of breath   . Unilateral inguinal hernia, without obstruction or gangrene, not specified as recurrent   . Unspecified protein-calorie malnutrition [facility])     Patient Active Problem List   Diagnosis Date Noted  . Chronic obstructive pulmonary disease, unspecified ([place])   . Low back pain   . Nicotine dependence, unspecified, uncomplicated   . Anxiety disorder, unspecified   . Insomnia   . Other specified disorders of teeth and supporting structures   . Unilateral inguinal hernia, without obstruction or gangrene, not specified as recurrent   . Unspecified protein-calorie malnutrition ([place])   . Vitamin D insufficiency 04/16/2018  . Elevated sed rate 04/16/2018  . Elevated C-reactive protein (CRP)  04/16/2018  . Chronic elbow pain, right (Primary Area of Pain) 04/12/2018  . Wrist pain, chronic, right (Secondary Area of Pain) 04/12/2018  . Chronic pain syndrome 04/12/2018  . Chronic pain of right upper extremity (Tertiary Area of Pain) 04/12/2018  . Long term current use of opiate analgesic 04/12/2018  . Pharmacologic therapy 04/12/2018  . Disorder of skeletal system 04/12/2018  . Problems influencing health status 04/12/2018  . Disp fx of right radial styloid process, init for clos fx 12/14/2017  . Fracture of right ulnar styloid 12/14/2017  . Closed fracture of right distal humerus 11/16/2017  . Drug abuse ([place]) 01/30/2017  . Acute respiratory failure with hypercapnia ([place]) 01/26/2017  . GERD (gastroesophageal reflux disease) 01/26/2017  . Hyponatremia 01/26/2017  . Hypercholesterolemia 01/26/2017  . Elevated brain natriuretic peptide (BNP) level 01/26/2017  . Elevated troponin 01/26/2017  . Anxiety 01/26/2017  . Thrombocytosis ([place]) 01/26/2017  . Acute on chronic respiratory failure ([place]) 02/04/2015  . Leukocytosis 02/04/2015  . COPD with acute exacerbation ([place]) 12/25/2014  . Malnutrition of moderate degree ([place]) 07/03/2014  . Dehydration 07/02/2014  . Polycythemia 07/01/2014  . Anxiety state 06/24/2013  . Chronic back pain greater than 3 months duration 06/24/2013  . Community acquired pneumonia 06/22/2013  . Respiratory failure, acute ([place]) 06/22/2013  . Chest pain at rest 07/26/2011  . Pulmonary nodule 07/04/2011  . Hyperglycemia, drug-induced 07/04/2011  . Acute respiratory failure ([place]) 07/02/2011  .  CAP (community acquired pneumonia) 07/02/2011  . COPD exacerbation ([place]) 07/02/2011  . Tobacco abuse 07/02/2011  . LUQ abdominal pain 06/17/2010  . RECTAL BLEEDING 04/21/2010  . ABDOMINAL PAIN-EPIGASTRIC 04/21/2010    Past Surgical History:  Procedure Laterality Date  . APPENDECTOMY    . CHOLECYSTECTOMY    . COLONOSCOPY    . HERNIA REPAIR    . KNEE SURGERY  Right    laceration wired back together  . OPEN REDUCTION INTERNAL FIXATION (ORIF) DISTAL RADIAL FRACTURE Right 11/17/2017   Procedure: OPEN REDUCTION INTERNAL FIXATION (ORIF) DISTAL RADIAL FRACTURE;  Surgeon: Shona Needles, MD;  Location: [facility];  Service: Orthopedics;  Laterality: Right;  . ORIF HUMERUS FRACTURE Right 11/17/2017   Procedure: OPEN REDUCTION INTERNAL FIXATION (ORIF) DISTAL HUMERUS FRACTURE;  Surgeon: Shona Needles, MD;  Location: [facility];  Service: Orthopedics;  Laterality: Right;  . Right inguinal hernia repair         Family History  Problem Relation Age of Onset  . Cirrhosis Father        Deceased, ETOH cirrhosis  . Alcoholism Father   . Depression Brother   . Stomach cancer Other        Aunt    Social History   Tobacco Use  . Smoking status: Current Every Day Smoker    Packs/day: 0.50    Years: 30.00    Pack years: 15.00    Types: Cigarettes  . Smokeless tobacco: Never Used  Substance Use Topics  . Alcohol use: No    Alcohol/week: 1.0 standard drink    Types: 1 Standard drinks or equivalent per week    Comment: Occasional  . Drug use: No    Home Medications Prior to Admission medications   Medication Sig Start Date End Date Taking? Authorizing Provider  acetaminophen (TYLENOL) 500 MG tablet Take 500-1,000 mg by mouth every 6 (six) hours as needed (for pain or headaches).    [provider]  albuterol (PROVENTIL) (2.5 MG/3ML) 0.083% nebulizer solution Take 2.5 mg by nebulization every 4 (four) hours as needed for wheezing or shortness of breath.  01/18/17   [provider]  albuterol (VENTOLIN HFA) 108 (90 Base) MCG/ACT inhaler Inhale 2 puffs into the lungs 4 (four) times daily as needed. 10/12/18   [provider]  cyclobenzaprine (FLEXERIL) 10 MG tablet Take 10 mg by mouth 2 (two) times daily. 11/09/17   [provider]  doxycycline (VIBRAMYCIN) 100 MG capsule Take 1 capsule (100 mg total) by mouth 2 (two) times  daily. 09/18/19   Orpah Greek, MD  Fluticasone-Salmeterol (ADVAIR DISKUS) 250-50 MCG/DOSE AEPB Inhale 1 puff into the lungs 2 (two) times daily. 12/27/14   Sinda Du, MD  gabapentin (NEURONTIN) 300 MG capsule Take 300 mg by mouth 3 (three) times daily.  11/02/17   [provider]  INCRUSE ELLIPTA 62.5 MCG/INH AEPB Inhale 1 puff into the lungs daily. 10/12/18   [provider]  predniSONE (DELTASONE) 20 MG tablet Take 3 tablets (60 mg total) by mouth daily with breakfast. 09/18/19   Orpah Greek, MD  traZODone (DESYREL) 50 MG tablet Take 50 mg by mouth at bedtime. 12/10/15   [provider]    Allergies    Codeine and Hydrocodone  Review of Systems   Review of Systems  Respiratory: Positive for cough and shortness of breath.   All other systems reviewed and are negative.   Physical Exam Updated Vital Signs BP (!) 130/96   Pulse Marland Kitchen)  116   Temp 98.4 F (36.9 C) (Oral)   Resp 18   SpO2 93%   Physical Exam Vitals and nursing note reviewed.  Constitutional:      General: He is not in acute distress.    Appearance: Normal appearance.  HENT:     Head: Normocephalic and atraumatic.     Right Ear: Hearing normal.     Left Ear: Hearing normal.     Nose: Nose normal.  Eyes:     Conjunctiva/sclera: Conjunctivae normal.     Pupils: Pupils are equal, round, and reactive to light.  Cardiovascular:     Rate and Rhythm: Regular rhythm.     Heart sounds: S1 normal and S2 normal. No murmur heard.  No friction rub. No gallop.   Pulmonary:     Effort: Tachypnea, accessory muscle usage, prolonged expiration and respiratory distress present.     Breath sounds: Decreased breath sounds present.  Chest:     Chest wall: No tenderness.  Abdominal:     General: Bowel sounds are normal.     Palpations: Abdomen is soft.     Tenderness: There is no abdominal tenderness. There is no guarding or rebound. Negative signs include Murphy's sign and  McBurney's sign.     Hernia: No hernia is present.  Musculoskeletal:        General: Normal range of motion.     Cervical back: Normal range of motion and neck supple.  Skin:    General: Skin is warm and dry.     Findings: No rash.  Neurological:     Mental Status: He is alert and oriented to person, place, and time.     GCS: GCS eye subscore is 4. GCS verbal subscore is 5. GCS motor subscore is 6.     Cranial Nerves: No cranial nerve deficit.     Sensory: No sensory deficit.     Coordination: Coordination normal.  Psychiatric:        Speech: Speech normal.        Behavior: Behavior normal.        Thought Content: Thought content normal.     ED Results / Procedures / Treatments   Labs (all labs ordered are listed, but only abnormal results are displayed) Labs Reviewed  CBC WITH DIFFERENTIAL/PLATELET - Abnormal; Notable for the following components:      Result Value   WBC 15.9 (*)    RBC 6.06 (*)    Hemoglobin 17.1 (*)    HCT 55.3 (*)    Neutro Abs 12.0 (*)    Monocytes Absolute 1.8 (*)    Abs Immature Granulocytes 0.09 (*)    All other components within normal limits  BASIC METABOLIC PANEL - Abnormal; Notable for the following components:   Sodium 134 (*)    Glucose, Bld 104 (*)    All other components within normal limits  BLOOD GAS, VENOUS - Abnormal; Notable for the following components:   pCO2, Ven 65.5 (*)    pO2, Ven <31.0 (*)    Acid-Base Excess 7.2 (*)    All other components within normal limits  SARS CORONAVIRUS 2 BY RT PCR [facility] ORDER, [place] LAB)    EKG EKG Interpretation  Date/Time:  Wednesday September 18 2019 03:15:43 EDT Ventricular Rate:  115 PR Interval:    QRS Duration: 92 QT Interval:  329 QTC Calculation: 455 R Axis:   99 Text Interpretation: Sinus tachycardia Borderline right axis deviation Borderline T wave  abnormalities Confirmed by Orpah Greek [phone]) on 09/18/2019 5:11:10 AM   Radiology DG  Chest Port 1 View  Result Date: 09/18/2019 CLINICAL DATA:  54 year old male with shortness of breath. History of smoking, COPD. COVID-19. Status pending. EXAM: PORTABLE CHEST 1 VIEW COMPARISON:  Chest CT 06/11/2019 and earlier. FINDINGS: Portable AP upright view at 0326 hours. Chronic large lung volumes and widespread coarse interstitial opacity, not significantly changed since 2019 radiographs. Mediastinal contours are stable and within normal limits. Visualized tracheal air column is within normal limits. No pneumothorax, pleural effusion, or acute pulmonary opacity. No acute osseous abnormality identified. Negative visible bowel gas pattern. IMPRESSION: Stable chronic lung disease. No superimposed acute findings are identified. Electronically Signed   By: Genevie Ann M.D.   On: 09/18/2019 03:37    Procedures Procedures (including critical care time)  Medications Ordered in ED Medications  methylPREDNISolone sodium succinate (SOLU-MEDROL) 125 mg/2 mL injection 125 mg (125 mg Intravenous Given 09/18/19 0320)  albuterol (PROVENTIL,VENTOLIN) solution continuous neb (10 mg/hr Nebulization Given 09/18/19 0517)    ED Course  I have reviewed the triage vital signs and the nursing notes.  Pertinent labs & imaging results that were available during my care of the patient were reviewed by me and considered in my medical decision making (see chart for details).    MDM Rules/Calculators/A&P                          Patient was in moderate respiratory distress at arrival.  He was mildly hypoxic with an oxygen saturation of 90%.  Patient is Covid negative.  Chest x-ray does not show a pneumonia.  He was treated with continuous nebulizer treatment, Solu-Medrol.  Patient has significantly improved.  Based on his initial presentation, however, I did offer hospitalization.  Patient declines, does not wish to be hospitalized at this time.  He will therefore be discharged with prednisone, doxycycline, continued  bronchodilator therapy.  He was counseled to return immediately if his symptoms worsen.  Final Clinical Impression(s) / ED Diagnoses Final diagnoses:  COPD exacerbation ([place])    Rx / DC Orders ED Discharge Orders         Ordered    doxycycline (VIBRAMYCIN) 100 MG capsule  2 times daily     Discontinue     09/18/19 0614    predniSONE (DELTASONE) 20 MG tablet  Daily with breakfast     Discontinue     09/18/19 0614           Orpah Greek, MD 09/18/19 [phone]

## 2019-09-18 NOTE — ED Notes (Signed)
Date and time results received: 09/18/19 0401 (use smartphrase ".now" to insert current time)  Test: pO2 Critical Value: <31.0  Name of Provider Notified: [place]  Orders Received? Or Actions Taken?:

## 2019-09-18 NOTE — ED Triage Notes (Signed)
Pt C/O SOB that started yesterday. Pt denies fever.

## 2019-09-18 NOTE — ED Notes (Signed)
Initial o2 was 89% in ra.   Placed on 3L o2, currently 95%.

## 2019-09-23 ENCOUNTER — Other Ambulatory Visit: Payer: Self-pay

## 2019-09-23 ENCOUNTER — Encounter: Payer: Self-pay | Admitting: Pulmonary Disease

## 2019-09-23 ENCOUNTER — Ambulatory Visit (INDEPENDENT_AMBULATORY_CARE_PROVIDER_SITE_OTHER): Payer: Medicare HMO | Admitting: Pulmonary Disease

## 2019-09-23 VITALS — BP 130/84 | HR 110 | Temp 97.3°F | Ht 68.0 in | Wt 122.4 lb

## 2019-09-23 DIAGNOSIS — Z0000 Encounter for general adult medical examination without abnormal findings: Secondary | ICD-10-CM

## 2019-09-23 DIAGNOSIS — J471 Bronchiectasis with (acute) exacerbation: Secondary | ICD-10-CM | POA: Diagnosis not present

## 2019-09-23 DIAGNOSIS — R911 Solitary pulmonary nodule: Secondary | ICD-10-CM | POA: Diagnosis not present

## 2019-09-23 DIAGNOSIS — J441 Chronic obstructive pulmonary disease with (acute) exacerbation: Secondary | ICD-10-CM

## 2019-09-23 DIAGNOSIS — J9612 Chronic respiratory failure with hypercapnia: Secondary | ICD-10-CM | POA: Diagnosis not present

## 2019-09-23 DIAGNOSIS — R64 Cachexia: Secondary | ICD-10-CM

## 2019-09-23 DIAGNOSIS — R69 Illness, unspecified: Secondary | ICD-10-CM | POA: Diagnosis not present

## 2019-09-23 DIAGNOSIS — J9611 Chronic respiratory failure with hypoxia: Secondary | ICD-10-CM | POA: Diagnosis not present

## 2019-09-23 DIAGNOSIS — J449 Chronic obstructive pulmonary disease, unspecified: Secondary | ICD-10-CM

## 2019-09-23 MED ORDER — ALBUTEROL SULFATE (2.5 MG/3ML) 0.083% IN NEBU: 2.5000 mg | INHALATION_SOLUTION | RESPIRATORY_TRACT | 5 refills | Status: DC | PRN

## 2019-09-23 MED ORDER — ALBUTEROL SULFATE HFA 108 (90 BASE) MCG/ACT IN AERS: 2.0000 | INHALATION_SPRAY | Freq: Four times a day (QID) | RESPIRATORY_TRACT | 5 refills | Status: AC | PRN

## 2019-09-23 MED ORDER — GABAPENTIN 300 MG PO CAPS: 300.0000 mg | ORAL_CAPSULE | Freq: Three times a day (TID) | ORAL | 1 refills | Status: DC

## 2019-09-23 MED ORDER — PREDNISONE 10 MG PO TABS: ORAL_TABLET | ORAL | 0 refills | Status: AC

## 2019-09-23 MED ORDER — BREZTRI AEROSPHERE 160-9-4.8 MCG/ACT IN AERO
2.0000 | INHALATION_SPRAY | Freq: Two times a day (BID) | RESPIRATORY_TRACT | 0 refills | Status: DC
Start: 2019-09-23 — End: 2019-10-24

## 2019-09-23 MED ORDER — CYCLOBENZAPRINE HCL 10 MG PO TABS: 10.0000 mg | ORAL_TABLET | Freq: Two times a day (BID) | ORAL | 1 refills | Status: DC

## 2019-09-23 NOTE — Progress Notes (Signed)
[name]  Chief Complaint  Patient presents with  . Consult    shortness of breath all the time    Constitutional:  BP 130/84 (BP Location: Left Arm, Cuff Size: Normal)   Pulse (!) 110   Temp (!) 97.3 F (36.3 C) (Oral)   Ht 5\' 8"  (1.727 m)   Wt 122 lb 6.4 oz (55.5 kg)   SpO2 92% Comment: 2L O2  BMI 18.61 kg/m   Past Medical History:  Anxiety, GERD, HLD, Insomnia, Back pain, Pneumonia, ANA positive  Summary:  Cameron Villarreal is a 54 y.o. male smoker with COPD/emphysema and chronic hypoxic/hypercapnic respiratory failure.  He had positive ANA and DS DNA Ab from March 2020.  Subjective:   He was previously seen by Dr. Luan Pulling.  He continues to smoke cigarettes and is down to 1/2 ppd.  He was smoking 1 to 2 ppd before.  He was seen in ER on 09/18/19.  He was started on prednisone and doxycycline for exacerbation  He is out of prednisone and breathing much worse.  He is coughing with yellow sputum.  Has chest tightness and wheeze.  Can't do any activity at home.  Not sure if he has a fever.  Denies sinus congestion, GI symptoms, or leg swelling.  Using 2 liters oxygen.  He has advair, but ran out of incruse.  Needs refill on albuterol.  His weight has been steady, but he can't increase his weight.  He had several relatives that had emphysema.  He is not sure if he ever was checked for A1AT.  He wasn't able to do breathing tests before because he was too short of breath.  He hasn't been set up with a new PCP yet.   Physical Exam:   Appearance - wearing oxygen, cachectic, using accessory muscles  ENMT - no sinus tenderness, no nasal discharge, no oral exudate, Mallampati 2, poor dentition, raspy voice  Respiratory - decreased breath sounds, prolonged exhalation, faint b/l wheezing  CV - regular rate, tachycardic, no murmurs  GI - soft, non tender  Lymph - no adenopathy noted in neck  Ext - no edema, decreased muscle bulk  Skin - no  rashes  Neuro - normal strength, oriented x 3  Psych - anxious  Discussion:  He has severe COPD with emphysema/bronchitis, bronchiectasis, chronic respiratory failure, and cachexia.  He has continue tobacco abuse.  He has family history of emphysema, but it is unclear whether he ever had A1AT level tested. He has persistent exacerbation.  I advised that he would be better served by hospital admission, but he didn't want to do this at this time.  He wants to try treatment at home.  Assessment/Plan:   COPD exacerbation. - will give extend course of prednisone - complete course of doxycycline from emergency room - advised him to go to the hospital if his symptoms get worse - will arrange for short term follow up with Dr. Elsworth Soho and then he can follow up with me afterward  Severe COPD with chronic bronchitis, emphysema, and bronchiectasis. - given him samples of breztri to use in place of advair - refilled albuterol - will eventually need to check A1AT level - will need to assess when he would be stable enough to do spirometry or PFT, but uncertain he will ever get to that point  Chronic respiratory failure with hypoxia and hypercapnia. - continue 2 liters oxygen 24/7 - might be a candidate for NIPPV if he  is agreeable  Rt lower lobe lung nodule.  - he will need f/u LDCT chest for November 2021  History of ANA and anti DS DNA positive from March 5009. - uncertain whether this is playing any role at present with his respiratory status - if so, then course of prednisone should help  Cachexia from COPD. - discussed options to help maintain his weight regarding caloric and protein intake  Tobacco abuse. - reviewed different options to help with smoking cessation - he will try nicotine patch - if he continues to have trouble with smoking cessation, then he would be willing to retry chantix  Chronic back pain. - will refill flexiril, gabapentin for now - will arrange for referral to  primary care for health maintenance  Goals of care. - he has advanced emphysema and continues to smoke cigarettes - he is running out of options to stabilize his condition - will need to discuss consideration for palliative care referral over his next few visits  A total of 64 minutes spent addressing patient care issues on day of visit.  Follow up:  Patient Instructions  Breztri two puffs in the morning and two puffs in the evening  Finish course of doxycycline from your recent ER visit  Prednisone 10 mg > 4 pills daily for 3 days, 3 pills daily for 3 days, 2 pills daily for 3 days, 1 pill daily for 3 days  Albuterol every 4 to 6 hours as needed for cough, wheeze, chest congestion, or shortness of breath  Go to the hospital if your symptoms get worse  Follow up in 2 weeks   Signature:  Chesley Mires, MD [place] Pager: [phone] 09/23/2019, 12:12 PM  Flow Sheet     Pulmonary tests:    Chest imaging:   LDCT chest 06/11/19 >> extensive centrilobular and paraseptal emphysema, lower lobe predominant BTX, RLL 11.4 mm nodule  Cardiac tests:   Echo 01/28/17 >> EF 50 to 55%, grade 1 DD, mod RV systolic dysfx  Medications:   Allergies as of 09/23/2019      Reactions   Codeine Itching, Nausea Only   Hydrocodone Itching, Swelling, Other (See Comments)   "Makes it difficult to swallow," but no breathing impairment noted Patient state he can take Oxycodone took it for his arm fracture recently.      Medication List       Accurate as of September 23, 2019 12:12 PM. If you have any questions, ask your nurse or doctor.        STOP taking these medications   Incruse Ellipta 62.5 MCG/INH Aepb Generic drug: umeclidinium bromide Stopped by: Chesley Mires, MD     TAKE these medications   acetaminophen 500 MG tablet Commonly known as: TYLENOL Take 500-1,000 mg by mouth every 6 (six) hours as needed (for pain or headaches).   albuterol (2.5 MG/3ML)  0.083% nebulizer solution Commonly known as: PROVENTIL Take 3 mLs (2.5 mg total) by nebulization every 4 (four) hours as needed for wheezing or shortness of breath.   albuterol 108 (90 Base) MCG/ACT inhaler Commonly known as: VENTOLIN HFA Inhale 2 puffs into the lungs 4 (four) times daily as needed.   cyclobenzaprine 10 MG tablet Commonly known as: FLEXERIL Take 1 tablet (10 mg total) by mouth 2 (two) times daily.   doxycycline 100 MG capsule Commonly known as: VIBRAMYCIN Take 1 capsule (100 mg total) by mouth 2 (two) times daily.   Fluticasone-Salmeterol 250-50 MCG/DOSE Aepb Commonly known as: Advair Diskus  Inhale 1 puff into the lungs 2 (two) times daily.   gabapentin 300 MG capsule Commonly known as: NEURONTIN Take 1 capsule (300 mg total) by mouth 3 (three) times daily.   predniSONE 10 MG tablet Commonly known as: DELTASONE Take 4 tablets (40 mg total) by mouth daily with breakfast for 3 days, THEN 3 tablets (30 mg total) daily with breakfast for 3 days, THEN 2 tablets (20 mg total) daily with breakfast for 3 days, THEN 1 tablet (10 mg total) daily with breakfast for 3 days. Start taking on: September 23, 2019 What changed:   medication strength  See the new instructions. Changed by: Chesley Mires, MD   traZODone 50 MG tablet Commonly known as: DESYREL Take 50 mg by mouth at bedtime.       Past Surgical History:  He  has a past surgical history that includes Cholecystectomy; Appendectomy; Right inguinal hernia repair; Hernia repair; Knee surgery (Right); Colonoscopy; ORIF humerus fracture (Right, 11/17/2017); and Open reduction internal fixation (orif) distal radial fracture (Right, 11/17/2017).  Family History:  His family history includes Alcoholism in his father; Cirrhosis in his father; Depression in his brother; Stomach cancer in an other family member.  Social History:  He  reports that he has been smoking cigarettes. He has a 15.00 pack-year smoking history. He  has never used smokeless tobacco. He reports that he does not drink alcohol and does not use drugs.

## 2019-09-23 NOTE — Patient Instructions (Signed)
Breztri two puffs in the morning and two puffs in the evening  Finish course of doxycycline from your recent ER visit  Prednisone 10 mg > 4 pills daily for 3 days, 3 pills daily for 3 days, 2 pills daily for 3 days, 1 pill daily for 3 days  Albuterol every 4 to 6 hours as needed for cough, wheeze, chest congestion, or shortness of breath  Go to the hospital if your symptoms get worse  Follow up in 2 weeks

## 2019-09-24 ENCOUNTER — Telehealth: Payer: Self-pay | Admitting: Pulmonary Disease

## 2019-09-24 NOTE — Telephone Encounter (Signed)
-----   Message from Chesley Mires, MD sent at 09/23/2019  1:23 PM EDT ----- Kyra Manges this guy as consult in [place] today.  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  He really needed to go to hospital, but he didn't want to.  I asked to have him schedule in 2 weeks with you for a quick follow up, and then can follow up with me afterward.  Your schedule is full, so I was hoping to double book visit.  He won't travel to [facility] for visit with NP, and wouldn't be able to do video visit.  Thanks.  Vineet

## 2019-09-24 NOTE — Telephone Encounter (Signed)
Dr. Elsworth Soho,  Is there is a specific day or time you would like me to put this patient? -pr

## 2019-09-30 NOTE — Telephone Encounter (Signed)
Please place him on list  if there is cancellation, we can call him and schedule Otherwise have triage check in with him next week and we can double book him to see me if he is not feeling better

## 2019-10-01 NOTE — Telephone Encounter (Signed)
Patient information written down and put somewhere to be in compliance with HIPPA and to make Jake Samples aware as well for the week of 8/30 upon her return.  ta

## 2019-10-01 NOTE — Telephone Encounter (Signed)
Cameron Villarreal - can you please write this patient's information down for the [place] office to call him in case we have any cancellations with Dr. Elsworth Soho. - Also sending this message to follow up with him next week -pr

## 2019-10-02 ENCOUNTER — Telehealth: Payer: Self-pay | Admitting: Pulmonary Disease

## 2019-10-02 NOTE — Telephone Encounter (Signed)
Spoke with the pt's mother  She states last day of pred and abs tomorrow and pt not improved much  Still feeling bad and breathing not back at baseline  Per last note regarding this, RA ok with overbook if pt feeling bad still so appt scheduled for 10/07/19 at 11 am  Call sooner if needed

## 2019-10-07 ENCOUNTER — Other Ambulatory Visit: Payer: Self-pay

## 2019-10-07 ENCOUNTER — Encounter: Payer: Self-pay | Admitting: Pulmonary Disease

## 2019-10-07 ENCOUNTER — Ambulatory Visit: Payer: Medicare HMO | Admitting: Pulmonary Disease

## 2019-10-07 VITALS — BP 128/86 | HR 114 | Ht 68.0 in | Wt 125.0 lb

## 2019-10-07 DIAGNOSIS — J471 Bronchiectasis with (acute) exacerbation: Secondary | ICD-10-CM | POA: Diagnosis not present

## 2019-10-07 DIAGNOSIS — J441 Chronic obstructive pulmonary disease with (acute) exacerbation: Secondary | ICD-10-CM | POA: Diagnosis not present

## 2019-10-07 DIAGNOSIS — R69 Illness, unspecified: Secondary | ICD-10-CM | POA: Diagnosis not present

## 2019-10-07 MED ORDER — PREDNISONE 10 MG PO TABS: ORAL_TABLET | ORAL | 0 refills | Status: AC

## 2019-10-07 MED ORDER — AMOXICILLIN-POT CLAVULANATE 875-125 MG PO TABS: 1.0000 | ORAL_TABLET | Freq: Two times a day (BID) | ORAL | 0 refills | Status: DC

## 2019-10-07 MED ORDER — SODIUM CHLORIDE 3 % IN NEBU
INHALATION_SOLUTION | RESPIRATORY_TRACT | 12 refills | Status: DC | PRN
Start: 2019-10-07 — End: 2020-04-14

## 2019-10-07 MED ORDER — BREZTRI AEROSPHERE 160-9-4.8 MCG/ACT IN AERO
2.0000 | INHALATION_SPRAY | Freq: Two times a day (BID) | RESPIRATORY_TRACT | 0 refills | Status: DC
Start: 2019-10-07 — End: 2019-10-24

## 2019-10-07 MED ORDER — ALBUTEROL SULFATE (2.5 MG/3ML) 0.083% IN NEBU: 2.5000 mg | INHALATION_SOLUTION | RESPIRATORY_TRACT | 5 refills | Status: DC | PRN

## 2019-10-07 NOTE — Addendum Note (Signed)
Addended by: Lia Foyer R on: 10/07/2019 12:03 PM   Modules accepted: Orders

## 2019-10-07 NOTE — Progress Notes (Signed)
   Subjective:    Patient ID: Cameron Villarreal, male    DOB: [birth year], 54 y.o.   MRN: [id number]  HPI  Former Cameron Villarreal pt with severe COPD and non-resolving exacerbation. 54 year old smoker with COPD and chronic respiratory failure on 2 L oxygen, seen by my partner Dr. Halford Chessman on 8/16 and treated for COPD exacerbation with extended course of prednisone and doxycycline  PMH - severe COPD with emphysema/bronchitis, bronchiectasis, chronic respiratory failure, and cachexia  Chief Complaint  Patient presents with  . Follow-up    Patient states that he does not feel good. He has been coughing more with yellow sputum. States he is out of his prednisone and his nebulizer medication. Been taking making Mucinex for the last couple days. Patient is on 2 liters all the time. States he has a hernia that is causing a lot of pain and hard for him to walk.   He completed doxycycline course of prednisone but still feels significantly short of breath. Coughing up yellow sputum. He has cut down his cigarettes to 8 from 2 packs/day but is unable to quit completely Feels Breztri better than his previous inhaler  Chest x-ray 8/11 personally reviewed shows hyperinflation and coarse interstitial infiltrates unchanged from 2019   Significant tests/ events reviewed  LDCT chest 06/2019 >> extensive emphysema, multiple bilateral pulmonary nodules, right lower lobe 11 mm unchanged from 2019, 6 mm right lower lobe is new  Review of Systems Patient denies significant  hemoptysis,  chest pain, palpitations, pedal edema, orthopnea, paroxysmal nocturnal dyspnea, lightheadedness, nausea, vomiting, abdominal or  leg pains      Objective:   Physical Exam   Gen. Thin, anxious man, in no distress ENT - no thrush, no pallor/icterus,no post nasal drip Neck: No JVD, no thyromegaly, no carotid bruits Lungs: + use of accessory muscles, no dullness to percussion, decreased BL without rales, faint scattered rhonchi   Cardiovascular: Rhythm regular, heart sounds  normal, no murmurs or gallops, no peripheral edema Musculoskeletal: No deformities, no cyanosis or clubbing         Assessment & Plan:

## 2019-10-07 NOTE — Assessment & Plan Note (Signed)
Slow to resolve exacerbation, we will treat him with another course of antibiotic and a longer course of prednisone  Augmentin 875 twice daily for 7 days  Prednisone 10 mg tabs Take 4 tabs  daily with food x 4 days, then 3 tabs daily x 4 days, then 2 tabs daily x 4 days, then 1 tab daily x4 days then stop. #40  Stay on Breztri Refill on albuterol nebs

## 2019-10-07 NOTE — Assessment & Plan Note (Signed)
Sputum culture to ensure no resistant organisms Emphasized airway clearance -use Mucinex Prescription for hypertonic saline nebs twice daily #60

## 2019-10-07 NOTE — Patient Instructions (Addendum)
Sputum culture  Augmentin 875 twice daily for 7 days  Prednisone 10 mg tabs Take 4 tabs  daily with food x 4 days, then 3 tabs daily x 4 days, then 2 tabs daily x 4 days, then 1 tab daily x4 days then stop. #40  Stay on Breztri Refill on albuterol nebs Prescription for hypertonic saline nebs twice daily #60 for airway clearance

## 2019-10-07 NOTE — Addendum Note (Signed)
Addended by: Lia Foyer R on: 10/07/2019 12:39 PM   Modules accepted: Orders

## 2019-10-10 DIAGNOSIS — J449 Chronic obstructive pulmonary disease, unspecified: Secondary | ICD-10-CM | POA: Diagnosis not present

## 2019-10-16 ENCOUNTER — Other Ambulatory Visit: Payer: Self-pay

## 2019-10-16 DIAGNOSIS — J471 Bronchiectasis with (acute) exacerbation: Secondary | ICD-10-CM

## 2019-10-17 ENCOUNTER — Other Ambulatory Visit: Payer: Self-pay

## 2019-10-17 DIAGNOSIS — J471 Bronchiectasis with (acute) exacerbation: Secondary | ICD-10-CM

## 2019-10-18 DIAGNOSIS — J471 Bronchiectasis with (acute) exacerbation: Secondary | ICD-10-CM | POA: Diagnosis not present

## 2019-10-24 ENCOUNTER — Ambulatory Visit: Payer: Medicare HMO | Admitting: Pulmonary Disease

## 2019-10-24 ENCOUNTER — Telehealth: Payer: Self-pay | Admitting: Pulmonary Disease

## 2019-10-24 DIAGNOSIS — R69 Illness, unspecified: Secondary | ICD-10-CM | POA: Diagnosis not present

## 2019-10-24 MED ORDER — ALBUTEROL SULFATE (2.5 MG/3ML) 0.083% IN NEBU: 2.5000 mg | INHALATION_SOLUTION | RESPIRATORY_TRACT | 5 refills | Status: DC | PRN

## 2019-10-24 MED ORDER — BREZTRI AEROSPHERE 160-9-4.8 MCG/ACT IN AERO: 2.0000 | INHALATION_SPRAY | Freq: Two times a day (BID) | RESPIRATORY_TRACT | 11 refills | Status: DC

## 2019-10-24 NOTE — Telephone Encounter (Signed)
Rx for breztri and albuterol neb sol have been sent to [facility] at pt's request. Called and spoke with pt letting him know this had been done and he verbalized understanding. Nothing further needed.

## 2019-10-29 ENCOUNTER — Telehealth: Payer: Self-pay | Admitting: Pulmonary Disease

## 2019-10-29 NOTE — Telephone Encounter (Signed)
PA request received from [facility] requested: Cameron Villarreal CMM Key: [id number] Tried/failed: on file Covered alternatives: Symbicort, Trelegy, Breo, Advair PA request has been sent to plan, and a determination is expected within 3 days.   Routing to [facility] for follow-up.

## 2019-11-01 ENCOUNTER — Telehealth: Payer: Self-pay

## 2019-11-01 ENCOUNTER — Telehealth: Payer: Self-pay | Admitting: Pulmonary Disease

## 2019-11-01 MED ORDER — AZITHROMYCIN 250 MG PO TABS
250.0000 mg | ORAL_TABLET | ORAL | 0 refills | Status: DC
Start: 2019-11-01 — End: 2020-01-17

## 2019-11-01 MED ORDER — BREZTRI AEROSPHERE 160-9-4.8 MCG/ACT IN AERO: 2.0000 | INHALATION_SPRAY | Freq: Two times a day (BID) | RESPIRATORY_TRACT | 0 refills | Status: DC

## 2019-11-01 MED ORDER — PREDNISONE 10 MG PO TABS
ORAL_TABLET | ORAL | 0 refills | Status: DC
Start: 2019-11-01 — End: 2020-01-17

## 2019-11-01 NOTE — Telephone Encounter (Signed)
VS pt

## 2019-11-01 NOTE — Telephone Encounter (Signed)
Spoke with the pt and notified of recs per Dr Halford Chessman Rxs sent to [facility]

## 2019-11-01 NOTE — Telephone Encounter (Signed)
Patient checking status of antibotic and Prednisone. Pharmacy is [facility]. Patient phone number is [phone] c of [phone] h.

## 2019-11-01 NOTE — Telephone Encounter (Signed)
Called and spoke with the pt  He was given augementin and pred taper last ov with RA on 10/07/19  He states that this helped with his cough and congestion until a few days ago  Now having increased cough with moderate amount of yellow sputum  He states not having much SOB, breathing at baseline  He denies any f/c/s, new body aches  Still on breztri, albuterol nebs and saline nebs  Has had covid vaccines  Requesting more pred and abx  Please advise thanks! Allergies  Allergen Reactions  . Codeine Itching and Nausea Only  . Hydrocodone Itching, Swelling and Other (See Comments)    "Makes it difficult to swallow," but no breathing impairment noted Patient state he can take Oxycodone took it for his arm fracture recently.

## 2019-11-01 NOTE — Telephone Encounter (Signed)
Can send zpak.  Can send script for prednisone 10 mg pill >> 3 pills daily for 2 days, 2 pills daily for 2 days, 1 pill daily for 2 days.

## 2019-11-01 NOTE — Telephone Encounter (Signed)
Called and spoke with patient about Cameron Villarreal prior authorization being approved and confirmed with [employer] that refills of Cameron Villarreal are available for the patient. Patient expressed understanding.

## 2019-11-01 NOTE — Telephone Encounter (Signed)
Patient mother came in to [place] office asking for Breztri samples to help cover until they get them by mail. Provided 2 samples.

## 2019-11-09 DIAGNOSIS — J449 Chronic obstructive pulmonary disease, unspecified: Secondary | ICD-10-CM | POA: Diagnosis not present

## 2019-11-18 ENCOUNTER — Other Ambulatory Visit (HOSPITAL_COMMUNITY): Payer: Self-pay

## 2019-11-18 DIAGNOSIS — Z87891 Personal history of nicotine dependence: Secondary | ICD-10-CM

## 2019-11-18 DIAGNOSIS — Z122 Encounter for screening for malignant neoplasm of respiratory organs: Secondary | ICD-10-CM

## 2019-11-18 NOTE — Progress Notes (Signed)
LDCT Nodule follow-up order placed. Patient scheduled for 12/16/2019 at 3pm. Patient aware.

## 2019-11-19 ENCOUNTER — Ambulatory Visit: Payer: Medicare HMO | Admitting: Pulmonary Disease

## 2019-12-10 DIAGNOSIS — J449 Chronic obstructive pulmonary disease, unspecified: Secondary | ICD-10-CM | POA: Diagnosis not present

## 2019-12-10 IMAGING — CT CT ELBOW*R* W/O CM
3 of 4 series · 14 of 35 positions shown, 17 images · non-contrast
Comparison: 11/13/2017 radiographs

CLINICAL DATA: Supracondylar fracture

EXAM:
CT OF THE LOWER RIGHT EXTREMITY WITHOUT CONTRAST
TECHNIQUE: Multidetector CT imaging of the right lower extremity was performed
according to the standard protocol.

[Series 4: lfov ext 3.0 i31s 2 · axial · 0.27mm/px · z∈[+397,+562]mm · 8 of 65 slices shown, 10 images]
[im 5/65  soft-tissue]
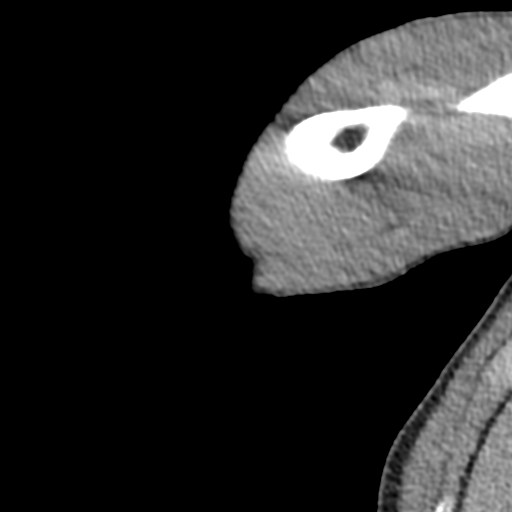
[im 5/65  bone]
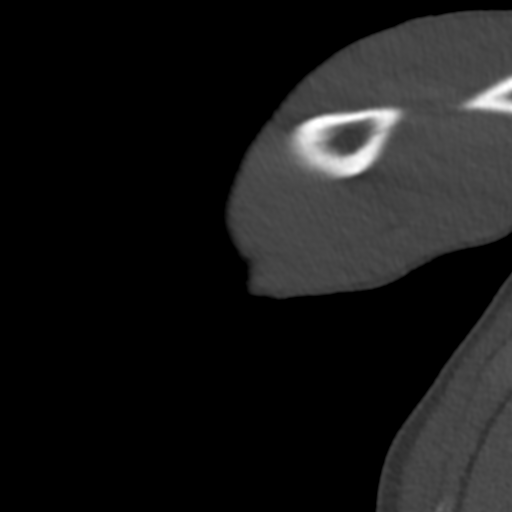
[im 15/65  bone]
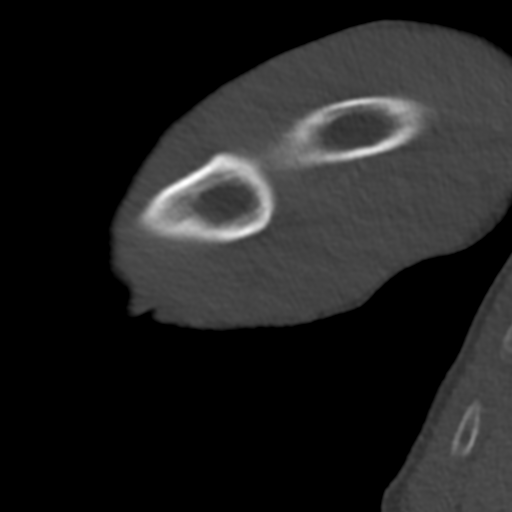
[im 20/65  bone]
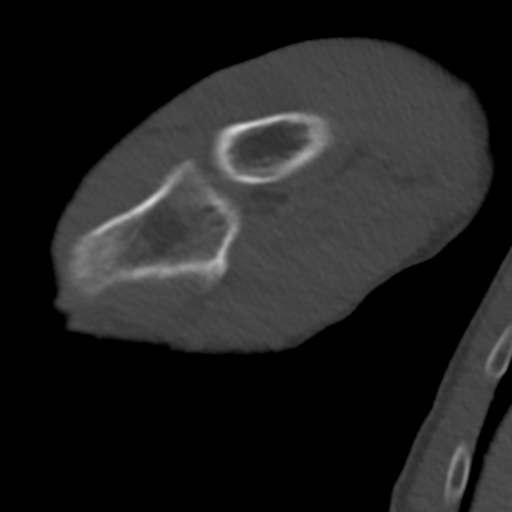
[im 30/65  bone]
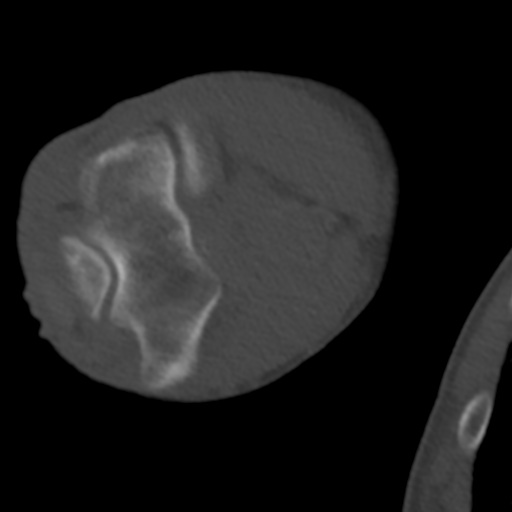
[im 35/65  soft-tissue]
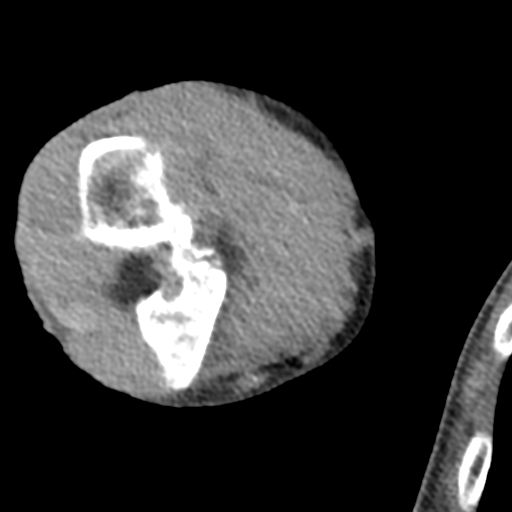
[im 35/65  bone]
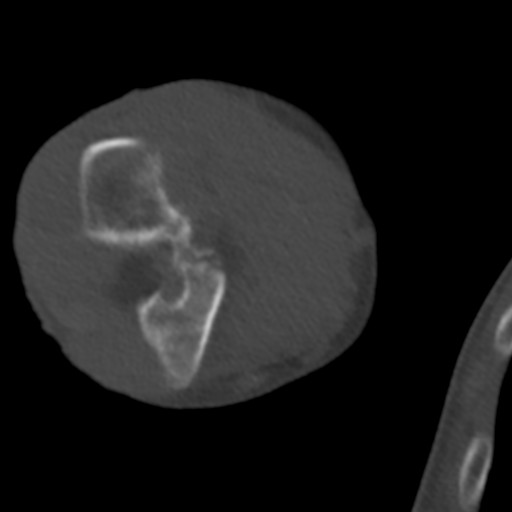
[im 45/65  bone]
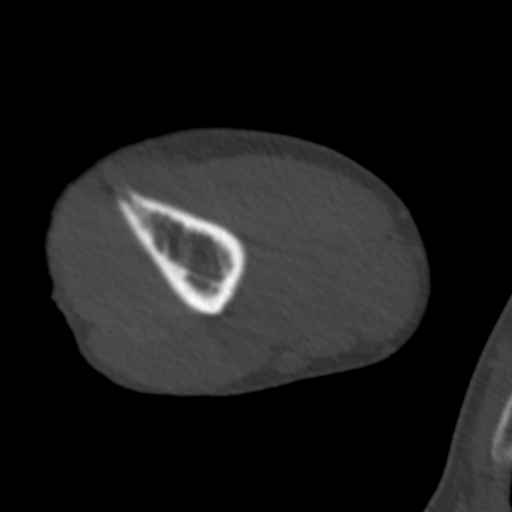
[im 50/65  bone]
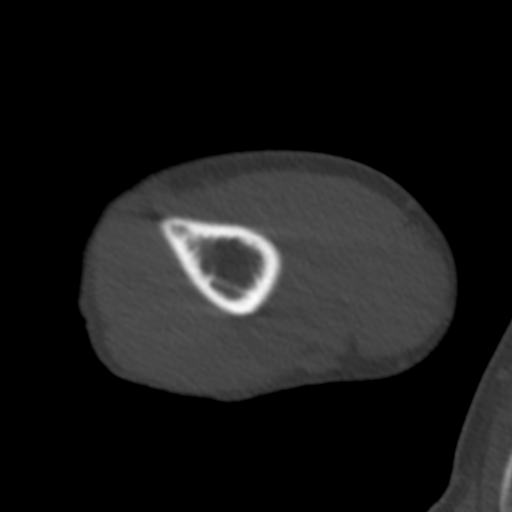
[im 60/65  bone]
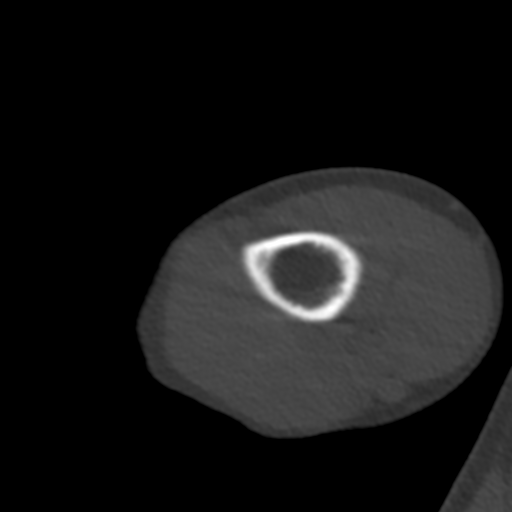

[Series 9: cor st · sagittal · 0.33mm/px · 5 of 38 slices shown, 6 images]
[im 13/38  bone]
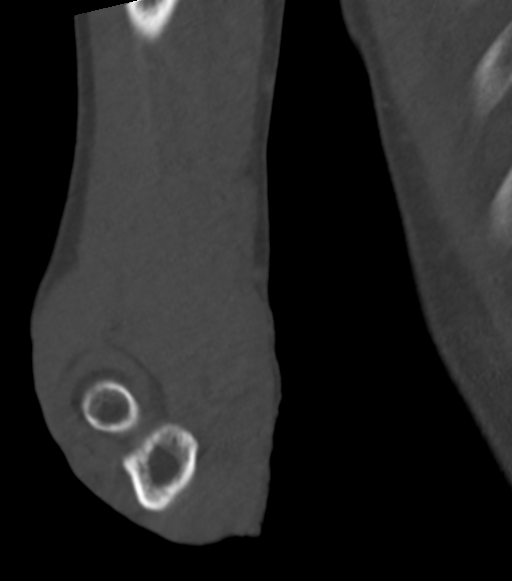
[im 16/38  bone]
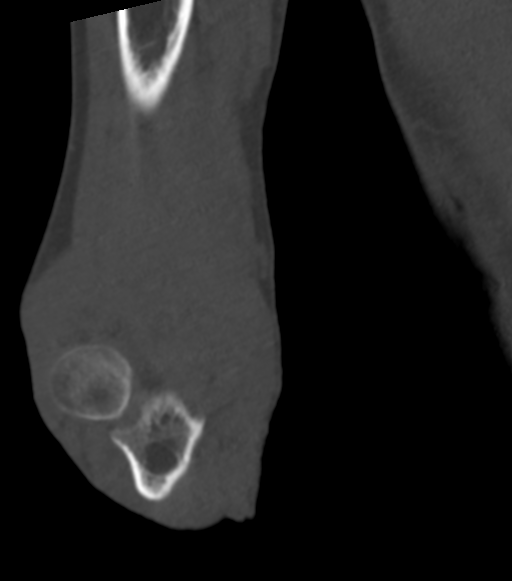
[im 19/38  soft-tissue]
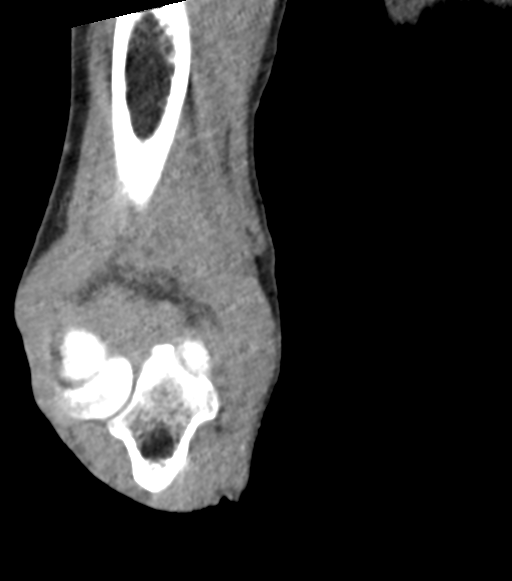
[im 19/38  bone]
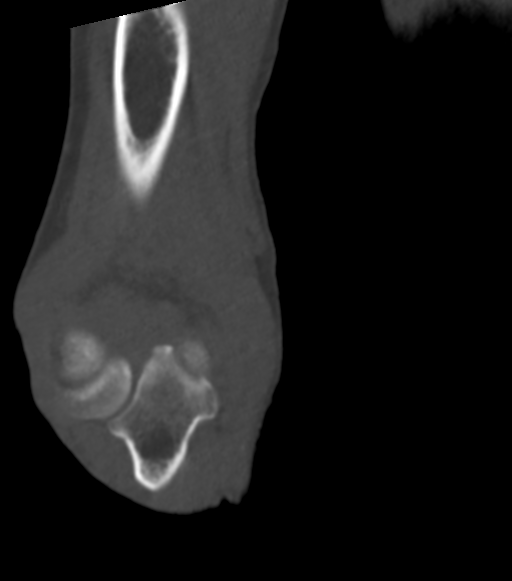
[im 22/38  bone]
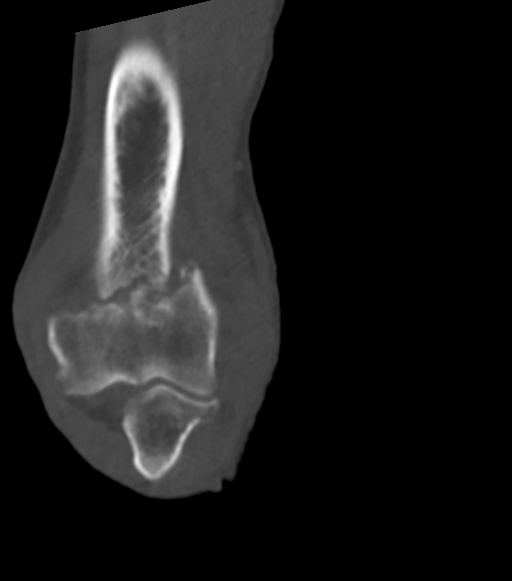
[im 25/38  bone]
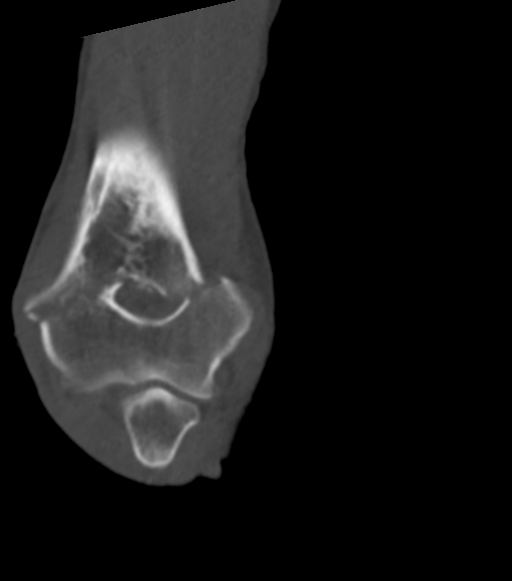

[Series 11: sag bone · coronal · 0.32mm/px · 1 of 38 slices shown]
[im 19/38  bone]
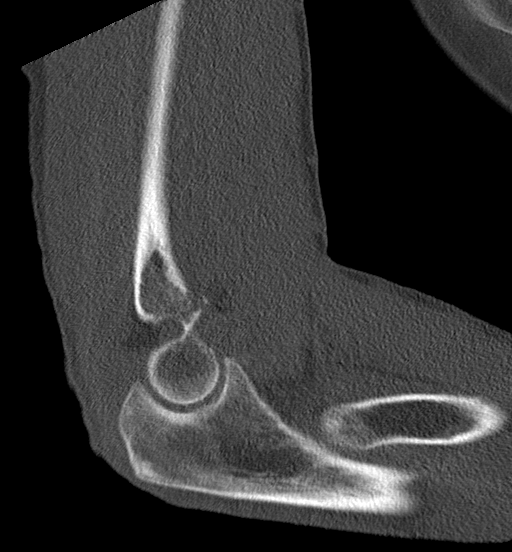

[14 of 35 positions shown; findings below may reference images not displayed]

FINDINGS: Bones/Joint/Cartilage

Suboptimal position due to patient's condition and fractures.

Transverse supracondylar fracture of the humerus, with 1.1 cm of
anterior displacement of the distal fragment medially on image
[DATE], and 0.4 cm of anterior displacement of the lateral portion of
the distal fracture fragment. As expected there is a elbow joint
effusion.

Subtle irregularity along the posterior margin of the lateral
humeral condyle on image [DATE], probably from streak artifact and
less likely to be due to a small fracture. Similarly, faint density
adjacent to the coronoid process of the ulna on image [DATE] is
thought to probably be artifactual rather than a small avulsion
fracture.

The radial head appears intact.

Ligaments

Suboptimally assessed by CT.

Muscles and Tendons

Grossly unremarkable

Soft tissues

Unremarkable
IMPRESSION: 1. Supracondylar fracture of the distal humerus, with 1.1 cm of
anterior displacement of the distal fracture fragment medially, and
0.4 cm of anterior displacement of the distal fracture fragment
laterally. Associated elbow joint effusion.
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
## 2019-12-12 ENCOUNTER — Other Ambulatory Visit: Payer: Self-pay | Admitting: Internal Medicine

## 2019-12-12 ENCOUNTER — Telehealth: Payer: Self-pay | Admitting: Pulmonary Disease

## 2019-12-12 ENCOUNTER — Other Ambulatory Visit: Payer: Self-pay | Admitting: Pulmonary Disease

## 2019-12-12 DIAGNOSIS — R69 Illness, unspecified: Secondary | ICD-10-CM | POA: Diagnosis not present

## 2019-12-12 MED ORDER — ALBUTEROL SULFATE (2.5 MG/3ML) 0.083% IN NEBU: 2.5000 mg | INHALATION_SOLUTION | RESPIRATORY_TRACT | 5 refills | Status: DC | PRN

## 2019-12-12 NOTE — Telephone Encounter (Signed)
You refilled this in August when you seen the patient stating that you would fill it for now until the patient gets in to see primary care. The patient still has not seen primary care. Okay to refill?

## 2019-12-12 NOTE — Telephone Encounter (Signed)
Called and spoke to patient, who is requesting refill on prednisone, gabapentin and albuterol solution.  Patient stated that he would like prednisone because it gives him an appetite. He stated that his sx are baseline and unchanged since last OV. C/o cough with yellow mucus, sob with exertion and wheezing.  Denied fever, chills or sweats. Per last ov note, Gabapentin would be prescribed once more until patient seen PCP. Rx for albuterol soltuion has been sent to preferred pharmacy  Dr. Halford Chessman, please advise on prednisone and Gabapentin. Thank you.   *will not Audrey message urgent, as sx are baseline and prednisone is requested for appetite.

## 2019-12-12 NOTE — Telephone Encounter (Addendum)
Pt is requesting refill PREDNISONE 10 MG TABLET next ov 01/13/20. Pt has appt with next week with app.pt was inforrmed meds were refused and verbalized understanding

## 2019-12-13 NOTE — Telephone Encounter (Signed)
He needs an ROV first.

## 2019-12-13 NOTE — Telephone Encounter (Signed)
Spoke to patient's Mary(DPR) and relayed below message.  Stanton Kidney stated that she would call back to schedule OV with NP after patient's scheduled CT on 12/16/2019.

## 2019-12-16 ENCOUNTER — Ambulatory Visit (HOSPITAL_COMMUNITY): Admission: RE | Admit: 2019-12-16 | Payer: Medicare HMO | Source: Ambulatory Visit

## 2019-12-18 NOTE — Telephone Encounter (Signed)
Spoke to patient's Mother, mary (DPR). Stanton Kidney stated that patient does not wish to make an appointment at this time, due to increased weakness and not being able to get out off bed.  Stanton Kidney will call back to schedule an appointment when able.  Nothing further needed.

## 2019-12-27 ENCOUNTER — Encounter (HOSPITAL_COMMUNITY): Payer: Self-pay

## 2020-01-07 ENCOUNTER — Ambulatory Visit (HOSPITAL_COMMUNITY): Admission: RE | Admit: 2020-01-07 | Payer: Medicare HMO | Source: Ambulatory Visit

## 2020-01-07 ENCOUNTER — Encounter (HOSPITAL_COMMUNITY): Payer: Self-pay

## 2020-01-09 DIAGNOSIS — J449 Chronic obstructive pulmonary disease, unspecified: Secondary | ICD-10-CM | POA: Diagnosis not present

## 2020-01-13 ENCOUNTER — Ambulatory Visit: Payer: Medicare HMO | Admitting: Pulmonary Disease

## 2020-01-13 ENCOUNTER — Ambulatory Visit: Payer: Medicare HMO | Admitting: Internal Medicine

## 2020-01-17 ENCOUNTER — Encounter: Payer: Self-pay | Admitting: Pulmonary Disease

## 2020-01-17 ENCOUNTER — Ambulatory Visit: Payer: Medicare HMO | Admitting: Pulmonary Disease

## 2020-01-17 ENCOUNTER — Other Ambulatory Visit: Payer: Self-pay

## 2020-01-17 VITALS — BP 118/64 | HR 112 | Temp 97.4°F | Ht 68.0 in | Wt 128.0 lb

## 2020-01-17 DIAGNOSIS — R911 Solitary pulmonary nodule: Secondary | ICD-10-CM

## 2020-01-17 DIAGNOSIS — J449 Chronic obstructive pulmonary disease, unspecified: Secondary | ICD-10-CM | POA: Diagnosis not present

## 2020-01-17 DIAGNOSIS — J9611 Chronic respiratory failure with hypoxia: Secondary | ICD-10-CM

## 2020-01-17 DIAGNOSIS — Z720 Tobacco use: Secondary | ICD-10-CM

## 2020-01-17 DIAGNOSIS — J432 Centrilobular emphysema: Secondary | ICD-10-CM

## 2020-01-17 DIAGNOSIS — J9612 Chronic respiratory failure with hypercapnia: Secondary | ICD-10-CM | POA: Diagnosis not present

## 2020-01-17 DIAGNOSIS — R69 Illness, unspecified: Secondary | ICD-10-CM | POA: Diagnosis not present

## 2020-01-17 DIAGNOSIS — J479 Bronchiectasis, uncomplicated: Secondary | ICD-10-CM

## 2020-01-17 DIAGNOSIS — R64 Cachexia: Secondary | ICD-10-CM | POA: Diagnosis not present

## 2020-01-17 MED ORDER — ALBUTEROL SULFATE (2.5 MG/3ML) 0.083% IN NEBU: 2.5000 mg | INHALATION_SOLUTION | RESPIRATORY_TRACT | 5 refills | Status: DC | PRN

## 2020-01-17 MED ORDER — NICOTINE 14 MG/24HR TD PT24: 14.0000 mg | MEDICATED_PATCH | Freq: Every day | TRANSDERMAL | 0 refills | Status: DC

## 2020-01-17 MED ORDER — BREZTRI AEROSPHERE 160-9-4.8 MCG/ACT IN AERO
2.0000 | INHALATION_SPRAY | Freq: Two times a day (BID) | RESPIRATORY_TRACT | 0 refills | Status: DC
Start: 2020-01-17 — End: 2020-04-08

## 2020-01-17 MED ORDER — GABAPENTIN 300 MG PO CAPS: 300.0000 mg | ORAL_CAPSULE | Freq: Three times a day (TID) | ORAL | 1 refills | Status: DC

## 2020-01-17 MED ORDER — BREZTRI AEROSPHERE 160-9-4.8 MCG/ACT IN AERO: 2.0000 | INHALATION_SPRAY | Freq: Two times a day (BID) | RESPIRATORY_TRACT | 5 refills | Status: DC

## 2020-01-17 MED ORDER — PREDNISONE 2.5 MG PO TABS: ORAL_TABLET | ORAL | 0 refills | Status: AC

## 2020-01-17 NOTE — Progress Notes (Signed)
[facility]  Chief Complaint  Patient presents with  . Follow-up    Productive cough with brown/yellow phlegm    Constitutional:  BP 118/64 (BP Location: Left Arm, Cuff Size: Normal)   Pulse (!) 112   Temp (!) 97.4 F (36.3 C) (Other (Comment)) Comment (Src): wrist  Ht 5\' 8"  (1.727 m)   Wt 128 lb (58.1 kg)   SpO2 94% Comment: 2L O2  BMI 19.46 kg/m   Past Medical History:  Anxiety, GERD, HLD, Insomnia, Back pain, Pneumonia, ANA positive  Past Surgical History:  His  has a past surgical history that includes Cholecystectomy; Appendectomy; Right inguinal hernia repair; Hernia repair; Knee surgery (Right); Colonoscopy; ORIF humerus fracture (Right, 11/17/2017); and Open reduction internal fixation (orif) distal radial fracture (Right, 11/17/2017).  Brief Summary:  Cameron Villarreal is a 54 y.o. male smoker with COPD/emphysema and chronic hypoxic/hypercapnic respiratory failure.  He had positive ANA and DS DNA Ab from March 2020.      Subjective:   He continues to smoke cigarettes.  Smoking 1/2 ppd.  He didn't know he could get nicotine patch.  Uses 2 liters oxygen 24/7.  Has cough with yellow to brown sputum.  Gets a rattle in his chest.  Intermittent wheezing.  Breztri helps.    Physical Exam:   Appearance - thin, wearing oxygen  ENMT - no sinus tenderness, no oral exudate, no LAN, Mallampati 2 airway, no stridor, edentulous, raspy voice  Respiratory - decreased breath sounds bilaterally, scattered rhonchi, no wheezing or rales  CV - s1s2 regular rate and rhythm, no murmurs  Ext - no clubbing, no edema  Skin - no rashes  Psych - normal mood and affect   Pulmonary testing:    Chest Imaging:   LDCT chest 06/11/19 >> extensive centrilobular and paraseptal emphysema, lower lobe predominant BTX, RLL 11.4 mm nodule  Cardiac Tests:   Echo 01/28/17 >> EF 50 to 55%, grade 1 DD, mod RV systolic dysfx  Social History:  He   reports that he has been smoking cigarettes. He has a 7.50 pack-year smoking history. He has never used smokeless tobacco. He reports that he does not drink alcohol and does not use drugs.  Family History:  His family history includes Alcoholism in his father; Cirrhosis in his father; Depression in his brother; Stomach cancer in an other family member.     Assessment/Plan:   Severe COPD with chronic bronchitis, emphysema, and bronchiectasis. - will start him on prednisone 5 mg daily for 2 weeks, then remain on 2.5 mg daily until his next visit - continue breztri - prn albuterol - will arrange for PFT and A1AT level - prn mucinex, hypertonic saline  Chronic respiratory failure with hypoxia and hypercapnia. - goal SpO2 > 90% - continue 2 liters oxygen 24/7 - uses [place] lower lobe lung nodule.  - he was to have f/u low dose CT chest in November - will reorder this  History of ANA and anti DS DNA positive from March 2409. - uncertain whether this is playing any role at present with his respiratory status - if so, then course of prednisone should help  Cachexia from COPD. - discussed options to help maintain his weight regarding caloric and protein intake  Tobacco abuse. - reviewed different options to help with smoking cessation - he will try nicotine patch  Chronic back pain. - he has not seen PCP yet - will refill his gabapentin for  now  Time Spent Involved in Patient Care on Day of Examination:  32 minutes  Follow up:  Patient Instructions  Prednisone 5 mg daily for 2 weeks, then 2.5 mg daily until next appointment  Will arrange for lab test and pulmonary function test  Follow up in 8 weeks   Medication List:   Allergies as of 01/17/2020      Reactions   Codeine Itching, Nausea Only   Hydrocodone Itching, Swelling, Other (See Comments)   "Makes it difficult to swallow," but no breathing impairment noted Patient state he can take  Oxycodone took it for his arm fracture recently.      Medication List       Accurate as of January 17, 2020 11:04 AM. If you have any questions, ask your nurse or doctor.        STOP taking these medications   amoxicillin-clavulanate 875-125 MG tablet Commonly known as: AUGMENTIN Stopped by: Chesley Mires, MD   azithromycin 250 MG tablet Commonly known as: ZITHROMAX Stopped by: Chesley Mires, MD   doxycycline 100 MG capsule Commonly known as: VIBRAMYCIN Stopped by: Chesley Mires, MD     TAKE these medications   acetaminophen 500 MG tablet Commonly known as: TYLENOL Take 500-1,000 mg by mouth every 6 (six) hours as needed (for pain or headaches).   albuterol 108 (90 Base) MCG/ACT inhaler Commonly known as: VENTOLIN HFA Inhale 2 puffs into the lungs 4 (four) times daily as needed.   albuterol (2.5 MG/3ML) 0.083% nebulizer solution Commonly known as: PROVENTIL Take 3 mLs (2.5 mg total) by nebulization every 4 (four) hours as needed for wheezing or shortness of breath.   Breztri Aerosphere 160-9-4.8 MCG/ACT Aero Generic drug: Budeson-Glycopyrrol-Formoterol Inhale 2 puffs into the lungs in the morning and at bedtime. What changed: Another medication with the same name was changed. Make sure you understand how and when to take each. Changed by: Chesley Mires, MD   Arnell Sieving [phone] MCG/ACT Aero Generic drug: Budeson-Glycopyrrol-Formoterol Inhale 2 puffs into the lungs in the morning and at bedtime. What changed: when to take this Changed by: Chesley Mires, MD   cyclobenzaprine 10 MG tablet Commonly known as: FLEXERIL Take 1 tablet (10 mg total) by mouth 2 (two) times daily.   gabapentin 300 MG capsule Commonly known as: NEURONTIN Take 1 capsule (300 mg total) by mouth 3 (three) times daily.   nicotine 14 mg/24hr patch Commonly known as: NICODERM CQ - dosed in mg/24 hours Place 1 patch (14 mg total) onto the skin daily. Started by: Chesley Mires, MD   predniSONE  2.5 MG tablet Commonly known as: DELTASONE Take 2 tablets (5 mg total) by mouth daily with breakfast for 14 days, THEN 1 tablet (2.5 mg total) daily with breakfast. Start taking on: January 17, 2020 What changed:   medication strength  See the new instructions. Changed by: Chesley Mires, MD   sodium chloride HYPERTONIC 3 % nebulizer solution Take by nebulization as needed for other.   traZODone 50 MG tablet Commonly known as: DESYREL Take 50 mg by mouth at bedtime.       Signature:  Chesley Mires, MD [place] Pager - [phone] 01/17/2020, 11:04 AM

## 2020-01-17 NOTE — Patient Instructions (Signed)
Prednisone 5 mg daily for 2 weeks, then 2.5 mg daily until next appointment  Will arrange for lab test and pulmonary function test  Follow up in 8 weeks

## 2020-02-03 ENCOUNTER — Ambulatory Visit (HOSPITAL_COMMUNITY): Admission: RE | Admit: 2020-02-03 | Payer: Medicare HMO | Source: Ambulatory Visit

## 2020-02-09 DIAGNOSIS — J449 Chronic obstructive pulmonary disease, unspecified: Secondary | ICD-10-CM | POA: Diagnosis not present

## 2020-02-14 ENCOUNTER — Ambulatory Visit: Payer: Medicare HMO | Admitting: Internal Medicine

## 2020-02-16 ENCOUNTER — Other Ambulatory Visit: Payer: Self-pay | Admitting: Pulmonary Disease

## 2020-02-18 ENCOUNTER — Ambulatory Visit (HOSPITAL_COMMUNITY): Admission: RE | Admit: 2020-02-18 | Payer: Medicare HMO | Source: Ambulatory Visit

## 2020-02-20 ENCOUNTER — Ambulatory Visit (INDEPENDENT_AMBULATORY_CARE_PROVIDER_SITE_OTHER): Payer: Medicare HMO | Admitting: Internal Medicine

## 2020-02-20 ENCOUNTER — Encounter: Payer: Self-pay | Admitting: Internal Medicine

## 2020-02-20 ENCOUNTER — Other Ambulatory Visit: Payer: Self-pay

## 2020-02-20 VITALS — BP 118/78 | HR 114 | Temp 98.0°F | Ht 68.0 in | Wt 126.0 lb

## 2020-02-20 DIAGNOSIS — R69 Illness, unspecified: Secondary | ICD-10-CM | POA: Diagnosis not present

## 2020-02-20 DIAGNOSIS — Z7689 Persons encountering health services in other specified circumstances: Secondary | ICD-10-CM

## 2020-02-20 DIAGNOSIS — R768 Other specified abnormal immunological findings in serum: Secondary | ICD-10-CM | POA: Insufficient documentation

## 2020-02-20 DIAGNOSIS — G4700 Insomnia, unspecified: Secondary | ICD-10-CM

## 2020-02-20 DIAGNOSIS — E440 Moderate protein-calorie malnutrition: Secondary | ICD-10-CM

## 2020-02-20 DIAGNOSIS — J449 Chronic obstructive pulmonary disease, unspecified: Secondary | ICD-10-CM

## 2020-02-20 DIAGNOSIS — M25521 Pain in right elbow: Secondary | ICD-10-CM

## 2020-02-20 DIAGNOSIS — K0889 Other specified disorders of teeth and supporting structures: Secondary | ICD-10-CM | POA: Diagnosis not present

## 2020-02-20 DIAGNOSIS — F17210 Nicotine dependence, cigarettes, uncomplicated: Secondary | ICD-10-CM | POA: Diagnosis not present

## 2020-02-20 DIAGNOSIS — Z720 Tobacco use: Secondary | ICD-10-CM

## 2020-02-20 DIAGNOSIS — G8929 Other chronic pain: Secondary | ICD-10-CM

## 2020-02-20 DIAGNOSIS — M25531 Pain in right wrist: Secondary | ICD-10-CM

## 2020-02-20 DIAGNOSIS — F419 Anxiety disorder, unspecified: Secondary | ICD-10-CM

## 2020-02-20 MED ORDER — GABAPENTIN 300 MG PO CAPS: 300.0000 mg | ORAL_CAPSULE | Freq: Three times a day (TID) | ORAL | 2 refills | Status: AC

## 2020-02-20 MED ORDER — TRAZODONE HCL 50 MG PO TABS: 50.0000 mg | ORAL_TABLET | Freq: Every day | ORAL | 2 refills | Status: AC

## 2020-02-20 NOTE — Assessment & Plan Note (Signed)
Restarted Trazodone If resistant, plan to escalate treatment

## 2020-02-20 NOTE — Patient Instructions (Signed)
Please continue taking Ensure supplements.  Please contact your Gastroenterologist for scheduling colonoscopy.  Please continue to follow up with Dr Halford Chessman for COPD treatment and lung cancer screening.  Please start taking Trazodone to help with sleep and anxiety.  We have sent refills of Gabapentin. Please have your pharmacy contact us for further refills.

## 2020-02-20 NOTE — Assessment & Plan Note (Signed)
Has h/o fracture, has plates in place Was on opioids in the past Currently on Gabapentin 300 mg TID, refills provided

## 2020-02-20 NOTE — Assessment & Plan Note (Signed)
Smokes 0.5 pack/day  Asked about quitting: confirms that he currently smokes cigarettes Advise to quit smoking: Educated about QUITTING to reduce the risk of cancer, cardio and cerebrovascular disease. Assess willingness: Unwilling to quit at this time, but is working on cutting back. Assist with counseling and pharmacotherapy: Counseled for 5 minutes and literature provided. Arrange for follow up: Follow up in 3 months and continue to offer help.

## 2020-02-20 NOTE — Assessment & Plan Note (Signed)
Care established Previous chart reviewed History and medications reviewed with the patient 

## 2020-02-20 NOTE — Progress Notes (Signed)
New Patient Office Visit  Subjective:  Patient ID: Cameron Villarreal, male    DOB: 10/17/1965  Age: 55 y.o. MRN: [id number]  CC:  Chief Complaint  Patient presents with  . New Patient (Initial Visit)    Here to establish care. No complaints today.    HPI Cameron Villarreal is a 55 year old male with PMH of severe COPD, chronic hypoxemic respiratory failure on home O2, chronic pain syndrome, elevated ANA and antidsDNA, anxiety and protein calorie malnutrition who presents for establishing care.  Patient had recent episode of COPD exacerbation, which was treated with steroids.  He is on 2 lpm home O2.  He states that he continues to smoke about 0.5 pack/day.  He uses Breztri and as needed albuterol for his COPD.  He has a history of chronic pain syndrome related to his history of elbow and wrist injuries.  He reports history of fractures of humerus and radius s/p surgical repair.  He was placed on gabapentin, which is helping with his pain.  He requests refills of gabapentin.  He has a history of severe anxiety and insomnia, for which he used to take trazodone.  He has poor dentition and takes only semisolid or liquid diet.  He is advised to take Ensure supplements.  He has had 2 doses of COVID vaccine.  Past Medical History:  Diagnosis Date  . Anxiety   . Anxiety disorder, unspecified   . Back pain   . Chest pain at rest 07/26/2011  . Chronic obstructive pulmonary disease, unspecified ([place])   . COPD (chronic obstructive pulmonary disease) (HCC)    Severe centrilobular emphysema, on home o2  . Cough   . Dyspnea   . GERD (gastroesophageal reflux disease)   . Hypercholesterolemia   . Insomnia   . Long term current use of opiate analgesic 04/12/2018  . Low back pain   . Nicotine dependence, unspecified, uncomplicated   . Other specified disorders of teeth and supporting structures   . Pneumonia   . Pulmonary nodule 07/04/2011  . RECTAL BLEEDING 04/21/2010   Qualifier: Diagnosis of  By:  Algernon Huxley NP, Vicente Males    . S/P colonoscopy March 2012   Normal  . S/P endoscopy March 2012   Reflux esophagitis, no ulcerations  . Shortness of breath   . Unilateral inguinal hernia, without obstruction or gangrene, not specified as recurrent   . Unspecified protein-calorie malnutrition ([place])     Past Surgical History:  Procedure Laterality Date  . APPENDECTOMY    . CHOLECYSTECTOMY    . COLONOSCOPY    . HERNIA REPAIR    . KNEE SURGERY Right    laceration wired back together  . OPEN REDUCTION INTERNAL FIXATION (ORIF) DISTAL RADIAL FRACTURE Right 11/17/2017   Procedure: OPEN REDUCTION INTERNAL FIXATION (ORIF) DISTAL RADIAL FRACTURE;  Surgeon: Shona Needles, MD;  Location: [facility];  Service: Orthopedics;  Laterality: Right;  . ORIF HUMERUS FRACTURE Right 11/17/2017   Procedure: OPEN REDUCTION INTERNAL FIXATION (ORIF) DISTAL HUMERUS FRACTURE;  Surgeon: Shona Needles, MD;  Location: [facility];  Service: Orthopedics;  Laterality: Right;  . Right inguinal hernia repair      Family History  Problem Relation Age of Onset  . Cirrhosis Father        Deceased, ETOH cirrhosis  . Alcoholism Father   . Depression Brother   . Stomach cancer Other        Aunt    Social History   Socioeconomic History  . Marital status:  Single    Spouse name: Not on file  . Number of children: Not on file  . Years of education: Not on file  . Highest education level: Not on file  Occupational History  . Not on file  Tobacco Use  . Smoking status: Current Every Day Smoker    Packs/day: 0.25    Years: 30.00    Pack years: 7.50    Types: Cigarettes  . Smokeless tobacco: Never Used  . Tobacco comment: smokes 1/2 pack per day 01/17/2020  Vaping Use  . Vaping Use: Never used  Substance and Sexual Activity  . Alcohol use: No    Alcohol/week: 1.0 standard drink    Types: 1 Standard drinks or equivalent per week    Comment: Occasional  . Drug use: No  . Sexual activity: Never  Other Topics Concern  . Not on  file  Social History Narrative   ** Merged History Encounter **       Social Determinants of Health   Financial Resource Strain: Not on file  Food Insecurity: Not on file  Transportation Needs: Not on file  Physical Activity: Not on file  Stress: Not on file  Social Connections: Not on file  Intimate Partner Violence: Not on file    ROS Review of Systems  Constitutional: Positive for activity change and fatigue. Negative for chills and fever.  HENT: Negative for congestion and sore throat.   Eyes: Negative for pain and discharge.  Respiratory: Positive for cough and shortness of breath.   Cardiovascular: Negative for chest pain and palpitations.  Gastrointestinal: Negative for constipation, diarrhea, nausea and vomiting.  Endocrine: Negative for polydipsia and polyuria.  Genitourinary: Negative for dysuria and hematuria.  Musculoskeletal: Positive for arthralgias. Negative for neck pain and neck stiffness.  Skin: Negative for rash.  Neurological: Negative for dizziness, weakness, numbness and headaches.  Psychiatric/Behavioral: Negative for agitation and behavioral problems.     Objective:   Today's Vitals: BP 118/78 (BP Location: Left Arm, Patient Position: Sitting, Cuff Size: Normal)   Pulse (!) 114   Temp 98 F (36.7 C) (Temporal)   Ht 5' 8" (1.727 m)   Wt 126 lb (57.2 kg)   SpO2 96%   BMI 19.16 kg/m   Physical Exam Vitals reviewed.  Constitutional:      General: He is not in acute distress.    Appearance: He is not diaphoretic.  HENT:     Head: Normocephalic and atraumatic.     Nose: Nose normal.     Mouth/Throat:     Mouth: Mucous membranes are moist.  Eyes:     General: No scleral icterus.    Extraocular Movements: Extraocular movements intact.     Pupils: Pupils are equal, round, and reactive to light.  Cardiovascular:     Rate and Rhythm: Normal rate and regular rhythm.     Pulses: Normal pulses.     Heart sounds: Normal heart sounds. No murmur  heard.   Pulmonary:     Breath sounds: Normal breath sounds. No wheezing or rales.     Comments: On 2 lpm O2 Abdominal:     Palpations: Abdomen is soft.     Tenderness: There is no abdominal tenderness.  Musculoskeletal:     Cervical back: Neck supple. No tenderness.     Right lower leg: No edema.     Left lower leg: No edema.  Skin:    General: Skin is warm.     Findings: No rash.  Neurological:  General: No focal deficit present.     Mental Status: He is alert and oriented to person, place, and time.     Sensory: No sensory deficit.     Motor: No weakness.  Psychiatric:        Mood and Affect: Mood normal.        Behavior: Behavior normal.     Assessment & Plan:   Problem List Items Addressed This Visit      Respiratory   Chronic obstructive pulmonary disease, unspecified ([place])    Has chronic respiratory failure Still smokes 0.5 pack/day On Breztri and Albuterol (PRN) On 2 lpm home O2 On steroids for acute exacerbation currently Follows up with Pulmonology      Relevant Orders   CMP14+EGFR     Digestive   Other specified disorders of teeth and supporting structures    Does not have any teeth, no dentures Takes only semisolid and liquid food        Other   Chronic elbow pain, right (Primary Area of Pain) (Chronic)    Has h/o fracture, has plates in place Was on opioids in the past Currently on Gabapentin 300 mg TID, refills provided      Relevant Medications   traZODone (DESYREL) 50 MG tablet   gabapentin (NEURONTIN) 300 MG capsule (Start on 03/17/2020)   Wrist pain, chronic, right (Secondary Area of Pain) (Chronic)    On chronic pain management, was on opioid medications in the past On Gabapentin currently      Relevant Medications   traZODone (DESYREL) 50 MG tablet   gabapentin (NEURONTIN) 300 MG capsule (Start on 03/17/2020)   Tobacco abuse    Smokes 0.5 pack/day  Asked about quitting: confirms that he currently smokes cigarettes Advise to  quit smoking: Educated about QUITTING to reduce the risk of cancer, cardio and cerebrovascular disease. Assess willingness: Unwilling to quit at this time, but is working on cutting back. Assist with counseling and pharmacotherapy: Counseled for 5 minutes and literature provided. Arrange for follow up: Follow up in 3 months and continue to offer help.      Malnutrition of moderate degree (HCC)    Likely due to poor dentition Needs to follow up with Dentist Ensure supplements      Encounter to establish care - Primary    Care established Previous chart reviewed History and medications reviewed with the patient      Relevant Orders   CBC with Differential   CMP14+EGFR   Hemoglobin A1c   Lipid Profile   TSH   Vitamin D (25 hydroxy)   Anxiety disorder, unspecified    Restarted Trazodone If resistant, plan to escalate treatment      Relevant Medications   traZODone (DESYREL) 50 MG tablet   Insomnia    Restarted Trazodone      Positive ANA (antinuclear antibody)    Can be non-specific Also has Anti-dsDNA Has been on steroids chronically mainly for COPD         Outpatient Encounter Medications as of 02/20/2020  Medication Sig  . acetaminophen (TYLENOL) 500 MG tablet Take 500-1,000 mg by mouth every 6 (six) hours as needed (for pain or headaches).  Marland Kitchen albuterol (PROVENTIL) (2.5 MG/3ML) 0.083% nebulizer solution Take 3 mLs (2.5 mg total) by nebulization every 4 (four) hours as needed for wheezing or shortness of breath.  Marland Kitchen albuterol (VENTOLIN HFA) 108 (90 Base) MCG/ACT inhaler Inhale 2 puffs into the lungs 4 (four) times daily as needed.  . Budeson-Glycopyrrol-Formoterol (BREZTRI  AEROSPHERE) 160-9-4.8 MCG/ACT AERO Inhale 2 puffs into the lungs in the morning and at bedtime.  . Budeson-Glycopyrrol-Formoterol (BREZTRI AEROSPHERE) 160-9-4.8 MCG/ACT AERO Inhale 2 puffs into the lungs in the morning and at bedtime.  . cyclobenzaprine (FLEXERIL) 10 MG tablet Take 1 tablet (10 mg  total) by mouth 2 (two) times daily.  . nicotine (NICODERM CQ - DOSED IN MG/24 HOURS) 14 mg/24hr patch Place 1 patch (14 mg total) onto the skin daily.  . predniSONE (DELTASONE) 2.5 MG tablet Take 2 tablets (5 mg total) by mouth daily with breakfast for 14 days, THEN 1 tablet (2.5 mg total) daily with breakfast.  . sodium chloride HYPERTONIC 3 % nebulizer solution Take by nebulization as needed for other.  . [DISCONTINUED] gabapentin (NEURONTIN) 300 MG capsule Take 1 capsule (300 mg total) by mouth 3 (three) times daily.  . [DISCONTINUED] traZODone (DESYREL) 50 MG tablet Take 50 mg by mouth at bedtime.  Derrill Memo ON 03/17/2020] gabapentin (NEURONTIN) 300 MG capsule Take 1 capsule (300 mg total) by mouth 3 (three) times daily.  . traZODone (DESYREL) 50 MG tablet Take 1 tablet (50 mg total) by mouth at bedtime.   No facility-administered encounter medications on file as of 02/20/2020.    Follow-up: Return in about 3 months (around 05/20/2020).   Lindell Spar, MD

## 2020-02-20 NOTE — Assessment & Plan Note (Signed)
Does not have any teeth, no dentures Takes only semisolid and liquid food

## 2020-02-20 NOTE — Assessment & Plan Note (Signed)
Restarted Trazodone

## 2020-02-20 NOTE — Assessment & Plan Note (Signed)
Can be non-specific Also has Anti-dsDNA Has been on steroids chronically mainly for COPD

## 2020-02-20 NOTE — Assessment & Plan Note (Signed)
Likely due to poor dentition Needs to follow up with Dentist Ensure supplements

## 2020-02-20 NOTE — Assessment & Plan Note (Signed)
Has chronic respiratory failure Still smokes 0.5 pack/day On Breztri and Albuterol (PRN) On 2 lpm home O2 On steroids for acute exacerbation currently Follows up with Pulmonology

## 2020-02-20 NOTE — Assessment & Plan Note (Signed)
On chronic pain management, was on opioid medications in the past On Gabapentin currently

## 2020-02-24 ENCOUNTER — Telehealth: Payer: Self-pay | Admitting: Pulmonary Disease

## 2020-02-24 NOTE — Telephone Encounter (Signed)
Received a PA renewal for [employer]. PA was started on [email or web address]. Key is [id number]. Per CMM[employer], a determination will be received within the next 1-3 business days.   Will route to [place] for follow up.

## 2020-02-24 NOTE — Telephone Encounter (Signed)
PA has been approved 02/08/20-02/06/21. Pharmacy is aware of approval.

## 2020-02-27 ENCOUNTER — Ambulatory Visit (HOSPITAL_COMMUNITY): Admission: RE | Admit: 2020-02-27 | Payer: Medicare HMO | Source: Ambulatory Visit

## 2020-03-05 ENCOUNTER — Other Ambulatory Visit (HOSPITAL_COMMUNITY): Payer: Self-pay

## 2020-03-11 DIAGNOSIS — J449 Chronic obstructive pulmonary disease, unspecified: Secondary | ICD-10-CM | POA: Diagnosis not present

## 2020-03-13 ENCOUNTER — Other Ambulatory Visit: Payer: Self-pay

## 2020-03-13 ENCOUNTER — Ambulatory Visit: Payer: Medicare HMO | Admitting: Pulmonary Disease

## 2020-03-13 ENCOUNTER — Ambulatory Visit (HOSPITAL_COMMUNITY)
Admission: RE | Admit: 2020-03-13 | Discharge: 2020-03-13 | Disposition: A | Discharge status: Home / Self Care | Payer: Medicare HMO | Source: Ambulatory Visit | Attending: Oncology | Admitting: Oncology

## 2020-03-13 DIAGNOSIS — Z87891 Personal history of nicotine dependence: Secondary | ICD-10-CM | POA: Insufficient documentation

## 2020-03-13 DIAGNOSIS — J432 Centrilobular emphysema: Secondary | ICD-10-CM | POA: Diagnosis not present

## 2020-03-13 DIAGNOSIS — Z122 Encounter for screening for malignant neoplasm of respiratory organs: Secondary | ICD-10-CM | POA: Diagnosis not present

## 2020-03-13 DIAGNOSIS — I700 Atherosclerosis of aorta: Secondary | ICD-10-CM | POA: Diagnosis not present

## 2020-03-13 DIAGNOSIS — J984 Other disorders of lung: Secondary | ICD-10-CM | POA: Diagnosis not present

## 2020-03-13 DIAGNOSIS — I2510 Atherosclerotic heart disease of native coronary artery without angina pectoris: Secondary | ICD-10-CM | POA: Diagnosis not present

## 2020-03-16 ENCOUNTER — Encounter (HOSPITAL_COMMUNITY): Payer: Self-pay

## 2020-03-16 ENCOUNTER — Telehealth: Payer: Self-pay | Admitting: *Deleted

## 2020-03-16 NOTE — Progress Notes (Signed)
Patient notified of LDCT Lung Cancer Screening Results via telephone with the recommendation to follow-up in 3 months. Patient's referring provider has been sent a copy of results. Results are as follows:  IMPRESSION: 1. Lung-RADS 4A, suspicious. Follow up low-dose chest CT without contrast in 3 months (please use the following order, "CT CHEST LCS NODULE FOLLOW-UP W/O CM") is recommended. Waxing and waning of pulmonary nodules bilaterally, likely infectious/inflammatory etiology. New posterior pulmonary nodules on today's study measure up to almost 9 mm. Given the waxing and waning of these nodules, presumably after therapy, this patient may ultimately not be a good candidate for low-dose CT lung cancer screening. 2. Enlargement of the main pulmonary outflow tract and main pulmonary arteries suggests pulmonary arterial hypertension. 3. Aortic Atherosclerosis (ICD10-I70.0) and Emphysema (ICD10-J43.9).

## 2020-03-16 NOTE — Telephone Encounter (Signed)
Called report  IMPRESSION: 1. Lung-RADS 4A, suspicious. Follow up low-dose chest CT without contrast in 3 months (please use the following order, "CT CHEST LCS NODULE FOLLOW-UP W/O CM") is recommended. Waxing and waning of pulmonary nodules bilaterally, likely infectious/inflammatory etiology. New posterior pulmonary nodules on today's study measure up to almost 9 mm. Given the waxing and waning of these nodules, presumably after therapy, this patient may ultimately not be a good candidate for low-dose CT lung cancer screening. 2. Enlargement of the main pulmonary outflow tract and main pulmonary arteries suggests pulmonary arterial hypertension. 3. Aortic Atherosclerosis (ICD10-I70.0) and Emphysema (ICD10-J43.9).  These results will be called to the ordering clinician or representative by the Radiologist Assistant, and communication documented in the PACS or [employer].   Electronically Signed   By: Misty Stanley M.D.   On: 03/16/2020 08:15

## 2020-03-17 NOTE — Progress Notes (Signed)
[place], can you forward this to morgan? I can't find her name in the system.   Faythe Casa, NP 03/17/2020 8:44 AM

## 2020-03-18 ENCOUNTER — Telehealth: Payer: Self-pay | Admitting: Pulmonary Disease

## 2020-03-18 NOTE — Telephone Encounter (Signed)
Spoke with patient's mother. She is aware of Cameron Villarreal's recs. She was not happy with the response. She stated that every time he goes to the docs office, his O2 reads in the lower 90s. When he is at home, the pulse ox reads in the 70s. She has changed the batteries several times and will still get the same reading.   She also spoke with the respiratory rep at [facility] and she told her the same thing about the mask, that it would not be a good idea for him to get the mask. She also stated that even if we were to send the order, they would refuse the mask order.   She wanted someone else to give their opinion. I advised her that since his O2 drops to the 70s at home on 2-3L, he needs to go to nearest hospital. She refused.   RA, you have seen the patient before in the past. Can you please advise since VS is not available?

## 2020-03-18 NOTE — Telephone Encounter (Signed)
Called and spoke with pts mother and she is requesting that the pt get a mask to use with his oxygen.   She stated that he has the nasal cannula and this does not seem to be giving him enough oxygen.  She stated that he is normally on 2 liters but she has turned it up to 3 liters and she feels that a mask would help him better.  I advised her that with a lower liter flow like he is on that he may not be able to wear a mask, but I would check to make sure.  VS is out of the office. JD  Please advise. Thanks

## 2020-03-18 NOTE — Telephone Encounter (Signed)
He has severe COPD and emphysema. His goal oxygen saturation should be around 90-92%. He does not need to be higher than 92%. At a 2-3L O2 flow rate, a nasal canula will do just fine. He does not need an oxygen facemask.   Thanks, Wille Glaser

## 2020-03-18 NOTE — Telephone Encounter (Signed)
No new recommendations from me

## 2020-03-19 NOTE — Telephone Encounter (Signed)
Call made to patient mother Cameron Villarreal, confirmed patient DOB. I made her aware Dr. Elsworth Soho had no new recommendations at this time. She reports she spoke with cone yesterday and they got it straight. She then asked if there was anything we could do regarding the pressure in his chest. I inquired as to whether he was having active chest pain. She denies. She reports he is taking several aspirins to ease the pressure in his chest. I inquired as to whether his oxygen was still dropping. She reports it continue to fluctuate. I again advised he should go to the ED to get a chest xray and possibly an EKG. Patient mother Cameron Villarreal advises she has been begging him to go for several days and he will not. I asked if I could speak with him, she advised he is not able to speak complete sentences and would not allow me to speak with patient. I advised is symptoms worsen to call back however, I did advise that he will need to go to ED given the symptoms. Patient mother Cameron Villarreal voiced understanding. Aware to call us if she can convince patient to go to ED. Nothing further needed at this time.

## 2020-04-06 ENCOUNTER — Ambulatory Visit: Payer: Medicare HMO | Admitting: Pulmonary Disease

## 2020-04-08 ENCOUNTER — Inpatient Hospital Stay (HOSPITAL_COMMUNITY)
Admission: EM | Admit: 2020-04-08 | Discharge: 2020-04-14 | DRG: 190 | Disposition: A | Discharge status: Hospice | Payer: Medicare HMO | Attending: Internal Medicine | Admitting: Internal Medicine

## 2020-04-08 ENCOUNTER — Encounter (HOSPITAL_COMMUNITY): Payer: Self-pay

## 2020-04-08 ENCOUNTER — Emergency Department (HOSPITAL_COMMUNITY): Payer: Medicare HMO

## 2020-04-08 ENCOUNTER — Encounter: Payer: Self-pay | Admitting: Pulmonary Disease

## 2020-04-08 ENCOUNTER — Other Ambulatory Visit: Payer: Self-pay

## 2020-04-08 ENCOUNTER — Encounter (HOSPITAL_COMMUNITY): Payer: Self-pay | Admitting: Internal Medicine

## 2020-04-08 ENCOUNTER — Ambulatory Visit: Payer: Medicare HMO | Admitting: Pulmonary Disease

## 2020-04-08 VITALS — BP 128/86 | HR 112 | Temp 97.3°F | Ht 68.0 in

## 2020-04-08 DIAGNOSIS — J9620 Acute and chronic respiratory failure, unspecified whether with hypoxia or hypercapnia: Secondary | ICD-10-CM | POA: Diagnosis present

## 2020-04-08 DIAGNOSIS — Z7189 Other specified counseling: Secondary | ICD-10-CM

## 2020-04-08 DIAGNOSIS — E440 Moderate protein-calorie malnutrition: Secondary | ICD-10-CM | POA: Diagnosis not present

## 2020-04-08 DIAGNOSIS — J9621 Acute and chronic respiratory failure with hypoxia: Secondary | ICD-10-CM | POA: Diagnosis present

## 2020-04-08 DIAGNOSIS — J441 Chronic obstructive pulmonary disease with (acute) exacerbation: Secondary | ICD-10-CM

## 2020-04-08 DIAGNOSIS — J449 Chronic obstructive pulmonary disease, unspecified: Secondary | ICD-10-CM | POA: Diagnosis not present

## 2020-04-08 DIAGNOSIS — G894 Chronic pain syndrome: Secondary | ICD-10-CM | POA: Diagnosis not present

## 2020-04-08 DIAGNOSIS — J471 Bronchiectasis with (acute) exacerbation: Principal | ICD-10-CM | POA: Diagnosis present

## 2020-04-08 DIAGNOSIS — E861 Hypovolemia: Secondary | ICD-10-CM | POA: Diagnosis present

## 2020-04-08 DIAGNOSIS — R000 Tachycardia, unspecified: Secondary | ICD-10-CM | POA: Diagnosis not present

## 2020-04-08 DIAGNOSIS — R0602 Shortness of breath: Secondary | ICD-10-CM | POA: Diagnosis not present

## 2020-04-08 DIAGNOSIS — K219 Gastro-esophageal reflux disease without esophagitis: Secondary | ICD-10-CM | POA: Diagnosis present

## 2020-04-08 DIAGNOSIS — Z7951 Long term (current) use of inhaled steroids: Secondary | ICD-10-CM

## 2020-04-08 DIAGNOSIS — F411 Generalized anxiety disorder: Secondary | ICD-10-CM | POA: Diagnosis present

## 2020-04-08 DIAGNOSIS — Z7952 Long term (current) use of systemic steroids: Secondary | ICD-10-CM

## 2020-04-08 DIAGNOSIS — Z515 Encounter for palliative care: Secondary | ICD-10-CM

## 2020-04-08 DIAGNOSIS — R64 Cachexia: Secondary | ICD-10-CM | POA: Diagnosis present

## 2020-04-08 DIAGNOSIS — Z9981 Dependence on supplemental oxygen: Secondary | ICD-10-CM

## 2020-04-08 DIAGNOSIS — E7800 Pure hypercholesterolemia, unspecified: Secondary | ICD-10-CM | POA: Diagnosis present

## 2020-04-08 DIAGNOSIS — J209 Acute bronchitis, unspecified: Secondary | ICD-10-CM | POA: Diagnosis present

## 2020-04-08 DIAGNOSIS — R69 Illness, unspecified: Secondary | ICD-10-CM | POA: Diagnosis not present

## 2020-04-08 DIAGNOSIS — F17210 Nicotine dependence, cigarettes, uncomplicated: Secondary | ICD-10-CM | POA: Diagnosis present

## 2020-04-08 DIAGNOSIS — J9622 Acute and chronic respiratory failure with hypercapnia: Secondary | ICD-10-CM | POA: Diagnosis present

## 2020-04-08 DIAGNOSIS — Z885 Allergy status to narcotic agent status: Secondary | ICD-10-CM

## 2020-04-08 DIAGNOSIS — Z720 Tobacco use: Secondary | ICD-10-CM | POA: Diagnosis present

## 2020-04-08 DIAGNOSIS — E43 Unspecified severe protein-calorie malnutrition: Secondary | ICD-10-CM | POA: Diagnosis present

## 2020-04-08 DIAGNOSIS — Z79891 Long term (current) use of opiate analgesic: Secondary | ICD-10-CM

## 2020-04-08 DIAGNOSIS — J9600 Acute respiratory failure, unspecified whether with hypoxia or hypercapnia: Secondary | ICD-10-CM | POA: Diagnosis present

## 2020-04-08 DIAGNOSIS — R768 Other specified abnormal immunological findings in serum: Secondary | ICD-10-CM | POA: Diagnosis present

## 2020-04-08 DIAGNOSIS — Z66 Do not resuscitate: Secondary | ICD-10-CM | POA: Diagnosis present

## 2020-04-08 DIAGNOSIS — Z20822 Contact with and (suspected) exposure to covid-19: Secondary | ICD-10-CM | POA: Diagnosis present

## 2020-04-08 DIAGNOSIS — Z681 Body mass index (BMI) 19 or less, adult: Secondary | ICD-10-CM

## 2020-04-08 DIAGNOSIS — J470 Bronchiectasis with acute lower respiratory infection: Secondary | ICD-10-CM | POA: Diagnosis present

## 2020-04-08 DIAGNOSIS — Z79899 Other long term (current) drug therapy: Secondary | ICD-10-CM

## 2020-04-08 LAB — CBC WITH DIFFERENTIAL/PLATELET
Abs Immature Granulocytes: 0.17 10*3/uL — ABNORMAL HIGH (ref 0.00–0.07)
Basophils Absolute: 0.2 10*3/uL — ABNORMAL HIGH (ref 0.0–0.1)
Basophils Relative: 1 %
Eosinophils Absolute: 0.2 10*3/uL (ref 0.0–0.5)
Eosinophils Relative: 1 %
HCT: 55 % — ABNORMAL HIGH (ref 39.0–52.0)
Hemoglobin: 16.7 g/dL (ref 13.0–17.0)
Immature Granulocytes: 1 %
Lymphocytes Relative: 9 %
Lymphs Abs: 1.8 10*3/uL (ref 0.7–4.0)
MCH: 28.8 pg (ref 26.0–34.0)
MCHC: 30.4 g/dL (ref 30.0–36.0)
MCV: 94.8 fL (ref 80.0–100.0)
Monocytes Absolute: 1.8 10*3/uL — ABNORMAL HIGH (ref 0.1–1.0)
Monocytes Relative: 9 %
Neutro Abs: 16.7 10*3/uL — ABNORMAL HIGH (ref 1.7–7.7)
Neutrophils Relative %: 79 %
Platelets: 505 10*3/uL — ABNORMAL HIGH (ref 150–400)
RBC: 5.8 MIL/uL (ref 4.22–5.81)
RDW: 14.5 % (ref 11.5–15.5)
WBC: 20.8 10*3/uL — ABNORMAL HIGH (ref 4.0–10.5)
nRBC: 0 % (ref 0.0–0.2)

## 2020-04-08 LAB — COMPREHENSIVE METABOLIC PANEL
ALT: 15 U/L (ref 0–44)
AST: 20 U/L (ref 15–41)
Albumin: 4 g/dL (ref 3.5–5.0)
Alkaline Phosphatase: 94 U/L (ref 38–126)
Anion gap: 11 (ref 5–15)
BUN: 8 mg/dL (ref 6–20)
CO2: 29 mmol/L (ref 22–32)
Calcium: 9.3 mg/dL (ref 8.9–10.3)
Chloride: 93 mmol/L — ABNORMAL LOW (ref 98–111)
Creatinine, Ser: 0.49 mg/dL — ABNORMAL LOW (ref 0.61–1.24)
GFR, Estimated: >60 mL/min (ref >60)
Glucose, Bld: 120 mg/dL — ABNORMAL HIGH (ref 70–99)
Potassium: 4.6 mmol/L (ref 3.5–5.1)
Sodium: 133 mmol/L — ABNORMAL LOW (ref 135–145)
Total Bilirubin: 0.5 mg/dL (ref 0.3–1.2)
Total Protein: 8.2 g/dL — ABNORMAL HIGH (ref 6.5–8.1)

## 2020-04-08 LAB — RESP PANEL BY RT-PCR (FLU A&B, COVID) ARPGX2
Influenza A by PCR: NEGATIVE
Influenza B by PCR: NEGATIVE
SARS Coronavirus 2 by RT PCR: NEGATIVE

## 2020-04-08 LAB — TROPONIN I (HIGH SENSITIVITY)
Troponin I (High Sensitivity): 5 ng/L (ref <18)
Troponin I (High Sensitivity): 5 ng/L (ref <18)

## 2020-04-08 LAB — HIV ANTIBODY (ROUTINE TESTING W REFLEX): HIV Screen 4th Generation wRfx: NONREACTIVE

## 2020-04-08 MED ORDER — ALBUTEROL SULFATE (2.5 MG/3ML) 0.083% IN NEBU
2.5000 mg | INHALATION_SOLUTION | RESPIRATORY_TRACT | Status: DC | PRN
  Administered 2020-04-09 – 2020-04-14 (×12): 2.5 mg via RESPIRATORY_TRACT
  Filled 2020-04-08 (×13): qty 3

## 2020-04-08 MED ORDER — IPRATROPIUM-ALBUTEROL 0.5-2.5 (3) MG/3ML IN SOLN
3.0000 mL | Freq: Four times a day (QID) | RESPIRATORY_TRACT | Status: DC
  Administered 2020-04-08 (×2): 3 mL via RESPIRATORY_TRACT
  Filled 2020-04-08 (×2): qty 3

## 2020-04-08 MED ORDER — METHYLPREDNISOLONE SODIUM SUCC 125 MG IJ SOLR
125.0000 mg | Freq: Once | INTRAMUSCULAR | Status: AC
  Administered 2020-04-08: 125 mg via INTRAVENOUS
  Filled 2020-04-08: qty 2

## 2020-04-08 MED ORDER — MORPHINE SULFATE (PF) 4 MG/ML IV SOLN
4.0000 mg | Freq: Once | INTRAVENOUS | Status: AC
  Administered 2020-04-08: 4 mg via INTRAVENOUS
  Filled 2020-04-08: qty 1

## 2020-04-08 MED ORDER — AZITHROMYCIN 250 MG PO TABS
500.0000 mg | ORAL_TABLET | Freq: Every day | ORAL | Status: AC
  Administered 2020-04-09 – 2020-04-12 (×4): 500 mg via ORAL
  Filled 2020-04-08 (×4): qty 2

## 2020-04-08 MED ORDER — PREDNISONE 20 MG PO TABS
40.0000 mg | ORAL_TABLET | Freq: Every day | ORAL | Status: DC
  Administered 2020-04-10: 40 mg via ORAL
  Filled 2020-04-08: qty 2

## 2020-04-08 MED ORDER — ACETAMINOPHEN 325 MG PO TABS
650.0000 mg | ORAL_TABLET | Freq: Four times a day (QID) | ORAL | Status: DC
  Administered 2020-04-08 – 2020-04-14 (×21): 650 mg via ORAL
  Filled 2020-04-08 (×23): qty 2

## 2020-04-08 MED ORDER — SODIUM CHLORIDE 0.9 % IV SOLN
500.0000 mg | Freq: Once | INTRAVENOUS | Status: AC
  Administered 2020-04-08: 500 mg via INTRAVENOUS
  Filled 2020-04-08: qty 500

## 2020-04-08 MED ORDER — TRAZODONE HCL 50 MG PO TABS
50.0000 mg | ORAL_TABLET | Freq: Every day | ORAL | Status: DC
Start: 2020-04-08 — End: 2020-04-14
  Administered 2020-04-09 – 2020-04-13 (×5): 50 mg via ORAL
  Filled 2020-04-08 (×5): qty 1

## 2020-04-08 MED ORDER — SODIUM CHLORIDE 0.9 % IV SOLN
1.0000 g | INTRAVENOUS | Status: AC
  Administered 2020-04-09 – 2020-04-14 (×6): 1 g via INTRAVENOUS
  Filled 2020-04-08 (×6): qty 10

## 2020-04-08 MED ORDER — SODIUM CHLORIDE 0.9 % IV SOLN
1.0000 g | Freq: Once | INTRAVENOUS | Status: AC
  Administered 2020-04-08: 1 g via INTRAVENOUS
  Filled 2020-04-08: qty 10

## 2020-04-08 MED ORDER — BUDESON-GLYCOPYRROL-FORMOTEROL 160-9-4.8 MCG/ACT IN AERO: 2.0000 | INHALATION_SPRAY | RESPIRATORY_TRACT | Status: DC

## 2020-04-08 MED ORDER — NICOTINE 14 MG/24HR TD PT24
14.0000 mg | MEDICATED_PATCH | Freq: Every day | TRANSDERMAL | Status: DC
  Administered 2020-04-08 – 2020-04-14 (×7): 14 mg via TRANSDERMAL
  Filled 2020-04-08 (×7): qty 1

## 2020-04-08 MED ORDER — ENOXAPARIN SODIUM 40 MG/0.4ML ~~LOC~~ SOLN
40.0000 mg | SUBCUTANEOUS | Status: DC
  Administered 2020-04-08 – 2020-04-14 (×7): 40 mg via SUBCUTANEOUS
  Filled 2020-04-08 (×7): qty 0.4

## 2020-04-08 MED ORDER — DEXTROSE-NACL 5-0.45 % IV SOLN: INTRAVENOUS | Status: DC

## 2020-04-08 MED ORDER — FLUTICASONE FUROATE-VILANTEROL 100-25 MCG/INH IN AEPB: 1.0000 | INHALATION_SPRAY | Freq: Every day | RESPIRATORY_TRACT | Status: DC

## 2020-04-08 MED ORDER — CYCLOBENZAPRINE HCL 10 MG PO TABS
10.0000 mg | ORAL_TABLET | Freq: Two times a day (BID) | ORAL | Status: DC
  Administered 2020-04-08 – 2020-04-10 (×4): 10 mg via ORAL
  Filled 2020-04-08 (×4): qty 1

## 2020-04-08 MED ORDER — SODIUM CHLORIDE 0.9 % IV BOLUS
500.0000 mL | Freq: Once | INTRAVENOUS | Status: AC
  Administered 2020-04-08: 500 mL via INTRAVENOUS

## 2020-04-08 MED ORDER — METHYLPREDNISOLONE SODIUM SUCC 40 MG IJ SOLR
40.0000 mg | Freq: Four times a day (QID) | INTRAMUSCULAR | Status: AC
  Administered 2020-04-08 – 2020-04-09 (×3): 40 mg via INTRAVENOUS
  Filled 2020-04-08 (×3): qty 1

## 2020-04-08 MED ORDER — METHYLPREDNISOLONE SODIUM SUCC 125 MG IJ SOLR: 60.0000 mg | Freq: Four times a day (QID) | INTRAMUSCULAR | Status: DC

## 2020-04-08 MED ORDER — ALBUTEROL SULFATE (2.5 MG/3ML) 0.083% IN NEBU: 2.5000 mg | INHALATION_SOLUTION | RESPIRATORY_TRACT | Status: DC | PRN

## 2020-04-08 MED ORDER — GABAPENTIN 300 MG PO CAPS
300.0000 mg | ORAL_CAPSULE | Freq: Three times a day (TID) | ORAL | Status: DC
  Administered 2020-04-08 – 2020-04-14 (×18): 300 mg via ORAL
  Filled 2020-04-08 (×18): qty 1

## 2020-04-08 MED ORDER — UMECLIDINIUM BROMIDE 62.5 MCG/INH IN AEPB: 1.0000 | INHALATION_SPRAY | Freq: Every day | RESPIRATORY_TRACT | Status: DC

## 2020-04-08 MED ORDER — GUAIFENESIN ER 600 MG PO TB12
1200.0000 mg | ORAL_TABLET | Freq: Two times a day (BID) | ORAL | Status: DC
  Administered 2020-04-08: 1200 mg via ORAL
  Filled 2020-04-08: qty 2

## 2020-04-08 MED ORDER — IPRATROPIUM-ALBUTEROL 0.5-2.5 (3) MG/3ML IN SOLN
3.0000 mL | Freq: Three times a day (TID) | RESPIRATORY_TRACT | Status: DC
  Administered 2020-04-09 (×2): 3 mL via RESPIRATORY_TRACT
  Filled 2020-04-08 (×2): qty 3

## 2020-04-08 MED ORDER — ACETAMINOPHEN 500 MG PO TABS
500.0000 mg | ORAL_TABLET | Freq: Four times a day (QID) | ORAL | Status: DC | PRN
  Administered 2020-04-09 – 2020-04-10 (×2): 1000 mg via ORAL
  Administered 2020-04-14: 650 mg via ORAL
  Filled 2020-04-08 (×2): qty 2

## 2020-04-08 MED ORDER — ALBUTEROL SULFATE HFA 108 (90 BASE) MCG/ACT IN AERS: 2.0000 | INHALATION_SPRAY | RESPIRATORY_TRACT | Status: DC | PRN

## 2020-04-08 MED ORDER — ALBUTEROL SULFATE HFA 108 (90 BASE) MCG/ACT IN AERS
8.0000 | INHALATION_SPRAY | RESPIRATORY_TRACT | Status: DC | PRN
  Administered 2020-04-08: 8 via RESPIRATORY_TRACT
  Filled 2020-04-08: qty 6.7

## 2020-04-08 MED ORDER — MAGNESIUM SULFATE 2 GM/50ML IV SOLN
2.0000 g | Freq: Once | INTRAVENOUS | Status: AC
  Administered 2020-04-08: 2 g via INTRAVENOUS
  Filled 2020-04-08: qty 50

## 2020-04-08 MED ORDER — CELECOXIB 100 MG PO CAPS
200.0000 mg | ORAL_CAPSULE | Freq: Two times a day (BID) | ORAL | Status: DC
  Administered 2020-04-08 – 2020-04-14 (×12): 200 mg via ORAL
  Filled 2020-04-08 (×13): qty 2

## 2020-04-08 NOTE — Progress Notes (Signed)
[facility]  Chief Complaint  Patient presents with  . Follow-up    Productive cough with yellow and brown phlegm    Constitutional:  BP 128/86 (BP Location: Left Arm, Cuff Size: Normal)   Pulse (!) 112   Temp (!) 97.3 F (36.3 C) (Other (Comment)) Comment (Src): wrist  Ht 5\' 8"  (1.727 m)   SpO2 95% Comment: 2L O2  BMI 19.16 kg/m   Past Medical History:  Anxiety, GERD, HLD, Insomnia, Back pain, Pneumonia, ANA positive  Past Surgical History:  His  has a past surgical history that includes Cholecystectomy; Appendectomy; Right inguinal hernia repair; Hernia repair; Knee surgery (Right); Colonoscopy; ORIF humerus fracture (Right, 11/17/2017); and Open reduction internal fixation (orif) distal radial fracture (Right, 11/17/2017).  Brief Summary:  Cameron Villarreal is a 55 y.o. male smoker with COPD/emphysema and chronic hypoxic/hypercapnic respiratory failure.  He had positive ANA and DS DNA Ab from March 2020.      Subjective:   He is here with his mother.  Having more cough and sputum,  Bringing up brown phlegm.  Doesn't feel like he can get enough air into his lungs.  Feels tight in his chest.  Needing increased amounts of oxygen at home.  Ran out of prednisone - wasn't sure he was supposed to stay on prednisone.  Not awrare of fever.  Denies hemoptysis.  Not much appetite.  Feeling weaker.  He was too weak to go to appointment for PFT.  His mom is asking whether he needs a hospital bed and a wheelchair at home.  Physical Exam:   Appearance - cachectic, wearing oxygen, using accessory muscles  ENMT - no sinus tenderness, no oral exudate, no LAN, Mallampati 2 airway, no stridor, edentulous, raspy voice  Respiratory - decreased BS, poor air movement  CV - s1s2 regular, tachycardic, no murmurs  Ext - no clubbing, no edema  Skin - no rashes  Psych - anxious   Pulmonary testing:    Chest Imaging:   LDCT chest 06/11/19 >>  extensive centrilobular and paraseptal emphysema, lower lobe predominant BTX, RLL 11.4 mm nodule  LDCT 03/16/20 >> numerous b/l nodules with tree in bud, new 8.8 mm nodule RLL, new 7.9 mm nodule LLL   Cardiac Tests:   Echo 01/28/17 >> EF 50 to 55%, grade 1 DD, mod RV systolic dysfx  Social History:  He  reports that he has been smoking cigarettes. He has a 7.50 pack-year smoking history. He has never used smokeless tobacco. He reports that he does not drink alcohol and does not use drugs.  Family History:  His family history includes Alcoholism in his father; Cirrhosis in his father; Depression in his brother; Stomach cancer in an other family member.     Assessment/Plan:   Severe COPD with chronic bronchitis, emphysema, and bronchiectasis. - he has advanced disease and has an exacerbation - recent CT chest also concerning for atypical infection - he will go to [facility] for hospital admission - will need to start on solumedrol and antibiotics - will need to arrange for sputum culture and sputum for AFB to assess for MAI - bronchial hygiene - check A1AT level  Acute on chronic respiratory failure with hypoxia and hypercapnia. - uses [place] for his DME - might need Bipap in hospital  Lung nodules.  - will need f/u CT chest in May 2022  History of ANA and anti DS DNA positive from March 6195. - uncertain whether this  is playing any role at present with his respiratory status - might need to repeat ANA  Cachexia from COPD. - will need nutrition assessment in hospital  Tobacco abuse. - nicotine patch while in hospital  Goals of care. - will need to discuss palliative care assessment while he is in hospital  Time Spent Involved in Patient Care on Day of Examination:  47 minutes  Follow up:  Patient Instructions  Go to the ER at [facility] for admission   Medication List:   Allergies as of 04/08/2020      Reactions   Codeine Itching, Nausea Only    Hydrocodone Itching, Swelling, Other (See Comments)   "Makes it difficult to swallow," but no breathing impairment noted Patient state he can take Oxycodone took it for his arm fracture recently.      Medication List       Accurate as of April 08, 2020  1:52 PM. If you have any questions, ask your nurse or doctor.        acetaminophen 500 MG tablet Commonly known as: TYLENOL Take 500-1,000 mg by mouth every 6 (six) hours as needed (for pain or headaches).   albuterol 108 (90 Base) MCG/ACT inhaler Commonly known as: VENTOLIN HFA Inhale 2 puffs into the lungs 4 (four) times daily as needed.   albuterol (2.5 MG/3ML) 0.083% nebulizer solution Commonly known as: PROVENTIL Take 3 mLs (2.5 mg total) by nebulization every 4 (four) hours as needed for wheezing or shortness of breath.   Breztri Aerosphere 160-9-4.8 MCG/ACT Aero Generic drug: Budeson-Glycopyrrol-Formoterol Inhale 2 puffs into the lungs in the morning and at bedtime.   cyclobenzaprine 10 MG tablet Commonly known as: FLEXERIL Take 1 tablet (10 mg total) by mouth 2 (two) times daily.   gabapentin 300 MG capsule Commonly known as: NEURONTIN Take 1 capsule (300 mg total) by mouth 3 (three) times daily.   nicotine 14 mg/24hr patch Commonly known as: NICODERM CQ - dosed in mg/24 hours Place 1 patch (14 mg total) onto the skin daily.   sodium chloride HYPERTONIC 3 % nebulizer solution Take by nebulization as needed for other.   traZODone 50 MG tablet Commonly known as: DESYREL Take 1 tablet (50 mg total) by mouth at bedtime.       Signature:  Chesley Mires, MD [place] Pager - [phone] 04/08/2020, 1:52 PM

## 2020-04-08 NOTE — ED Provider Notes (Signed)
[facility] EMERGENCY DEPARTMENT Provider Note   CSN: [id number] Arrival date & time: 04/08/20  1319     History Chief Complaint  Patient presents with  . Shortness of Breath    Cameron Villarreal is a 55 y.o. male.  He is here for worsening shortness of breath.  He has significant COPD and is on 2 L nasal cannula 24/7.  Continues to smoke.  Increased shortness of breath for a while but worse over the past few days.  Saw pulmonology today Dr. Halford Chessman who recommended he come to the emergency department for admission.  Complaining of chest pain with breathing and coughing and also some low abdominal pain that he associates with a hernia.  Cough productive of some yellow-brown sputum.  No known fevers.  Has been COVID vaccinated but not boosted.  The history is provided by the patient.  Shortness of Breath Severity:  Severe Onset quality:  Gradual Timing:  Constant Progression:  Worsening Chronicity:  Recurrent Context: activity   Relieved by:  Nothing Worsened by:  Activity and coughing Ineffective treatments:  Oxygen, rest and inhaler Associated symptoms: abdominal pain, chest pain, cough and sputum production   Associated symptoms: no fever, no headaches, no hemoptysis, no rash, no sore throat and no vomiting   Risk factors: tobacco use        Past Medical History:  Diagnosis Date  . Anxiety   . Anxiety disorder, unspecified   . Back pain   . Chest pain at rest 07/26/2011  . Chronic obstructive pulmonary disease, unspecified ([place])   . COPD (chronic obstructive pulmonary disease) (HCC)    Severe centrilobular emphysema, on home o2  . Cough   . Dyspnea   . GERD (gastroesophageal reflux disease)   . Hypercholesterolemia   . Insomnia   . Long term current use of opiate analgesic 04/12/2018  . Low back pain   . Nicotine dependence, unspecified, uncomplicated   . Other specified disorders of teeth and supporting structures   . Pneumonia   . Pulmonary nodule 07/04/2011  . RECTAL  BLEEDING 04/21/2010   Qualifier: Diagnosis of  By: Algernon Huxley NP, Vicente Males    . S/P colonoscopy March 2012   Normal  . S/P endoscopy March 2012   Reflux esophagitis, no ulcerations  . Shortness of breath   . Unilateral inguinal hernia, without obstruction or gangrene, not specified as recurrent   . Unspecified protein-calorie malnutrition [facility])     Patient Active Problem List   Diagnosis Date Noted  . Positive ANA (antinuclear antibody) 02/20/2020  . Bronchiectasis with acute exacerbation ([place]) 10/07/2019  . Chronic obstructive pulmonary disease, unspecified ([place])   . Low back pain   . Anxiety disorder, unspecified   . Insomnia   . Other specified disorders of teeth and supporting structures   . Unilateral inguinal hernia, without obstruction or gangrene, not specified as recurrent   . Vitamin D insufficiency 04/16/2018  . Elevated sed rate 04/16/2018  . Elevated C-reactive protein (CRP) 04/16/2018  . Chronic elbow pain, right (Primary Area of Pain) 04/12/2018  . Wrist pain, chronic, right (Secondary Area of Pain) 04/12/2018  . Chronic pain syndrome 04/12/2018  . Chronic pain of right upper extremity (Tertiary Area of Pain) 04/12/2018  . Encounter to establish care 04/12/2018  . Disorder of skeletal system 04/12/2018  . Disp fx of right radial styloid process, init for clos fx 12/14/2017  . Fracture of right ulnar styloid 12/14/2017  . Closed fracture of right distal humerus 11/16/2017  .  Drug abuse ([place]) 01/30/2017  . GERD (gastroesophageal reflux disease) 01/26/2017  . Hypercholesterolemia 01/26/2017  . Elevated brain natriuretic peptide (BNP) level 01/26/2017  . Elevated troponin 01/26/2017  . Anxiety 01/26/2017  . Thrombocytosis 01/26/2017  . Acute on chronic respiratory failure ([place]) 02/04/2015  . COPD with acute exacerbation ([place]) 12/25/2014  . Malnutrition of moderate degree ([place]) 07/03/2014  . Polycythemia 07/01/2014  . Anxiety state 06/24/2013  . Chronic back pain greater  than 3 months duration 06/24/2013  . Respiratory failure, acute ([place]) 06/22/2013  . Pulmonary nodule 07/04/2011  . Hyperglycemia, drug-induced 07/04/2011  . Tobacco abuse 07/02/2011    Past Surgical History:  Procedure Laterality Date  . APPENDECTOMY    . CHOLECYSTECTOMY    . COLONOSCOPY    . HERNIA REPAIR    . KNEE SURGERY Right    laceration wired back together  . OPEN REDUCTION INTERNAL FIXATION (ORIF) DISTAL RADIAL FRACTURE Right 11/17/2017   Procedure: OPEN REDUCTION INTERNAL FIXATION (ORIF) DISTAL RADIAL FRACTURE;  Surgeon: Shona Needles, MD;  Location: [place];  Service: Orthopedics;  Laterality: Right;  . ORIF HUMERUS FRACTURE Right 11/17/2017   Procedure: OPEN REDUCTION INTERNAL FIXATION (ORIF) DISTAL HUMERUS FRACTURE;  Surgeon: Shona Needles, MD;  Location: [place];  Service: Orthopedics;  Laterality: Right;  . Right inguinal hernia repair         Family History  Problem Relation Age of Onset  . Cirrhosis Father        Deceased, ETOH cirrhosis  . Alcoholism Father   . Depression Brother   . Stomach cancer Other        Aunt    Social History   Tobacco Use  . Smoking status: Current Every Day Smoker    Packs/day: 0.25    Years: 30.00    Pack years: 7.50    Types: Cigarettes  . Smokeless tobacco: Never Used  . Tobacco comment: smokes 1/2 pack per day 04/08/2020  Vaping Use  . Vaping Use: Never used  Substance Use Topics  . Alcohol use: No    Alcohol/week: 1.0 standard drink    Types: 1 Standard drinks or equivalent per week    Comment: Occasional  . Drug use: No    Home Medications Prior to Admission medications   Medication Sig Start Date End Date Taking? Authorizing Provider  acetaminophen (TYLENOL) 500 MG tablet Take 500-1,000 mg by mouth every 6 (six) hours as needed (for pain or headaches).    [provider]  albuterol (PROVENTIL) (2.5 MG/3ML) 0.083% nebulizer solution Take 3 mLs (2.5 mg total) by nebulization every 4 (four) hours as  needed for wheezing or shortness of breath. 01/17/20   Chesley Mires, MD  albuterol (VENTOLIN HFA) 108 (90 Base) MCG/ACT inhaler Inhale 2 puffs into the lungs 4 (four) times daily as needed. 09/23/19   Chesley Mires, MD  Budeson-Glycopyrrol-Formoterol (BREZTRI AEROSPHERE) 160-9-4.8 MCG/ACT AERO Inhale 2 puffs into the lungs in the morning and at bedtime. 01/17/20   Chesley Mires, MD  cyclobenzaprine (FLEXERIL) 10 MG tablet Take 1 tablet (10 mg total) by mouth 2 (two) times daily. 09/23/19   Chesley Mires, MD  gabapentin (NEURONTIN) 300 MG capsule Take 1 capsule (300 mg total) by mouth 3 (three) times daily. 03/17/20   Lindell Spar, MD  nicotine (NICODERM CQ - DOSED IN MG/24 HOURS) 14 mg/24hr patch Place 1 patch (14 mg total) onto the skin daily. 01/17/20   Chesley Mires, MD  sodium chloride HYPERTONIC 3 % nebulizer solution Take  by nebulization as needed for other. 10/07/19   Rigoberto Noel, MD  traZODone (DESYREL) 50 MG tablet Take 1 tablet (50 mg total) by mouth at bedtime. 02/20/20   Lindell Spar, MD    Allergies    Codeine and Hydrocodone  Review of Systems   Review of Systems  Constitutional: Negative for fever.  HENT: Negative for sore throat.   Eyes: Negative for visual disturbance.  Respiratory: Positive for cough, sputum production and shortness of breath. Negative for hemoptysis.   Cardiovascular: Positive for chest pain.  Gastrointestinal: Positive for abdominal pain. Negative for vomiting.  Genitourinary: Negative for dysuria.  Musculoskeletal: Negative for joint swelling.  Skin: Negative for rash.  Neurological: Negative for headaches.    Physical Exam Updated Vital Signs BP 124/83   Pulse (!) 120   Temp 98 F (36.7 C) (Oral)   Resp (!) 22   Ht 5\' 8"  (1.727 m)   Wt 55.3 kg   SpO2 92%   BMI 18.55 kg/m   Physical Exam Vitals and nursing note reviewed.  Constitutional:      General: He is in acute distress.     Appearance: He is well-nourished. He is cachectic.   HENT:     Head: Normocephalic and atraumatic.  Eyes:     Conjunctiva/sclera: Conjunctivae normal.  Cardiovascular:     Rate and Rhythm: Regular rhythm. Tachycardia present.     Heart sounds: No murmur heard.   Pulmonary:     Effort: Tachypnea and accessory muscle usage present. No respiratory distress.     Breath sounds: Decreased breath sounds present.  Abdominal:     Palpations: Abdomen is soft.     Tenderness: There is no abdominal tenderness.  Musculoskeletal:        General: No edema. Normal range of motion.     Cervical back: Neck supple.     Right lower leg: No tenderness. No edema.     Left lower leg: No tenderness. No edema.  Skin:    General: Skin is warm and dry.     Capillary Refill: Capillary refill takes less than 2 seconds.  Neurological:     General: No focal deficit present.     Mental Status: He is alert.  Psychiatric:        Mood and Affect: Mood and affect normal.     ED Results / Procedures / Treatments   Labs (all labs ordered are listed, but only abnormal results are displayed) Labs Reviewed  CBC WITH DIFFERENTIAL/PLATELET - Abnormal; Notable for the following components:      Result Value   WBC 20.8 (*)    HCT 55.0 (*)    Platelets 505 (*)    Neutro Abs 16.7 (*)    Monocytes Absolute 1.8 (*)    Basophils Absolute 0.2 (*)    Abs Immature Granulocytes 0.17 (*)    All other components within normal limits  COMPREHENSIVE METABOLIC PANEL - Abnormal; Notable for the following components:   Sodium 133 (*)    Chloride 93 (*)    Glucose, Bld 120 (*)    Creatinine, Ser 0.49 (*)    Total Protein 8.2 (*)    All other components within normal limits  RESP PANEL BY RT-PCR (FLU A&B, COVID) ARPGX2  EXPECTORATED SPUTUM ASSESSMENT W GRAM STAIN, RFLX TO RESP C  ACID FAST SMEAR (AFB, MYCOBACTERIA)  ACID FAST CULTURE WITH REFLEXED SENSITIVITIES (MYCOBACTERIA)  EXPECTORATED SPUTUM ASSESSMENT W GRAM STAIN, RFLX TO RESP C  HIV ANTIBODY (  ROUTINE TESTING W  REFLEX)  ANTI-JO 1 ANTIBODY, IGG  EXTRACTABLE NUCLEAR ANTIGEN ANTIBODY  CBC WITH DIFFERENTIAL/PLATELET  BASIC METABOLIC PANEL  TROPONIN I (HIGH SENSITIVITY)  TROPONIN I (HIGH SENSITIVITY)    EKG EKG Interpretation  Date/Time:  Wednesday April 08 2020 13:37:25 EST Ventricular Rate:  111 PR Interval:    QRS Duration: 90 QT Interval:  339 QTC Calculation: 461 R Axis:   98 Text Interpretation: Sinus tachycardia Biatrial enlargement Borderline right axis deviation Nonspecific T abnrm, anterolateral leads Poor data quality Confirmed by Aletta Edouard [phone]) on 04/08/2020 1:47:07 PM   Radiology DG Chest Port 1 View  Result Date: 04/08/2020 CLINICAL DATA:  Shortness of breath, increasing shortness of breath. EXAM: PORTABLE CHEST 1 VIEW COMPARISON:  Chest x-rays dated 09/18/2019 in 11/24/2017. FINDINGS: Heart size and mediastinal contours appear stable. Lungs are hyperexpanded. Coarse lung markings are again seen bilaterally, not appreciably changed. Chronic bronchitic changes noted centrally. Presumed emphysematous blebs at the lung apices, RIGHT greater than LEFT. No pleural effusion or pneumothorax is seen. No acute appearing osseous abnormality. IMPRESSION: 1. No active disease. No evidence of pneumonia or pulmonary edema. 2. Hyperexpanded lungs indicating COPD. Associated chronic bronchitic changes and probable chronic interstitial lung disease. Electronically Signed   By: Franki Cabot M.D.   On: 04/08/2020 14:18    Procedures Procedures   Medications Ordered in ED Medications  albuterol (VENTOLIN HFA) 108 (90 Base) MCG/ACT inhaler 8 puff (has no administration in time range)  magnesium sulfate IVPB 2 g 50 mL (has no administration in time range)  methylPREDNISolone sodium succinate (SOLU-MEDROL) 125 mg/2 mL injection 125 mg (has no administration in time range)  morphine 4 MG/ML injection 4 mg (has no administration in time range)  sodium chloride 0.9 % bolus 500 mL (has no  administration in time range)    ED Course  I have reviewed the triage vital signs and the nursing notes.  Pertinent labs & imaging results that were available during my care of the patient were reviewed by me and considered in my medical decision making (see chart for details).  Clinical Course as of 04/08/20 1736  Wed Apr 08, 2020  1407 Chest x-ray showing COPD changes along with some platelike atelectasis.  Awaiting radiology reading. [MB]  1506 Discussed with Dr. Linda Hedges from [facility] hospitalist who will evaluate the patient for admission.  Asks if I can start the patient on pulmonary antibiotic coverage. [MB]    Clinical Course User Index [MB] Hayden Rasmussen, MD   MDM Rules/Calculators/A&P                         BURHANUDDIN KOHLMANN was evaluated in Emergency Department on 04/08/2020 for the symptoms described in the history of present illness. He was evaluated in the context of the global COVID-19 pandemic, which necessitated consideration that the patient might be at risk for infection with the SARS-CoV-2 virus that causes COVID-19. Institutional protocols and algorithms that pertain to the evaluation of patients at risk for COVID-19 are in a state of rapid change based on information released by regulatory bodies including the CDC and federal and state organizations. These policies and algorithms were followed during the patient's care in the ED.  This patient complains of increased work of breathing, chest pain abdominal pain more chronic in nature cough productive of some yellow-brown sputum; this involves an extensive number of treatment Options and is a complaint that carries with it a high risk of  complications and Morbidity. The differential includes, Covid, COPD exacerbation, ACS, pneumonia, pneumothorax, PE  I ordered, reviewed and interpreted labs, which included CBC with elevated white count, stable hemoglobin, chemistries fairly normal other than elevated glucose, troponin  unremarkable, Covid testing negative I ordered medication IV pain medication, IV magnesium, IV steroids, inhalation MDI I ordered imaging studies which included chest x-ray and I independently    visualized and interpreted imaging which showed COPD Additional history obtained from Dr. Halford Chessman pulmonary Previous records obtained and reviewed in epic I consulted Dr. Linda Hedges [facility] hospitalist and discussed lab and imaging findings  Critical Interventions: None  After the interventions stated above, I reevaluated the patient and found patient's work of breathing and tachycardia to be improved.  He was able to be titrated back down to 2 L nasal cannula.  He is agreeable to admission for further management.   Final Clinical Impression(s) / ED Diagnoses Final diagnoses:  COPD exacerbation [facility])    Rx / DC Orders ED Discharge Orders    None       Hayden Rasmussen, MD 04/08/20 [phone]

## 2020-04-08 NOTE — H&P (Signed)
History and Physical    Cameron Villarreal DOB: October 06, 1965 DOA: 04/08/2020  PCP: Lindell Spar, MD (Confirm with patient/family/NH records and if not entered, this has to be entered at [facility] point of entry) Patient coming from: Pulmonary office/home  I have personally briefly reviewed patient's old medical records in [place]  Chief Complaint: acute on chronic respiratory failure  HPI: Cameron Villarreal is a 55 y.o. male with medical history significant of advanced COPD - emphysema, chronic bronchitis, bronchiectasis on home oxygen; GERD, Anxiety, chronic pain with skeletal abnormality and protein-calorie malnutrition. He presented to pulmonary clinic, Dr. Halford Chessman, today having more cough and sputum,  Bringing up brown phlegm.  Doesn't feel like he can get enough air into his lungs.  Feels tight in his chest.  Needing increased amounts of oxygen at home.  Ran out of prednisone - wasn't sure he was supposed to stay on prednisone.  Not awrare of fever.  Denies hemoptysis.  Not much appetite.  Feeling weaker. He was instructed to present to [facility] for evaluation and admission.    ED Course: T 98  112/87  HR 103,  RR 17. Initially he was tachypnic and required Bipap support. He appeared to have marked increased WOB. Initial lab revealed blucose 120, WBC 20.8 with 79/9/9, Hgb 16.7. CXR w/o PNA, w/o pulmonary edema. TRH called to admit patient for management of acute on chronic respiratory failure.  Review of Systems: As per HPI otherwise 10 point review of systems negative. Patient does c/o left inquinal hernia which he says is painful but reducible  Past Medical History:  Diagnosis Date  . Anxiety   . Anxiety disorder, unspecified   . Back pain   . Chest pain at rest 07/26/2011  . Chronic obstructive pulmonary disease, unspecified ([place])   . COPD (chronic obstructive pulmonary disease) (HCC)    Severe centrilobular emphysema, on home o2  . Cough   . Dyspnea   . GERD (gastroesophageal  reflux disease)   . Hypercholesterolemia   . Insomnia   . Long term current use of opiate analgesic 04/12/2018  . Low back pain   . Nicotine dependence, unspecified, uncomplicated   . Other specified disorders of teeth and supporting structures   . Pneumonia   . Pulmonary nodule 07/04/2011  . RECTAL BLEEDING 04/21/2010   Qualifier: Diagnosis of  By: Algernon Huxley NP, Vicente Males    . S/P colonoscopy March 2012   Normal  . S/P endoscopy March 2012   Reflux esophagitis, no ulcerations  . Shortness of breath   . Unilateral inguinal hernia, without obstruction or gangrene, not specified as recurrent   . Unspecified protein-calorie malnutrition ([place])     Past Surgical History:  Procedure Laterality Date  . APPENDECTOMY    . CHOLECYSTECTOMY    . COLONOSCOPY    . HERNIA REPAIR    . KNEE SURGERY Right    laceration wired back together  . OPEN REDUCTION INTERNAL FIXATION (ORIF) DISTAL RADIAL FRACTURE Right 11/17/2017   Procedure: OPEN REDUCTION INTERNAL FIXATION (ORIF) DISTAL RADIAL FRACTURE;  Surgeon: Shona Needles, MD;  Location: [facility];  Service: Orthopedics;  Laterality: Right;  . ORIF HUMERUS FRACTURE Right 11/17/2017   Procedure: OPEN REDUCTION INTERNAL FIXATION (ORIF) DISTAL HUMERUS FRACTURE;  Surgeon: Shona Needles, MD;  Location: [place];  Service: Orthopedics;  Laterality: Right;  . 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 Single. Lives with his mother.  reports that he has been smoking cigarettes. He has a 7.50 pack-year smoking history. He has never used smokeless tobacco. He reports that he does not drink alcohol and does not use drugs.  Allergies  Allergen Reactions  . Codeine Itching and Nausea Only  . Hydrocodone Itching, Swelling and Other (See Comments)    "Makes it difficult to swallow," but no breathing impairment noted Patient state he can take Oxycodone took it for his arm fracture recently.    Family History  Problem Relation Age of Onset  .  Cirrhosis Father        Deceased, ETOH cirrhosis  . Alcoholism Father   . Depression Brother   . Stomach cancer Other        Aunt     Prior to Admission medications   Medication Sig Start Date End Date Taking? Authorizing Provider  acetaminophen (TYLENOL) 500 MG tablet Take 500-1,000 mg by mouth every 6 (six) hours as needed (for pain or headaches).   Yes [provider]  albuterol (PROVENTIL) (2.5 MG/3ML) 0.083% nebulizer solution Take 3 mLs (2.5 mg total) by nebulization every 4 (four) hours as needed for wheezing or shortness of breath. 01/17/20  Yes Chesley Mires, MD  albuterol (VENTOLIN HFA) 108 (90 Base) MCG/ACT inhaler Inhale 2 puffs into the lungs 4 (four) times daily as needed. Patient taking differently: Inhale 2 puffs into the lungs 4 (four) times daily as needed for wheezing or shortness of breath. 09/23/19  Yes Chesley Mires, MD  Budeson-Glycopyrrol-Formoterol (BREZTRI AEROSPHERE) 160-9-4.8 MCG/ACT AERO Inhale 2 puffs into the lungs in the morning and at bedtime. 01/17/20  Yes Chesley Mires, MD  cyclobenzaprine (FLEXERIL) 10 MG tablet Take 1 tablet (10 mg total) by mouth 2 (two) times daily. 09/23/19  Yes Chesley Mires, MD  gabapentin (NEURONTIN) 300 MG capsule Take 1 capsule (300 mg total) by mouth 3 (three) times daily. 03/17/20  Yes Lindell Spar, MD  predniSONE (DELTASONE) 5 MG tablet Take 2.5 mg by mouth daily. 01/17/20  Yes [provider]  sodium chloride HYPERTONIC 3 % nebulizer solution Take by nebulization as needed for other. 10/07/19  Yes Rigoberto Noel, MD  traZODone (DESYREL) 50 MG tablet Take 1 tablet (50 mg total) by mouth at bedtime. 02/20/20  Yes Lindell Spar, MD  nicotine (NICODERM CQ - DOSED IN MG/24 HOURS) 14 mg/24hr patch Place 1 patch (14 mg total) onto the skin daily. Patient not taking: Reported on 04/08/2020 01/17/20   Chesley Mires, MD    Physical Exam: Vitals:   04/08/20 1324 04/08/20 1430 04/08/20 1451 04/08/20 1500  BP: 124/83 112/87   (!) 115/99  Pulse: (!) 120 (!) 103  (!) 109  Resp:  17  (!) 22  Temp: 98 F (36.7 C)     TempSrc: Oral     SpO2: 92% 96% 94% 91%  Weight:      Height:         Vitals:   04/08/20 1324 04/08/20 1430 04/08/20 1451 04/08/20 1500  BP: 124/83 112/87  (!) 115/99  Pulse: (!) 120 (!) 103  (!) 109  Resp:  17  (!) 22  Temp: 98 F (36.7 C)     TempSrc: Oral     SpO2: 92% 96% 94% 91%  Weight:      Height:       General:- chronically ill, cachetic appearing man in respiratory distress.  Eyes: sunken, PERRL, lids and conjunctivae normal ENMT: Mucous membranes are moist. Posterior pharynx clear of  any exudate or lesions. Edentulous  Neck: normal, supple, no masses, no thyromegaly Respiratory: Increased WOB using abdominal muscles, has neck retractions, rate of 26. No wheezing, no rales, decreased BS throughout, prolong  Cardiovascular: Regular rate and rhythm, no murmurs / rubs / gallops. No extremity edema. 2+ pedal pulses. No carotid bruits.  Abdomen: no tenderness, no masses palpated. No hepatosplenomegaly. Bowel sounds positive. No palpable hernia left inguinal region. Musculoskeletal: no clubbing / cyanosis. No joint deformity upper and lower extremities. Good ROM, no contractures. Normal muscle tone.  Skin: no rashes, lesions, ulcers. No induration Neurologic: CN 2-12 grossly intact. Sensation intact,  Strength 5/5 in all 4.  Psychiatric: Normal judgment and insight. Alert and oriented x 3. Normal mood.     Labs on Admission: I have personally reviewed following labs and imaging studies  CBC: Recent Labs  Lab 04/08/20 1338  WBC 20.8*  NEUTROABS 16.7*  HGB 16.7  HCT 55.0*  MCV 94.8  PLT 710*   Basic Metabolic Panel: Recent Labs  Lab 04/08/20 1338  NA 133*  K 4.6  CL 93*  CO2 29  GLUCOSE 120*  BUN 8  CREATININE 0.49*  CALCIUM 9.3   GFR: Estimated Creatinine Clearance: 81.6 mL/min (A) (by C-G formula based on SCr of 0.49 mg/dL (L)). Liver Function Tests: Recent  Labs  Lab 04/08/20 1338  AST 20  ALT 15  ALKPHOS 94  BILITOT 0.5  PROT 8.2*  ALBUMIN 4.0   No results for input(s): LIPASE, AMYLASE in the last 168 hours. No results for input(s): AMMONIA in the last 168 hours. Coagulation Profile: No results for input(s): INR, PROTIME in the last 168 hours. Cardiac Enzymes: No results for input(s): CKTOTAL, CKMB, CKMBINDEX, TROPONINI in the last 168 hours. BNP (last 3 results) No results for input(s): PROBNP in the last 8760 hours. HbA1C: No results for input(s): HGBA1C in the last 72 hours. CBG: No results for input(s): GLUCAP in the last 168 hours. Lipid Profile: No results for input(s): CHOL, HDL, LDLCALC, TRIG, CHOLHDL, LDLDIRECT in the last 72 hours. Thyroid Function Tests: No results for input(s): TSH, T4TOTAL, FREET4, T3FREE, THYROIDAB in the last 72 hours. Anemia Panel: No results for input(s): VITAMINB12, FOLATE, FERRITIN, TIBC, IRON, RETICCTPCT in the last 72 hours. Urine analysis:    Component Value Date/Time   COLORURINE YELLOW 01/28/2017 1551   APPEARANCEUR HAZY (A) 01/28/2017 1551   LABSPEC 1.023 01/28/2017 1551   PHURINE 6.0 01/28/2017 [place] 01/28/2017 1551   HGBUR NEGATIVE 01/28/2017 [place] 01/28/2017 1551   KETONESUR NEGATIVE 01/28/2017 1551   PROTEINUR NEGATIVE 01/28/2017 1551   NITRITE NEGATIVE 01/28/2017 [place] 01/28/2017 1551    Radiological Exams on Admission: DG Chest Port 1 View  Result Date: 04/08/2020 CLINICAL DATA:  Shortness of breath, increasing shortness of breath. EXAM: PORTABLE CHEST 1 VIEW COMPARISON:  Chest x-rays dated 09/18/2019 in 11/24/2017. FINDINGS: Heart size and mediastinal contours appear stable. Lungs are hyperexpanded. Coarse lung markings are again seen bilaterally, not appreciably changed. Chronic bronchitic changes noted centrally. Presumed emphysematous blebs at the lung apices, RIGHT greater than LEFT. No pleural effusion or  pneumothorax is seen. No acute appearing osseous abnormality. IMPRESSION: 1. No active disease. No evidence of pneumonia or pulmonary edema. 2. Hyperexpanded lungs indicating COPD. Associated chronic bronchitic changes and probable chronic interstitial lung disease. Electronically Signed   By: Franki Cabot M.D.   On: 04/08/2020 14:18    EKG: Independently reviewed. Sinus tachycardia,  biatrial enlargement.  Assessment/Plan Active Problems:   Tobacco abuse   Respiratory failure, acute (HCC)   Anxiety state   Malnutrition of moderate degree (HCC)   COPD with acute exacerbation (HCC)   Acute on chronic respiratory failure (HCC)   GERD (gastroesophageal reflux disease)   Chronic pain syndrome   Positive ANA (antinuclear antibody)  (please populate well all problems here in Problem List. (For example, if patient is on BP meds at home and you resume or decide to hold them, it is a problem that needs to be her. Same for CAD, COPD, HLD and so on)   1. Acute on chronic respiratory failure - patient with hypoxemia at presentation, increased WOB usng abdominal muscle with neck retraction. He does continue to smoke. He was started on steroids as outpatient but ran out several weeks ago. Pulmonary saw him today. Plan Med-surg admit  Continue o2. Bipap ordered but respiratory therapy felt patient would not benefit now, Will watch carefully for progressive respiratory failure and initiate BiPap if he fatigues, desats.  IV steroids q 6 then taper to oral prednisone after 24 hrs.  Ceftriaxone and Azithromycin for acute bronchitis - transition to oral Abx when stable  Continue home inhalational therapy  2. Tobacco abuse - continues to smoke 1/2 to 1 ppd Plan Nicotine patch  3. GERD - will start PPI  4. Chronic pain - patient has orif right elbow. Question of rheumatologic disease. Reports he was taking oxycontin 10 mg TID but has not had a prescriber since Dr. Luan Pulling retired. Will try to avoid  narcotic Plan APAP 650 QID  Celebrex 200 mg BID  MS 4 mg IV q6 prn severe breakthrough pain  5. Malnutition - progressive problem. He says it is hard to eat when he can't breath Plan RD consult  6. [place] - palliative care consult placed re: palliation, goal of care, code status - willingness to be intubated, etc.  7. TOC - consult placed. Plan Lupus panel    DVT prophylaxis: Lovenox  Code Status: full code  Family Communication: tried to call Jim Like, mother. No answer either number. Left msg. (Specify name, relationship. Do not write "discussed with patient". Specify tel # if discussed over the phone) Disposition Plan: home when medically stable  (specify when and where you expect patient to be discharged) Consults called: Pulmonary - Dr. Halford Chessman (with names) Admission status: observation. (inpatient / obs / tele / medical floor / SDU)   Adella Hare MD [facility] Pager [phone]  If 7PM-7AM, please contact night-coverage [email or web address] Password [facility]  04/08/2020, 3:39 PM

## 2020-04-08 NOTE — Consult Note (Signed)
Consultation Note Date: 04/08/2020   Patient Name: Cameron Villarreal  DOB: 1965/12/13  MRN: [id number]  Age / Sex: 55 y.o., male  PCP: Lindell Spar, MD Referring Physician: Neena Rhymes, MD  Reason for Consultation: Establishing goals of care and Psychosocial/spiritual support  HPI/Patient Profile: 55 y.o. male  with past medical history of severe COPD with emphysema and bronchiectasis and continues to smoke.  He had lung cancer screening CT chest that showed evidence for possible atypical infection such as MAI admitted on 04/08/2020 with COPD exacerbation.   Clinical Assessment and Goals of Care: I have reviewed medical records including EPIC notes, labs and imaging, examined the patient and met at bedside with Mr. Rosete and his healthcare surrogate, his mother Stanton Kidney, to discuss diagnosis prognosis, GOC, EOL wishes, disposition and options.   I introduced Palliative Medicine as specialized medical care for people living with serious illness. It focuses on providing relief from the symptoms and stress of a serious illness.   We discussed a brief life review of the patient.  Mr. Montilla lives with his mother.  He states that she manages the household as he is is unable.  He spends most of his time in bed.  As far as functional and nutritional status, Mr. Narramore can complete his own ADLs, but cannot assist with housework.  Mrs. Bougie states that Kennth does not eat well.  We talked about the difficulties with eating as COPD worsens.  She shares that he drinks boost.  We talked about choices for supplemental nutritional support  We talked about the treatment plan including, but not limited to, IV antibiotics, breathing treatments, follow-up with pulmonology.  Advanced directives, concepts specific to code status,were considered and discussed.  Cursory CODE STATUS discussion started today.  Mr. Fortunato states that he and  his mother have never discussed CODE STATUS.  I encouraged him to think about what matters to him, while reassuring him that I do not believe that he would need life support at this time.  Questions and concerns were addressed.  The family was encouraged to call with questions or concerns.   PMT to continue to follow.  HCPOA   NEXT OF KIN -Mr. Deutscher names his mother, Thorn Demas, as his healthcare surrogate. He tells me he has a son who lives in [place] with his mother.    SUMMARY OF RECOMMENDATIONS   Continue full scope/full code PMT to continue CODE STATUS discussions Time for outcomes   Code Status/Advance Care Planning:  Full code -cursory CODE STATUS discussion started  Symptom Management:   Per hospitalist/CCM, no additional needs at this time.   Palliative Prophylaxis:   Frequent Pain Assessment and Oral Care  Additional Recommendations (Limitations, Scope, Preferences):  Full Scope Treatment  Psycho-social/Spiritual:   Desire for further Chaplaincy support:no  Additional Recommendations: Caregiving  Support/Resources and Education on Hospice  Prognosis:   Unable to determine, based on outcomes, end stage COPD is difficult for prognostication   Discharge Planning: to be determined, based on outcomes,  anticipate home.       Primary Diagnoses: Present on Admission: . Tobacco abuse . Respiratory failure, acute ([place]) . Anxiety state . Malnutrition of moderate degree ([place]) . GERD (gastroesophageal reflux disease) . Chronic pain syndrome . Positive ANA (antinuclear antibody) . Acute on chronic respiratory failure ([place])   I have reviewed the medical record, interviewed the patient and family, and examined the patient. The following aspects are pertinent.  Past Medical History:  Diagnosis Date  . Anxiety   . Anxiety disorder, unspecified   . Back pain   . Chest pain at rest 07/26/2011  . Chronic obstructive pulmonary disease, unspecified ([place])    . COPD (chronic obstructive pulmonary disease) (HCC)    Severe centrilobular emphysema, on home o2  . Cough   . Dyspnea   . GERD (gastroesophageal reflux disease)   . Hypercholesterolemia   . Insomnia   . Long term current use of opiate analgesic 04/12/2018  . Low back pain   . Nicotine dependence, unspecified, uncomplicated   . Other specified disorders of teeth and supporting structures   . Pneumonia   . Pulmonary nodule 07/04/2011  . RECTAL BLEEDING 04/21/2010   Qualifier: Diagnosis of  By: Algernon Huxley NP, Vicente Males    . S/P colonoscopy March 2012   Normal  . S/P endoscopy March 2012   Reflux esophagitis, no ulcerations  . Shortness of breath   . Unilateral inguinal hernia, without obstruction or gangrene, not specified as recurrent   . Unspecified protein-calorie malnutrition ([place])    Social History   Socioeconomic History  . Marital status: Single    Spouse name: Not on file  . Number of children: Not on file  . Years of education: Not on file  . Highest education level: Not on file  Occupational History  . Not on file  Tobacco Use  . Smoking status: Current Every Day Smoker    Packs/day: 0.25    Years: 30.00    Pack years: 7.50    Types: Cigarettes  . Smokeless tobacco: Never Used  . Tobacco comment: smokes 1/2 pack per day 04/08/2020  Vaping Use  . Vaping Use: Never used  Substance and Sexual Activity  . Alcohol use: No    Alcohol/week: 1.0 standard drink    Types: 1 Standard drinks or equivalent per week    Comment: Occasional  . Drug use: No  . Sexual activity: Never  Other Topics Concern  . Not on file  Social History Narrative   ** Merged History Encounter **       Social Determinants of Health   Financial Resource Strain: Not on file  Food Insecurity: Not on file  Transportation Needs: Not on file  Physical Activity: Not on file  Stress: Not on file  Social Connections: Not on file   Family History  Problem Relation Age of Onset  . Cirrhosis Father         Deceased, ETOH cirrhosis  . Alcoholism Father   . Depression Brother   . Stomach cancer Other        Aunt   Scheduled Meds: . acetaminophen  650 mg Oral QID  . [START ON 04/09/2020] azithromycin  500 mg Oral Daily  . celecoxib  200 mg Oral BID  . cyclobenzaprine  10 mg Oral BID  . enoxaparin (LOVENOX) injection  40 mg Subcutaneous Q24H  . gabapentin  300 mg Oral TID  . guaiFENesin  1,200 mg Oral BID  . ipratropium-albuterol  3 mL Nebulization Q6H  .  methylPREDNISolone (SOLU-MEDROL) injection  40 mg Intravenous Q6H   Followed by  . [START ON 04/10/2020] predniSONE  40 mg Oral Q breakfast  . nicotine  14 mg Transdermal Daily  . traZODone  50 mg Oral QHS   Continuous Infusions: . azithromycin    . [START ON 04/09/2020] cefTRIAXone (ROCEPHIN)  IV    . dextrose 5 % and 0.45% NaCl     PRN Meds:.acetaminophen, albuterol, albuterol, albuterol Medications Prior to Admission:  Prior to Admission medications   Medication Sig Start Date End Date Taking? Authorizing Provider  acetaminophen (TYLENOL) 500 MG tablet Take 500-1,000 mg by mouth every 6 (six) hours as needed (for pain or headaches).   Yes [provider]  albuterol (PROVENTIL) (2.5 MG/3ML) 0.083% nebulizer solution Take 3 mLs (2.5 mg total) by nebulization every 4 (four) hours as needed for wheezing or shortness of breath. 01/17/20  Yes Chesley Mires, MD  albuterol (VENTOLIN HFA) 108 (90 Base) MCG/ACT inhaler Inhale 2 puffs into the lungs 4 (four) times daily as needed. Patient taking differently: Inhale 2 puffs into the lungs 4 (four) times daily as needed for wheezing or shortness of breath. 09/23/19  Yes Chesley Mires, MD  Budeson-Glycopyrrol-Formoterol (BREZTRI AEROSPHERE) 160-9-4.8 MCG/ACT AERO Inhale 2 puffs into the lungs in the morning and at bedtime. 01/17/20  Yes Chesley Mires, MD  cyclobenzaprine (FLEXERIL) 10 MG tablet Take 1 tablet (10 mg total) by mouth 2 (two) times daily. 09/23/19  Yes Chesley Mires, MD  gabapentin  (NEURONTIN) 300 MG capsule Take 1 capsule (300 mg total) by mouth 3 (three) times daily. 03/17/20  Yes Lindell Spar, MD  predniSONE (DELTASONE) 5 MG tablet Take 2.5 mg by mouth daily. 01/17/20  Yes [provider]  sodium chloride HYPERTONIC 3 % nebulizer solution Take by nebulization as needed for other. 10/07/19  Yes Rigoberto Noel, MD  traZODone (DESYREL) 50 MG tablet Take 1 tablet (50 mg total) by mouth at bedtime. 02/20/20  Yes Lindell Spar, MD  nicotine (NICODERM CQ - DOSED IN MG/24 HOURS) 14 mg/24hr patch Place 1 patch (14 mg total) onto the skin daily. Patient not taking: Reported on 04/08/2020 01/17/20   Chesley Mires, MD   Allergies  Allergen Reactions  . Codeine Itching and Nausea Only  . Hydrocodone Itching, Swelling and Other (See Comments)    "Makes it difficult to swallow," but no breathing impairment noted Patient state he can take Oxycodone took it for his arm fracture recently.   Review of Systems  Unable to perform ROS: Acuity of condition  Respiratory: Shortness of breath: .myp.     Physical Exam Vitals and nursing note reviewed.     Vital Signs: BP (!) 115/99   Pulse (!) 109   Temp 98 F (36.7 C) (Oral)   Resp (!) 22   Ht $R'5\' 8"'oY$  (1.727 m)   Wt 55.3 kg   SpO2 92%   BMI 18.55 kg/m  Pain Scale: 0-10   Pain Score: 8    SpO2: SpO2: 92 % O2 Device:SpO2: 92 % O2 Flow Rate: .O2 Flow Rate (L/min): 3 L/min  IO: Intake/output summary:   Intake/Output Summary (Last 24 hours) at 04/08/2020 1619 Last data filed at 04/08/2020 1502 Gross per 24 hour  Intake 550 ml  Output -  Net 550 ml    LBM:   Baseline Weight: Weight: 55.3 kg Most recent weight: Weight: 55.3 kg     Palliative Assessment/Data:   Clinical research associate Row Most Recent  Value  Intake Tab   Referral Department Pulmonary  Unit at Time of Referral ER  Palliative Care Primary Diagnosis Pulmonary  Date Notified 04/08/20  Palliative Care Type New Palliative care  Reason for  referral Clarify Goals of Care  Date of Admission 04/08/20  Date first seen by Palliative Care 04/08/20  # of days Palliative referral response time 0 Day(s)  # of days IP prior to Palliative referral 0  Clinical Assessment   Palliative Performance Scale Score 40%  Pain Max last 24 hours Not able to report  Pain Min Last 24 hours Not able to report  Dyspnea Max Last 24 Hours Not able to report  Dyspnea Min Last 24 hours Not able to report  Psychosocial & Spiritual Assessment   Palliative Care Outcomes       Time In: 1550 Time Out: 1620 Time Total: 30 minutes  Greater than 50%  of this time was spent counseling and coordinating care related to the above assessment and plan.  Signed by: Drue Novel, NP   Please contact Palliative Medicine Team phone at [phone] for questions and concerns.  For individual provider: See Shea Evans

## 2020-04-08 NOTE — ED Triage Notes (Signed)
Pt to er, pt states that he is normally on 2L of O2 at home and does a breathing treatment every 4 hours. States that he is here for increased SOB, states that he has been SOB for "awhile" pt has some tripoding, pt talking in short sentences.

## 2020-04-08 NOTE — Consult Note (Signed)
NAME:  Cameron Villarreal, MRN:  [id number], DOB:  07-Dec-1965, LOS: 0 ADMISSION DATE:  04/08/2020, CONSULTATION DATE:  04/08/2020 REFERRING MD:  Dr. Melina Copa, ER, CHIEF COMPLAINT:  Short of breath   Brief History:  55 yo male smoker sent from pulmonary office for admission due to COPD exacerbation.  History of Present Illness:  He has severe COPD with emphysema and bronchiectasis and continues to smoke.  He had lung cancer screening CT chest that showed evidence for possible atypical infection such as MAI.  He has more cough with brown sputum, chest tightness, wheezing, fatigue, and weakness.  Needing increased O2 levels at home.  Seen in pulmonary office earlier today and decision made to send to ER for hospital admission.  Received solumedrol, morphine, magnesium, antibiotics, and IV fluid bolus in ER.  Notes improvement after therapy.  Still having dyspnea and chest tightness, but feels air getting into lung better.  Past Medical History:  Anxiety, GERD, HLD, Insomnia, Back pain, Pneumonia, ANA positive  Significant Hospital Events:  3/02 admit  Consults:    Procedures:    Significant Diagnostic Tests:   LDCT 03/16/20 >> numerous b/l nodules with tree in bud, new 8.8 mm nodule RLL, new 7.9 mm nodule LLL   Micro Data:  COVID 3/02 >> negative Flu PCR 3/02 >> negative Sputum 3/02 >> Sputum AFB 3/02 >>  Antimicrobials:  Rocephin 3/02 >> Zithromax 3/02 >>    Interim History / Subjective:    Objective   Blood pressure (!) 115/99, pulse (!) 109, temperature 98 F (36.7 C), temperature source Oral, resp. rate (!) 22, height 5\' 8"  (1.727 m), weight 55.3 kg, SpO2 91 %.        Intake/Output Summary (Last 24 hours) at 04/08/2020 1522 Last data filed at 04/08/2020 1502 Gross per 24 hour  Intake 550 ml  Output --  Net 550 ml   Filed Weights   04/08/20 1324  Weight: 55.3 kg    Examination:  General - alert, cachectic Eyes - pupils reactive ENT - no sinus tenderness, no stridor,  raspy voice Cardiac - regular, tachycardic, no murmur Chest - b/l expiratory wheezing Abdomen - soft, non tender, + bowel sounds Extremities - no cyanosis, clubbing, or edema Skin - no rashes Neuro - normal strength, moves extremities, follows commands Psych - normal mood and behavior   Resolved Hospital Problem list     Assessment & Plan:   Acute on chronic hypoxic respiratory failure from COPD and bronchiectasis exacerbation. Severe COPD from emphysema with continued tobacco use. - goal SpO2 90 to 95% - solumedrol 60 mg q6h - scheduled BDs; likely needs nebulizer therapy since I don't think he can effectively use HFAs or DPIs - day 1 of ABX - sputum for gram stain and culture, sputum for AFB checking for atypical mycobacterial infection (no concern for M tb) - will need to check A1AT level >> plan closer to discharge - will need nicotine patch while in hospital - bronchial hygiene  Cachexia from COPD. - would benefit from nutrition assessment  Chronic pain. - per primary team  Sinus tachycardia likely from hypovolemia. - improved with IV fluids - monitor heart rhythm  Goals of care. - would benefit from palliative care assessment  Deconditioning. - pt's mother requested assessment for hospital bed and wheelchair for home use >> defer to primary team   Best practice (evaluated daily)  Diet: Regular DVT prophylaxis: lovenox GI prophylaxis: not indicated Mobility: will need PT/OT assessment Disposition: admit to hospital  with [facility] as primary and PCCM as consult Code Status: full code  Labs    CMP Latest Ref Rng & Units 04/08/2020 09/18/2019 04/12/2018  Glucose 70 - 99 mg/dL 120(H) 104(H) 93  BUN 6 - 20 mg/dL 8 6 8   Creatinine 0.61 - 1.24 mg/dL 0.49(L) 0.71 0.77  Sodium 135 - 145 mmol/L 133(L) 134(L) 134  Potassium 3.5 - 5.1 mmol/L 4.6 3.9 5.0  Chloride 98 - 111 mmol/L 93(L) 98 93(L)  CO2 22 - 32 mmol/L 29 28 -  Calcium 8.9 - 10.3 mg/dL 9.3 9.3 9.6  Total  Protein 6.5 - 8.1 g/dL 8.2(H) - 7.5  Total Bilirubin 0.3 - 1.2 mg/dL 0.5 - <0.2  Alkaline Phos 38 - 126 U/L 94 - 128(H)  AST 15 - 41 U/L 20 - 19  ALT 0 - 44 U/L 15 - -    CBC Latest Ref Rng & Units 04/08/2020 09/18/2019 11/25/2017  WBC 4.0 - 10.5 K/uL 20.8(H) 15.9(H) 10.7(H)  Hemoglobin 13.0 - 17.0 g/dL 16.7 17.1(H) 13.1  Hematocrit 39.0 - 52.0 % 55.0(H) 55.3(H) 42.1  Platelets 150 - 400 K/uL 505(H) 300 433(H)    ABG    Component Value Date/Time   PHART 7.338 (L) 01/26/2017 1930   PCO2ART 59.2 (H) 01/26/2017 1930   PO2ART 96.5 01/26/2017 1930   HCO3 26.7 09/18/2019 0354   TCO2 26 11/13/2017 2132   ACIDBASEDEF 0.1 02/04/2015 1610   O2SAT 31.2 09/18/2019 0354    CBG (last 3)  No results for input(s): GLUCAP in the last 72 hours.   Review of Systems:   Reviewed and negative  Past Medical History:  He,  has a past medical history of Anxiety, Anxiety disorder, unspecified, Back pain, Chest pain at rest (07/26/2011), Chronic obstructive pulmonary disease, unspecified ([place]), COPD (chronic obstructive pulmonary disease) ([place]), Cough, Dyspnea, GERD (gastroesophageal reflux disease), Hypercholesterolemia, Insomnia, Long term current use of opiate analgesic (04/12/2018), Low back pain, Nicotine dependence, unspecified, uncomplicated, Other specified disorders of teeth and supporting structures, Pneumonia, Pulmonary nodule (07/04/2011), RECTAL BLEEDING (04/21/2010), S/P colonoscopy (March 2012), S/P endoscopy (March 2012), Shortness of breath, Unilateral inguinal hernia, without obstruction or gangrene, not specified as recurrent, and Unspecified protein-calorie malnutrition ([place]).   Surgical History:   Past Surgical History:  Procedure Laterality Date  . APPENDECTOMY    . CHOLECYSTECTOMY    . COLONOSCOPY    . HERNIA REPAIR    . KNEE SURGERY Right    laceration wired back together  . OPEN REDUCTION INTERNAL FIXATION (ORIF) DISTAL RADIAL FRACTURE Right 11/17/2017   Procedure: OPEN REDUCTION  INTERNAL FIXATION (ORIF) DISTAL RADIAL FRACTURE;  Surgeon: Shona Needles, MD;  Location: [facility];  Service: Orthopedics;  Laterality: Right;  . ORIF HUMERUS FRACTURE Right 11/17/2017   Procedure: OPEN REDUCTION INTERNAL FIXATION (ORIF) DISTAL HUMERUS FRACTURE;  Surgeon: Shona Needles, MD;  Location: [facility];  Service: Orthopedics;  Laterality: Right;  . Right inguinal hernia repair       Social History:   reports that he has been smoking cigarettes. He has a 7.50 pack-year smoking history. He has never used smokeless tobacco. He reports that he does not drink alcohol and does not use drugs.   Family History:  His family history includes Alcoholism in his father; Cirrhosis in his father; Depression in his brother; Stomach cancer in an other family member.   Allergies Allergies  Allergen Reactions  . Codeine Itching and Nausea Only  . Hydrocodone Itching, Swelling and Other (See Comments)    "  Makes it difficult to swallow," but no breathing impairment noted Patient state he can take Oxycodone took it for his arm fracture recently.     Home Medications  Prior to Admission medications   Medication Sig Start Date End Date Taking? Authorizing Provider  acetaminophen (TYLENOL) 500 MG tablet Take 500-1,000 mg by mouth every 6 (six) hours as needed (for pain or headaches).    [provider]  albuterol (PROVENTIL) (2.5 MG/3ML) 0.083% nebulizer solution Take 3 mLs (2.5 mg total) by nebulization every 4 (four) hours as needed for wheezing or shortness of breath. 01/17/20   Chesley Mires, MD  albuterol (VENTOLIN HFA) 108 (90 Base) MCG/ACT inhaler Inhale 2 puffs into the lungs 4 (four) times daily as needed. 09/23/19   Chesley Mires, MD  Budeson-Glycopyrrol-Formoterol (BREZTRI AEROSPHERE) 160-9-4.8 MCG/ACT AERO Inhale 2 puffs into the lungs in the morning and at bedtime. 01/17/20   Chesley Mires, MD  cyclobenzaprine (FLEXERIL) 10 MG tablet Take 1 tablet (10 mg total) by mouth 2 (two) times daily.  09/23/19   Chesley Mires, MD  gabapentin (NEURONTIN) 300 MG capsule Take 1 capsule (300 mg total) by mouth 3 (three) times daily. 03/17/20   Lindell Spar, MD  nicotine (NICODERM CQ - DOSED IN MG/24 HOURS) 14 mg/24hr patch Place 1 patch (14 mg total) onto the skin daily. 01/17/20   Chesley Mires, MD  sodium chloride HYPERTONIC 3 % nebulizer solution Take by nebulization as needed for other. 10/07/19   Rigoberto Noel, MD  traZODone (DESYREL) 50 MG tablet Take 1 tablet (50 mg total) by mouth at bedtime. 02/20/20   Lindell Spar, MD     Signature:  Chesley Mires, MD [place] Pager - [phone] 04/08/2020, 3:47 PM

## 2020-04-08 NOTE — Patient Instructions (Signed)
Go to the ER at [facility] for admission

## 2020-04-08 NOTE — Plan of Care (Signed)

## 2020-04-09 DIAGNOSIS — J9622 Acute and chronic respiratory failure with hypercapnia: Secondary | ICD-10-CM

## 2020-04-09 DIAGNOSIS — Z720 Tobacco use: Secondary | ICD-10-CM | POA: Diagnosis not present

## 2020-04-09 DIAGNOSIS — R768 Other specified abnormal immunological findings in serum: Secondary | ICD-10-CM | POA: Diagnosis not present

## 2020-04-09 DIAGNOSIS — Z7189 Other specified counseling: Secondary | ICD-10-CM | POA: Diagnosis not present

## 2020-04-09 DIAGNOSIS — Z515 Encounter for palliative care: Secondary | ICD-10-CM | POA: Diagnosis not present

## 2020-04-09 DIAGNOSIS — E43 Unspecified severe protein-calorie malnutrition: Secondary | ICD-10-CM | POA: Insufficient documentation

## 2020-04-09 DIAGNOSIS — J441 Chronic obstructive pulmonary disease with (acute) exacerbation: Secondary | ICD-10-CM | POA: Diagnosis not present

## 2020-04-09 DIAGNOSIS — J9621 Acute and chronic respiratory failure with hypoxia: Secondary | ICD-10-CM | POA: Diagnosis not present

## 2020-04-09 DIAGNOSIS — G894 Chronic pain syndrome: Secondary | ICD-10-CM | POA: Diagnosis not present

## 2020-04-09 LAB — CBC WITH DIFFERENTIAL/PLATELET
Abs Immature Granulocytes: 0.1 10*3/uL — ABNORMAL HIGH (ref 0.00–0.07)
Basophils Absolute: 0 10*3/uL (ref 0.0–0.1)
Basophils Relative: 0 %
Eosinophils Absolute: 0 10*3/uL (ref 0.0–0.5)
Eosinophils Relative: 0 %
HCT: 51.4 % (ref 39.0–52.0)
Hemoglobin: 15.5 g/dL (ref 13.0–17.0)
Immature Granulocytes: 1 %
Lymphocytes Relative: 8 %
Lymphs Abs: 0.9 10*3/uL (ref 0.7–4.0)
MCH: 28.4 pg (ref 26.0–34.0)
MCHC: 30.2 g/dL (ref 30.0–36.0)
MCV: 94.1 fL (ref 80.0–100.0)
Monocytes Absolute: 0.2 10*3/uL (ref 0.1–1.0)
Monocytes Relative: 2 %
Neutro Abs: 10 10*3/uL — ABNORMAL HIGH (ref 1.7–7.7)
Neutrophils Relative %: 89 %
Platelets: 436 10*3/uL — ABNORMAL HIGH (ref 150–400)
RBC: 5.46 MIL/uL (ref 4.22–5.81)
RDW: 14.1 % (ref 11.5–15.5)
WBC: 11.2 10*3/uL — ABNORMAL HIGH (ref 4.0–10.5)
nRBC: 0 % (ref 0.0–0.2)

## 2020-04-09 LAB — BASIC METABOLIC PANEL
Anion gap: 8 (ref 5–15)
BUN: 9 mg/dL (ref 6–20)
CO2: 30 mmol/L (ref 22–32)
Calcium: 9 mg/dL (ref 8.9–10.3)
Chloride: 97 mmol/L — ABNORMAL LOW (ref 98–111)
Creatinine, Ser: 0.36 mg/dL — ABNORMAL LOW (ref 0.61–1.24)
GFR, Estimated: >60 mL/min (ref >60)
Glucose, Bld: 139 mg/dL — ABNORMAL HIGH (ref 70–99)
Potassium: 4.4 mmol/L (ref 3.5–5.1)
Sodium: 135 mmol/L (ref 135–145)

## 2020-04-09 LAB — BLOOD GAS, VENOUS
Acid-Base Excess: 7.4 mmol/L — ABNORMAL HIGH (ref 0.0–2.0)
Bicarbonate: 29.6 mmol/L — ABNORMAL HIGH (ref 20.0–28.0)
FIO2: 28
O2 Saturation: 96.9 %
Patient temperature: 37
pCO2, Ven: 57 mmHg (ref 44.0–60.0)
pH, Ven: 7.375 (ref 7.250–7.430)
pO2, Ven: 88 mmHg — ABNORMAL HIGH (ref 32.0–45.0)

## 2020-04-09 LAB — EXTRACTABLE NUCLEAR ANTIGEN ANTIBODY
ENA SM Ab Ser-aCnc: <0.2 AI (ref 0.0–0.9)
Ribonucleic Protein: 0.6 AI (ref 0.0–0.9)
SSA (Ro) (ENA) Antibody, IgG: <0.2 AI (ref 0.0–0.9)
SSB (La) (ENA) Antibody, IgG: <0.2 AI (ref 0.0–0.9)
Scleroderma (Scl-70) (ENA) Antibody, IgG: <0.2 AI (ref 0.0–0.9)
ds DNA Ab: 51 IU/mL — ABNORMAL HIGH (ref 0–9)

## 2020-04-09 LAB — EXPECTORATED SPUTUM ASSESSMENT W GRAM STAIN, RFLX TO RESP C: Special Requests: NORMAL

## 2020-04-09 LAB — ANTI-JO 1 ANTIBODY, IGG: Anti JO-1: <0.2 AI (ref 0.0–0.9)

## 2020-04-09 MED ORDER — ENSURE ENLIVE PO LIQD
237.0000 mL | Freq: Three times a day (TID) | ORAL | Status: DC
  Administered 2020-04-09 – 2020-04-14 (×16): 237 mL via ORAL

## 2020-04-09 MED ORDER — ARFORMOTEROL TARTRATE 15 MCG/2ML IN NEBU
15.0000 ug | INHALATION_SOLUTION | Freq: Two times a day (BID) | RESPIRATORY_TRACT | Status: DC
  Administered 2020-04-09 – 2020-04-14 (×10): 15 ug via RESPIRATORY_TRACT
  Filled 2020-04-09 (×10): qty 2

## 2020-04-09 MED ORDER — REVEFENACIN 175 MCG/3ML IN SOLN
175.0000 ug | Freq: Every day | RESPIRATORY_TRACT | Status: DC
  Administered 2020-04-10 – 2020-04-14 (×5): 175 ug via RESPIRATORY_TRACT
  Filled 2020-04-09 (×4): qty 3

## 2020-04-09 MED ORDER — BUDESONIDE 0.5 MG/2ML IN SUSP
0.5000 mg | Freq: Two times a day (BID) | RESPIRATORY_TRACT | Status: DC
  Administered 2020-04-09 – 2020-04-14 (×10): 0.5 mg via RESPIRATORY_TRACT
  Filled 2020-04-09 (×10): qty 2

## 2020-04-09 MED ORDER — IPRATROPIUM-ALBUTEROL 0.5-2.5 (3) MG/3ML IN SOLN: 3.0000 mL | RESPIRATORY_TRACT | Status: DC

## 2020-04-09 MED ORDER — MORPHINE SULFATE (CONCENTRATE) 10 MG/0.5ML PO SOLN
2.6000 mg | ORAL | Status: DC | PRN
  Administered 2020-04-09 – 2020-04-13 (×17): 2.6 mg via ORAL
  Filled 2020-04-09 (×17): qty 0.5

## 2020-04-09 MED ORDER — PROSOURCE PLUS PO LIQD
30.0000 mL | Freq: Two times a day (BID) | ORAL | Status: DC
  Administered 2020-04-09 – 2020-04-14 (×11): 30 mL via ORAL
  Filled 2020-04-09 (×10): qty 30

## 2020-04-09 MED ORDER — GUAIFENESIN 100 MG/5ML PO SOLN
5.0000 mL | ORAL | Status: DC | PRN
  Administered 2020-04-09 – 2020-04-11 (×5): 100 mg via ORAL
  Filled 2020-04-09 (×5): qty 5

## 2020-04-09 NOTE — Progress Notes (Signed)
Patient has a Bipap PRN order.  Bipap is not needed at this time.  Patient is not showing any signs of respiratory distress.  Patient's O2 sat was at 89% on 3L.  Will continue to monitor this.

## 2020-04-09 NOTE — Plan of Care (Signed)

## 2020-04-09 NOTE — Care Management Important Message (Deleted)
Important Message  Patient Details  Name: Cameron Villarreal MRN: [id number] Date of Birth: 1965/09/17   Medicare Important Message Given:  Yes     Tommy Medal 04/09/2020, 3:48 PM

## 2020-04-09 NOTE — Progress Notes (Signed)
OT Cancellation Note  Patient Details Name: Cameron Villarreal MRN: [id number] DOB: December 29, 1965   Cancelled Treatment:    Reason Eval/Treat Not Completed: Patient declined, no reason specified Pt refused evaluation this date. Stated that PT had already walked him and he did not want to get out of bed or go to the edge of the bed at that time. Will check back with pt as time permits to attempt evaluation.   Nillie Bartolotta OT, MOT   Larey Seat 04/09/2020, 9:58 AM

## 2020-04-09 NOTE — Progress Notes (Signed)
Palliative: [name], Cameron Villarreal, is lying quietly in bed.  Cameron Villarreal greets me briefly making but not keeping eye contact.  Cameron Villarreal appears chronically ill and quite frail.  Cameron Villarreal is alert and oriented, able to make his basic needs known.  There is no family at bedside at this time.  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  We talked about Cameron Villarreal "end-stage" COPD.  Cameron Villarreal tells me that Cameron Villarreal has cut back from 2 packs a day to half a pack per day.  Cameron Villarreal would like to stop smoking, but has been unable.  Cameron Villarreal shares that Cameron Villarreal follows up regularly with pulmonologist, Dr. Halford Chessman in [place].  Cameron Villarreal shares that Cameron Villarreal saw previous pulmonologist, Dr. Luan Pulling, for many years.  Cameron Villarreal states that Cameron Villarreal feels that Cameron Villarreal has had weight loss, but is unable to give specifics.  As I am leaving, RD arrives to discuss nutrition.  We talked about outpatient palliative services, what they can provide.  Cameron Villarreal is agreeable to further goals of care discussion/CODE STATUS discussions with outpatient palliative services.  We talked about CODE STATUS, "treat the treatable, but allowing natural death".  I share that with DNR, people still receive medicines, IV fluids, IV antibiotics, lab draws images, but, if they are passing, or are found dead, no CPR or intubation would be performed.  Cameron Villarreal tells me that Cameron Villarreal likes this idea.  I encouraged him to have discussions with his mother and outpatient palliative team, Cameron Villarreal agrees.  We talked about symptom management.  Cameron Villarreal states that the morphine Cameron Villarreal was given in the ED yesterday really helped his breathing.  We talked about a small by mouth dose of morphine as a trial for breathlessness, and Cameron Villarreal agrees.  We talked about the difficulty in finding a prescriber, Cameron Villarreal states understanding.  We talked about the use of by mouth morphine, not IV morphine.  Morphine 2.6 mg p.o. every 4 hours as needed added.  Conference with attending, bedside nursing staff, transition of care team related to patient  condition, needs, goals of care, CODE STATUS discussions.  Plan: At this point full scope/full code.  Agreeable to outpatient palliative services.   12 minutes  [name], NP  Palliative medicine team Team phone [phone] Greater than 50% of this time was spent counseling and coordinating care related to the above assessment and plan.

## 2020-04-09 NOTE — Evaluation (Signed)
Physical Therapy Evaluation Patient Details Name: Cameron Villarreal MRN: [id number] DOB: 03/11/1965 Today's Date: 04/09/2020   History of Present Illness  Cameron Villarreal is a 55 y.o. male with medical history significant of advanced COPD - emphysema, chronic bronchitis, bronchiectasis on home oxygen; GERD, Anxiety, chronic pain with skeletal abnormality and protein-calorie malnutrition. He presented to pulmonary clinic, Dr. Halford Chessman, today having more cough and sputum,  Bringing up brown phlegm.  Doesn't feel like he can get enough air into his lungs.  Feels tight in his chest.  Needing increased amounts of oxygen at home.  Ran out of prednisone - wasn't sure he was supposed to stay on prednisone.  Not awrare of fever.  Denies hemoptysis.  Not much appetite.  Feeling weaker. He was instructed to present to [facility] for evaluation and admission.    Clinical Impression  Patient apprehensive to get up, requires encouragement and near baseline for functional mobility and gait demonstrating slightly labored movement for taking steps in room without loss of balance, on 2 LPM with SpO2 at 95% when walking and declined to sit up in chair secondary to c/o fatigue - RN notified.  Patient will benefit from continued physical therapy in hospital and recommended venue below to increase strength, balance, endurance for safe ADLs and gait.    Follow Up Recommendations No PT follow up    Equipment Recommendations  None recommended by PT    Recommendations for Other Services       Precautions / Restrictions Precautions Precautions: None Restrictions Weight Bearing Restrictions: No      Mobility  Bed Mobility Overal bed mobility: Modified Independent                  Transfers Overall transfer level: Modified independent                  Ambulation/Gait Ambulation/Gait assistance: Modified independent (Device/Increase time);Supervision Gait Distance (Feet): 15 Feet Assistive device: None Gait  Pattern/deviations: Decreased step length - left;Decreased stance time - right;Decreased stride length Gait velocity: decreased   General Gait Details: slightly labored movement without loss of balance, limited mostly due to c/o fatigue, on 2 LPM with SpO2 at 95%  Stairs            Wheelchair Mobility    Modified Rankin (Stroke Patients Only)       Balance Overall balance assessment: No apparent balance deficits (not formally assessed)                                           Pertinent Vitals/Pain Pain Assessment: No/denies pain    Home Living Family/patient expects to be discharged to:: Private residence Living Arrangements: Parent Available Help at Discharge: Family;Available 24 hours/day Type of Home: House Home Access: Ramped entrance     Home Layout: One level Home Equipment: Shower seat      Prior Function Level of Independence: Independent with assistive device(s)         Comments: household ambulator without AD, home O2 dependent 2 LPM     Hand Dominance        Extremity/Trunk Assessment   Upper Extremity Assessment Upper Extremity Assessment: Overall WFL for tasks assessed    Lower Extremity Assessment Lower Extremity Assessment: Overall WFL for tasks assessed    Cervical / Trunk Assessment Cervical / Trunk Assessment: Normal  Communication   Communication: No  difficulties  Cognition Arousal/Alertness: Awake/alert Behavior During Therapy: WFL for tasks assessed/performed Overall Cognitive Status: Within Functional Limits for tasks assessed                                        General Comments      Exercises     Assessment/Plan    PT Assessment Patient needs continued PT services  PT Problem List Decreased balance;Decreased mobility;Decreased activity tolerance;Decreased strength       PT Treatment Interventions Gait training;Stair training;Functional mobility training;Therapeutic  activities;Therapeutic exercise;Balance training;Patient/family education    PT Goals (Current goals can be found in the Care Plan section)  Acute Rehab PT Goals Patient Stated Goal: return home with family to assist PT Goal Formulation: With patient Time For Goal Achievement: 04/16/20 Potential to Achieve Goals: Good    Frequency Min 2X/week   Barriers to discharge        Co-evaluation               AM-PAC PT "6 Clicks" Mobility  Outcome Measure Help needed turning from your back to your side while in a flat bed without using bedrails?: None Help needed moving from lying on your back to sitting on the side of a flat bed without using bedrails?: None Help needed moving to and from a bed to a chair (including a wheelchair)?: None Help needed standing up from a chair using your arms (e.g., wheelchair or bedside chair)?: None Help needed to walk in hospital room?: A Little Help needed climbing 3-5 steps with a railing? : A Lot 6 Click Score: 21    End of Session Equipment Utilized During Treatment: Oxygen Activity Tolerance: Patient tolerated treatment well;Patient limited by fatigue Patient left: in bed;with call bell/phone within reach Nurse Communication: Mobility status PT Visit Diagnosis: Unsteadiness on feet (R26.81);Other abnormalities of gait and mobility (R26.89);Muscle weakness (generalized) (M62.81)    Time: 8887-5797 PT Time Calculation (min) (ACUTE ONLY): 14 min   Charges:   PT Evaluation $PT Eval Low Complexity: 1 Low PT Treatments $Therapeutic Activity: 8-22 mins        11:29 AM, 04/09/20 Lonell Grandchild, MPT Physical Therapist with [facility] [phone] office [phone] mobile phone

## 2020-04-09 NOTE — Progress Notes (Signed)
Initial Nutrition Assessment  DOCUMENTATION CODES:   Severe malnutrition in context of chronic illness  INTERVENTION:  Ensure Enlive po TID, each supplement provides 350 kcal and 20 grams of protein  ProSource Plus 30 ml po BID, each supplement provides 100 kcal and 15 grams of protein  Magic cup BID with meals, each supplement provides 290 kcal and 9 grams of protein  Downgrade to DYS 3 diet for ease of intake  Education provided   Will provide coupons for Ensure supplements   NUTRITION DIAGNOSIS:   Severe Malnutrition related to chronic illness (advanced COPD) as evidenced by moderate muscle depletion,severe muscle depletion,moderate fat depletion,severe fat depletion.    GOAL:   Patient will meet greater than or equal to 90% of their needs    MONITOR:   PO intake,Supplement acceptance,Weight trends,Labs,I & O's,Skin  REASON FOR ASSESSMENT:   Malnutrition Screening Tool,Consult Assessment of nutrition requirement/status  ASSESSMENT:  55 year old male admitted with COPD exacerbation. Past medical history of advanced COPD, emphysema, chronic bronchitis, bronchiectasis on home oxygen, GERD, anxiety, chronic pain with skeletal abnormality, nicotine dependence presented from pulmonary clinic with increased cough productive of brown sputum, SOB, decreased appetite, weakness, and increased oxygen requirement at home.  Palliative leaving room this morning at time of visit. Pt reports doing okay today, recalls receiving sausage and a piece of toast for breakfast, says he only ate the sausage. He is looking forward to lunch, ordered pot roast, mashed pots and gravy. Pt reports appetite and intake at home has been poor, states it is hard to eat when you are short of breath. He endorses wt loss, recalls weighing 160 lbs years ago, but more recent usual wt 135-140 lbs. Per chart, weights have decreased ~6 lbs (4.8%) in the last 3 months which is insignificant for time frame, however  he is ~18 lbs (13%) under reported usual weight which is significant. Pt with severe fat/muscle depletions to entire body, meeting criteria for severe malnutrition. RD discussed strategies for improving intake at home and weighs to increase calories/protien. He drinks Boost supplements sometimes and agreeable to strawberry Ensure as well as downgrading to DYS 3 diet during admission.   I/Os: +580 ml since admit UOP: 1350 ml x 24 hrs  Medications reviewed and include: Zithromax, Celebrex, Gabapentin, Methylprednisolone, IV Rocephin  Labs reviewed   NUTRITION - FOCUSED PHYSICAL EXAM:  Flowsheet Row Most Recent Value  Orbital Region Severe depletion  Upper Arm Region Moderate depletion  Thoracic and Lumbar Region Severe depletion  Buccal Region Severe depletion  Temple Region Moderate depletion  Clavicle Bone Region Severe depletion  Clavicle and Acromion Bone Region Moderate depletion  Scapular Bone Region Unable to assess  Dorsal Hand Moderate depletion  Patellar Region Severe depletion  Anterior Thigh Region Unable to assess  Posterior Calf Region Severe depletion  Edema (RD Assessment) None  Hair Reviewed  Eyes Reviewed  Mouth Reviewed  Skin Reviewed  Nails Reviewed       Diet Order:   Diet Order            Diet regular Room service appropriate? Yes; Fluid consistency: Thin  Diet effective now                 EDUCATION NEEDS:   Education needs have been addressed  Skin:  Skin Assessment: Reviewed RN Assessment  Last BM:  pta  Height:   Ht Readings from Last 1 Encounters:  04/08/20 5\' 8"  (1.727 m)    Weight:   Wt  Readings from Last 1 Encounters:  04/08/20 55.3 kg    BMI:  Body mass index is 18.55 kg/m.  Estimated Nutritional Needs:   Kcal:  4193-7902  Protein:  94-105  Fluid:  >/=1.7 L/day    Lajuan Lines, RD, LDN Clinical Nutrition After Hours/Weekend Pager # in [place]

## 2020-04-09 NOTE — Plan of Care (Signed)
  Problem: Acute Rehab PT Goals(only PT should resolve) Goal: Pt Will Go Supine/Side To Sit Outcome: Progressing Flowsheets (Taken 04/09/2020 1131) Pt will go Supine/Side to Sit:  Independently  with modified independence Goal: Patient Will Transfer Sit To/From Stand Outcome: Progressing Flowsheets (Taken 04/09/2020 1131) Patient will transfer sit to/from stand:  Independently  with modified independence Goal: Pt Will Transfer Bed To Chair/Chair To Bed Outcome: Progressing Flowsheets (Taken 04/09/2020 1131) Pt will Transfer Bed to Chair/Chair to Bed:  Independently  with modified independence Goal: Pt Will Ambulate Outcome: Progressing Flowsheets (Taken 04/09/2020 1131) Pt will Ambulate:  25 feet  with supervision  with least restrictive assistive device   11:31 AM, 04/09/20 Lonell Grandchild, MPT Physical Therapist with [facility] [phone] office [phone] mobile phone

## 2020-04-09 NOTE — Progress Notes (Signed)
NAME:  Cameron Villarreal, MRN:  [id number], DOB:  Mar 18, 1965, LOS: 0 ADMISSION DATE:  04/08/2020, CONSULTATION DATE:  04/08/2020 REFERRING MD:  Dr. Melina Copa, ER, CHIEF COMPLAINT:  Short of breath   Brief History:  55 yo male smoker sent from pulmonary office for admission due to COPD exacerbation.  History of Present Illness:  He has severe COPD with emphysema and bronchiectasis and continues to smoke.  He had lung cancer screening CT chest that showed evidence for possible atypical infection such as MAI.  He has more cough with brown sputum, chest tightness, wheezing, fatigue, and weakness.  Needing increased O2 levels at home.  Seen in pulmonary office earlier today and decision made to send to ER for hospital admission.  Received solumedrol, morphine, magnesium, antibiotics, and IV fluid bolus in ER.  Notes improvement after therapy.  Still having dyspnea and chest tightness, but feels air getting into lung better.  Past Medical History:  Anxiety, GERD, HLD, Insomnia, Back pain, Pneumonia, ANA positive  Significant Hospital Events:  3/02 admit  Consults:  Palliative care  Procedures:    Significant Diagnostic Tests:   LDCT 03/16/20 >> numerous b/l nodules with tree in bud, new 8.8 mm nodule RLL, new 7.9 mm nodule LLL   Micro Data:  COVID 3/02 >> negative Flu PCR 3/02 >> negative Sputum 3/02 >> Sputum AFB 3/02 >>  Antimicrobials:  Rocephin 3/02 >> Zithromax 3/02 >>    Interim History / Subjective:  Walked in room some with PT.  Feels worn out.  Still has cough with sputum.  Breathing might be a little easier.  Objective   Blood pressure 100/68, pulse 90, temperature (!) 97.2 F (36.2 C), temperature source Tympanic, resp. rate 18, height 5\' 8"  (1.727 m), weight 55.3 kg, SpO2 96 %.        Intake/Output Summary (Last 24 hours) at 04/09/2020 1423 Last data filed at 04/09/2020 1751 Gross per 24 hour  Intake 1250 ml  Output 1700 ml  Net -450 ml   Filed Weights   04/08/20 1324   Weight: 55.3 kg    Examination:  General - alert, cachectic Eyes - pupils reactive ENT - no sinus tenderness, no stridor Cardiac - regular rate/rhythm, no murmur Chest - decreased breath sounds, scattered rhonchi, no wheeze Abdomen - soft, non tender, + bowel sounds Extremities - no cyanosis, clubbing, or edema Skin - no rashes Neuro - moves extremities, follows commands Psych - normal mood and behavior   Resolved Hospital Problem list     Assessment & Plan:   Acute on chronic hypoxic, hypercapnic respiratory failure from COPD and bronchiectasis exacerbation. Severe COPD from emphysema with continued tobacco use. - goal SpO2 90 to 95% - might benefit from NIV as outpt; will discuss more as he gets closer to d/c home - wean steroids as tolerated; might need to keep on low dose steroids chronically - was using breztri as outpt, but I don't think he can effectively HFAs or DPIs; will have him try yupelri, brovana, and pulmicort while in hospital - prn albuterol - day 2 of ABx - check A1AT level - f/u sputum cultures - nicotine patch  Severe protein calorie malnutrition from cachexia in setting of COPD. - seen by registered dietitian  Chronic pain. - per primary team  Sinus tachycardia likely from hypovolemia. - improved with IV fluids - monitor heart rhythm  Goals of care. - seen by palliative care - remains full code status - explained that we need to not  only think about the present, but also plan for the future as his disease progresses  Deconditioning. - pt's mother requested assessment for hospital bed and wheelchair for home use >> defer to primary team  Best practice (evaluated daily)  Diet: Regular DVT prophylaxis: lovenox GI prophylaxis: not indicated Mobility: will need PT/OT assessment Disposition: admit to hospital with [facility] as primary and PCCM as consult Code Status: full code  Labs    CMP Latest Ref Rng & Units 04/09/2020 04/08/2020 09/18/2019   Glucose 70 - 99 mg/dL 139(H) 120(H) 104(H)  BUN 6 - 20 mg/dL 9 8 6   Creatinine 0.61 - 1.24 mg/dL 0.36(L) 0.49(L) 0.71  Sodium 135 - 145 mmol/L 135 133(L) 134(L)  Potassium 3.5 - 5.1 mmol/L 4.4 4.6 3.9  Chloride 98 - 111 mmol/L 97(L) 93(L) 98  CO2 22 - 32 mmol/L 30 29 28   Calcium 8.9 - 10.3 mg/dL 9.0 9.3 9.3  Total Protein 6.5 - 8.1 g/dL - 8.2(H) -  Total Bilirubin 0.3 - 1.2 mg/dL - 0.5 -  Alkaline Phos 38 - 126 U/L - 94 -  AST 15 - 41 U/L - 20 -  ALT 0 - 44 U/L - 15 -    CBC Latest Ref Rng & Units 04/09/2020 04/08/2020 09/18/2019  WBC 4.0 - 10.5 K/uL 11.2(H) 20.8(H) 15.9(H)  Hemoglobin 13.0 - 17.0 g/dL 15.5 16.7 17.1(H)  Hematocrit 39.0 - 52.0 % 51.4 55.0(H) 55.3(H)  Platelets 150 - 400 K/uL 436(H) 505(H) 300    ABG    Component Value Date/Time   PHART 7.338 (L) 01/26/2017 1930   PCO2ART 59.2 (H) 01/26/2017 1930   PO2ART 96.5 01/26/2017 1930   HCO3 29.6 (H) 04/09/2020 0807   TCO2 26 11/13/2017 2132   ACIDBASEDEF 0.1 02/04/2015 1610   O2SAT 96.9 04/09/2020 0807    Signature:  Chesley Mires, MD [place] Pager - [phone] 04/09/2020, 2:23 PM

## 2020-04-09 NOTE — Progress Notes (Signed)
PROGRESS NOTE    SAYER MASINI  DXA:[id number] DOB: 1965-07-22 DOA: 04/08/2020 PCP: Lindell Spar, MD    Brief Narrative:  55 year old male with a history of severe COPD, bronchiectasis, chronic respiratory failure, admitted to the hospital from pulmonology clinic with acute on chronic respiratory failure due to COPD exacerbation of bronchiectasis.  Started on steroids, bronchodilators and antibiotics.   Assessment & Plan:   Active Problems:   Tobacco abuse   Respiratory failure, acute (HCC)   Anxiety state   Malnutrition of moderate degree (HCC)   COPD exacerbation (HCC)   Acute on chronic respiratory failure (HCC)   GERD (gastroesophageal reflux disease)   Chronic pain syndrome   Positive ANA (antinuclear antibody)   Protein-calorie malnutrition, severe   Acute on chronic respiratory failure with hypoxia -Secondary to COPD exacerbation of bronchiectasis -He reports that he chronically wears 2 L of oxygen at home -He normally ambulates independently, but has severely diminished exercise tolerance and tires very quickly  COPD exacerbation -Steroids, bronchodilators biotics -Continues to be wheezing and short of breath -Unfortunately continues to smoke  Cachexia from COPD -Nutrition consult  Chronic pain -Appears to be responding to morphine  Goals of care -Seen by palliative care and currently wishes to remain full code -He is agreeable to have hospice/palliative care services follow as an outpatient   DVT prophylaxis: enoxaparin (LOVENOX) injection 40 mg Start: 04/08/20 1545  Code Status: Full code Family Communication: Discussed with patient Disposition Plan: Status is: Observation  The patient remains OBS appropriate and will d/c before 2 midnights.  Dispo: The patient is from: Home              Anticipated d/c is to: Home              Patient currently is not medically stable to d/c.   Difficult to place patient No         Consultants:    Pulmonology  Palliative care  Procedures:     Antimicrobials:   Azithromycin 3/3>  Ceftriaxone 3/3.    Subjective: Continues to feel short of breath and feels her breathing is not back to baseline.  Has productive cough.  Objective: Vitals:   04/09/20 1453 04/09/20 1648 04/09/20 2016 04/09/20 2048  BP: 111/71   100/76  Pulse: (!) 109   95  Resp:    18  Temp:    97.9 F (36.6 C)  TempSrc:    Oral  SpO2: 92% 94% (!) 89% 96%  Weight:      Height:        Intake/Output Summary (Last 24 hours) at 04/09/2020 2120 Last data filed at 04/09/2020 2051 Gross per 24 hour  Intake 1903.47 ml  Output 1300 ml  Net 603.47 ml   Filed Weights   04/08/20 1324  Weight: 55.3 kg    Examination:  General exam: Appears calm and comfortable  Respiratory system: Diminished breath sounds bilaterally.  Increased respiratory effort. Cardiovascular system: S1 & S2 heard, RRR. No JVD, murmurs, rubs, gallops or clicks. No pedal edema. Gastrointestinal system: Abdomen is nondistended, soft and nontender. No organomegaly or masses felt. Normal bowel sounds heard. Central nervous system: Alert and oriented. No focal neurological deficits. Extremities: Symmetric 5 x 5 power. Skin: No rashes, lesions or ulcers Psychiatry: Judgement and insight appear normal. Mood & affect appropriate.     Data Reviewed: I have personally reviewed following labs and imaging studies  CBC: Recent Labs  Lab 04/08/20 1338 04/09/20 0417  WBC  20.8* 11.2*  NEUTROABS 16.7* 10.0*  HGB 16.7 15.5  HCT 55.0* 51.4  MCV 94.8 94.1  PLT 505* 119*   Basic Metabolic Panel: Recent Labs  Lab 04/08/20 1338 04/09/20 0417  NA 133* 135  K 4.6 4.4  CL 93* 97*  CO2 29 30  GLUCOSE 120* 139*  BUN 8 9  CREATININE 0.49* 0.36*  CALCIUM 9.3 9.0   GFR: Estimated Creatinine Clearance: 81.6 mL/min (A) (by C-G formula based on SCr of 0.36 mg/dL (L)). Liver Function Tests: Recent Labs  Lab 04/08/20 1338  AST 20  ALT  15  ALKPHOS 94  BILITOT 0.5  PROT 8.2*  ALBUMIN 4.0   No results for input(s): LIPASE, AMYLASE in the last 168 hours. No results for input(s): AMMONIA in the last 168 hours. Coagulation Profile: No results for input(s): INR, PROTIME in the last 168 hours. Cardiac Enzymes: No results for input(s): CKTOTAL, CKMB, CKMBINDEX, TROPONINI in the last 168 hours. BNP (last 3 results) No results for input(s): PROBNP in the last 8760 hours. HbA1C: No results for input(s): HGBA1C in the last 72 hours. CBG: No results for input(s): GLUCAP in the last 168 hours. Lipid Profile: No results for input(s): CHOL, HDL, LDLCALC, TRIG, CHOLHDL, LDLDIRECT in the last 72 hours. Thyroid Function Tests: No results for input(s): TSH, T4TOTAL, FREET4, T3FREE, THYROIDAB in the last 72 hours. Anemia Panel: No results for input(s): VITAMINB12, FOLATE, FERRITIN, TIBC, IRON, RETICCTPCT in the last 72 hours. Sepsis Labs: No results for input(s): PROCALCITON, LATICACIDVEN in the last 168 hours.  Recent Results (from the past 240 hour(s))  Resp Panel by RT-PCR (Flu A&B, Covid) Nasopharyngeal Swab     Status: None   Collection Time: 04/08/20  1:45 PM   Specimen: Nasopharyngeal Swab; Nasopharyngeal(NP) swabs in vial transport medium  Result Value Ref Range Status   SARS Coronavirus 2 by RT PCR NEGATIVE NEGATIVE Final    Comment: (NOTE) SARS-CoV-2 target nucleic acids are NOT DETECTED.  The SARS-CoV-2 RNA is generally detectable in upper respiratory specimens during the acute phase of infection. The lowest concentration of SARS-CoV-2 viral copies this assay can detect is 138 copies/mL. A negative result does not preclude SARS-Cov-2 infection and should not be used as the sole basis for treatment or other patient management decisions. A negative result may occur with  improper specimen collection/handling, submission of specimen other than nasopharyngeal swab, presence of viral mutation(s) within the areas  targeted by this assay, and inadequate number of viral copies(<138 copies/mL). A negative result must be combined with clinical observations, patient history, and epidemiological information. The expected result is Negative.  Fact Sheet for Patients:  EntrepreneurPulse.com.au  Fact Sheet for Healthcare Providers:  IncredibleEmployment.be  This test is no t yet approved or cleared by the [place] FDA and  has been authorized for detection and/or diagnosis of SARS-CoV-2 by FDA under an Emergency Use Authorization (EUA). This EUA will remain  in effect (meaning this test can be used) for the duration of the COVID-19 declaration under Section 564(b)(1) of the Act, 21 U.S.C.section 360bbb-3(b)(1), unless the authorization is terminated  or revoked sooner.       Influenza A by PCR NEGATIVE NEGATIVE Final   Influenza B by PCR NEGATIVE NEGATIVE Final    Comment: (NOTE) The Xpert Xpress SARS-CoV-2/FLU/RSV plus assay is intended as an aid in the diagnosis of influenza from Nasopharyngeal swab specimens and should not be used as a sole basis for treatment. Nasal washings and aspirates are unacceptable for Xpert  Xpress SARS-CoV-2/FLU/RSV testing.  Fact Sheet for Patients: EntrepreneurPulse.com.au  Fact Sheet for Healthcare Providers: IncredibleEmployment.be  This test is not yet approved or cleared by the [place] FDA and has been authorized for detection and/or diagnosis of SARS-CoV-2 by FDA under an Emergency Use Authorization (EUA). This EUA will remain in effect (meaning this test can be used) for the duration of the COVID-19 declaration under Section 564(b)(1) of the Act, 21 U.S.C. section 360bbb-3(b)(1), unless the authorization is terminated or revoked.  Performed at [facility], [address] [place], [place] [address]   Culture, sputum-assessment     Status: None (Preliminary result)    Collection Time: 04/09/20  8:00 AM   Specimen: Sputum  Result Value Ref Range Status   Specimen Description SPUTUM  Final   Special Requests Normal  Final   Sputum evaluation   Final    THIS SPECIMEN IS ACCEPTABLE FOR SPUTUM CULTURE Performed at [facility], [address], [place], [place] [address]    Report Status PENDING  Incomplete         Radiology Studies: DG Chest Port 1 View  Result Date: 04/08/2020 CLINICAL DATA:  Shortness of breath, increasing shortness of breath. EXAM: PORTABLE CHEST 1 VIEW COMPARISON:  Chest x-rays dated 09/18/2019 in 11/24/2017. FINDINGS: Heart size and mediastinal contours appear stable. Lungs are hyperexpanded. Coarse lung markings are again seen bilaterally, not appreciably changed. Chronic bronchitic changes noted centrally. Presumed emphysematous blebs at the lung apices, RIGHT greater than LEFT. No pleural effusion or pneumothorax is seen. No acute appearing osseous abnormality. IMPRESSION: 1. No active disease. No evidence of pneumonia or pulmonary edema. 2. Hyperexpanded lungs indicating COPD. Associated chronic bronchitic changes and probable chronic interstitial lung disease. Electronically Signed   By: Franki Cabot M.D.   On: 04/08/2020 14:18        Scheduled Meds: . (feeding supplement) PROSource Plus  30 mL Oral BID BM  . acetaminophen  650 mg Oral QID  . arformoterol  15 mcg Nebulization BID  . azithromycin  500 mg Oral Daily  . budesonide (PULMICORT) nebulizer solution  0.5 mg Nebulization BID  . celecoxib  200 mg Oral BID  . cyclobenzaprine  10 mg Oral BID  . enoxaparin (LOVENOX) injection  40 mg Subcutaneous Q24H  . feeding supplement  237 mL Oral TID BM  . gabapentin  300 mg Oral TID  . nicotine  14 mg Transdermal Daily  . [START ON 04/10/2020] predniSONE  40 mg Oral Q breakfast  . revefenacin  175 mcg Nebulization Daily  . traZODone  50 mg Oral QHS   Continuous Infusions: . cefTRIAXone (ROCEPHIN)  IV 1 g (04/09/20 1243)  .  dextrose 5 % and 0.45% NaCl 50 mL/hr at 04/09/20 1239     LOS: 0 days    Time spent: 39mins    Kathie Dike, MD [facility]   If 7PM-7AM, please contact night-coverage [email or web address]  04/09/2020, 9:20 PM

## 2020-04-09 NOTE — TOC Initial Note (Signed)
Transition of Care [facility]) - Initial/Assessment Note   Patient Details  Name: Cameron Villarreal MRN: [id number] Date of Birth: [birth year]  Transition of [facility]) CM/SW Contact:    Sherie Don, LCSW Phone Number: 04/09/2020, 2:18 PM  Clinical Narrative: Patient is a 55 year old male who is under observation for tobacco abuse. Patient has a history of malnutrition, COPD, chronic respiratory failure, GERD, and chronic pain syndrome. TOC notified patient agreeable to OP palliative referral. CSW spoke with patient and patient now no longer wants to be referred for palliative services. Patient stated, "I can manage on my own." CSW updated palliative NP and hospitalist. TOC to follow for discharge needs.  Expected Discharge Plan: Home/Self Care Barriers to Discharge: Continued Medical Work up  Patient Goals and CMS Choice Patient states their goals for this hospitalization and ongoing recovery are:: Return home CMS Medicare.gov Compare Post Acute Care list provided to:: Patient Choice offered to / list presented to : Patient  Expected Discharge Plan and Services Expected Discharge Plan: Home/Self Care In-house Referral: Clinical Social Work Post Acute Care Choice: NA Living arrangements for the past 2 months: Single Family Home  Prior Living Arrangements/Services Living arrangements for the past 2 months: Single Family Home Lives with:: Parents Patient language and need for interpreter reviewed:: Yes Do you feel safe going back to the place where you live?: Yes      Need for Family Participation in Patient Care: Yes (Comment) Care giver support system in place?: Yes (comment) Criminal Activity/Legal Involvement Pertinent to Current Situation/Hospitalization: No - Comment as needed  Activities of Daily Living Home Assistive Devices/Equipment: Oxygen ADL Screening (condition at time of admission) Patient's cognitive ability adequate to safely complete daily activities?: Yes Is the patient  deaf or have difficulty hearing?: No Does the patient have difficulty seeing, even when wearing glasses/contacts?: No Does the patient have difficulty concentrating, remembering, or making decisions?: No Patient able to express need for assistance with ADLs?: Yes Does the patient have difficulty dressing or bathing?: Yes Independently performs ADLs?: Yes (appropriate for developmental age) Does the patient have difficulty walking or climbing stairs?: Yes Weakness of Legs: Both Weakness of Arms/Hands: Both  Emotional Assessment Appearance:: Appears older than stated age Attitude/Demeanor/Rapport: Guarded Affect (typically observed): Apprehensive Orientation: : Oriented to Self,Oriented to Place,Oriented to  Time,Oriented to Situation Alcohol / Substance Use: Tobacco Use Psych Involvement: No (comment)  Admission diagnosis:  COPD exacerbation ([place]) [J44.1] Acute on chronic respiratory failure ([place]) [J96.20] Patient Active Problem List   Diagnosis Date Noted  . Positive ANA (antinuclear antibody) 02/20/2020  . Bronchiectasis with acute exacerbation ([place]) 10/07/2019  . Chronic obstructive pulmonary disease, unspecified ([place])   . Low back pain   . Anxiety disorder, unspecified   . Insomnia   . Other specified disorders of teeth and supporting structures   . Unilateral inguinal hernia, without obstruction or gangrene, not specified as recurrent   . Vitamin D insufficiency 04/16/2018  . Elevated sed rate 04/16/2018  . Elevated C-reactive protein (CRP) 04/16/2018  . Chronic elbow pain, right (Primary Area of Pain) 04/12/2018  . Wrist pain, chronic, right (Secondary Area of Pain) 04/12/2018  . Chronic pain syndrome 04/12/2018  . Chronic pain of right upper extremity (Tertiary Area of Pain) 04/12/2018  . Encounter to establish care 04/12/2018  . Disorder of skeletal system 04/12/2018  . Disp fx of right radial styloid process, init for clos fx 12/14/2017  . Fracture of right ulnar  styloid 12/14/2017  . Closed  fracture of right distal humerus 11/16/2017  . Drug abuse ([place]) 01/30/2017  . GERD (gastroesophageal reflux disease) 01/26/2017  . Hypercholesterolemia 01/26/2017  . Elevated brain natriuretic peptide (BNP) level 01/26/2017  . Elevated troponin 01/26/2017  . Anxiety 01/26/2017  . Thrombocytosis 01/26/2017  . Acute on chronic respiratory failure ([place]) 02/04/2015  . COPD exacerbation ([place]) 12/25/2014  . Malnutrition of moderate degree ([place]) 07/03/2014  . Polycythemia 07/01/2014  . Anxiety state 06/24/2013  . Chronic back pain greater than 3 months duration 06/24/2013  . Respiratory failure, acute ([place]) 06/22/2013  . Pulmonary nodule 07/04/2011  . Hyperglycemia, drug-induced 07/04/2011  . Tobacco abuse 07/02/2011   PCP:  Lindell Spar, MD Pharmacy:   [facility], [place] [address] Phone: [phone] Fax: [phone]  [place], [place] - [address] [place] [address] [place] [address] Phone: [phone] Fax: [phone]  [facility], [place] [address] [place] [address] Phone: [phone] Fax: [phone]  [place], [place] [address] [place] [address] Phone: [phone] Fax: [phone]  Readmission Risk Interventions No flowsheet data found.

## 2020-04-09 NOTE — Care Management Obs Status (Signed)
[place] NOTIFICATION   Patient Details  Name: Cameron Villarreal MRN: [id number] Date of Birth: 1966/01/25   Medicare Observation Status Notification Given:  Yes    Tommy Medal 04/09/2020, 3:49 PM

## 2020-04-10 DIAGNOSIS — Z79891 Long term (current) use of opiate analgesic: Secondary | ICD-10-CM | POA: Diagnosis not present

## 2020-04-10 DIAGNOSIS — Z9981 Dependence on supplemental oxygen: Secondary | ICD-10-CM | POA: Diagnosis not present

## 2020-04-10 DIAGNOSIS — Z7952 Long term (current) use of systemic steroids: Secondary | ICD-10-CM | POA: Diagnosis not present

## 2020-04-10 DIAGNOSIS — E861 Hypovolemia: Secondary | ICD-10-CM | POA: Diagnosis present

## 2020-04-10 DIAGNOSIS — J9622 Acute and chronic respiratory failure with hypercapnia: Secondary | ICD-10-CM | POA: Diagnosis not present

## 2020-04-10 DIAGNOSIS — J471 Bronchiectasis with (acute) exacerbation: Secondary | ICD-10-CM | POA: Diagnosis not present

## 2020-04-10 DIAGNOSIS — J209 Acute bronchitis, unspecified: Secondary | ICD-10-CM | POA: Diagnosis present

## 2020-04-10 DIAGNOSIS — R768 Other specified abnormal immunological findings in serum: Secondary | ICD-10-CM | POA: Diagnosis present

## 2020-04-10 DIAGNOSIS — Z79899 Other long term (current) drug therapy: Secondary | ICD-10-CM | POA: Diagnosis not present

## 2020-04-10 DIAGNOSIS — E43 Unspecified severe protein-calorie malnutrition: Secondary | ICD-10-CM | POA: Diagnosis not present

## 2020-04-10 DIAGNOSIS — G894 Chronic pain syndrome: Secondary | ICD-10-CM | POA: Diagnosis not present

## 2020-04-10 DIAGNOSIS — Z681 Body mass index (BMI) 19 or less, adult: Secondary | ICD-10-CM | POA: Diagnosis not present

## 2020-04-10 DIAGNOSIS — Z66 Do not resuscitate: Secondary | ICD-10-CM | POA: Diagnosis not present

## 2020-04-10 DIAGNOSIS — Z7189 Other specified counseling: Secondary | ICD-10-CM | POA: Diagnosis not present

## 2020-04-10 DIAGNOSIS — J470 Bronchiectasis with acute lower respiratory infection: Secondary | ICD-10-CM | POA: Diagnosis present

## 2020-04-10 DIAGNOSIS — Z885 Allergy status to narcotic agent status: Secondary | ICD-10-CM | POA: Diagnosis not present

## 2020-04-10 DIAGNOSIS — F411 Generalized anxiety disorder: Secondary | ICD-10-CM | POA: Diagnosis present

## 2020-04-10 DIAGNOSIS — R64 Cachexia: Secondary | ICD-10-CM | POA: Diagnosis not present

## 2020-04-10 DIAGNOSIS — E7800 Pure hypercholesterolemia, unspecified: Secondary | ICD-10-CM | POA: Diagnosis present

## 2020-04-10 DIAGNOSIS — F17210 Nicotine dependence, cigarettes, uncomplicated: Secondary | ICD-10-CM | POA: Diagnosis present

## 2020-04-10 DIAGNOSIS — Z515 Encounter for palliative care: Secondary | ICD-10-CM | POA: Diagnosis not present

## 2020-04-10 DIAGNOSIS — Z20822 Contact with and (suspected) exposure to covid-19: Secondary | ICD-10-CM | POA: Diagnosis not present

## 2020-04-10 DIAGNOSIS — K219 Gastro-esophageal reflux disease without esophagitis: Secondary | ICD-10-CM | POA: Diagnosis not present

## 2020-04-10 DIAGNOSIS — Z7951 Long term (current) use of inhaled steroids: Secondary | ICD-10-CM | POA: Diagnosis not present

## 2020-04-10 DIAGNOSIS — J441 Chronic obstructive pulmonary disease with (acute) exacerbation: Secondary | ICD-10-CM | POA: Diagnosis not present

## 2020-04-10 DIAGNOSIS — J9621 Acute and chronic respiratory failure with hypoxia: Secondary | ICD-10-CM | POA: Diagnosis not present

## 2020-04-10 LAB — BASIC METABOLIC PANEL
Anion gap: 7 (ref 5–15)
BUN: 12 mg/dL (ref 6–20)
CO2: 30 mmol/L (ref 22–32)
Calcium: 9 mg/dL (ref 8.9–10.3)
Chloride: 98 mmol/L (ref 98–111)
Creatinine, Ser: 0.48 mg/dL — ABNORMAL LOW (ref 0.61–1.24)
GFR, Estimated: >60 mL/min (ref >60)
Glucose, Bld: 147 mg/dL — ABNORMAL HIGH (ref 70–99)
Potassium: 4 mmol/L (ref 3.5–5.1)
Sodium: 135 mmol/L (ref 135–145)

## 2020-04-10 LAB — CBC
HCT: 45.5 % (ref 39.0–52.0)
Hemoglobin: 13.7 g/dL (ref 13.0–17.0)
MCH: 28.9 pg (ref 26.0–34.0)
MCHC: 30.1 g/dL (ref 30.0–36.0)
MCV: 96 fL (ref 80.0–100.0)
Platelets: 398 10*3/uL (ref 150–400)
RBC: 4.74 MIL/uL (ref 4.22–5.81)
RDW: 14.2 % (ref 11.5–15.5)
WBC: 21.2 10*3/uL — ABNORMAL HIGH (ref 4.0–10.5)
nRBC: 0 % (ref 0.0–0.2)

## 2020-04-10 MED ORDER — CYCLOBENZAPRINE HCL 10 MG PO TABS
10.0000 mg | ORAL_TABLET | Freq: Three times a day (TID) | ORAL | Status: DC
  Administered 2020-04-10 – 2020-04-14 (×13): 10 mg via ORAL
  Filled 2020-04-10 (×13): qty 1

## 2020-04-10 MED ORDER — METHYLPREDNISOLONE SODIUM SUCC 125 MG IJ SOLR
60.0000 mg | Freq: Two times a day (BID) | INTRAMUSCULAR | Status: DC
  Administered 2020-04-10 – 2020-04-14 (×8): 60 mg via INTRAVENOUS
  Filled 2020-04-10 (×8): qty 2

## 2020-04-10 NOTE — Progress Notes (Signed)
Physical Therapy Treatment Patient Details Name: Cameron Villarreal MRN: [id number] DOB: Oct 03, 1965 Today's Date: 04/10/2020    History of Present Illness Cameron Villarreal is a 55 y.o. male with medical history significant of advanced COPD - emphysema, chronic bronchitis, bronchiectasis on home oxygen; GERD, Anxiety, chronic pain with skeletal abnormality and protein-calorie malnutrition. He presented to pulmonary clinic, Dr. Halford Chessman, today having more cough and sputum,  Bringing up brown phlegm.  Doesn't feel like he can get enough air into his lungs.  Feels tight in his chest.  Needing increased amounts of oxygen at home.  Ran out of prednisone - wasn't sure he was supposed to stay on prednisone.  Not awrare of fever.  Denies hemoptysis.  Not much appetite.  Feeling weaker. He was instructed to present to [facility] for evaluation and admission.    PT Comments    Patient agreeable for therapy and demonstrates good return for bed mobility, limited to completing a few BLE exercises while seated at bedside before becoming very SOB and had to lie back down.  Patient declined to attempt transfers due to difficulty breathing.  Patient will benefit from continued physical therapy in hospital and recommended venue below to increase strength, balance, endurance for safe ADLs and gait.   Follow Up Recommendations  No PT follow up     Equipment Recommendations  None recommended by PT    Recommendations for Other Services       Precautions / Restrictions Precautions Precautions: None Restrictions Weight Bearing Restrictions: No    Mobility  Bed Mobility Overal bed mobility: Modified Independent             General bed mobility comments: slightly labored    Transfers                    Ambulation/Gait                 Stairs             Wheelchair Mobility    Modified Rankin (Stroke Patients Only)       Balance Overall balance assessment: Needs  assistance Sitting-balance support: Feet supported;No upper extremity supported Sitting balance-Leahy Scale: Good Sitting balance - Comments: seated at EOB                                    Cognition Arousal/Alertness: Awake/alert Behavior During Therapy: WFL for tasks assessed/performed Overall Cognitive Status: Within Functional Limits for tasks assessed                                        Exercises General Exercises - Lower Extremity Ankle Circles/Pumps: Seated;AROM;Strengthening;Both;10 reps Long Arc Quad: Seated;AROM;Strengthening;Both;10 reps    General Comments        Pertinent Vitals/Pain Pain Assessment: No/denies pain    Home Living                      Prior Function            PT Goals (current goals can now be found in the care plan section) Acute Rehab PT Goals Patient Stated Goal: Return home. PT Goal Formulation: With patient Time For Goal Achievement: 04/16/20 Potential to Achieve Goals: Good Progress towards PT goals: Progressing toward goals    Frequency  Min 2X/week      PT Plan Current plan remains appropriate    Co-evaluation              AM-PAC PT "6 Clicks" Mobility   Outcome Measure  Help needed turning from your back to your side while in a flat bed without using bedrails?: None Help needed moving from lying on your back to sitting on the side of a flat bed without using bedrails?: None Help needed moving to and from a bed to a chair (including a wheelchair)?: None Help needed standing up from a chair using your arms (e.g., wheelchair or bedside chair)?: None Help needed to walk in hospital room?: A Little Help needed climbing 3-5 steps with a railing? : A Lot 6 Click Score: 21    End of Session Equipment Utilized During Treatment: Oxygen Activity Tolerance: Patient tolerated treatment well;Patient limited by fatigue;Other (comment) (Patient limited by SOB) Patient left: in  bed Nurse Communication: Mobility status PT Visit Diagnosis: Unsteadiness on feet (R26.81);Other abnormalities of gait and mobility (R26.89);Muscle weakness (generalized) (M62.81)     Time: 7209-4709 PT Time Calculation (min) (ACUTE ONLY): 10 min  Charges:  $Therapeutic Activity: 8-22 mins                     1:48 PM, 04/10/20 Lonell Grandchild, MPT Physical Therapist with [facility] [phone] office [phone] mobile phone

## 2020-04-10 NOTE — Progress Notes (Addendum)
NAME:  Cameron Villarreal, MRN:  [id number], DOB:  1965-03-28, LOS: 0 ADMISSION DATE:  04/08/2020, CONSULTATION DATE:  04/08/2020 REFERRING MD:  Dr. Melina Copa, ER, CHIEF COMPLAINT:  Short of breath   Brief History:  55 yo male smoker sent from pulmonary office for admission due to COPD exacerbation.  History of Present Illness:  He has severe COPD with emphysema and bronchiectasis and continues to smoke.  He had lung cancer screening CT chest that showed evidence for possible atypical infection such as MAI.  He has more cough with brown sputum, chest tightness, wheezing, fatigue, and weakness.  Needing increased O2 levels at home.  Seen in pulmonary office earlier today and decision made to send to ER for hospital admission.  Received solumedrol, morphine, magnesium, antibiotics, and IV fluid bolus in ER.  Notes improvement after therapy.  Still having dyspnea and chest tightness, but feels air getting into lung better.  Past Medical History:  Anxiety, GERD, HLD, Insomnia, Back pain, Pneumonia, ANA positive  Significant Hospital Events:  3/02 admit 3/03 change to nebulized LAMA/LABA/ICS  Consults:  Palliative care  Procedures:    Significant Diagnostic Tests:   LDCT 03/16/20 >> numerous b/l nodules with tree in bud, new 8.8 mm nodule RLL, new 7.9 mm nodule LLL   Micro Data:  COVID 3/02 >> negative Flu PCR 3/02 >> negative Sputum 3/02 >> Sputum AFB 3/02 >>  Antimicrobials:  Rocephin 3/02 >> Zithromax 3/02 >>    Interim History / Subjective:  Still feels weak.  Cough and sputum easing up some.  Winded with any activity.  Objective   BP 110/81 (BP Location: Left Arm)   Pulse 98   Temp 97.7 F (36.5 C) (Oral)   Resp 20   Ht 5\' 8"  (1.727 m)   Wt 55.3 kg   SpO2 96%   BMI 18.55 kg/m  2.5 liters oxygen.  I/O last 3 completed shifts: In: 2023.5 [P.O.:840; I.V.:1183.5] Out: 2225 [Urine:2225]  Examination:  General - alert, cachectic Eyes - pupils reactive ENT - no sinus  tenderness, no stridor Cardiac - regular rate/rhythm, no murmur Chest - decreased BS, no wheeze Abdomen - soft, non tender, + bowel sounds Extremities - no cyanosis, clubbing, or edema Skin - no rashes Neuro - moves extremities, follows commands Psych - normal mood and behavior   Resolved Hospital Problem list     Assessment & Plan:   Acute on chronic hypoxic, hypercapnic respiratory failure from COPD and bronchiectasis exacerbation. Severe COPD from emphysema with continued tobacco use. - goal SpO2 90 to 95% - continue yupelri, brovana, pulmicort - albuterol q4h prn - might benefit from NIV as outpt; will discuss more as he gets closer to d/c home - wean steroids as tolerated; might need to keep on low dose steroids chronically - day 3 of ABx - f/u A1AT level from 3/04 - f/u sputum cultures - nicotine patch  Severe protein calorie malnutrition from cachexia in setting of COPD. - seen by registered dietitian  Chronic pain. - per primary team  Positive DS DNA antibody from 04/08/20. - per primary team  Sinus tachycardia likely from hypovolemia. - improved with IV fluids - monitor heart rhythm  Goals of care. - seen by palliative care - remains full code status - explained that we need to not only think about the present, but also plan for the future as his disease progresses  Deconditioning. - pt's mother requested assessment for hospital bed and wheelchair for home use >> defer to  primary team  PCCM will f/u on Monday, 04/13/20 if he remains in hospital over the weekend.  Updated his mother at bedside.  Best practice (evaluated daily)  Diet: Regular DVT prophylaxis: lovenox GI prophylaxis: not indicated Mobility: will need PT/OT assessment Disposition: admit to hospital with [facility] as primary and PCCM as consult Code Status: full code  Labs    CMP Latest Ref Rng & Units 04/10/2020 04/09/2020 04/08/2020  Glucose 70 - 99 mg/dL 147(H) 139(H) 120(H)  BUN 6 - 20  mg/dL 12 9 8   Creatinine 0.61 - 1.24 mg/dL 0.48(L) 0.36(L) 0.49(L)  Sodium 135 - 145 mmol/L 135 135 133(L)  Potassium 3.5 - 5.1 mmol/L 4.0 4.4 4.6  Chloride 98 - 111 mmol/L 98 97(L) 93(L)  CO2 22 - 32 mmol/L 30 30 29   Calcium 8.9 - 10.3 mg/dL 9.0 9.0 9.3  Total Protein 6.5 - 8.1 g/dL - - 8.2(H)  Total Bilirubin 0.3 - 1.2 mg/dL - - 0.5  Alkaline Phos 38 - 126 U/L - - 94  AST 15 - 41 U/L - - 20  ALT 0 - 44 U/L - - 15    CBC Latest Ref Rng & Units 04/10/2020 04/09/2020 04/08/2020  WBC 4.0 - 10.5 K/uL 21.2(H) 11.2(H) 20.8(H)  Hemoglobin 13.0 - 17.0 g/dL 13.7 15.5 16.7  Hematocrit 39.0 - 52.0 % 45.5 51.4 55.0(H)  Platelets 150 - 400 K/uL 398 436(H) 505(H)    ABG    Component Value Date/Time   PHART 7.338 (L) 01/26/2017 1930   PCO2ART 59.2 (H) 01/26/2017 1930   PO2ART 96.5 01/26/2017 1930   HCO3 29.6 (H) 04/09/2020 0807   TCO2 26 11/13/2017 2132   ACIDBASEDEF 0.1 02/04/2015 1610   O2SAT 96.9 04/09/2020 0807    Signature:  Chesley Mires, MD [place] Pager - [phone] 04/10/2020, 2:47 PM

## 2020-04-10 NOTE — Progress Notes (Signed)
PROGRESS NOTE    Cameron Villarreal  POE:[id number] DOB: [birth year] DOA: 04/08/2020 PCP: Lindell Spar, MD    Brief Narrative:  55 year old male with a history of severe COPD, bronchiectasis, chronic respiratory failure, admitted to the hospital from pulmonology clinic with acute on chronic respiratory failure due to COPD exacerbation of bronchiectasis.  Started on steroids, bronchodilators and antibiotics.   Assessment & Plan:   Active Problems:   Tobacco abuse   Respiratory failure, acute (HCC)   Anxiety state   Malnutrition of moderate degree (HCC)   COPD exacerbation (HCC)   Acute on chronic respiratory failure (HCC)   GERD (gastroesophageal reflux disease)   Chronic pain syndrome   Positive ANA (antinuclear antibody)   Protein-calorie malnutrition, severe   Acute on chronic respiratory failure with hypoxia -Secondary to COPD exacerbation of bronchiectasis -He reports that he chronically wears 2 L of oxygen at home -He normally ambulates independently, but has severely diminished exercise tolerance and tires very quickly  COPD exacerbation -Steroids, bronchodilators, antibiotics -Still sounds tight with wheezing and increased work of breathing -Change prednisone to Solu-Medrol -Unfortunately continues to smoke  Cachexia from COPD -Nutrition consult  Chronic pain -Appears to be responding to morphine  Goals of care -Seen by palliative care and currently wishes to remain full code -He is agreeable to have hospice/palliative care services follow as an outpatient and has elected [facility] hospice   DVT prophylaxis: enoxaparin (LOVENOX) injection 40 mg Start: 04/08/20 1545  Code Status: Full code Family Communication: Discussed with patient Disposition Plan: Status is: Observation  The patient remains OBS appropriate and will d/c before 2 midnights.  Dispo: The patient is from: Home              Anticipated d/c is to: Home              Patient currently is not  medically stable to d/c.   Difficult to place patient No    Consultants:   Pulmonology  Palliative care  Procedures:     Antimicrobials:   Azithromycin 3/3>  Ceftriaxone 3/3>    Subjective: Feels minimal improvement in his breathing, does not feel back to baseline yet.  Continues to have productive cough.  Objective: Vitals:   04/10/20 0819 04/10/20 0900 04/10/20 1229 04/10/20 1306  BP:    110/81  Pulse:    98  Resp:    20  Temp:    97.7 F (36.5 C)  TempSrc:    Oral  SpO2: 98% 95% (!) 88% 96%  Weight:      Height:        Intake/Output Summary (Last 24 hours) at 04/10/2020 2006 Last data filed at 04/10/2020 1700 Gross per 24 hour  Intake 800 ml  Output 2525 ml  Net -1725 ml   Filed Weights   04/08/20 1324  Weight: 55.3 kg    Examination:  General exam: Alert, awake, oriented x 3 Respiratory system: Wheezing mildly improved bilateral air entry.  Still has some increased work of breathing Cardiovascular system:RRR. No murmurs, rubs, gallops. Gastrointestinal system: Abdomen is nondistended, soft and nontender. No organomegaly or masses felt. Normal bowel sounds heard. Central nervous system: Alert and oriented. No focal neurological deficits. Extremities: No C/C/E, +pedal pulses Skin: No rashes, lesions or ulcers Psychiatry: Judgement and insight appear normal. Mood & affect appropriate.      Data Reviewed: I have personally reviewed following labs and imaging studies  CBC: Recent Labs  Lab 04/08/20 1338 04/09/20 0417 04/10/20 0504  WBC 20.8* 11.2* 21.2*  NEUTROABS 16.7* 10.0*  --   HGB 16.7 15.5 13.7  HCT 55.0* 51.4 45.5  MCV 94.8 94.1 96.0  PLT 505* 436* 962   Basic Metabolic Panel: Recent Labs  Lab 04/08/20 1338 04/09/20 0417 04/10/20 0504  NA 133* 135 135  K 4.6 4.4 4.0  CL 93* 97* 98  CO2 29 30 30   GLUCOSE 120* 139* 147*  BUN 8 9 12   CREATININE 0.49* 0.36* 0.48*  CALCIUM 9.3 9.0 9.0   GFR: Estimated Creatinine Clearance:  81.6 mL/min (A) (by C-G formula based on SCr of 0.48 mg/dL (L)). Liver Function Tests: Recent Labs  Lab 04/08/20 1338  AST 20  ALT 15  ALKPHOS 94  BILITOT 0.5  PROT 8.2*  ALBUMIN 4.0   No results for input(s): LIPASE, AMYLASE in the last 168 hours. No results for input(s): AMMONIA in the last 168 hours. Coagulation Profile: No results for input(s): INR, PROTIME in the last 168 hours. Cardiac Enzymes: No results for input(s): CKTOTAL, CKMB, CKMBINDEX, TROPONINI in the last 168 hours. BNP (last 3 results) No results for input(s): PROBNP in the last 8760 hours. HbA1C: No results for input(s): HGBA1C in the last 72 hours. CBG: No results for input(s): GLUCAP in the last 168 hours. Lipid Profile: No results for input(s): CHOL, HDL, LDLCALC, TRIG, CHOLHDL, LDLDIRECT in the last 72 hours. Thyroid Function Tests: No results for input(s): TSH, T4TOTAL, FREET4, T3FREE, THYROIDAB in the last 72 hours. Anemia Panel: No results for input(s): VITAMINB12, FOLATE, FERRITIN, TIBC, IRON, RETICCTPCT in the last 72 hours. Sepsis Labs: No results for input(s): PROCALCITON, LATICACIDVEN in the last 168 hours.  Recent Results (from the past 240 hour(s))  Resp Panel by RT-PCR (Flu A&B, Covid) Nasopharyngeal Swab     Status: None   Collection Time: 04/08/20  1:45 PM   Specimen: Nasopharyngeal Swab; Nasopharyngeal(NP) swabs in vial transport medium  Result Value Ref Range Status   SARS Coronavirus 2 by RT PCR NEGATIVE NEGATIVE Final    Comment: (NOTE) SARS-CoV-2 target nucleic acids are NOT DETECTED.  The SARS-CoV-2 RNA is generally detectable in upper respiratory specimens during the acute phase of infection. The lowest concentration of SARS-CoV-2 viral copies this assay can detect is 138 copies/mL. A negative result does not preclude SARS-Cov-2 infection and should not be used as the sole basis for treatment or other patient management decisions. A negative result may occur with  improper  specimen collection/handling, submission of specimen other than nasopharyngeal swab, presence of viral mutation(s) within the areas targeted by this assay, and inadequate number of viral copies(<138 copies/mL). A negative result must be combined with clinical observations, patient history, and epidemiological information. The expected result is Negative.  Fact Sheet for Patients:  EntrepreneurPulse.com.au  Fact Sheet for Healthcare Providers:  IncredibleEmployment.be  This test is no t yet approved or cleared by the [place] FDA and  has been authorized for detection and/or diagnosis of SARS-CoV-2 by FDA under an Emergency Use Authorization (EUA). This EUA will remain  in effect (meaning this test can be used) for the duration of the COVID-19 declaration under Section 564(b)(1) of the Act, 21 U.S.C.section 360bbb-3(b)(1), unless the authorization is terminated  or revoked sooner.       Influenza A by PCR NEGATIVE NEGATIVE Final   Influenza B by PCR NEGATIVE NEGATIVE Final    Comment: (NOTE) The Xpert Xpress SARS-CoV-2/FLU/RSV plus assay is intended as an aid in the diagnosis of influenza from Nasopharyngeal swab specimens  and should not be used as a sole basis for treatment. Nasal washings and aspirates are unacceptable for Xpert Xpress SARS-CoV-2/FLU/RSV testing.  Fact Sheet for Patients: EntrepreneurPulse.com.au  Fact Sheet for Healthcare Providers: IncredibleEmployment.be  This test is not yet approved or cleared by the [place] FDA and has been authorized for detection and/or diagnosis of SARS-CoV-2 by FDA under an Emergency Use Authorization (EUA). This EUA will remain in effect (meaning this test can be used) for the duration of the COVID-19 declaration under Section 564(b)(1) of the Act, 21 U.S.C. section 360bbb-3(b)(1), unless the authorization is terminated or revoked.  Performed at  [facility], [address] [place], [place] [address]   Culture, sputum-assessment     Status: None (Preliminary result)   Collection Time: 04/09/20  8:00 AM   Specimen: Sputum  Result Value Ref Range Status   Specimen Description SPUTUM  Final   Special Requests Normal  Final   Sputum evaluation   Final    THIS SPECIMEN IS ACCEPTABLE FOR SPUTUM CULTURE Performed at [facility], [address], [place], [place] [address]    Report Status PENDING  Incomplete  Culture, Respiratory w Gram Stain     Status: None (Preliminary result)   Collection Time: 04/09/20  8:00 AM   Specimen: SPU  Result Value Ref Range Status   Specimen Description   Final    SPUTUM Performed at [facility], [address] [place], [place] [address]    Special Requests   Final    Normal Reflexed from [phone] Performed at [facility], [address] [place], [place] [address]    Gram Stain   Final    RARE WBC PRESENT,BOTH PMN AND MONONUCLEAR FEW SQUAMOUS EPITHELIAL CELLS PRESENT RARE BUDDING YEAST SEEN RARE GRAM POSITIVE COCCI IN PAIRS    Culture   Final    TOO YOUNG TO READ Performed at [facility], [address], [place], [place] [address]    Report Status PENDING  Incomplete         Radiology Studies: No results found.      Scheduled Meds: . (feeding supplement) PROSource Plus  30 mL Oral BID BM  . acetaminophen  650 mg Oral QID  . arformoterol  15 mcg Nebulization BID  . azithromycin  500 mg Oral Daily  . budesonide (PULMICORT) nebulizer solution  0.5 mg Nebulization BID  . celecoxib  200 mg Oral BID  . cyclobenzaprine  10 mg Oral TID  . enoxaparin (LOVENOX) injection  40 mg Subcutaneous Q24H  . feeding supplement  237 mL Oral TID BM  . gabapentin  300 mg Oral TID  . nicotine  14 mg Transdermal Daily  . predniSONE  40 mg Oral Q breakfast  . revefenacin  175 mcg Nebulization Daily  . traZODone  50 mg Oral QHS   Continuous Infusions: . cefTRIAXone (ROCEPHIN)  IV 1 g  (04/10/20 1116)  . dextrose 5 % and 0.45% NaCl 50 mL/hr at 04/10/20 1046     LOS: 0 days    Time spent: 46mins    Kathie Dike, MD [facility]   If 7PM-7AM, please contact night-coverage [email or web address]  04/10/2020, 8:06 PM

## 2020-04-10 NOTE — Evaluation (Signed)
Occupational Therapy Evaluation Patient Details Name: Cameron Villarreal MRN: [id number] DOB: [birth year] Today's Date: 04/10/2020    History of Present Illness Cameron Villarreal is a 55 y.o. male with medical history significant of advanced COPD - emphysema, chronic bronchitis, bronchiectasis on home oxygen; GERD, Anxiety, chronic pain with skeletal abnormality and protein-calorie malnutrition. He presented to pulmonary clinic, Dr. Halford Chessman, today having more cough and sputum,  Bringing up brown phlegm.  Doesn't feel like he can get enough air into his lungs.  Feels tight in his chest.  Needing increased amounts of oxygen at home.  Ran out of prednisone - wasn't sure he was supposed to stay on prednisone.  Not awrare of fever.  Denies hemoptysis.  Not much appetite.  Feeling weaker. He was instructed to present to [facility] for evaluation and admission.   Clinical Impression   Pt agreeable to OT evaluation this date. Pt donning 2 LPM of nasal cannula during session. Able to demonstrate inclined supine to sit bed mobility with MOD I. Pt demonstrated need for Mod I to min guard assist for ambulatory transfer from EOB to sink and back due to mild LOB when first standing from EOB. Pt demonstrated hand washing at the sink with Mod I.  Noted to show sings of SOB when returned back to bed. After time in bed O2 saturation was 95%. Pt not recommended for further acute OT services. Recommended d/c home with assist as needed.   Follow Up Recommendations  No OT follow up    Equipment Recommendations  None recommended by OT           Precautions / Restrictions Precautions Precautions: None Restrictions Weight Bearing Restrictions: No      Mobility Bed Mobility Overal bed mobility: Modified Independent             General bed mobility comments: supine to sit (bed with inclined)    Transfers Overall transfer level: Needs assistance   Transfers: Stand Pivot Transfers;Sit to/from Stand (ambulatory  transfer) Sit to Stand: Modified independent (Device/Increase time) Stand pivot transfers: Min guard       General transfer comment: Slight LOB when standing at first from EOB.    Balance Overall balance assessment: Needs assistance         Standing balance support: No upper extremity supported Standing balance-Leahy Scale: Good Standing balance comment: minimal LOB when first standing.                           ADL either performed or assessed with clinical judgement   ADL Overall ADL's : Modified independent; Needs assistance/impaired     Grooming: Modified independent;wash and dry hands Grooming Details (indicate cue type and reason): Standing at sink to wash hands.                 Toilet Transfer: Magazine features editor Details (indicate cue type and reason): Simulated via ambulatory transfer from EOB to standing at sink and back to bed. Min guard assist due to mild LOB when first standing.                 Vision Baseline Vision/History: No visual deficits                  Pertinent Vitals/Pain Pain Assessment: No/denies pain     Hand Dominance Right   Extremity/Trunk Assessment Upper Extremity Assessment Upper Extremity Assessment: Overall WFL for tasks assessed   Lower Extremity Assessment  Lower Extremity Assessment: Defer to PT evaluation   Cervical / Trunk Assessment Cervical / Trunk Assessment: Normal   Communication Communication Communication: No difficulties   Cognition Arousal/Alertness: Awake/alert Behavior During Therapy: WFL for tasks assessed/performed Overall Cognitive Status: Within Functional Limits for tasks assessed                                                      Home Living Family/patient expects to be discharged to:: Private residence Living Arrangements: Parent Available Help at Discharge: Family;Available 24 hours/day Type of Home: House Home Access: Ramped entrance      Home Layout: One level     Bathroom Shower/Tub: Occupational psychologist: Standard Bathroom Accessibility: Yes   Home Equipment: Shower seat          Prior Functioning/Environment Level of Independence: Independent with assistive device(s)        Comments: household ambulator without AD, home O2 dependent 2 LPM                      OT Goals(Current goals can be found in the care plan section) Acute Rehab OT Goals Patient Stated Goal: Return home.  OT Frequency:      AM-PAC OT "6 Clicks" Daily Activity     Outcome Measure Help from another person eating meals?: None Help from another person taking care of personal grooming?: None Help from another person toileting, which includes using toliet, bedpan, or urinal?: None Help from another person bathing (including washing, rinsing, drying)?: None Help from another person to put on and taking off regular upper body clothing?: None Help from another person to put on and taking off regular lower body clothing?: None 6 Click Score: 24   End of Session Equipment Utilized During Treatment: Oxygen (2 LPM)  Activity Tolerance: Patient tolerated treatment well Patient left: in bed;with call bell/phone within reach  OT Visit Diagnosis: Unsteadiness on feet (R26.81)                Time: 8563-1497 OT Time Calculation (min): 9 min Charges:  OT General Charges $OT Visit: 1 Visit OT Evaluation $OT Eval Low Complexity: 1 Low  Debbi Strandberg OT, MOT   Larey Seat 04/10/2020, 9:39 AM

## 2020-04-10 NOTE — Plan of Care (Signed)

## 2020-04-10 NOTE — TOC Progression Note (Signed)
Transition of Care [facility]) - Progression Note    Patient Details  Name: Cameron Villarreal MRN: [id number] Date of Birth: Nov 01, 1965  [facility]) CM/SW Contact  Natasha Bence, LCSW Phone Number: 04/10/2020, 1:20 PM  Clinical Narrative:    CSW notified that patient now would like OP Palliative with [facility] hosp[facility]. CSW referred patient to [facility] with [facility]. Cassandra agreeable to notify Pia Mau with Palliative Admissions. TOC to follow.    Expected Discharge Plan: Home/Self Care Barriers to Discharge: Continued Medical Work up  Expected Discharge Plan and Services Expected Discharge Plan: Home/Self Care In-house Referral: Clinical Social Work   Post Acute Care Choice: NA Living arrangements for the past 2 months: Single Family Home                                       Social Determinants of Health (SDOH) Interventions    Readmission Risk Interventions No flowsheet data found.

## 2020-04-10 NOTE — TOC Progression Note (Signed)
Transition of Care [facility]) - Progression Note    Patient Details  Name: DESTINY TRICKEY MRN: [id number] Date of Birth: 12-30-1965  Transition of Care [facility]) CM/SW Contact  Natasha Bence, LCSW Phone Number: 04/10/2020, 3:22 PM  Clinical Narrative:    Patient requested Hospital Bed and wheelchair. CSW referred patient to [employer]. Shelia with [employer] reported that hospital bed will be delivered [facility] and is agreeable to provide wheelchair. TOC to follow.   Expected Discharge Plan: Home/Self Care Barriers to Discharge: Continued Medical Work up  Expected Discharge Plan and Services Expected Discharge Plan: Home/Self Care In-house Referral: Clinical Social Work   Post Acute Care Choice: NA Living arrangements for the past 2 months: Single Family Home                                       Social Determinants of Health (SDOH) Interventions    Readmission Risk Interventions No flowsheet data found.

## 2020-04-10 NOTE — Progress Notes (Signed)
Gave patient IS.  Explained usage to patient and informed of frequency.  Patient stated he didn't know about all that when I told him how often to use.  Patient was able to reach 780ml with good patient effort X5.  Patient did not want to do more than 5.

## 2020-04-11 DIAGNOSIS — Z7189 Other specified counseling: Secondary | ICD-10-CM | POA: Diagnosis not present

## 2020-04-11 DIAGNOSIS — Z515 Encounter for palliative care: Secondary | ICD-10-CM | POA: Diagnosis not present

## 2020-04-11 DIAGNOSIS — J9621 Acute and chronic respiratory failure with hypoxia: Secondary | ICD-10-CM | POA: Diagnosis not present

## 2020-04-11 NOTE — Progress Notes (Signed)
PROGRESS NOTE    Cameron Villarreal  POE:[id number] DOB: 1965-12-18 DOA: 04/08/2020 PCP: Lindell Spar, MD    Brief Narrative:  55 year old male with a history of severe COPD, bronchiectasis, chronic respiratory failure, admitted to the hospital from pulmonology clinic with acute on chronic respiratory failure due to COPD exacerbation of bronchiectasis.  Started on steroids, bronchodilators and antibiotics.   Assessment & Plan:   Active Problems:   Tobacco abuse   Respiratory failure, acute (HCC)   Anxiety state   Malnutrition of moderate degree (HCC)   COPD exacerbation (HCC)   Acute on chronic respiratory failure (HCC)   GERD (gastroesophageal reflux disease)   Chronic pain syndrome   Positive ANA (antinuclear antibody)   Protein-calorie malnutrition, severe   Acute on chronic respiratory failure with hypoxia -Secondary to COPD exacerbation of bronchiectasis -He reports that he chronically wears 2 L of oxygen at home -He normally ambulates independently, but has severely diminished exercise tolerance and tires very quickly  COPD exacerbation -Steroids, bronchodilators, antibiotics -Still sounds tight with wheezing and increased work of breathing -Continue Solu-Medrol -Unfortunately continues to smoke  Cachexia from COPD -Nutrition consult  Chronic pain -Appears to be responding to morphine  Positive DS DNA -will need outpatient referral to rheumatology regarding further work up  Goals of care -Seen by palliative care and currently wishes to remain full code -He is agreeable to have hospice/palliative care services follow as an outpatient and has elected [facility] hospice   DVT prophylaxis: enoxaparin (LOVENOX) injection 40 mg Start: 04/08/20 1545  Code Status: Full code Family Communication: Discussed with patient Disposition Plan: Status is: Observation  The patient remains OBS appropriate and will d/c before 2 midnights.  Dispo: The patient is from: Home               Anticipated d/c is to: Home              Patient currently is not medically stable to d/c.   Difficult to place patient No    Consultants:   Pulmonology  Palliative care  Procedures:     Antimicrobials:   Azithromycin 3/3>  Ceftriaxone 3/3>    Subjective: Continues to have shortness of breath and does not feel back to baseline.  Continues to have productive cough.  Overall he feels only minimally improved.  Still becomes very dyspneic on minimal exertion.  Objective: Vitals:   04/11/20 0533 04/11/20 0733 04/11/20 1403 04/11/20 1532  BP: 108/78   133/88  Pulse: 86   (!) 109  Resp:      Temp: (!) 97.5 F (36.4 C)   97.8 F (36.6 C)  TempSrc: Oral   Axillary  SpO2: 98% 93% (!) 85% 93%  Weight:      Height:        Intake/Output Summary (Last 24 hours) at 04/11/2020 1944 Last data filed at 04/11/2020 1800 Gross per 24 hour  Intake 840 ml  Output 2850 ml  Net -2010 ml   Filed Weights   04/08/20 1324  Weight: 55.3 kg    Examination:  General exam: Alert, awake, oriented x 3 Respiratory system: Wheeze bilaterally, rhonchi increased respiratory effort Cardiovascular system:RRR. No murmurs, rubs, gallops. Gastrointestinal system: Abdomen is nondistended, soft and nontender. No organomegaly or masses felt. Normal bowel sounds heard. Central nervous system: Alert and oriented. No focal neurological deficits. Extremities: No C/C/E, +pedal pulses Skin: No rashes, lesions or ulcers Psychiatry: Judgement and insight appear normal. Mood & affect appropriate.  Data Reviewed: I have personally reviewed following labs and imaging studies  CBC: Recent Labs  Lab 04/08/20 1338 04/09/20 0417 04/10/20 0504  WBC 20.8* 11.2* 21.2*  NEUTROABS 16.7* 10.0*  --   HGB 16.7 15.5 13.7  HCT 55.0* 51.4 45.5  MCV 94.8 94.1 96.0  PLT 505* 436* 846   Basic Metabolic Panel: Recent Labs  Lab 04/08/20 1338 04/09/20 0417 04/10/20 0504  NA 133* 135 135  K  4.6 4.4 4.0  CL 93* 97* 98  CO2 29 30 30   GLUCOSE 120* 139* 147*  BUN 8 9 12   CREATININE 0.49* 0.36* 0.48*  CALCIUM 9.3 9.0 9.0   GFR: Estimated Creatinine Clearance: 81.6 mL/min (A) (by C-G formula based on SCr of 0.48 mg/dL (L)). Liver Function Tests: Recent Labs  Lab 04/08/20 1338  AST 20  ALT 15  ALKPHOS 94  BILITOT 0.5  PROT 8.2*  ALBUMIN 4.0   No results for input(s): LIPASE, AMYLASE in the last 168 hours. No results for input(s): AMMONIA in the last 168 hours. Coagulation Profile: No results for input(s): INR, PROTIME in the last 168 hours. Cardiac Enzymes: No results for input(s): CKTOTAL, CKMB, CKMBINDEX, TROPONINI in the last 168 hours. BNP (last 3 results) No results for input(s): PROBNP in the last 8760 hours. HbA1C: No results for input(s): HGBA1C in the last 72 hours. CBG: No results for input(s): GLUCAP in the last 168 hours. Lipid Profile: No results for input(s): CHOL, HDL, LDLCALC, TRIG, CHOLHDL, LDLDIRECT in the last 72 hours. Thyroid Function Tests: No results for input(s): TSH, T4TOTAL, FREET4, T3FREE, THYROIDAB in the last 72 hours. Anemia Panel: No results for input(s): VITAMINB12, FOLATE, FERRITIN, TIBC, IRON, RETICCTPCT in the last 72 hours. Sepsis Labs: No results for input(s): PROCALCITON, LATICACIDVEN in the last 168 hours.  Recent Results (from the past 240 hour(s))  Resp Panel by RT-PCR (Flu A&B, Covid) Nasopharyngeal Swab     Status: None   Collection Time: 04/08/20  1:45 PM   Specimen: Nasopharyngeal Swab; Nasopharyngeal(NP) swabs in vial transport medium  Result Value Ref Range Status   SARS Coronavirus 2 by RT PCR NEGATIVE NEGATIVE Final    Comment: (NOTE) SARS-CoV-2 target nucleic acids are NOT DETECTED.  The SARS-CoV-2 RNA is generally detectable in upper respiratory specimens during the acute phase of infection. The lowest concentration of SARS-CoV-2 viral copies this assay can detect is 138 copies/mL. A negative result does  not preclude SARS-Cov-2 infection and should not be used as the sole basis for treatment or other patient management decisions. A negative result may occur with  improper specimen collection/handling, submission of specimen other than nasopharyngeal swab, presence of viral mutation(s) within the areas targeted by this assay, and inadequate number of viral copies(<138 copies/mL). A negative result must be combined with clinical observations, patient history, and epidemiological information. The expected result is Negative.  Fact Sheet for Patients:  EntrepreneurPulse.com.au  Fact Sheet for Healthcare Providers:  IncredibleEmployment.be  This test is no t yet approved or cleared by the [place] FDA and  has been authorized for detection and/or diagnosis of SARS-CoV-2 by FDA under an Emergency Use Authorization (EUA). This EUA will remain  in effect (meaning this test can be used) for the duration of the COVID-19 declaration under Section 564(b)(1) of the Act, 21 U.S.C.section 360bbb-3(b)(1), unless the authorization is terminated  or revoked sooner.       Influenza A by PCR NEGATIVE NEGATIVE Final   Influenza B by PCR NEGATIVE NEGATIVE Final  Comment: (NOTE) The Xpert Xpress SARS-CoV-2/FLU/RSV plus assay is intended as an aid in the diagnosis of influenza from Nasopharyngeal swab specimens and should not be used as a sole basis for treatment. Nasal washings and aspirates are unacceptable for Xpert Xpress SARS-CoV-2/FLU/RSV testing.  Fact Sheet for Patients: EntrepreneurPulse.com.au  Fact Sheet for Healthcare Providers: IncredibleEmployment.be  This test is not yet approved or cleared by the [place] FDA and has been authorized for detection and/or diagnosis of SARS-CoV-2 by FDA under an Emergency Use Authorization (EUA). This EUA will remain in effect (meaning this test can be used) for the  duration of the COVID-19 declaration under Section 564(b)(1) of the Act, 21 U.S.C. section 360bbb-3(b)(1), unless the authorization is terminated or revoked.  Performed at [facility], [address] [place], [place] [address]   Culture, sputum-assessment     Status: None (Preliminary result)   Collection Time: 04/09/20  8:00 AM   Specimen: Sputum  Result Value Ref Range Status   Specimen Description SPUTUM  Final   Special Requests Normal  Final   Sputum evaluation   Final    THIS SPECIMEN IS ACCEPTABLE FOR SPUTUM CULTURE Performed at [facility], [address] [place], [place] [address]    Report Status PENDING  Incomplete  Culture, Respiratory w Gram Stain     Status: None (Preliminary result)   Collection Time: 04/09/20  8:00 AM   Specimen: SPU  Result Value Ref Range Status   Specimen Description   Final    SPUTUM Performed at [facility], [address] [place], [place] [address]    Special Requests   Final    Normal Reflexed from [phone] Performed at [facility], [address] [place], [place] [address]    Gram Stain   Final    RARE WBC PRESENT,BOTH PMN AND MONONUCLEAR FEW SQUAMOUS EPITHELIAL CELLS PRESENT RARE BUDDING YEAST SEEN RARE GRAM POSITIVE COCCI IN PAIRS    Culture   Final    CULTURE REINCUBATED FOR BETTER GROWTH Performed at [facility], [place] [address] [place],  [address]    Report Status PENDING  Incomplete         Radiology Studies: No results found.      Scheduled Meds: . (feeding supplement) PROSource Plus  30 mL Oral BID BM  . acetaminophen  650 mg Oral QID  . arformoterol  15 mcg Nebulization BID  . azithromycin  500 mg Oral Daily  . budesonide (PULMICORT) nebulizer solution  0.5 mg Nebulization BID  . celecoxib  200 mg Oral BID  . cyclobenzaprine  10 mg Oral TID  . enoxaparin (LOVENOX) injection  40 mg Subcutaneous Q24H  . feeding supplement  237 mL Oral TID BM  . gabapentin  300 mg Oral TID  .  methylPREDNISolone (SOLU-MEDROL) injection  60 mg Intravenous Q12H  . nicotine  14 mg Transdermal Daily  . revefenacin  175 mcg Nebulization Daily  . traZODone  50 mg Oral QHS   Continuous Infusions: . cefTRIAXone (ROCEPHIN)  IV 1 g (04/11/20 1047)  . dextrose 5 % and 0.45% NaCl 50 mL/hr at 04/10/20 1046     LOS: 1 day    Time spent: 41mins    Kathie Dike, MD [facility]   If 7PM-7AM, please contact night-coverage [email or web address]  04/11/2020, 7:44 PM

## 2020-04-11 NOTE — Plan of Care (Signed)
  Problem: Education: Goal: Knowledge of General Education information will improve Description Including pain rating scale, medication(s)/side effects and non-pharmacologic comfort measures Outcome: Progressing   Problem: Health Behavior/Discharge Planning: Goal: Ability to manage health-related needs will improve Outcome: Progressing   

## 2020-04-12 DIAGNOSIS — Z515 Encounter for palliative care: Secondary | ICD-10-CM | POA: Diagnosis not present

## 2020-04-12 DIAGNOSIS — Z7189 Other specified counseling: Secondary | ICD-10-CM | POA: Diagnosis not present

## 2020-04-12 DIAGNOSIS — J9621 Acute and chronic respiratory failure with hypoxia: Secondary | ICD-10-CM | POA: Diagnosis not present

## 2020-04-12 LAB — CULTURE, RESPIRATORY W GRAM STAIN
Culture: NORMAL
Special Requests: NORMAL

## 2020-04-12 NOTE — Progress Notes (Signed)
Pt spo2 85% on 4lpm cann and in distress when I entered room pt grunting. Treatment given spo2 increased to 95% pt states he feels better and now drinking coffee. Nurse informed

## 2020-04-12 NOTE — Progress Notes (Signed)
PROGRESS NOTE    Cameron Villarreal  NWG:[id number] DOB: 13-Jan-1966 DOA: 04/08/2020 PCP: Lindell Spar, MD    Brief Narrative:  55 year old male with a history of severe COPD, bronchiectasis, chronic respiratory failure, admitted to the hospital from pulmonology clinic with acute on chronic respiratory failure due to COPD exacerbation of bronchiectasis.  Started on steroids, bronchodilators and antibiotics.   Assessment & Plan:   Active Problems:   Tobacco abuse   Respiratory failure, acute (HCC)   Anxiety state   Malnutrition of moderate degree (HCC)   COPD exacerbation (HCC)   Acute on chronic respiratory failure (HCC)   GERD (gastroesophageal reflux disease)   Chronic pain syndrome   Positive ANA (antinuclear antibody)   Protein-calorie malnutrition, severe   Acute on chronic respiratory failure with hypoxia -Secondary to COPD exacerbation of bronchiectasis -He reports that he chronically wears 2 L of oxygen at home -He normally ambulates independently, but has severely diminished exercise tolerance and tires very quickly -Currently requires 4 L of oxygen -Pulmonology following  COPD exacerbation -Steroids, bronchodilators, antibiotics -Still sounds tight with wheezing and increased work of breathing -Continue Solu-Medrol -Unfortunately continues to smoke  Cachexia from COPD -Nutrition consult  Chronic pain -Appears to be responding to morphine  Positive DS DNA -will need outpatient referral to rheumatology regarding further work up  Goals of care -Seen by palliative care and currently wishes to remain full code -He is agreeable to have hospice/palliative care services follow as an outpatient and has elected [facility]   DVT prophylaxis: enoxaparin (LOVENOX) injection 40 mg Start: 04/08/20 1545  Code Status: Full code Family Communication: Discussed with patient Disposition Plan: Status is: Inpatient  The patient will require care spanning > 2  midnights and should be moved to inpatient because: Inpatient level of care appropriate due to severity of illness  Dispo: The patient is from: Home              Anticipated d/c is to: Home              Patient currently is not medically stable to d/c.   Difficult to place patient No    Consultants:   Pulmonology  Palliative care  Procedures:     Antimicrobials:   Azithromycin 3/3>  Ceftriaxone 3/3>    Subjective: Continues to have shortness of breath and does not feel back to baseline.  Continues to have productive cough.  Overall he feels only minimally improved.  Still becomes very dyspneic on minimal exertion.  Objective: Vitals:   04/12/20 0526 04/12/20 0733 04/12/20 1439 04/12/20 1833  BP: 91/75  131/84   Pulse: 91  (!) 104   Resp: 18  20   Temp: (!) 97 F (36.1 C)  98.1 F (36.7 C)   TempSrc:   Oral   SpO2: 98% 93% 99% 90%  Weight:      Height:        Intake/Output Summary (Last 24 hours) at 04/12/2020 2017 Last data filed at 04/12/2020 1700 Gross per 24 hour  Intake 1080 ml  Output 4050 ml  Net -2970 ml   Filed Weights   04/08/20 1324  Weight: 55.3 kg    Examination:  General exam: Alert, awake, oriented x 3 Respiratory system: Wheeze bilaterally, rhonchi increased respiratory effort Cardiovascular system:RRR. No murmurs, rubs, gallops. Gastrointestinal system: Abdomen is nondistended, soft and nontender. No organomegaly or masses felt. Normal bowel sounds heard. Central nervous system: Alert and oriented. No focal neurological deficits. Extremities: No C/C/E, +  pedal pulses Skin: No rashes, lesions or ulcers Psychiatry: Judgement and insight appear normal. Mood & affect appropriate.       Data Reviewed: I have personally reviewed following labs and imaging studies  CBC: Recent Labs  Lab 04/08/20 1338 04/09/20 0417 04/10/20 0504  WBC 20.8* 11.2* 21.2*  NEUTROABS 16.7* 10.0*  --   HGB 16.7 15.5 13.7  HCT 55.0* 51.4 45.5  MCV 94.8  94.1 96.0  PLT 505* 436* 671   Basic Metabolic Panel: Recent Labs  Lab 04/08/20 1338 04/09/20 0417 04/10/20 0504  NA 133* 135 135  K 4.6 4.4 4.0  CL 93* 97* 98  CO2 29 30 30   GLUCOSE 120* 139* 147*  BUN 8 9 12   CREATININE 0.49* 0.36* 0.48*  CALCIUM 9.3 9.0 9.0   GFR: Estimated Creatinine Clearance: 81.6 mL/min (A) (by C-G formula based on SCr of 0.48 mg/dL (L)). Liver Function Tests: Recent Labs  Lab 04/08/20 1338  AST 20  ALT 15  ALKPHOS 94  BILITOT 0.5  PROT 8.2*  ALBUMIN 4.0   No results for input(s): LIPASE, AMYLASE in the last 168 hours. No results for input(s): AMMONIA in the last 168 hours. Coagulation Profile: No results for input(s): INR, PROTIME in the last 168 hours. Cardiac Enzymes: No results for input(s): CKTOTAL, CKMB, CKMBINDEX, TROPONINI in the last 168 hours. BNP (last 3 results) No results for input(s): PROBNP in the last 8760 hours. HbA1C: No results for input(s): HGBA1C in the last 72 hours. CBG: No results for input(s): GLUCAP in the last 168 hours. Lipid Profile: No results for input(s): CHOL, HDL, LDLCALC, TRIG, CHOLHDL, LDLDIRECT in the last 72 hours. Thyroid Function Tests: No results for input(s): TSH, T4TOTAL, FREET4, T3FREE, THYROIDAB in the last 72 hours. Anemia Panel: No results for input(s): VITAMINB12, FOLATE, FERRITIN, TIBC, IRON, RETICCTPCT in the last 72 hours. Sepsis Labs: No results for input(s): PROCALCITON, LATICACIDVEN in the last 168 hours.  Recent Results (from the past 240 hour(s))  Resp Panel by RT-PCR (Flu A&B, Covid) Nasopharyngeal Swab     Status: None   Collection Time: 04/08/20  1:45 PM   Specimen: Nasopharyngeal Swab; Nasopharyngeal(NP) swabs in vial transport medium  Result Value Ref Range Status   SARS Coronavirus 2 by RT PCR NEGATIVE NEGATIVE Final    Comment: (NOTE) SARS-CoV-2 target nucleic acids are NOT DETECTED.  The SARS-CoV-2 RNA is generally detectable in upper respiratory specimens during the  acute phase of infection. The lowest concentration of SARS-CoV-2 viral copies this assay can detect is 138 copies/mL. A negative result does not preclude SARS-Cov-2 infection and should not be used as the sole basis for treatment or other patient management decisions. A negative result may occur with  improper specimen collection/handling, submission of specimen other than nasopharyngeal swab, presence of viral mutation(s) within the areas targeted by this assay, and inadequate number of viral copies(<138 copies/mL). A negative result must be combined with clinical observations, patient history, and epidemiological information. The expected result is Negative.  Fact Sheet for Patients:  EntrepreneurPulse.com.au  Fact Sheet for Healthcare Providers:  IncredibleEmployment.be  This test is no t yet approved or cleared by the [place] FDA and  has been authorized for detection and/or diagnosis of SARS-CoV-2 by FDA under an Emergency Use Authorization (EUA). This EUA will remain  in effect (meaning this test can be used) for the duration of the COVID-19 declaration under Section 564(b)(1) of the Act, 21 U.S.C.section 360bbb-3(b)(1), unless the authorization is terminated  or revoked  sooner.       Influenza A by PCR NEGATIVE NEGATIVE Final   Influenza B by PCR NEGATIVE NEGATIVE Final    Comment: (NOTE) The Xpert Xpress SARS-CoV-2/FLU/RSV plus assay is intended as an aid in the diagnosis of influenza from Nasopharyngeal swab specimens and should not be used as a sole basis for treatment. Nasal washings and aspirates are unacceptable for Xpert Xpress SARS-CoV-2/FLU/RSV testing.  Fact Sheet for Patients: EntrepreneurPulse.com.au  Fact Sheet for Healthcare Providers: IncredibleEmployment.be  This test is not yet approved or cleared by the [place] FDA and has been authorized for detection and/or  diagnosis of SARS-CoV-2 by FDA under an Emergency Use Authorization (EUA). This EUA will remain in effect (meaning this test can be used) for the duration of the COVID-19 declaration under Section 564(b)(1) of the Act, 21 U.S.C. section 360bbb-3(b)(1), unless the authorization is terminated or revoked.  Performed at [facility], [address] [place], [place] [address]   Culture, sputum-assessment     Status: None (Preliminary result)   Collection Time: 04/09/20  8:00 AM   Specimen: Sputum  Result Value Ref Range Status   Specimen Description SPUTUM  Final   Special Requests Normal  Final   Sputum evaluation   Final    THIS SPECIMEN IS ACCEPTABLE FOR SPUTUM CULTURE Performed at [facility], [address] [place], [place] [address]    Report Status PENDING  Incomplete  Culture, Respiratory w Gram Stain     Status: None   Collection Time: 04/09/20  8:00 AM   Specimen: SPU  Result Value Ref Range Status   Specimen Description   Final    SPUTUM Performed at [facility], [address] [place], [place] [address]    Special Requests   Final    Normal Reflexed from [id number] Performed at [facility], [address] [place], [place] [address]    Gram Stain   Final    RARE WBC PRESENT,BOTH PMN AND MONONUCLEAR FEW SQUAMOUS EPITHELIAL CELLS PRESENT RARE BUDDING YEAST SEEN RARE GRAM POSITIVE COCCI IN PAIRS    Culture   Final    RARE Normal respiratory flora-no Staph aureus or Pseudomonas seen Performed at [facility], [place] [address], [place], [place] [address]    Report Status 04/12/2020 FINAL  Final         Radiology Studies: No results found.      Scheduled Meds: . (feeding supplement) PROSource Plus  30 mL Oral BID BM  . acetaminophen  650 mg Oral QID  . arformoterol  15 mcg Nebulization BID  . budesonide (PULMICORT) nebulizer solution  0.5 mg Nebulization BID  . celecoxib  200 mg Oral BID  . cyclobenzaprine  10 mg Oral TID  . enoxaparin (LOVENOX)  injection  40 mg Subcutaneous Q24H  . feeding supplement  237 mL Oral TID BM  . gabapentin  300 mg Oral TID  . methylPREDNISolone (SOLU-MEDROL) injection  60 mg Intravenous Q12H  . nicotine  14 mg Transdermal Daily  . revefenacin  175 mcg Nebulization Daily  . traZODone  50 mg Oral QHS   Continuous Infusions: . cefTRIAXone (ROCEPHIN)  IV 1 g (04/12/20 1318)  . dextrose 5 % and 0.45% NaCl 50 mL/hr at 04/10/20 1046     LOS: 2 days    Time spent: 32mins    Kathie Dike, MD [facility]   If 7PM-7AM, please contact night-coverage [email or web address]  04/12/2020, 8:17 PM

## 2020-04-13 ENCOUNTER — Telehealth: Payer: Self-pay | Admitting: *Deleted

## 2020-04-13 DIAGNOSIS — J9621 Acute and chronic respiratory failure with hypoxia: Secondary | ICD-10-CM | POA: Diagnosis not present

## 2020-04-13 DIAGNOSIS — Z515 Encounter for palliative care: Secondary | ICD-10-CM | POA: Diagnosis not present

## 2020-04-13 DIAGNOSIS — Z7189 Other specified counseling: Secondary | ICD-10-CM | POA: Diagnosis not present

## 2020-04-13 LAB — ALPHA-1 ANTITRYPSIN PHENOTYPE: A-1 Antitrypsin, Ser: 167 mg/dL (ref 101–187)

## 2020-04-13 MED ORDER — MORPHINE SULFATE (CONCENTRATE) 10 MG/0.5ML PO SOLN
5.0000 mg | ORAL | Status: DC | PRN
  Administered 2020-04-13 – 2020-04-14 (×3): 5 mg via ORAL
  Filled 2020-04-13 (×3): qty 0.5

## 2020-04-13 MED ORDER — ALPRAZOLAM 0.5 MG PO TABS
0.5000 mg | ORAL_TABLET | Freq: Three times a day (TID) | ORAL | Status: DC | PRN
  Administered 2020-04-13 – 2020-04-14 (×3): 0.5 mg via ORAL
  Filled 2020-04-13 (×3): qty 1

## 2020-04-13 NOTE — Progress Notes (Signed)
Patient requires frequent re-positioning of the body in ways that cannot be achieved with an ordinary bed or wedge pillow, to eliminate pain, reduce pressure, and the head of the bed to be elevated more than 30 degrees most of the time due to COPD and acute on chronic respiratory failure with hypoxia.

## 2020-04-13 NOTE — Progress Notes (Signed)
PROGRESS NOTE    Cameron Villarreal  HEN:[id number] DOB: October 09, 1965 DOA: 04/08/2020 PCP: Lindell Spar, MD    Brief Narrative:  55 year old male with a history of severe COPD, bronchiectasis, chronic respiratory failure, admitted to the hospital from pulmonology clinic with acute on chronic respiratory failure due to COPD exacerbation of bronchiectasis.  Started on steroids, bronchodilators and antibiotics.   Assessment & Plan:   Active Problems:   Tobacco abuse   Respiratory failure, acute (HCC)   Anxiety state   Malnutrition of moderate degree (HCC)   COPD exacerbation (HCC)   Acute on chronic respiratory failure (HCC)   GERD (gastroesophageal reflux disease)   Chronic pain syndrome   Positive ANA (antinuclear antibody)   Protein-calorie malnutrition, severe   Acute on chronic respiratory failure with hypoxia -Secondary to COPD exacerbation of bronchiectasis -He reports that he chronically wears 2 L of oxygen at home -He normally ambulates independently, but has severely diminished exercise tolerance and tires very quickly -Currently requires 6 L of oxygen -Pulmonology following  COPD exacerbation -Steroids, bronchodilators, antibiotics -Still sounds tight with wheezing and increased work of breathing -Continue Solu-Medrol -Unfortunately continues to smoke  Cachexia from COPD -Nutrition consult  Chronic pain -Appears to be responding to morphine  Positive DS DNA -will need outpatient referral to rheumatology regarding further work up  Goals of care -He is being followed by palliative care -After several discussions involving palliative care, patient and his mother, patient has agreed to DNR status and wishes to discharge home with hospice services.   -His goals focus on quality of life and comfort   DVT prophylaxis: enoxaparin (LOVENOX) injection 40 mg Start: 04/08/20 1545  Code Status: DNR Family Communication: Discussed with patient's mother Disposition Plan:  Status is: Inpatient  The patient will require care spanning > 2 midnights and should be moved to inpatient because: Inpatient level of care appropriate due to severity of illness  Dispo: The patient is from: Home              Anticipated d/c is to: Home with hospice              Patient currently is not medically stable to d/c.   Difficult to place patient No    Consultants:   Pulmonology  Palliative care  Procedures:     Antimicrobials:   Azithromycin 3/3>  Ceftriaxone 3/3>    Subjective: Continues to feel short of breath.  Feels that pain and breathing not well controlled on current dose of morphine.  Met with palliative care this morning with plans to increase morphine dosing.  Feels that anxiety is also an issue.  We have a conversation around [place] and goals of care.  He is agreeable to DNR and wishes to go home with hospice and focus on quality of life.  Objective: Vitals:   04/13/20 0730 04/13/20 1437 04/13/20 1952 04/13/20 2005  BP:  126/88 (!) 139/92   Pulse:  (!) 105 91   Resp:  20 20   Temp:  99.3 F (37.4 C) 98 F (36.7 C)   TempSrc:  Oral    SpO2: 96% 100% (!) 86% 90%  Weight:      Height:        Intake/Output Summary (Last 24 hours) at 04/13/2020 2007 Last data filed at 04/13/2020 1900 Gross per 24 hour  Intake 2390 ml  Output 4400 ml  Net -2010 ml   Filed Weights   04/08/20 1324  Weight: 55.3 kg  Examination:  General exam: Alert, awake, oriented x 3 Respiratory system: Diminished breath sounds.  Increased work of breathing Cardiovascular system:RRR. No murmurs, rubs, gallops. Gastrointestinal system: Abdomen is nondistended, soft and nontender. No organomegaly or masses felt. Normal bowel sounds heard. Central nervous system: Alert and oriented. No focal neurological deficits. Extremities: No C/C/E, +pedal pulses Skin: No rashes, lesions or ulcers Psychiatry: Judgement and insight appear normal. Mood & affect appropriate.     Data Reviewed: I have personally reviewed following labs and imaging studies  CBC: Recent Labs  Lab 04/08/20 1338 04/09/20 0417 04/10/20 0504  WBC 20.8* 11.2* 21.2*  NEUTROABS 16.7* 10.0*  --   HGB 16.7 15.5 13.7  HCT 55.0* 51.4 45.5  MCV 94.8 94.1 96.0  PLT 505* 436* 929   Basic Metabolic Panel: Recent Labs  Lab 04/08/20 1338 04/09/20 0417 04/10/20 0504  NA 133* 135 135  K 4.6 4.4 4.0  CL 93* 97* 98  CO2 _0 GLUCOSE 120* 139* 147*  BUN _1 CREATININE 0.49* 0.36* 0.48*  CALCIUM 9.3 9.0 9.0   GFR: Estimated Creatinine Clearance: 81.6 mL/min (A) (by C-G formula based on SCr of 0.48 mg/dL (L)). Liver Function Tests: Recent Labs  Lab 04/08/20 1338  AST 20  ALT 15  ALKPHOS 94  BILITOT 0.5  PROT 8.2*  ALBUMIN 4.0   No results for input(s): LIPASE, AMYLASE in the last 168 hours. No results for input(s): AMMONIA in the last 168 hours. Coagulation Profile: No results for input(s): INR, PROTIME in the last 168 hours. Cardiac Enzymes: No results for input(s): CKTOTAL, CKMB, CKMBINDEX, TROPONINI in the last 168 hours. BNP (last 3 results) No results for input(s): PROBNP in the last 8760 hours. HbA1C: No results for input(s): HGBA1C in the last 72 hours. CBG: No results for input(s): GLUCAP in the last 168 hours. Lipid Profile: No results for input(s): CHOL, HDL, LDLCALC, TRIG, CHOLHDL, LDLDIRECT in the last 72 hours. Thyroid Function Tests: No results for input(s): TSH, T4TOTAL, FREET4, T3FREE, THYROIDAB in the last 72 hours. Anemia Panel: No results for input(s): VITAMINB12, FOLATE, FERRITIN, TIBC, IRON, RETICCTPCT in the last 72 hours. Sepsis Labs: No results for input(s): PROCALCITON, LATICACIDVEN in the last 168 hours.  Recent Results (from the past 240 hour(s))  Resp Panel by RT-PCR (Flu A&B, Covid) Nasopharyngeal Swab     Status: None   Collection Time: 04/08/20  1:45 PM   Specimen: Nasopharyngeal Swab; Nasopharyngeal(NP) swabs in vial  transport medium  Result Value Ref Range Status   SARS Coronavirus 2 by RT PCR NEGATIVE NEGATIVE Final    Comment: (NOTE) SARS-CoV-2 target nucleic acids are NOT DETECTED.  The SARS-CoV-2 RNA is generally detectable in upper respiratory specimens during the acute phase of infection. The lowest concentration of SARS-CoV-2 viral copies this assay can detect is 138 copies/mL. A negative result does not preclude SARS-Cov-2 infection and should not be used as the sole basis for treatment or other patient management decisions. A negative result may occur with  improper specimen collection/handling, submission of specimen other than nasopharyngeal swab, presence of viral mutation(s) within the areas targeted by this assay, and inadequate number of viral copies(<138 copies/mL). A negative result must be combined with clinical observations, patient history, and epidemiological information. The expected result is Negative.  Fact Sheet for Patients:  EntrepreneurPulse.com.au  Fact Sheet for Healthcare Providers:  IncredibleEmployment.be  This test is no t yet approved or cleared by the [place] FDA and  has been  authorized for detection and/or diagnosis of SARS-CoV-2 by FDA under an Emergency Use Authorization (EUA). This EUA will remain  in effect (meaning this test can be used) for the duration of the COVID-19 declaration under Section 564(b)(1) of the Act, 21 U.S.C.section 360bbb-3(b)(1), unless the authorization is terminated  or revoked sooner.       Influenza A by PCR NEGATIVE NEGATIVE Final   Influenza B by PCR NEGATIVE NEGATIVE Final    Comment: (NOTE) The Xpert Xpress SARS-CoV-2/FLU/RSV plus assay is intended as an aid in the diagnosis of influenza from Nasopharyngeal swab specimens and should not be used as a sole basis for treatment. Nasal washings and aspirates are unacceptable for Xpert Xpress SARS-CoV-2/FLU/RSV testing.  Fact  Sheet for Patients: EntrepreneurPulse.com.au  Fact Sheet for Healthcare Providers: IncredibleEmployment.be  This test is not yet approved or cleared by the [place] FDA and has been authorized for detection and/or diagnosis of SARS-CoV-2 by FDA under an Emergency Use Authorization (EUA). This EUA will remain in effect (meaning this test can be used) for the duration of the COVID-19 declaration under Section 564(b)(1) of the Act, 21 U.S.C. section 360bbb-3(b)(1), unless the authorization is terminated or revoked.  Performed at [facility], [address], [place], [place] [address]   Culture, sputum-assessment     Status: None (Preliminary result)   Collection Time: 04/09/20  8:00 AM   Specimen: Sputum  Result Value Ref Range Status   Specimen Description SPUTUM  Final   Special Requests Normal  Final   Sputum evaluation   Final    THIS SPECIMEN IS ACCEPTABLE FOR SPUTUM CULTURE Performed at [facility], [address], [place], [place] [address]    Report Status PENDING  Incomplete  Culture, Respiratory w Gram Stain     Status: None   Collection Time: 04/09/20  8:00 AM   Specimen: SPU  Result Value Ref Range Status   Specimen Description   Final    SPUTUM Performed at [facility], [address], [place], [place] [address]    Special Requests   Final    Normal Reflexed from [id number] Performed at [facility], [address], [place], [place] [address]    Gram Stain   Final    RARE WBC PRESENT,BOTH PMN AND MONONUCLEAR FEW SQUAMOUS EPITHELIAL CELLS PRESENT RARE BUDDING YEAST SEEN RARE GRAM POSITIVE COCCI IN PAIRS    Culture   Final    RARE Normal respiratory flora-no Staph aureus or Pseudomonas seen Performed at [facility], [place] [address] [place], [place] [address]    Report Status 04/12/2020 FINAL  Final         Radiology Studies: No results found.      Scheduled Meds: . (feeding supplement) PROSource Plus  30  mL Oral BID BM  . acetaminophen  650 mg Oral QID  . arformoterol  15 mcg Nebulization BID  . budesonide (PULMICORT) nebulizer solution  0.5 mg Nebulization BID  . celecoxib  200 mg Oral BID  . cyclobenzaprine  10 mg Oral TID  . enoxaparin (LOVENOX) injection  40 mg Subcutaneous Q24H  . feeding supplement  237 mL Oral TID BM  . gabapentin  300 mg Oral TID  . methylPREDNISolone (SOLU-MEDROL) injection  60 mg Intravenous Q12H  . nicotine  14 mg Transdermal Daily  . revefenacin  175 mcg Nebulization Daily  . traZODone  50 mg Oral QHS   Continuous Infusions: . cefTRIAXone (ROCEPHIN)  IV 1 g (04/13/20 1203)  LOS: 3 days    Time spent: 73mns    JKathie Dike MD [facility]   If 7PM-7AM, please contact night-coverage [email or web address]  04/13/2020, 8:07 PM

## 2020-04-13 NOTE — Progress Notes (Signed)
Palliative:  [name], Cameron Villarreal, is lying quietly in bed.  He has noted work of breathing.  He greets me briefly making, but not keeping eye contact.  He is alert and oriented, able to make his basic needs known.  His mother/HC POA, Cameron Villarreal, is at bedside.  We talked about Cameron Villarreal's symptom management.  He tells me that the by mouth morphine is helping his breathing.  He has used on average, 4 doses per day.  He would benefit from MS Contin 12-hour tab, 50 mg by mouth twice daily.  Cameron Villarreal states that he was provided Percocet 10 mg 3 times daily by former pulmonologist, Dr. Luan Pulling.  He states that he has not had this medicine in almost a year, and feels that by mouth narcotics would greatly improve his breathlessness, as evidenced by the benefit he has received from by mouth morphine while being hospitalized.  We talked about the difficulty in prescribing narcotics, in particular for people with end-stage COPD.   We talked about CODE STATUS.  We talked about "treat the treatable, but allowing natural death".  Cameron Villarreal states that he is unsure whether he would accept CPR or intubation.  He clearly states that he would not accept tracheostomy.  His mother states that she can abide by his wishes.  We talked about the realities of CPR for Cameron Villarreal who likely has osteoporosis.  We also talked about the difficulties of extubation for people with end-stage COPD.  Cameron Villarreal states that quality is better than quantity.  I encouraged family to continue CODE STATUS discussions.  We talked in detail about outpatient palliative and hospice services.  We talked about the benefits of hospice services for "treat the treatable" care as they would provide more hands-on service, and could assist with symptom management.  Several times Cameron Villarreal states that he would prefer that he had his Percocet.  It has been almost 1 year since he had has had this prescription and I encouraged him to work with hospice on what they can provide for symptom management for  his breathlessness.  He agrees, although he does somewhat seem to be fixated on Percocet as the answer to his breathlessness.  Choice offered, family chooses hospice of [place].  Cameron Villarreal talks about how difficult it is for Cameron Villarreal to get out to doctor's appointments.  She states that it can take him 2 hours to get ready, and takes everything out of him.   Conference with attending, bedside nursing staff, transition of care team related to patient condition, needs, hospice referral.  Plan: Continue to treat the treatable.  Considering CODE STATUS options.  Continue to follow-up with trusted outpatient pulmonology. Patient and mother are agreeable to "treat the treatable" at home hospice care with [facility].  63 minutes  Cameron Axe, NP Palliative medicine team Team phone [phone] Greater than 50% of this time was spent counseling and coordinating care related to the above assessment and plan.

## 2020-04-13 NOTE — TOC Progression Note (Addendum)
Transition of Care [facility]) - Progression Note    Patient Details  Name: Cameron Villarreal MRN: [id number] Date of Birth: [birth year]  Transition of Care [facility]) CM/SW Contact  Salome Arnt, [place] Phone Number: 04/13/2020, 11:12 AM  Clinical Narrative:  Pt requesting to have hospital bed delivered tomorrow instead of today as family still needs to move bed out to make room. LCSW notified Barbaraann Rondo with [employer]. Will follow.    Update: Hospital bed on backorder with [employer]. Pt notified and would like one at d/c. Referred to [facility] who will notify TOC if they are able to provide hospital bed.  Expected Discharge Plan: Home/Self Care Barriers to Discharge: Continued Medical Work up  Expected Discharge Plan and Services Expected Discharge Plan: Home/Self Care In-house Referral: Clinical Social Work   Post Acute Care Choice: NA Living arrangements for the past 2 months: Single Family Home                                       Social Determinants of Health (SDOH) Interventions    Readmission Risk Interventions No flowsheet data found.

## 2020-04-13 NOTE — Telephone Encounter (Signed)
Okay. Please have them send the order sheet. Thank you.

## 2020-04-13 NOTE — TOC Progression Note (Signed)
Transition of Care [facility]) - Progression Note    Patient Details  Name: MICAHEL OMLOR MRN: [id number] Date of Birth: 1965-11-03  Transition of Care [facility]) CM/SW Contact  Salome Arnt, [place] Phone Number: 04/13/2020, 12:31 PM  Clinical Narrative:  Per palliative, pt and mother agreeable to hospice referral. They request [facility]. LCSW confirmed with pt and mother. They will need hospital bed, O2, overbed hospital table, and wheelchair. Cassandra at [facility] notified. LCSW canceled referral to [facility] for hospital bed. TOC will follow.       Expected Discharge Plan: Home/Self Care Barriers to Discharge: Continued Medical Work up  Expected Discharge Plan and Services Expected Discharge Plan: Home/Self Care In-house Referral: Clinical Social Work   Post Acute Care Choice: NA Living arrangements for the past 2 months: Single Family Home                                       Social Determinants of Health (SDOH) Interventions    Readmission Risk Interventions No flowsheet data found.

## 2020-04-13 NOTE — Progress Notes (Signed)
Physical Therapy Treatment Patient Details Name: Cameron Villarreal MRN: [id number] DOB: 07-Nov-1965 Today's Date: 04/13/2020    History of Present Illness Cameron Villarreal is a 55 y.o. male with medical history significant of advanced COPD - emphysema, chronic bronchitis, bronchiectasis on home oxygen; GERD, Anxiety, chronic pain with skeletal abnormality and protein-calorie malnutrition. He presented to pulmonary clinic, Dr. Halford Chessman, today having more cough and sputum,  Bringing up brown phlegm.  Doesn't feel like he can get enough air into his lungs.  Feels tight in his chest.  Needing increased amounts of oxygen at home.  Ran out of prednisone - wasn't sure he was supposed to stay on prednisone.  Not awrare of fever.  Denies hemoptysis.  Not much appetite.  Feeling weaker. He was instructed to present to [facility] for evaluation and admission.    PT Comments    Patient's mother present throughout session. Patient agreeable to getting out of bed to chair after encouragement. Patient not agreeable to ambulation to door of room. Patient completed a few repetitions but refused to complete after 4-5 repetitions of each exercise. Shortness of breath and dyspnea on exertion throughout session. Patient sitting in recliner at end of session with mother present.    Follow Up Recommendations  No PT follow up     Equipment Recommendations  None recommended by PT    Recommendations for Other Services       Precautions / Restrictions Precautions Precautions: None Restrictions Weight Bearing Restrictions: No    Mobility  Bed Mobility Overal bed mobility: Modified Independent    Transfers   Equipment used: None Transfers: Sit to/from Stand;Stand Pivot Transfers Sit to Stand: Supervision Stand pivot transfers: Supervision      Ambulation/Gait Ambulation/Gait assistance: Supervision Gait Distance (Feet): 4 Feet Assistive device: None Gait Pattern/deviations: Step-through pattern;Decreased step length  - right;Decreased step length - left;Decreased stride length;Wide base of support Gait velocity: decreased   General Gait Details: somewhat impulsive, rushed movements during short distance ambulation from bed to chair; on 5 L O2; shortness of breath; dyspnea on exertion; limited by fatigue/SOB   Stairs       Wheelchair Mobility    Modified Rankin (Stroke Patients Only)       Balance Overall balance assessment: Needs assistance         Standing balance support: No upper extremity supported Standing balance-Leahy Scale: Good Standing balance comment: minimal LOB when first standing.         Cognition Arousal/Alertness: Awake/alert Behavior During Therapy: WFL for tasks assessed/performed Overall Cognitive Status: Within Functional Limits for tasks assessed      Exercises General Exercises - Upper Extremity Shoulder Flexion: AROM;Strengthening;Both;5 reps;Seated General Exercises - Lower Extremity Hip Flexion/Marching: AROM;Strengthening;Both;5 reps;Seated    General Comments        Pertinent Vitals/Pain Pain Assessment: No/denies pain    Home Living      Prior Function        PT Goals (current goals can now be found in the care plan section) Acute Rehab PT Goals Patient Stated Goal: Return home. PT Goal Formulation: With patient Time For Goal Achievement: 04/16/20 Potential to Achieve Goals: Good Progress towards PT goals: Progressing toward goals    Frequency    Min 2X/week      PT Plan Current plan remains appropriate       AM-PAC PT "6 Clicks" Mobility   Outcome Measure  Help needed turning from your back to your side while in a flat bed without using  bedrails?: None Help needed moving from lying on your back to sitting on the side of a flat bed without using bedrails?: None Help needed moving to and from a bed to a chair (including a wheelchair)?: None Help needed standing up from a chair using your arms (e.g., wheelchair or bedside  chair)?: None Help needed to walk in hospital room?: A Little Help needed climbing 3-5 steps with a railing? : A Lot 6 Click Score: 21    End of Session Equipment Utilized During Treatment: Oxygen Activity Tolerance: Patient limited by fatigue;Other (comment) (Patient limited by SOB) Patient left: in chair;with family/visitor present;with call bell/phone within reach Nurse Communication: Mobility status PT Visit Diagnosis: Unsteadiness on feet (R26.81);Other abnormalities of gait and mobility (R26.89);Muscle weakness (generalized) (M62.81)     Time: 5520-8022 PT Time Calculation (min) (ACUTE ONLY): 15 min  Charges:  $Therapeutic Activity: 8-22 mins                      Floria Raveling. Hartnett-Rands, MS, PT Per [place] [phone]  Pamala Hurry  Hartnett-Rands 04/13/2020, 12:14 PM

## 2020-04-13 NOTE — Telephone Encounter (Signed)
Cassandra with [facility] called wanting to know if you would be the attending physician for pt please advise

## 2020-04-14 DIAGNOSIS — G894 Chronic pain syndrome: Secondary | ICD-10-CM | POA: Diagnosis not present

## 2020-04-14 DIAGNOSIS — Z515 Encounter for palliative care: Secondary | ICD-10-CM | POA: Diagnosis not present

## 2020-04-14 DIAGNOSIS — J441 Chronic obstructive pulmonary disease with (acute) exacerbation: Secondary | ICD-10-CM | POA: Diagnosis not present

## 2020-04-14 DIAGNOSIS — K219 Gastro-esophageal reflux disease without esophagitis: Secondary | ICD-10-CM | POA: Diagnosis not present

## 2020-04-14 DIAGNOSIS — Z7189 Other specified counseling: Secondary | ICD-10-CM | POA: Diagnosis not present

## 2020-04-14 DIAGNOSIS — J9621 Acute and chronic respiratory failure with hypoxia: Secondary | ICD-10-CM | POA: Diagnosis not present

## 2020-04-14 MED ORDER — PREDNISONE 10 MG PO TABS: ORAL_TABLET | ORAL | 1 refills | Status: DC

## 2020-04-14 MED ORDER — ALBUTEROL SULFATE (2.5 MG/3ML) 0.083% IN NEBU: 2.5000 mg | INHALATION_SOLUTION | RESPIRATORY_TRACT | 5 refills | Status: AC | PRN

## 2020-04-14 MED ORDER — MORPHINE SULFATE (CONCENTRATE) 10 MG/0.5ML PO SOLN: 5.0000 mg | ORAL | 0 refills | Status: AC | PRN

## 2020-04-14 MED ORDER — MORPHINE SULFATE ER 15 MG PO TBCR: 15.0000 mg | EXTENDED_RELEASE_TABLET | Freq: Two times a day (BID) | ORAL | 0 refills | Status: DC

## 2020-04-14 MED ORDER — ALPRAZOLAM 0.5 MG PO TABS: 0.5000 mg | ORAL_TABLET | Freq: Three times a day (TID) | ORAL | 0 refills | Status: AC | PRN

## 2020-04-14 NOTE — Progress Notes (Signed)
Physical Therapy Treatment Patient Details Name: Cameron Villarreal MRN: [id number] DOB: 12-20-1965 Today's Date: 04/14/2020    History of Present Illness Cameron Villarreal is a 55 y.o. male with medical history significant of advanced COPD - emphysema, chronic bronchitis, bronchiectasis on home oxygen; GERD, Anxiety, chronic pain with skeletal abnormality and protein-calorie malnutrition. He presented to pulmonary clinic, Dr. Halford Chessman, today having more cough and sputum,  Bringing up brown phlegm.  Doesn't feel like he can get enough air into his lungs.  Feels tight in his chest.  Needing increased amounts of oxygen at home.  Ran out of prednisone - wasn't sure he was supposed to stay on prednisone.  Not awrare of fever.  Denies hemoptysis.  Not much appetite.  Feeling weaker. He was instructed to present to [facility] for evaluation and admission.    PT Comments    Pt agreeable to ambulation with motivation, but declines exercises. Pt able to ambulate to door from bed and back, then requires seated rest due to fatigue, moderate dyspnea, HR max 128 that reduces with seated rest break. Pt able to complete additional ambulation bout, then returns to supine in bed without assistance. Pt on 6L with SpO2 95% during ambulation. Pt left supine in bed with friend in room, RN notified of session.  Follow Up Recommendations  No PT follow up     Equipment Recommendations  None recommended by PT    Recommendations for Other Services       Precautions / Restrictions Precautions Precautions: Other (comment) Precaution Comments: monitor O2 Restrictions Weight Bearing Restrictions: No    Mobility  Bed Mobility Overal bed mobility: Modified Independent   Transfers Overall transfer level: Modified independent Equipment used: None Transfers: Sit to/from Stand    General transfer comment: light hand assisting to power up to standing, wide BOS  Ambulation/Gait Ambulation/Gait assistance: Supervision Gait Distance  (Feet): 40 Feet (20 ft x2) Assistive device: None Gait Pattern/deviations: Step-through pattern;Decreased stride length;Wide base of support Gait velocity: decreased   General Gait Details: pt ambulates 20 ft x2 reps with seated rest break between, moderate dyspnea with ambulation, maintains wide BOS and labored breathing noted, initial O2 tubing safety cues with good carryover during ambulation able to complete turns with safe O2 tube management   Stairs             Wheelchair Mobility    Modified Rankin (Stroke Patients Only)       Balance Overall balance assessment: No apparent balance deficits (not formally assessed)     Cognition Arousal/Alertness: Awake/alert Behavior During Therapy: WFL for tasks assessed/performed Overall Cognitive Status: Within Functional Limits for tasks assessed     Exercises      General Comments General comments (skin integrity, edema, etc.): Pt on 6L O2 with SpO2 95% with ambulation, HR max 128 that improves to 110 with sitting rest      Pertinent Vitals/Pain Pain Assessment: No/denies pain    Home Living                      Prior Function            PT Goals (current goals can now be found in the care plan section) Acute Rehab PT Goals Patient Stated Goal: Return home. PT Goal Formulation: With patient Time For Goal Achievement: 04/16/20 Potential to Achieve Goals: Good Progress towards PT goals: Progressing toward goals    Frequency    Min 2X/week      PT  Plan Current plan remains appropriate    Co-evaluation              AM-PAC PT "6 Clicks" Mobility   Outcome Measure  Help needed turning from your back to your side while in a flat bed without using bedrails?: None Help needed moving from lying on your back to sitting on the side of a flat bed without using bedrails?: None Help needed moving to and from a bed to a chair (including a wheelchair)?: None Help needed standing up from a chair using  your arms (e.g., wheelchair or bedside chair)?: None Help needed to walk in hospital room?: None Help needed climbing 3-5 steps with a railing? : A Little 6 Click Score: 23    End of Session Equipment Utilized During Treatment: Oxygen Activity Tolerance: Patient limited by fatigue Patient left: in bed;with call bell/phone within reach;with family/visitor present Nurse Communication: Mobility status PT Visit Diagnosis: Unsteadiness on feet (R26.81);Other abnormalities of gait and mobility (R26.89);Muscle weakness (generalized) (M62.81)     Time: 4784-1282 PT Time Calculation (min) (ACUTE ONLY): 16 min  Charges:  $Gait Training: 8-22 mins                      Tori Broussard PT, DPT 04/14/20, 12:18 PM

## 2020-04-14 NOTE — Telephone Encounter (Signed)
LVM for cassandra to let her know Dr Posey Pronto would be attending

## 2020-04-14 NOTE — Discharge Summary (Signed)
Physician Discharge Summary  Cameron Villarreal YNW:[id number] DOB: 1965-07-30 DOA: 04/08/2020  PCP: Lindell Spar, MD  Admit date: 04/08/2020 Discharge date: 04/14/2020  Admitted From: Home Disposition: Home with hospice  Recommendations for Outpatient Follow-up:  1. Patient is being discharged home with hospice services to focus on quality of life  Home Health: Equipment/Devices:  Discharge Condition: Stable CODE STATUS: DNR Diet recommendation: Heart healthy  Brief/Interim Summary: 55 year old male with a history of severe COPD, bronchiectasis, chronic respiratory failure, admitted to the hospital from pulmonology clinic with acute on chronic respiratory failure due to COPD exacerbation of bronchiectasis.  Started on steroids, bronchodilators and antibiotics  Discharge Diagnoses:  Active Problems:   Tobacco abuse   Respiratory failure, acute (HCC)   Anxiety state   Malnutrition of moderate degree (HCC)   COPD exacerbation (HCC)   Acute on chronic respiratory failure (HCC)   GERD (gastroesophageal reflux disease)   Chronic pain syndrome   Positive ANA (antinuclear antibody)   Protein-calorie malnutrition, severe   Hospice care patient  Acute on chronic respiratory failure with hypoxia -Secondary to COPD exacerbation of bronchiectasis -He reports that he chronically wears 2 L of oxygen at home -He normally ambulates independently, but has severely diminished exercise tolerance and tires very quickly -Currently requires 5 L of oxygen -Pulmonology following  COPD exacerbation -Steroids, bronchodilators, antibiotics -He is completed a course of Rocephin -He has been transitioned to prednisone taper, and will likely stay on chronic prednisone -Unfortunately continues to smoke  Cachexia from COPD -Nutrition consult  Chronic pain -Appears to be responding to morphine -Started on MS Contin as well as MSIR for breakthrough pain/shortness of breath  Anxiety -Started on as  needed Xanax  Positive DS DNA -will need outpatient referral to rheumatology regarding further work up if patient desires  Goals of care -After several discussions involving palliative care, patient and his mother, patient has agreed to DNR status and wishes to discharge home with hospice services.   -His goals focus on quality of life and comfort  Discharge Instructions  Discharge Instructions    Diet - low sodium heart healthy   Complete by: As directed    Increase activity slowly   Complete by: As directed      Allergies as of 04/14/2020      Reactions   Codeine Itching, Nausea Only   Hydrocodone Itching, Swelling, Other (See Comments)   "Makes it difficult to swallow," but no breathing impairment noted Patient state he can take Oxycodone took it for his arm fracture recently.      Medication List    STOP taking these medications   nicotine 14 mg/24hr patch Commonly known as: NICODERM CQ - dosed in mg/24 hours   sodium chloride HYPERTONIC 3 % nebulizer solution     TAKE these medications   acetaminophen 500 MG tablet Commonly known as: TYLENOL Take 500-1,000 mg by mouth every 6 (six) hours as needed (for pain or headaches).   albuterol 108 (90 Base) MCG/ACT inhaler Commonly known as: VENTOLIN HFA Inhale 2 puffs into the lungs 4 (four) times daily as needed. What changed: reasons to take this   albuterol (2.5 MG/3ML) 0.083% nebulizer solution Commonly known as: PROVENTIL Take 3 mLs (2.5 mg total) by nebulization every 4 (four) hours as needed for wheezing or shortness of breath. What changed: Another medication with the same name was changed. Make sure you understand how and when to take each.   ALPRAZolam 0.5 MG tablet Commonly known as: XANAX Take  1 tablet (0.5 mg total) by mouth 3 (three) times daily as needed for anxiety.   Breztri Aerosphere 160-9-4.8 MCG/ACT Aero Generic drug: Budeson-Glycopyrrol-Formoterol Inhale 2 puffs into the lungs in the morning  and at bedtime.   cyclobenzaprine 10 MG tablet Commonly known as: FLEXERIL Take 1 tablet (10 mg total) by mouth 2 (two) times daily.   gabapentin 300 MG capsule Commonly known as: NEURONTIN Take 1 capsule (300 mg total) by mouth 3 (three) times daily.   morphine CONCENTRATE 10 MG/0.5ML Soln concentrated solution Take 0.25 mLs (5 mg total) by mouth every 2 (two) hours as needed for moderate pain, anxiety or shortness of breath (breathlessness).   morphine 15 MG 12 hr tablet Commonly known as: MS Contin Take 1 tablet (15 mg total) by mouth every 12 (twelve) hours.   predniSONE 10 MG tablet Commonly known as: DELTASONE Take 40mg  po daily for 3 days then 30mg  daily for 3 days then 20mg  daily for 3 days then 10mg  daily What changed:   medication strength  how much to take  how to take this  when to take this  additional instructions   traZODone 50 MG tablet Commonly known as: DESYREL Take 1 tablet (50 mg total) by mouth at bedtime.       Follow-up Information    [facility] Follow up.   Why: Wheelchair, Hospital bed. Contact information: [address] [place] [address] [place] [facility] up.   Specialty: Hospice Why: Home Hospice Contact information: [address] [place] [address] [phone]             Allergies  Allergen Reactions  . Codeine Itching and Nausea Only  . Hydrocodone Itching, Swelling and Other (See Comments)    "Makes it difficult to swallow," but no breathing impairment noted Patient state he can take Oxycodone took it for his arm fracture recently.    Consultations:  Pulmonology  Palliative care   Procedures/Studies: [facility] Chest Port 1 View  Result Date: 04/08/2020 CLINICAL DATA:  Shortness of breath, increasing shortness of breath. EXAM: PORTABLE CHEST 1 VIEW COMPARISON:  Chest x-rays dated 09/18/2019 in 11/24/2017. FINDINGS: Heart  size and mediastinal contours appear stable. Lungs are hyperexpanded. Coarse lung markings are again seen bilaterally, not appreciably changed. Chronic bronchitic changes noted centrally. Presumed emphysematous blebs at the lung apices, RIGHT greater than LEFT. No pleural effusion or pneumothorax is seen. No acute appearing osseous abnormality. IMPRESSION: 1. No active disease. No evidence of pneumonia or pulmonary edema. 2. Hyperexpanded lungs indicating COPD. Associated chronic bronchitic changes and probable chronic interstitial lung disease. Electronically Signed   By: Franki Cabot M.D.   On: 04/08/2020 14:18       Subjective: Feels that shortness of breath is better.  Anxious to discharge home today.  Discharge Exam: Vitals:   04/14/20 0808 04/14/20 0819 04/14/20 1247 04/14/20 1445  BP:   (!) 146/98   Pulse:   (!) 118   Resp:   19   Temp:   98.4 F (36.9 C)   TempSrc:   Oral   SpO2: 98% 100% 92% 91%  Weight:      Height:        General: Pt is alert, awake, not in acute distress Cardiovascular: RRR, S1/S2 +, no rubs, no gallops Respiratory: Diminished breath sounds bilaterally Abdominal: Soft, NT, ND, bowel sounds + Extremities: no edema, no cyanosis  The results of significant diagnostics from this hospitalization (including imaging, microbiology, ancillary and laboratory) are listed below for reference.     Microbiology: Recent Results (from the past 240 hour(s))  Resp Panel by RT-PCR (Flu A&B, Covid) Nasopharyngeal Swab     Status: None   Collection Time: 04/08/20  1:45 PM   Specimen: Nasopharyngeal Swab; Nasopharyngeal(NP) swabs in vial transport medium  Result Value Ref Range Status   SARS Coronavirus 2 by RT PCR NEGATIVE NEGATIVE Final    Comment: (NOTE) SARS-CoV-2 target nucleic acids are NOT DETECTED.  The SARS-CoV-2 RNA is generally detectable in upper respiratory specimens during the acute phase of infection. The lowest concentration of SARS-CoV-2 viral  copies this assay can detect is 138 copies/mL. A negative result does not preclude SARS-Cov-2 infection and should not be used as the sole basis for treatment or other patient management decisions. A negative result may occur with  improper specimen collection/handling, submission of specimen other than nasopharyngeal swab, presence of viral mutation(s) within the areas targeted by this assay, and inadequate number of viral copies(<138 copies/mL). A negative result must be combined with clinical observations, patient history, and epidemiological information. The expected result is Negative.  Fact Sheet for Patients:  EntrepreneurPulse.com.au  Fact Sheet for Healthcare Providers:  IncredibleEmployment.be  This test is no t yet approved or cleared by the [place] FDA and  has been authorized for detection and/or diagnosis of SARS-CoV-2 by FDA under an Emergency Use Authorization (EUA). This EUA will remain  in effect (meaning this test can be used) for the duration of the COVID-19 declaration under Section 564(b)(1) of the Act, 21 U.S.C.section 360bbb-3(b)(1), unless the authorization is terminated  or revoked sooner.       Influenza A by PCR NEGATIVE NEGATIVE Final   Influenza B by PCR NEGATIVE NEGATIVE Final    Comment: (NOTE) The Xpert Xpress SARS-CoV-2/FLU/RSV plus assay is intended as an aid in the diagnosis of influenza from Nasopharyngeal swab specimens and should not be used as a sole basis for treatment. Nasal washings and aspirates are unacceptable for Xpert Xpress SARS-CoV-2/FLU/RSV testing.  Fact Sheet for Patients: EntrepreneurPulse.com.au  Fact Sheet for Healthcare Providers: IncredibleEmployment.be  This test is not yet approved or cleared by the [place] FDA and has been authorized for detection and/or diagnosis of SARS-CoV-2 by FDA under an Emergency Use Authorization (EUA). This  EUA will remain in effect (meaning this test can be used) for the duration of the COVID-19 declaration under Section 564(b)(1) of the Act, 21 U.S.C. section 360bbb-3(b)(1), unless the authorization is terminated or revoked.  Performed at [facility], [address] [place], [place] [address]   Culture, sputum-assessment     Status: None (Preliminary result)   Collection Time: 04/09/20  8:00 AM   Specimen: Sputum  Result Value Ref Range Status   Specimen Description SPUTUM  Final   Special Requests Normal  Final   Sputum evaluation   Final    THIS SPECIMEN IS ACCEPTABLE FOR SPUTUM CULTURE Performed at [facility], [address] [place], [place] [address]    Report Status PENDING  Incomplete  Culture, Respiratory w Gram Stain     Status: None   Collection Time: 04/09/20  8:00 AM   Specimen: SPU  Result Value Ref Range Status   Specimen Description   Final    SPUTUM Performed at [facility], [address], [place], [place] [address]    Special Requests   Final    Normal Reflexed  from [id number] Performed at [facility], [address] [place], [place] [address]    Gram Stain   Final    RARE WBC PRESENT,BOTH PMN AND MONONUCLEAR FEW SQUAMOUS EPITHELIAL CELLS PRESENT RARE BUDDING YEAST SEEN RARE GRAM POSITIVE COCCI IN PAIRS    Culture   Final    RARE Normal respiratory flora-no Staph aureus or Pseudomonas seen Performed at [facility], [address] [place], [place] [address]    Report Status 04/12/2020 FINAL  Final     Labs: BNP (last 3 results) No results for input(s): BNP in the last 8760 hours. Basic Metabolic Panel: Recent Labs  Lab 04/08/20 1338 04/09/20 0417 04/10/20 0504  NA 133* 135 135  K 4.6 4.4 4.0  CL 93* 97* 98  CO2 29 30 30   GLUCOSE 120* 139* 147*  BUN 8 9 12   CREATININE 0.49* 0.36* 0.48*  CALCIUM 9.3 9.0 9.0   Liver Function Tests: Recent Labs  Lab 04/08/20 1338  AST 20  ALT 15  ALKPHOS 94  BILITOT 0.5  PROT 8.2*  ALBUMIN 4.0    No results for input(s): LIPASE, AMYLASE in the last 168 hours. No results for input(s): AMMONIA in the last 168 hours. CBC: Recent Labs  Lab 04/08/20 1338 04/09/20 0417 04/10/20 0504  WBC 20.8* 11.2* 21.2*  NEUTROABS 16.7* 10.0*  --   HGB 16.7 15.5 13.7  HCT 55.0* 51.4 45.5  MCV 94.8 94.1 96.0  PLT 505* 436* 398   Cardiac Enzymes: No results for input(s): CKTOTAL, CKMB, CKMBINDEX, TROPONINI in the last 168 hours. BNP: Invalid input(s): POCBNP CBG: No results for input(s): GLUCAP in the last 168 hours. D-Dimer No results for input(s): DDIMER in the last 72 hours. Hgb A1c No results for input(s): HGBA1C in the last 72 hours. Lipid Profile No results for input(s): CHOL, HDL, LDLCALC, TRIG, CHOLHDL, LDLDIRECT in the last 72 hours. Thyroid function studies No results for input(s): TSH, T4TOTAL, T3FREE, THYROIDAB in the last 72 hours.  Invalid input(s): FREET3 Anemia work up No results for input(s): VITAMINB12, FOLATE, FERRITIN, TIBC, IRON, RETICCTPCT in the last 72 hours. Urinalysis    Component Value Date/Time   COLORURINE YELLOW 01/28/2017 1551   APPEARANCEUR HAZY (A) 01/28/2017 1551   LABSPEC 1.023 01/28/2017 1551   PHURINE 6.0 01/28/2017 1551   GLUCOSEU NEGATIVE 01/28/2017 1551   HGBUR NEGATIVE 01/28/2017 1551   BILIRUBINUR NEGATIVE 01/28/2017 1551   KETONESUR NEGATIVE 01/28/2017 1551   PROTEINUR NEGATIVE 01/28/2017 1551   NITRITE NEGATIVE 01/28/2017 1551   LEUKOCYTESUR NEGATIVE 01/28/2017 1551   Sepsis Labs Invalid input(s): PROCALCITONIN,  WBC,  LACTICIDVEN Microbiology Recent Results (from the past 240 hour(s))  Resp Panel by RT-PCR (Flu A&B, Covid) Nasopharyngeal Swab     Status: None   Collection Time: 04/08/20  1:45 PM   Specimen: Nasopharyngeal Swab; Nasopharyngeal(NP) swabs in vial transport medium  Result Value Ref Range Status   SARS Coronavirus 2 by RT PCR NEGATIVE NEGATIVE Final    Comment: (NOTE) SARS-CoV-2 target nucleic acids are NOT  DETECTED.  The SARS-CoV-2 RNA is generally detectable in upper respiratory specimens during the acute phase of infection. The lowest concentration of SARS-CoV-2 viral copies this assay can detect is 138 copies/mL. A negative result does not preclude SARS-Cov-2 infection and should not be used as the sole basis for treatment or other patient management decisions. A negative result may occur with  improper specimen collection/handling, submission of specimen other than nasopharyngeal swab, presence of viral mutation(s) within the  areas targeted by this assay, and inadequate number of viral copies(<138 copies/mL). A negative result must be combined with clinical observations, patient history, and epidemiological information. The expected result is Negative.  Fact Sheet for Patients:  EntrepreneurPulse.com.au  Fact Sheet for Healthcare Providers:  IncredibleEmployment.be  This test is no t yet approved or cleared by the [place] FDA and  has been authorized for detection and/or diagnosis of SARS-CoV-2 by FDA under an Emergency Use Authorization (EUA). This EUA will remain  in effect (meaning this test can be used) for the duration of the COVID-19 declaration under Section 564(b)(1) of the Act, 21 U.S.C.section 360bbb-3(b)(1), unless the authorization is terminated  or revoked sooner.       Influenza A by PCR NEGATIVE NEGATIVE Final   Influenza B by PCR NEGATIVE NEGATIVE Final    Comment: (NOTE) The Xpert Xpress SARS-CoV-2/FLU/RSV plus assay is intended as an aid in the diagnosis of influenza from Nasopharyngeal swab specimens and should not be used as a sole basis for treatment. Nasal washings and aspirates are unacceptable for Xpert Xpress SARS-CoV-2/FLU/RSV testing.  Fact Sheet for Patients: EntrepreneurPulse.com.au  Fact Sheet for Healthcare Providers: IncredibleEmployment.be  This test is not yet  approved or cleared by the [place] FDA and has been authorized for detection and/or diagnosis of SARS-CoV-2 by FDA under an Emergency Use Authorization (EUA). This EUA will remain in effect (meaning this test can be used) for the duration of the COVID-19 declaration under Section 564(b)(1) of the Act, 21 U.S.C. section 360bbb-3(b)(1), unless the authorization is terminated or revoked.  Performed at [facility], [address] [place], [place] [address]   Culture, sputum-assessment     Status: None (Preliminary result)   Collection Time: 04/09/20  8:00 AM   Specimen: Sputum  Result Value Ref Range Status   Specimen Description SPUTUM  Final   Special Requests Normal  Final   Sputum evaluation   Final    THIS SPECIMEN IS ACCEPTABLE FOR SPUTUM CULTURE Performed at [facility], [address] [place], [place] [address]    Report Status PENDING  Incomplete  Culture, Respiratory w Gram Stain     Status: None   Collection Time: 04/09/20  8:00 AM   Specimen: SPU  Result Value Ref Range Status   Specimen Description   Final    SPUTUM Performed at [facility], [address] [place], [place] [address]    Special Requests   Final    Normal Reflexed from [id number] Performed at [facility], [address] [place], [place] [address]    Gram Stain   Final    RARE WBC PRESENT,BOTH PMN AND MONONUCLEAR FEW SQUAMOUS EPITHELIAL CELLS PRESENT RARE BUDDING YEAST SEEN RARE GRAM POSITIVE COCCI IN PAIRS    Culture   Final    RARE Normal respiratory flora-no Staph aureus or Pseudomonas seen Performed at [facility], [place] [address], [place], [place] [address]    Report Status 04/12/2020 FINAL  Final     Time coordinating discharge: 43mins  SIGNED:   Kathie Dike, MD  [facility] 04/14/2020, 9:25 PM   If 7PM-7AM, please contact night-coverage [email or web address]

## 2020-04-14 NOTE — Progress Notes (Signed)
   04/14/20 1247  Assess: MEWS Score  Temp 98.4 F (36.9 C)  BP (!) 146/98  Pulse Rate (!) 118  Resp 19  SpO2 92 %  O2 Device Nasal Cannula  O2 Flow Rate (L/min) 6 L/min  Assess: MEWS Score  MEWS Temp 0  MEWS Systolic 0  MEWS Pulse 2  MEWS RR 0  MEWS LOC 0  MEWS Score 2  MEWS Score Color Yellow  Assess: if the MEWS score is Yellow or Red  Were vital signs taken at a resting state? No  Focused Assessment No change from prior assessment  Early Detection of Sepsis Score *See Row Information* Low  MEWS guidelines implemented *See Row Information* No, other (Comment) (Patient HR runs up and down)  Treat  MEWS Interventions Administered scheduled meds/treatments  Notify: Charge Nurse/RN  Name of Charge Nurse/RN Notified Gean Maidens, RN  Date Charge Nurse/RN Notified 04/14/20  Time Charge Nurse/RN Notified 1250  Document  Patient Outcome Stabilized after interventions  Progress note created (see row info) Yes

## 2020-04-14 NOTE — Care Management Important Message (Signed)
Important Message  Patient Details  Name: Cameron Villarreal MRN: [id number] Date of Birth: 28-Jan-1966   Medicare Important Message Given:  Yes     Tommy Medal 04/14/2020, 1:24 PM

## 2020-04-14 NOTE — TOC Progression Note (Signed)
Transition of Care [facility]) - Progression Note    Patient Details  Name: Cameron Villarreal MRN: [id number] Date of Birth: 04/14/1965  Transition of Care [facility]) CM/SW Contact  Natasha Bence, LCSW Phone Number: 04/14/2020, 11:36 AM  Clinical Narrative:    CSW followed up with Cassandra of [facility] about equipment. Cassandra reported that order for equipment had been placed and that the delay was due to patient's mother's request to have hospital bed delivered [facility] 04/15/20. TOC to follow.   Expected Discharge Plan: Home/Self Care Barriers to Discharge: Continued Medical Work up  Expected Discharge Plan and Services Expected Discharge Plan: Home/Self Care In-house Referral: Clinical Social Work   Post Acute Care Choice: NA Living arrangements for the past 2 months: Single Family Home                                       Social Determinants of Health (SDOH) Interventions    Readmission Risk Interventions No flowsheet data found.

## 2020-04-14 NOTE — TOC Transition Note (Signed)
Transition of Care [facility]) - CM/SW Discharge Note   Patient Details  Name: Cameron Villarreal MRN: [id number] Date of Birth: October 29, 1965  Transition of Care [facility]) CM/SW Contact:  Natasha Bence, LCSW Phone Number: 04/14/2020, 1:15 PM   Clinical Narrative:    CSW notified that patient wish to d/c today. Cassandra with [facility] reported that patient's mother stated that they have a recliner at home for patient if discharged today until hospital bed arrives. CSW inquired with Cassandra of [facility] if patient would be able to have hospice nurse see patient today upon discharge. Cassandra confirmed that a hospice nurse will see patient upon discharge. TOC signing off.    Final next level of care: Home w Hospice Care Barriers to Discharge: Barriers Resolved   Patient Goals and CMS Choice Patient states their goals for this hospitalization and ongoing recovery are:: Return home with hospice CMS Medicare.gov Compare Post Acute Care list provided to:: Patient Choice offered to / list presented to : Patient  Discharge Placement                    Patient and family notified of of transfer: 04/14/20  Discharge Plan and Services In-house Referral: Clinical Social Work   Post Acute Care Choice: NA          DME Arranged: Other see comment ([facility] arranged all equipment) DME Agency: Other - Comment ([facility])       HH Arranged: NA HH Agency: NA        Social Determinants of Health (SDOH) Interventions     Readmission Risk Interventions No flowsheet data found.

## 2020-04-14 NOTE — Progress Notes (Signed)
Palliative: [name], Cameron Villarreal, is walking in the room with physical therapy as I enter.  He returns to sit on the bed and is a little winded.  He is alert and oriented, able to make his basic needs known.  He has a friend at bedside.  We talked about his symptom management.  He shares that he feels better with his Xanax for anxiety and morphine for breathlessness.  I remind him that these are as needed medicines and that he must ask to receive.  He shares that he has had to wait at times for breathing treatments and medicines.  I encouraged him to ask for medicine early.  Cameron Villarreal tells me that he is ready to go home.  Conference with attending, bedside nursing staff, transition of care team related to patient condition, needs, goals of care.  Plan: Continue to treat the treatable but no CPR or intubation.  Home with the benefits of "treat the treatable" hospice with hospice of [facility].  Recommendations for symptom management: Anxiety: Xanax 1 mg p.o. 3 times daily Breathlessness: MS Contin 15 mg twice daily MSIR 5 mg PO every 2 hours as needed for increased work of breathing/breathlessness  Symptom management per hospice protocol, once admitted.   46 minutes  [name], NP Palliative medicine team Team phone [phone] Greater than 50% of this time was spent counseling and coordinating care related to the above assessment and plan.

## 2020-04-29 ENCOUNTER — Emergency Department (HOSPITAL_COMMUNITY): Payer: Medicare Other

## 2020-04-29 ENCOUNTER — Emergency Department (HOSPITAL_COMMUNITY)
Admission: EM | Admit: 2020-04-29 | Discharge: 2020-04-29 | Payer: Medicare Other | Attending: Emergency Medicine | Admitting: Emergency Medicine

## 2020-04-29 DIAGNOSIS — R21 Rash and other nonspecific skin eruption: Secondary | ICD-10-CM | POA: Diagnosis not present

## 2020-04-29 DIAGNOSIS — R069 Unspecified abnormalities of breathing: Secondary | ICD-10-CM | POA: Diagnosis not present

## 2020-04-29 DIAGNOSIS — T2000XA Burn of unspecified degree of head, face, and neck, unspecified site, initial encounter: Secondary | ICD-10-CM | POA: Diagnosis not present

## 2020-04-29 DIAGNOSIS — T2004XA Burn of unspecified degree of nose (septum), initial encounter: Secondary | ICD-10-CM | POA: Diagnosis present

## 2020-04-29 DIAGNOSIS — R000 Tachycardia, unspecified: Secondary | ICD-10-CM | POA: Diagnosis not present

## 2020-04-29 DIAGNOSIS — Z20822 Contact with and (suspected) exposure to covid-19: Secondary | ICD-10-CM | POA: Diagnosis not present

## 2020-04-29 DIAGNOSIS — R918 Other nonspecific abnormal finding of lung field: Secondary | ICD-10-CM | POA: Diagnosis not present

## 2020-04-29 DIAGNOSIS — J441 Chronic obstructive pulmonary disease with (acute) exacerbation: Secondary | ICD-10-CM | POA: Diagnosis not present

## 2020-04-29 DIAGNOSIS — Z23 Encounter for immunization: Secondary | ICD-10-CM | POA: Diagnosis not present

## 2020-04-29 DIAGNOSIS — J430 Unilateral pulmonary emphysema [MacLeod's syndrome]: Secondary | ICD-10-CM | POA: Diagnosis not present

## 2020-04-29 DIAGNOSIS — J9621 Acute and chronic respiratory failure with hypoxia: Secondary | ICD-10-CM | POA: Diagnosis not present

## 2020-04-29 DIAGNOSIS — T300 Burn of unspecified body region, unspecified degree: Secondary | ICD-10-CM

## 2020-04-29 DIAGNOSIS — R600 Localized edema: Secondary | ICD-10-CM | POA: Insufficient documentation

## 2020-04-29 DIAGNOSIS — X088XXA Exposure to other specified smoke, fire and flames, initial encounter: Secondary | ICD-10-CM | POA: Insufficient documentation

## 2020-04-29 DIAGNOSIS — R404 Transient alteration of awareness: Secondary | ICD-10-CM | POA: Diagnosis not present

## 2020-04-29 DIAGNOSIS — Z978 Presence of other specified devices: Secondary | ICD-10-CM | POA: Diagnosis not present

## 2020-04-29 DIAGNOSIS — F17210 Nicotine dependence, cigarettes, uncomplicated: Secondary | ICD-10-CM | POA: Diagnosis not present

## 2020-04-29 DIAGNOSIS — Z743 Need for continuous supervision: Secondary | ICD-10-CM | POA: Diagnosis not present

## 2020-04-29 DIAGNOSIS — Z7951 Long term (current) use of inhaled steroids: Secondary | ICD-10-CM | POA: Diagnosis not present

## 2020-04-29 DIAGNOSIS — R69 Illness, unspecified: Secondary | ICD-10-CM | POA: Diagnosis not present

## 2020-04-29 DIAGNOSIS — Y999 Unspecified external cause status: Secondary | ICD-10-CM | POA: Diagnosis not present

## 2020-04-29 DIAGNOSIS — R062 Wheezing: Secondary | ICD-10-CM | POA: Diagnosis not present

## 2020-04-29 DIAGNOSIS — J439 Emphysema, unspecified: Secondary | ICD-10-CM | POA: Diagnosis not present

## 2020-04-29 LAB — CBC WITH DIFFERENTIAL/PLATELET
Abs Immature Granulocytes: 0.07 10*3/uL (ref 0.00–0.07)
Basophils Absolute: 0.1 10*3/uL (ref 0.0–0.1)
Basophils Relative: 0 %
Eosinophils Absolute: 0.1 10*3/uL (ref 0.0–0.5)
Eosinophils Relative: 1 %
HCT: 46.1 % (ref 39.0–52.0)
Hemoglobin: 13.7 g/dL (ref 13.0–17.0)
Immature Granulocytes: 0 %
Lymphocytes Relative: 17 %
Lymphs Abs: 2.6 10*3/uL (ref 0.7–4.0)
MCH: 29.4 pg (ref 26.0–34.0)
MCHC: 29.7 g/dL — ABNORMAL LOW (ref 30.0–36.0)
MCV: 98.9 fL (ref 80.0–100.0)
Monocytes Absolute: 2.1 10*3/uL — ABNORMAL HIGH (ref 0.1–1.0)
Monocytes Relative: 13 %
Neutro Abs: 10.9 10*3/uL — ABNORMAL HIGH (ref 1.7–7.7)
Neutrophils Relative %: 69 %
Platelets: 167 10*3/uL (ref 150–400)
RBC: 4.66 MIL/uL (ref 4.22–5.81)
RDW: 14.9 % (ref 11.5–15.5)
WBC: 15.9 10*3/uL — ABNORMAL HIGH (ref 4.0–10.5)
nRBC: 0 % (ref 0.0–0.2)

## 2020-04-29 LAB — COMPREHENSIVE METABOLIC PANEL
ALT: 22 U/L (ref 0–44)
AST: 16 U/L (ref 15–41)
Albumin: 2.9 g/dL — ABNORMAL LOW (ref 3.5–5.0)
Alkaline Phosphatase: 60 U/L (ref 38–126)
Anion gap: 5 (ref 5–15)
BUN: 9 mg/dL (ref 6–20)
CO2: 37 mmol/L — ABNORMAL HIGH (ref 22–32)
Calcium: 7.9 mg/dL — ABNORMAL LOW (ref 8.9–10.3)
Chloride: 96 mmol/L — ABNORMAL LOW (ref 98–111)
Creatinine, Ser: 0.37 mg/dL — ABNORMAL LOW (ref 0.61–1.24)
GFR, Estimated: >60 mL/min (ref >60)
Glucose, Bld: 111 mg/dL — ABNORMAL HIGH (ref 70–99)
Potassium: 4.1 mmol/L (ref 3.5–5.1)
Sodium: 138 mmol/L (ref 135–145)
Total Bilirubin: 0.4 mg/dL (ref 0.3–1.2)
Total Protein: 5.4 g/dL — ABNORMAL LOW (ref 6.5–8.1)

## 2020-04-29 LAB — PROTIME-INR
INR: 1.1 (ref 0.8–1.2)
Prothrombin Time: 13.8 seconds (ref 11.4–15.2)

## 2020-04-29 MED ORDER — LORAZEPAM 2 MG/ML IJ SOLN
2.0000 mg | Freq: Once | INTRAMUSCULAR | Status: AC
  Administered 2020-04-29: 2 mg via INTRAVENOUS
  Filled 2020-04-29: qty 1

## 2020-04-29 MED ORDER — LACTATED RINGERS IV BOLUS
1000.0000 mL | Freq: Once | INTRAVENOUS | Status: AC
  Administered 2020-04-29: 1000 mL via INTRAVENOUS

## 2020-04-29 MED ORDER — FENTANYL CITRATE (PF) 100 MCG/2ML IJ SOLN: 100.0000 ug | INTRAMUSCULAR | Status: DC | PRN

## 2020-04-29 MED ORDER — PROPOFOL 1000 MG/100ML IV EMUL
INTRAVENOUS | Status: AC
  Administered 2020-04-29: 5 ug/kg/min via INTRAVENOUS
  Filled 2020-04-29: qty 100

## 2020-04-29 MED ORDER — FENTANYL 2500MCG IN NS 250ML (10MCG/ML) PREMIX INFUSION
25.0000 ug/h | INTRAVENOUS | Status: DC
  Administered 2020-04-29: 50 ug/h via INTRAVENOUS
  Filled 2020-04-29: qty 250

## 2020-04-29 MED ORDER — TETANUS-DIPHTH-ACELL PERTUSSIS 5-2.5-18.5 LF-MCG/0.5 IM SUSY
0.5000 mL | PREFILLED_SYRINGE | Freq: Once | INTRAMUSCULAR | Status: AC
  Administered 2020-04-29: 0.5 mL via INTRAMUSCULAR
  Filled 2020-04-29: qty 0.5

## 2020-04-29 MED ORDER — FENTANYL CITRATE (PF) 100 MCG/2ML IJ SOLN
100.0000 ug | INTRAMUSCULAR | Status: DC | PRN
  Filled 2020-04-29: qty 2

## 2020-04-29 MED ORDER — MIDAZOLAM HCL 2 MG/2ML IJ SOLN
2.0000 mg | INTRAMUSCULAR | Status: DC | PRN
Start: 2020-04-29 — End: 2020-04-30

## 2020-04-29 MED ORDER — SODIUM CHLORIDE 0.9 % IV BOLUS
1000.0000 mL | Freq: Once | INTRAVENOUS | Status: AC
  Administered 2020-04-29: 1000 mL via INTRAVENOUS

## 2020-04-29 MED ORDER — FENTANYL CITRATE (PF) 100 MCG/2ML IJ SOLN
50.0000 ug | Freq: Once | INTRAMUSCULAR | Status: AC
  Administered 2020-04-29: 50 ug via INTRAVENOUS
  Filled 2020-04-29: qty 2

## 2020-04-29 MED ORDER — ROCURONIUM BROMIDE 50 MG/5ML IV SOLN
1.0000 mg/kg | Freq: Once | INTRAVENOUS | Status: AC
  Administered 2020-04-29: 55 mg via INTRAVENOUS

## 2020-04-29 MED ORDER — FENTANYL BOLUS VIA INFUSION
50.0000 ug | INTRAVENOUS | Status: DC | PRN
  Filled 2020-04-29: qty 50

## 2020-04-29 MED ORDER — MORPHINE SULFATE (PF) 2 MG/ML IV SOLN
8.0000 mg | Freq: Once | INTRAVENOUS | Status: DC
  Filled 2020-04-29: qty 4

## 2020-04-29 MED ORDER — PROPOFOL 1000 MG/100ML IV EMUL: 0.0000 ug/kg/min | INTRAVENOUS | Status: DC

## 2020-04-29 MED ORDER — FENTANYL CITRATE (PF) 100 MCG/2ML IJ SOLN
50.0000 ug | Freq: Once | INTRAMUSCULAR | Status: AC
  Administered 2020-04-29: 50 ug via INTRAVENOUS

## 2020-04-29 MED ORDER — PROPOFOL 1000 MG/100ML IV EMUL
0.0000 ug/kg/min | INTRAVENOUS | Status: DC
  Administered 2020-04-29: 15 ug/kg/min via INTRAVENOUS

## 2020-04-29 MED ORDER — MIDAZOLAM HCL 2 MG/2ML IJ SOLN
2.0000 mg | INTRAMUSCULAR | Status: DC | PRN
  Administered 2020-04-29: 2 mg via INTRAVENOUS
  Filled 2020-04-29 (×3): qty 2

## 2020-04-29 NOTE — Progress Notes (Signed)
Patient bagged with BVM after intubation until EMS packed him up on stretcher to transport to [facility] in [place]. Tube positioning good on chest x ray.

## 2020-04-29 NOTE — ED Notes (Signed)
Pt being transported off unit at this time.
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
## 2020-04-29 NOTE — ED Notes (Addendum)
7.0 ETT placed @ 2058 + color change Bilateral breath sounds 25 @ lip

## 2020-04-30 DIAGNOSIS — T2009XA Burn of unspecified degree of multiple sites of head, face, and neck, initial encounter: Secondary | ICD-10-CM | POA: Diagnosis not present

## 2020-04-30 DIAGNOSIS — Z978 Presence of other specified devices: Secondary | ICD-10-CM | POA: Diagnosis not present

## 2020-04-30 DIAGNOSIS — R69 Illness, unspecified: Secondary | ICD-10-CM | POA: Diagnosis not present

## 2020-04-30 DIAGNOSIS — R918 Other nonspecific abnormal finding of lung field: Secondary | ICD-10-CM | POA: Diagnosis not present

## 2020-04-30 DIAGNOSIS — J9622 Acute and chronic respiratory failure with hypercapnia: Secondary | ICD-10-CM | POA: Diagnosis not present

## 2020-04-30 DIAGNOSIS — Z4659 Encounter for fitting and adjustment of other gastrointestinal appliance and device: Secondary | ICD-10-CM | POA: Diagnosis not present

## 2020-04-30 DIAGNOSIS — Z9981 Dependence on supplemental oxygen: Secondary | ICD-10-CM | POA: Diagnosis not present

## 2020-04-30 DIAGNOSIS — Z9911 Dependence on respirator [ventilator] status: Secondary | ICD-10-CM | POA: Diagnosis not present

## 2020-04-30 DIAGNOSIS — Z20822 Contact with and (suspected) exposure to covid-19: Secondary | ICD-10-CM | POA: Diagnosis not present

## 2020-04-30 DIAGNOSIS — D72829 Elevated white blood cell count, unspecified: Secondary | ICD-10-CM | POA: Diagnosis not present

## 2020-04-30 DIAGNOSIS — X088XXA Exposure to other specified smoke, fire and flames, initial encounter: Secondary | ICD-10-CM | POA: Diagnosis not present

## 2020-04-30 DIAGNOSIS — J9621 Acute and chronic respiratory failure with hypoxia: Secondary | ICD-10-CM | POA: Diagnosis not present

## 2020-04-30 DIAGNOSIS — G8929 Other chronic pain: Secondary | ICD-10-CM | POA: Diagnosis not present

## 2020-04-30 DIAGNOSIS — J441 Chronic obstructive pulmonary disease with (acute) exacerbation: Secondary | ICD-10-CM | POA: Diagnosis not present

## 2020-04-30 DIAGNOSIS — J705 Respiratory conditions due to smoke inhalation: Secondary | ICD-10-CM | POA: Diagnosis not present

## 2020-04-30 DIAGNOSIS — X000XXA Exposure to flames in uncontrolled fire in building or structure, initial encounter: Secondary | ICD-10-CM | POA: Diagnosis not present

## 2020-04-30 DIAGNOSIS — J9601 Acute respiratory failure with hypoxia: Secondary | ICD-10-CM | POA: Diagnosis not present

## 2020-04-30 NOTE — ED Notes (Signed)
04/29/2020 0100 Late Entry 2mg /26mL IV Versed wasted in sharps [employer], RN witness.

## 2020-04-30 NOTE — ED Notes (Signed)
Vitals during transport. Late Entry:  2200: BP 101/75 HR 112 SPO2 100% RR 12 2207: BP 113/54 HR 112 SPO2 100% RR 12 2212: BP 125/87 HR 107 SPO2 100% RR 12 2217: BP 117/88 HR 101 SPO2 100% RR 12 2222: BP 119/86 HR 108 SPO2 100% RR 12 2227: BP 117/86 HR 97 SPO2 100% RR 12 2232: BP 131/89 HR 103 SPO2 100% RR 12 2237: HR 98 SPO2 100%

## 2020-04-30 NOTE — ED Notes (Signed)
Late Entry: 04/30/2020 0724  PRN medications ordered by EDP removed from pyxis for transport with EMS to maintain appropriate sedation. Medications not administered returned to [facility] ED, given to dayshift RN to be returned to pharmacy. AC aware. Medications to be returned to pharmacy: 8 mg Morphine 2 mg Versed 100 mcg Fentanyl. Provided to [employer].

## 2020-04-30 NOTE — ED Notes (Addendum)
Late Entry - 04/30/20 8208   This RN received Morphine 8mg , Fentanyl 15mcg, and Versed 2mg  injection from Esmond Plants, RN at this time. Alize attempted to return these meds that were brought on board EMS during transfer to [facility] Upon her arrival back to [facility], she was unable to return medications to pyxis. Pharmacy arrives at 0730 and this RN will speak with pharmacist about how to return medications.

## 2020-04-30 NOTE — ED Notes (Signed)
04/29/20 0100 Late Entry:  This RN witnessed 2 mg (2 ml) Versed being wasted with Esmond Plants, RN into sharps container.

## 2020-05-01 DIAGNOSIS — R69 Illness, unspecified: Secondary | ICD-10-CM | POA: Diagnosis not present

## 2020-05-01 DIAGNOSIS — T2009XA Burn of unspecified degree of multiple sites of head, face, and neck, initial encounter: Secondary | ICD-10-CM | POA: Diagnosis not present

## 2020-05-01 DIAGNOSIS — Z9911 Dependence on respirator [ventilator] status: Secondary | ICD-10-CM | POA: Diagnosis not present

## 2020-05-01 DIAGNOSIS — J9622 Acute and chronic respiratory failure with hypercapnia: Secondary | ICD-10-CM | POA: Diagnosis not present

## 2020-05-01 DIAGNOSIS — J9621 Acute and chronic respiratory failure with hypoxia: Secondary | ICD-10-CM | POA: Diagnosis not present

## 2020-05-01 DIAGNOSIS — D72829 Elevated white blood cell count, unspecified: Secondary | ICD-10-CM | POA: Diagnosis not present

## 2020-05-01 DIAGNOSIS — J441 Chronic obstructive pulmonary disease with (acute) exacerbation: Secondary | ICD-10-CM | POA: Diagnosis not present

## 2020-05-01 DIAGNOSIS — Z9981 Dependence on supplemental oxygen: Secondary | ICD-10-CM | POA: Diagnosis not present

## 2020-05-01 DIAGNOSIS — X000XXA Exposure to flames in uncontrolled fire in building or structure, initial encounter: Secondary | ICD-10-CM | POA: Diagnosis not present

## 2020-05-02 DIAGNOSIS — R69 Illness, unspecified: Secondary | ICD-10-CM | POA: Diagnosis not present

## 2020-05-02 DIAGNOSIS — J9621 Acute and chronic respiratory failure with hypoxia: Secondary | ICD-10-CM | POA: Diagnosis not present

## 2020-05-02 DIAGNOSIS — T2009XA Burn of unspecified degree of multiple sites of head, face, and neck, initial encounter: Secondary | ICD-10-CM | POA: Diagnosis not present

## 2020-05-02 DIAGNOSIS — Z9981 Dependence on supplemental oxygen: Secondary | ICD-10-CM | POA: Diagnosis not present

## 2020-05-02 DIAGNOSIS — J441 Chronic obstructive pulmonary disease with (acute) exacerbation: Secondary | ICD-10-CM | POA: Diagnosis not present

## 2020-05-02 DIAGNOSIS — X088XXA Exposure to other specified smoke, fire and flames, initial encounter: Secondary | ICD-10-CM | POA: Diagnosis not present

## 2020-05-02 DIAGNOSIS — J9622 Acute and chronic respiratory failure with hypercapnia: Secondary | ICD-10-CM | POA: Diagnosis not present

## 2020-05-04 ENCOUNTER — Telehealth: Payer: Self-pay

## 2020-05-04 DIAGNOSIS — X088XXA Exposure to other specified smoke, fire and flames, initial encounter: Secondary | ICD-10-CM | POA: Diagnosis not present

## 2020-05-04 DIAGNOSIS — T2009XA Burn of unspecified degree of multiple sites of head, face, and neck, initial encounter: Secondary | ICD-10-CM | POA: Diagnosis not present

## 2020-05-04 DIAGNOSIS — J441 Chronic obstructive pulmonary disease with (acute) exacerbation: Secondary | ICD-10-CM | POA: Diagnosis not present

## 2020-05-04 DIAGNOSIS — R69 Illness, unspecified: Secondary | ICD-10-CM | POA: Diagnosis not present

## 2020-05-04 DIAGNOSIS — J9621 Acute and chronic respiratory failure with hypoxia: Secondary | ICD-10-CM | POA: Diagnosis not present

## 2020-05-04 DIAGNOSIS — Z9981 Dependence on supplemental oxygen: Secondary | ICD-10-CM | POA: Diagnosis not present

## 2020-05-04 DIAGNOSIS — J9622 Acute and chronic respiratory failure with hypercapnia: Secondary | ICD-10-CM | POA: Diagnosis not present

## 2020-05-04 NOTE — Telephone Encounter (Signed)
Please advise if you will be attending?

## 2020-05-04 NOTE — Telephone Encounter (Signed)
Yes.  Thank you.

## 2020-05-04 NOTE — Telephone Encounter (Signed)
Beth at hospice notified

## 2020-05-04 NOTE — Telephone Encounter (Signed)
Beth is calling, lvm that the pt is being discharged and wants to be seen by Hospice,  Would Dr Posey Pronto be the attending   Please call

## 2020-05-05 ENCOUNTER — Other Ambulatory Visit: Payer: Self-pay | Admitting: Pulmonary Disease

## 2020-05-05 DIAGNOSIS — G8929 Other chronic pain: Secondary | ICD-10-CM

## 2020-05-06 ENCOUNTER — Other Ambulatory Visit: Payer: Self-pay | Admitting: Internal Medicine

## 2020-05-06 ENCOUNTER — Telehealth: Payer: Self-pay

## 2020-05-06 DIAGNOSIS — Z515 Encounter for palliative care: Secondary | ICD-10-CM

## 2020-05-06 DIAGNOSIS — G894 Chronic pain syndrome: Secondary | ICD-10-CM

## 2020-05-06 MED ORDER — MORPHINE SULFATE ER 15 MG PO TBCR
15.0000 mg | EXTENDED_RELEASE_TABLET | Freq: Two times a day (BID) | ORAL | 0 refills | Status: AC
Start: 2020-05-06 — End: ?

## 2020-05-06 NOTE — Telephone Encounter (Signed)
Please advise 

## 2020-05-06 NOTE — Telephone Encounter (Signed)
Pt had ms contin, 15mg  every 12 hours, and he is out and needs a refill sent to [facility]

## 2020-05-06 NOTE — Telephone Encounter (Signed)
Hospice nurse notified. 

## 2020-05-06 NOTE — Telephone Encounter (Signed)
Refilled

## 2020-05-15 ENCOUNTER — Emergency Department (HOSPITAL_COMMUNITY)

## 2020-05-15 ENCOUNTER — Encounter (HOSPITAL_COMMUNITY): Payer: Self-pay | Admitting: Emergency Medicine

## 2020-05-15 ENCOUNTER — Other Ambulatory Visit: Payer: Self-pay

## 2020-05-15 ENCOUNTER — Inpatient Hospital Stay (HOSPITAL_COMMUNITY)
Admission: EM | Admit: 2020-05-15 | Discharge: 2020-05-22 | DRG: 981 | Disposition: A | Discharge status: Hospice | Attending: Internal Medicine | Admitting: Internal Medicine

## 2020-05-15 DIAGNOSIS — Y929 Unspecified place or not applicable: Secondary | ICD-10-CM

## 2020-05-15 DIAGNOSIS — F419 Anxiety disorder, unspecified: Secondary | ICD-10-CM | POA: Diagnosis present

## 2020-05-15 DIAGNOSIS — F17210 Nicotine dependence, cigarettes, uncomplicated: Secondary | ICD-10-CM | POA: Diagnosis present

## 2020-05-15 DIAGNOSIS — K2100 Gastro-esophageal reflux disease with esophagitis, without bleeding: Secondary | ICD-10-CM | POA: Diagnosis present

## 2020-05-15 DIAGNOSIS — S72141A Displaced intertrochanteric fracture of right femur, initial encounter for closed fracture: Secondary | ICD-10-CM | POA: Diagnosis present

## 2020-05-15 DIAGNOSIS — Z885 Allergy status to narcotic agent status: Secondary | ICD-10-CM | POA: Diagnosis not present

## 2020-05-15 DIAGNOSIS — J9621 Acute and chronic respiratory failure with hypoxia: Secondary | ICD-10-CM | POA: Diagnosis present

## 2020-05-15 DIAGNOSIS — W1830XA Fall on same level, unspecified, initial encounter: Secondary | ICD-10-CM | POA: Diagnosis present

## 2020-05-15 DIAGNOSIS — D62 Acute posthemorrhagic anemia: Secondary | ICD-10-CM | POA: Diagnosis not present

## 2020-05-15 DIAGNOSIS — Z79899 Other long term (current) drug therapy: Secondary | ICD-10-CM

## 2020-05-15 DIAGNOSIS — D72829 Elevated white blood cell count, unspecified: Secondary | ICD-10-CM | POA: Diagnosis present

## 2020-05-15 DIAGNOSIS — S42401A Unspecified fracture of lower end of right humerus, initial encounter for closed fracture: Secondary | ICD-10-CM | POA: Diagnosis present

## 2020-05-15 DIAGNOSIS — S42291A Other displaced fracture of upper end of right humerus, initial encounter for closed fracture: Secondary | ICD-10-CM | POA: Diagnosis not present

## 2020-05-15 DIAGNOSIS — S42211A Unspecified displaced fracture of surgical neck of right humerus, initial encounter for closed fracture: Secondary | ICD-10-CM | POA: Diagnosis not present

## 2020-05-15 DIAGNOSIS — W19XXXA Unspecified fall, initial encounter: Secondary | ICD-10-CM

## 2020-05-15 DIAGNOSIS — I708 Atherosclerosis of other arteries: Secondary | ICD-10-CM | POA: Diagnosis not present

## 2020-05-15 DIAGNOSIS — R911 Solitary pulmonary nodule: Secondary | ICD-10-CM | POA: Diagnosis not present

## 2020-05-15 DIAGNOSIS — S42491S Other displaced fracture of lower end of right humerus, sequela: Secondary | ICD-10-CM | POA: Diagnosis not present

## 2020-05-15 DIAGNOSIS — Z20822 Contact with and (suspected) exposure to covid-19: Secondary | ICD-10-CM | POA: Diagnosis present

## 2020-05-15 DIAGNOSIS — J441 Chronic obstructive pulmonary disease with (acute) exacerbation: Secondary | ICD-10-CM | POA: Diagnosis present

## 2020-05-15 DIAGNOSIS — R000 Tachycardia, unspecified: Secondary | ICD-10-CM | POA: Diagnosis not present

## 2020-05-15 DIAGNOSIS — K219 Gastro-esophageal reflux disease without esophagitis: Secondary | ICD-10-CM | POA: Diagnosis present

## 2020-05-15 DIAGNOSIS — R0603 Acute respiratory distress: Secondary | ICD-10-CM | POA: Diagnosis not present

## 2020-05-15 DIAGNOSIS — E7800 Pure hypercholesterolemia, unspecified: Secondary | ICD-10-CM | POA: Diagnosis present

## 2020-05-15 DIAGNOSIS — J9622 Acute and chronic respiratory failure with hypercapnia: Secondary | ICD-10-CM | POA: Diagnosis present

## 2020-05-15 DIAGNOSIS — E559 Vitamin D deficiency, unspecified: Secondary | ICD-10-CM | POA: Diagnosis not present

## 2020-05-15 DIAGNOSIS — T380X5A Adverse effect of glucocorticoids and synthetic analogues, initial encounter: Secondary | ICD-10-CM | POA: Diagnosis present

## 2020-05-15 DIAGNOSIS — J432 Centrilobular emphysema: Principal | ICD-10-CM | POA: Diagnosis present

## 2020-05-15 DIAGNOSIS — R52 Pain, unspecified: Secondary | ICD-10-CM | POA: Diagnosis not present

## 2020-05-15 DIAGNOSIS — S0219XA Other fracture of base of skull, initial encounter for closed fracture: Secondary | ICD-10-CM | POA: Diagnosis not present

## 2020-05-15 DIAGNOSIS — Z7952 Long term (current) use of systemic steroids: Secondary | ICD-10-CM

## 2020-05-15 DIAGNOSIS — S72001A Fracture of unspecified part of neck of right femur, initial encounter for closed fracture: Secondary | ICD-10-CM | POA: Diagnosis not present

## 2020-05-15 DIAGNOSIS — S40021A Contusion of right upper arm, initial encounter: Secondary | ICD-10-CM | POA: Diagnosis present

## 2020-05-15 DIAGNOSIS — M25552 Pain in left hip: Secondary | ICD-10-CM | POA: Diagnosis not present

## 2020-05-15 DIAGNOSIS — G4710 Hypersomnia, unspecified: Secondary | ICD-10-CM | POA: Diagnosis present

## 2020-05-15 DIAGNOSIS — G894 Chronic pain syndrome: Secondary | ICD-10-CM | POA: Diagnosis present

## 2020-05-15 DIAGNOSIS — G4700 Insomnia, unspecified: Secondary | ICD-10-CM | POA: Diagnosis present

## 2020-05-15 DIAGNOSIS — Z66 Do not resuscitate: Secondary | ICD-10-CM | POA: Diagnosis present

## 2020-05-15 DIAGNOSIS — R0602 Shortness of breath: Secondary | ICD-10-CM | POA: Diagnosis not present

## 2020-05-15 DIAGNOSIS — J449 Chronic obstructive pulmonary disease, unspecified: Secondary | ICD-10-CM | POA: Diagnosis not present

## 2020-05-15 DIAGNOSIS — M25551 Pain in right hip: Secondary | ICD-10-CM | POA: Diagnosis not present

## 2020-05-15 DIAGNOSIS — J322 Chronic ethmoidal sinusitis: Secondary | ICD-10-CM | POA: Diagnosis not present

## 2020-05-15 DIAGNOSIS — Z419 Encounter for procedure for purposes other than remedying health state, unspecified: Secondary | ICD-10-CM

## 2020-05-15 DIAGNOSIS — R5381 Other malaise: Secondary | ICD-10-CM | POA: Diagnosis not present

## 2020-05-15 DIAGNOSIS — Z7189 Other specified counseling: Secondary | ICD-10-CM | POA: Diagnosis not present

## 2020-05-15 DIAGNOSIS — S42201A Unspecified fracture of upper end of right humerus, initial encounter for closed fracture: Secondary | ICD-10-CM | POA: Diagnosis present

## 2020-05-15 DIAGNOSIS — Z515 Encounter for palliative care: Secondary | ICD-10-CM | POA: Diagnosis not present

## 2020-05-15 DIAGNOSIS — J321 Chronic frontal sinusitis: Secondary | ICD-10-CM | POA: Diagnosis not present

## 2020-05-15 DIAGNOSIS — T148XXA Other injury of unspecified body region, initial encounter: Secondary | ICD-10-CM

## 2020-05-15 DIAGNOSIS — Z743 Need for continuous supervision: Secondary | ICD-10-CM | POA: Diagnosis not present

## 2020-05-15 LAB — COMPREHENSIVE METABOLIC PANEL
ALT: 24 U/L (ref 0–44)
AST: 19 U/L (ref 15–41)
Albumin: 3.6 g/dL (ref 3.5–5.0)
Alkaline Phosphatase: 72 U/L (ref 38–126)
Anion gap: 10 (ref 5–15)
BUN: 8 mg/dL (ref 6–20)
CO2: 33 mmol/L — ABNORMAL HIGH (ref 22–32)
Calcium: 8.8 mg/dL — ABNORMAL LOW (ref 8.9–10.3)
Chloride: 93 mmol/L — ABNORMAL LOW (ref 98–111)
Creatinine, Ser: 0.75 mg/dL (ref 0.61–1.24)
GFR, Estimated: >60 mL/min (ref >60)
Glucose, Bld: 157 mg/dL — ABNORMAL HIGH (ref 70–99)
Potassium: 4.8 mmol/L (ref 3.5–5.1)
Sodium: 136 mmol/L (ref 135–145)
Total Bilirubin: 0.7 mg/dL (ref 0.3–1.2)
Total Protein: 6.6 g/dL (ref 6.5–8.1)

## 2020-05-15 LAB — CBC WITH DIFFERENTIAL/PLATELET
Abs Immature Granulocytes: 0.14 10*3/uL — ABNORMAL HIGH (ref 0.00–0.07)
Basophils Absolute: 0.1 10*3/uL (ref 0.0–0.1)
Basophils Relative: 1 %
Eosinophils Absolute: 0.1 10*3/uL (ref 0.0–0.5)
Eosinophils Relative: 0 %
HCT: 48.9 % (ref 39.0–52.0)
Hemoglobin: 14.9 g/dL (ref 13.0–17.0)
Immature Granulocytes: 1 %
Lymphocytes Relative: 10 %
Lymphs Abs: 1.6 10*3/uL (ref 0.7–4.0)
MCH: 29.5 pg (ref 26.0–34.0)
MCHC: 30.5 g/dL (ref 30.0–36.0)
MCV: 96.8 fL (ref 80.0–100.0)
Monocytes Absolute: 2.1 10*3/uL — ABNORMAL HIGH (ref 0.1–1.0)
Monocytes Relative: 12 %
Neutro Abs: 12.8 10*3/uL — ABNORMAL HIGH (ref 1.7–7.7)
Neutrophils Relative %: 76 %
Platelets: 354 10*3/uL (ref 150–400)
RBC: 5.05 MIL/uL (ref 4.22–5.81)
RDW: 15.3 % (ref 11.5–15.5)
WBC: 16.9 10*3/uL — ABNORMAL HIGH (ref 4.0–10.5)
nRBC: 0 % (ref 0.0–0.2)

## 2020-05-15 LAB — BLOOD GAS, ARTERIAL
Acid-Base Excess: 9.5 mmol/L — ABNORMAL HIGH (ref 0.0–2.0)
Acid-Base Excess: 6.5 mmol/L — ABNORMAL HIGH (ref 0.0–2.0)
Bicarbonate: 30.3 mmol/L — ABNORMAL HIGH (ref 20.0–28.0)
Bicarbonate: 31.9 mmol/L — ABNORMAL HIGH (ref 20.0–28.0)
Drawn by: 36496
FIO2: 100
FIO2: 40
O2 Saturation: 90.4 %
O2 Saturation: 95.6 %
Patient temperature: 37
Patient temperature: 37.5
pCO2 arterial: 78 mmHg (ref 32.0–48.0)
pCO2 arterial: 60.8 mmHg — ABNORMAL HIGH (ref 32.0–48.0)
pH, Arterial: 7.287 — ABNORMAL LOW (ref 7.350–7.450)
pH, Arterial: 7.343 — ABNORMAL LOW (ref 7.350–7.450)
pO2, Arterial: 66.7 mmHg — ABNORMAL LOW (ref 83.0–108.0)
pO2, Arterial: 83.5 mmHg (ref 83.0–108.0)

## 2020-05-15 LAB — RESP PANEL BY RT-PCR (FLU A&B, COVID) ARPGX2
Influenza A by PCR: NEGATIVE
Influenza B by PCR: NEGATIVE
SARS Coronavirus 2 by RT PCR: NEGATIVE

## 2020-05-15 LAB — LIPASE, BLOOD: Lipase: 19 U/L (ref 11–51)

## 2020-05-15 LAB — ETHANOL: Alcohol, Ethyl (B): <10 mg/dL (ref <10)

## 2020-05-15 MED ORDER — HYDROMORPHONE HCL 1 MG/ML IJ SOLN
1.0000 mg | INTRAMUSCULAR | Status: DC | PRN
  Administered 2020-05-16 – 2020-05-17 (×8): 1 mg via INTRAVENOUS
  Filled 2020-05-15 (×8): qty 1

## 2020-05-15 MED ORDER — GUAIFENESIN ER 600 MG PO TB12
600.0000 mg | ORAL_TABLET | Freq: Two times a day (BID) | ORAL | Status: DC
  Administered 2020-05-16 – 2020-05-22 (×12): 600 mg via ORAL
  Filled 2020-05-15 (×13): qty 1

## 2020-05-15 MED ORDER — SODIUM CHLORIDE 0.9 % IV SOLN
1.0000 g | INTRAVENOUS | Status: DC
  Administered 2020-05-16: 1 g via INTRAVENOUS
  Filled 2020-05-15: qty 1

## 2020-05-15 MED ORDER — PREDNISONE 20 MG PO TABS: 40.0000 mg | ORAL_TABLET | Freq: Every day | ORAL | Status: DC

## 2020-05-15 MED ORDER — ALPRAZOLAM 0.5 MG PO TABS
0.5000 mg | ORAL_TABLET | Freq: Three times a day (TID) | ORAL | Status: DC | PRN
  Administered 2020-05-17 – 2020-05-21 (×7): 0.5 mg via ORAL
  Filled 2020-05-15 (×8): qty 1

## 2020-05-15 MED ORDER — SODIUM CHLORIDE 0.9 % IV SOLN: INTRAVENOUS | Status: DC

## 2020-05-15 MED ORDER — BUDESON-GLYCOPYRROL-FORMOTEROL 160-9-4.8 MCG/ACT IN AERO: 2.0000 | INHALATION_SPRAY | Freq: Two times a day (BID) | RESPIRATORY_TRACT | Status: DC

## 2020-05-15 MED ORDER — GABAPENTIN 300 MG PO CAPS
300.0000 mg | ORAL_CAPSULE | Freq: Three times a day (TID) | ORAL | Status: DC
  Administered 2020-05-16 – 2020-05-22 (×17): 300 mg via ORAL
  Filled 2020-05-15 (×18): qty 1

## 2020-05-15 MED ORDER — SENNOSIDES-DOCUSATE SODIUM 8.6-50 MG PO TABS: 1.0000 | ORAL_TABLET | Freq: Every evening | ORAL | Status: DC | PRN

## 2020-05-15 MED ORDER — SODIUM CHLORIDE 0.9 % IV BOLUS
1000.0000 mL | Freq: Once | INTRAVENOUS | Status: AC
  Administered 2020-05-15: 1000 mL via INTRAVENOUS

## 2020-05-15 MED ORDER — ONDANSETRON HCL 4 MG/2ML IJ SOLN: 4.0000 mg | Freq: Four times a day (QID) | INTRAMUSCULAR | Status: DC | PRN

## 2020-05-15 MED ORDER — UMECLIDINIUM BROMIDE 62.5 MCG/INH IN AEPB
1.0000 | INHALATION_SPRAY | Freq: Every day | RESPIRATORY_TRACT | Status: DC
  Administered 2020-05-17 – 2020-05-22 (×5): 1 via RESPIRATORY_TRACT
  Filled 2020-05-15: qty 7

## 2020-05-15 MED ORDER — INSULIN ASPART 100 UNIT/ML ~~LOC~~ SOLN
0.0000 [IU] | Freq: Three times a day (TID) | SUBCUTANEOUS | Status: DC
  Administered 2020-05-16: 2 [IU] via SUBCUTANEOUS
  Administered 2020-05-16: 3 [IU] via SUBCUTANEOUS
  Administered 2020-05-17: 2 [IU] via SUBCUTANEOUS
  Administered 2020-05-17: 1 [IU] via SUBCUTANEOUS
  Administered 2020-05-17 – 2020-05-18 (×2): 2 [IU] via SUBCUTANEOUS
  Administered 2020-05-18 (×2): 3 [IU] via SUBCUTANEOUS
  Administered 2020-05-19: 2 [IU] via SUBCUTANEOUS
  Administered 2020-05-19 – 2020-05-20 (×3): 3 [IU] via SUBCUTANEOUS
  Administered 2020-05-20: 2 [IU] via SUBCUTANEOUS
  Administered 2020-05-21: 3 [IU] via SUBCUTANEOUS
  Administered 2020-05-21: 2 [IU] via SUBCUTANEOUS

## 2020-05-15 MED ORDER — IPRATROPIUM-ALBUTEROL 0.5-2.5 (3) MG/3ML IN SOLN
3.0000 mL | Freq: Four times a day (QID) | RESPIRATORY_TRACT | Status: DC
  Administered 2020-05-16 (×2): 3 mL via RESPIRATORY_TRACT
  Filled 2020-05-15 (×2): qty 3

## 2020-05-15 MED ORDER — METHYLPREDNISOLONE SODIUM SUCC 125 MG IJ SOLR
60.0000 mg | Freq: Four times a day (QID) | INTRAMUSCULAR | Status: DC
  Administered 2020-05-16 (×2): 60 mg via INTRAVENOUS
  Filled 2020-05-15 (×3): qty 2

## 2020-05-15 MED ORDER — FLUTICASONE FUROATE-VILANTEROL 100-25 MCG/INH IN AEPB
1.0000 | INHALATION_SPRAY | Freq: Every day | RESPIRATORY_TRACT | Status: DC
  Administered 2020-05-17 – 2020-05-22 (×5): 1 via RESPIRATORY_TRACT
  Filled 2020-05-15: qty 28

## 2020-05-15 MED ORDER — ALBUTEROL SULFATE (2.5 MG/3ML) 0.083% IN NEBU: 2.5000 mg | INHALATION_SOLUTION | RESPIRATORY_TRACT | Status: DC | PRN

## 2020-05-15 MED ORDER — MORPHINE SULFATE ER 15 MG PO TBCR
15.0000 mg | EXTENDED_RELEASE_TABLET | Freq: Two times a day (BID) | ORAL | Status: DC
  Administered 2020-05-16 – 2020-05-20 (×8): 15 mg via ORAL
  Filled 2020-05-15 (×9): qty 1

## 2020-05-15 MED ORDER — ONDANSETRON HCL 4 MG PO TABS: 4.0000 mg | ORAL_TABLET | Freq: Four times a day (QID) | ORAL | Status: DC | PRN

## 2020-05-15 MED ORDER — BACITRACIN ZINC 500 UNIT/GM EX OINT
1.0000 "application " | TOPICAL_OINTMENT | CUTANEOUS | Status: DC | PRN
  Filled 2020-05-15 (×2): qty 0.9
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
## 2020-05-15 NOTE — ED Notes (Signed)
Pt unable to sign MSE waiver. Pt in respiratory distress

## 2020-05-15 NOTE — Progress Notes (Signed)
Pt arrived by [facility] from [facility]. Pt seen at bedside Reviewed chart.  Pt hemodynamically stable. No issues at this time

## 2020-05-15 NOTE — ED Triage Notes (Signed)
Pt fell yesterday. Pt called out for pain from fall, having difficult breathing with oxygen 70% on room air. Placed on 6 L [place] and oxygen to 95 %.

## 2020-05-15 NOTE — ED Notes (Signed)
Called [facility] with bed assignment and for transport to [facility]. 
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
## 2020-05-15 NOTE — Progress Notes (Signed)
RT NOTE:  ABG collected, labeled and sent to Lab. Lab notified.

## 2020-05-16 DIAGNOSIS — S42491S Other displaced fracture of lower end of right humerus, sequela: Secondary | ICD-10-CM

## 2020-05-16 DIAGNOSIS — Z66 Do not resuscitate: Secondary | ICD-10-CM

## 2020-05-16 DIAGNOSIS — Z515 Encounter for palliative care: Secondary | ICD-10-CM

## 2020-05-16 DIAGNOSIS — S72001A Fracture of unspecified part of neck of right femur, initial encounter for closed fracture: Secondary | ICD-10-CM

## 2020-05-16 DIAGNOSIS — J441 Chronic obstructive pulmonary disease with (acute) exacerbation: Secondary | ICD-10-CM

## 2020-05-16 DIAGNOSIS — Z7189 Other specified counseling: Secondary | ICD-10-CM

## 2020-05-16 LAB — CBC
HCT: 43.4 % (ref 39.0–52.0)
Hemoglobin: 13.5 g/dL (ref 13.0–17.0)
MCH: 29.5 pg (ref 26.0–34.0)
MCHC: 31.1 g/dL (ref 30.0–36.0)
MCV: 94.8 fL (ref 80.0–100.0)
Platelets: 308 10*3/uL (ref 150–400)
RBC: 4.58 MIL/uL (ref 4.22–5.81)
RDW: 14.9 % (ref 11.5–15.5)
WBC: 17.6 10*3/uL — ABNORMAL HIGH (ref 4.0–10.5)
nRBC: 0 % (ref 0.0–0.2)

## 2020-05-16 LAB — BLOOD GAS, ARTERIAL
Acid-Base Excess: 5.6 mmol/L — ABNORMAL HIGH (ref 0.0–2.0)
Bicarbonate: 30.6 mmol/L — ABNORMAL HIGH (ref 20.0–28.0)
Drawn by: 548791
FIO2: 40
O2 Saturation: 97.2 %
Patient temperature: 37
pCO2 arterial: 53.5 mmHg — ABNORMAL HIGH (ref 32.0–48.0)
pH, Arterial: 7.376 (ref 7.350–7.450)
pO2, Arterial: 91.9 mmHg (ref 83.0–108.0)

## 2020-05-16 LAB — HEMOGLOBIN A1C
Hgb A1c MFr Bld: 5.7 % — ABNORMAL HIGH (ref 4.8–5.6)
Mean Plasma Glucose: 116.89 mg/dL

## 2020-05-16 LAB — GLUCOSE, CAPILLARY
Glucose-Capillary: 164 mg/dL — ABNORMAL HIGH (ref 70–99)
Glucose-Capillary: 181 mg/dL — ABNORMAL HIGH (ref 70–99)
Glucose-Capillary: 154 mg/dL — ABNORMAL HIGH (ref 70–99)
Glucose-Capillary: 136 mg/dL — ABNORMAL HIGH (ref 70–99)

## 2020-05-16 MED ORDER — SALINE SPRAY 0.65 % NA SOLN
1.0000 | NASAL | Status: DC | PRN
  Administered 2020-05-17: 1 via NASAL
  Filled 2020-05-16: qty 44

## 2020-05-16 MED ORDER — SODIUM CHLORIDE 0.9 % IV SOLN
500.0000 mg | INTRAVENOUS | Status: DC
  Administered 2020-05-16 – 2020-05-18 (×3): 500 mg via INTRAVENOUS
  Filled 2020-05-16 (×4): qty 500

## 2020-05-16 MED ORDER — SENNOSIDES-DOCUSATE SODIUM 8.6-50 MG PO TABS
2.0000 | ORAL_TABLET | Freq: Every day | ORAL | Status: DC
  Administered 2020-05-16 – 2020-05-20 (×5): 2 via ORAL
  Filled 2020-05-16 (×5): qty 2

## 2020-05-16 MED ORDER — IPRATROPIUM-ALBUTEROL 0.5-2.5 (3) MG/3ML IN SOLN
3.0000 mL | Freq: Four times a day (QID) | RESPIRATORY_TRACT | Status: DC | PRN
  Administered 2020-05-17 – 2020-05-21 (×4): 3 mL via RESPIRATORY_TRACT
  Filled 2020-05-16 (×6): qty 3

## 2020-05-16 MED ORDER — METHYLPREDNISOLONE SODIUM SUCC 125 MG IJ SOLR
60.0000 mg | Freq: Four times a day (QID) | INTRAMUSCULAR | Status: DC
  Administered 2020-05-16 – 2020-05-17 (×4): 60 mg via INTRAVENOUS
  Filled 2020-05-16 (×3): qty 2
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
## 2020-05-16 NOTE — Progress Notes (Signed)
Pt taken off bipap per MD request. Pt placed on 4L nasal cannula with humidification. RN aware of changes. RT will continue to monitor and be available as needed.
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
## 2020-05-16 NOTE — Consult Note (Signed)
Orthopaedic Trauma Service (OTS) Consult   Patient ID: Cameron Villarreal MRN: [id number] DOB/AGE: Oct 31, 1965 55 y.o.  Reason for Consult: R proximal humerus fracture, R intertrochanteric femur fracture  Referring Physician: Fredia Sorrow, MD [facility] ED)  HPI: Cameron Villarreal is an 55 y.o. male with significant past medical history consisting of of end-stage COPD on hospice, GERD, long-term use of opiates, chronic smoker being seen in consultation at the request of Dr. Rogene Houston for right proximal humerus fracture and right intertrochanteric femur fracture.  Patient fell off of his front porch yesterday, landing on his right side.  Had immediate pain in the right shoulder and right hip.  Was taken to [facility] emergency department via EMS for evaluation.  Was found to have right proximal humerus and right intertrochanteric femur fracture.  Orthopedics there was consulted for evaluation and management.  Dr. Aline Brochure was initially going to take patient to surgery or any pain but due to complexity of his airway issues was felt that patient be transferred to [facility].  Patient seen this morning on 2 W.  He is currently resting in bed.  Notes pain in the right shoulder and right hip.  States his pain is 9/10 but visibly does not appear extremely uncomfortable.  Unable to move right shoulder or right hip secondary to pain.  He denies any other injuries from the fall yesterday.  Denies any numbness or tingling to bilateral upper or lower extremities.  Patient has previously underwent ORIF of right supracondylar distal humerus fracture right distal radius fracture by Dr. Doreatha Martin in October 2019.  No residual issues from this.  Currently on chronic pain medications including oxycodone 10 mg 3 times daily.  Smokes about a half a pack cigarettes a day despite being on 3L home oxygen.  Ambulates without an assistive device at baseline but does note he has both a cane and walker at home.  States he spends about 50%  of his day in the bed.  Past Medical History:  Diagnosis Date  . Anxiety   . Anxiety disorder, unspecified   . Back pain   . Chest pain at rest 07/26/2011  . Chronic obstructive pulmonary disease, unspecified ([place])   . COPD (chronic obstructive pulmonary disease) (HCC)    Severe centrilobular emphysema, on home o2  . Cough   . Dyspnea   . GERD (gastroesophageal reflux disease)   . Hypercholesterolemia   . Insomnia   . Long term current use of opiate analgesic 04/12/2018  . Low back pain   . Nicotine dependence, unspecified, uncomplicated   . Other specified disorders of teeth and supporting structures   . Pneumonia   . Pulmonary nodule 07/04/2011  . RECTAL BLEEDING 04/21/2010   Qualifier: Diagnosis of  By: Algernon Huxley NP, Vicente Males    . S/P colonoscopy March 2012   Normal  . S/P endoscopy March 2012   Reflux esophagitis, no ulcerations  . Shortness of breath   . Unilateral inguinal hernia, without obstruction or gangrene, not specified as recurrent   . Unspecified protein-calorie malnutrition ([place])     Past Surgical History:  Procedure Laterality Date  . APPENDECTOMY    . CHOLECYSTECTOMY    . COLONOSCOPY    . HERNIA REPAIR    . KNEE SURGERY Right    laceration wired back together  . OPEN REDUCTION INTERNAL FIXATION (ORIF) DISTAL RADIAL FRACTURE Right 11/17/2017   Procedure: OPEN REDUCTION INTERNAL FIXATION (ORIF) DISTAL RADIAL FRACTURE;  Surgeon: Shona Needles, MD;  Location:  [place] OR;  Service: Orthopedics;  Laterality: Right;  . ORIF HUMERUS FRACTURE Right 11/17/2017   Procedure: OPEN REDUCTION INTERNAL FIXATION (ORIF) DISTAL HUMERUS FRACTURE;  Surgeon: Shona Needles, MD;  Location: [place];  Service: Orthopedics;  Laterality: Right;  . Right inguinal hernia repair      Family History  Problem Relation Age of Onset  . Cirrhosis Father        Deceased, ETOH cirrhosis  . Alcoholism Father   . Depression Brother   . Stomach cancer Other        Aunt    Social History:  reports  that he has been smoking cigarettes. He has a 7.50 pack-year smoking history. He has never used smokeless tobacco. He reports that he does not drink alcohol and does not use drugs.  Allergies:  Allergies  Allergen Reactions  . Codeine Itching and Nausea Only  . Hydrocodone Itching, Swelling and Other (See Comments)    "Makes it difficult to swallow," but no breathing impairment noted Patient state he can take Oxycodone took it for his arm fracture recently.    Medications:  I have reviewed the patient's current medications. Prior to Admission:  Medications Prior to Admission  Medication Sig Dispense Refill Last Dose  . acetaminophen (TYLENOL) 500 MG tablet Take 500-1,000 mg by mouth every 6 (six) hours as needed (for pain or headaches).     Marland Kitchen albuterol (PROVENTIL) (2.5 MG/3ML) 0.083% nebulizer solution Take 3 mLs (2.5 mg total) by nebulization every 4 (four) hours as needed for wheezing or shortness of breath. 75 mL 5 05/15/2020 at Unknown time  . albuterol (VENTOLIN HFA) 108 (90 Base) MCG/ACT inhaler Inhale 2 puffs into the lungs 4 (four) times daily as needed. (Patient taking differently: Inhale 2 puffs into the lungs 4 (four) times daily as needed for wheezing or shortness of breath.) 6.7 g 5   . bacitracin 500 UNIT/GM ointment Apply 1 application topically as needed for wound care.   05/14/2020 at Unknown time  . docusate sodium (COLACE) 100 MG capsule Take 100 mg by mouth 2 (two) times daily.   05/15/2020 at Unknown time  . gabapentin (NEURONTIN) 300 MG capsule Take 1 capsule (300 mg total) by mouth 3 (three) times daily. 90 capsule 2 05/15/2020 at Unknown time  . LORazepam (ATIVAN) 2 MG tablet Take 2 mg by mouth every 6 (six) hours as needed.   05/15/2020 at Unknown time  . morphine (MS CONTIN) 15 MG 12 hr tablet Take 1 tablet (15 mg total) by mouth every 12 (twelve) hours. 60 tablet 0 05/15/2020 at Unknown time  . Morphine Sulfate (MORPHINE CONCENTRATE) 10 MG/0.5ML SOLN concentrated solution Take  0.25 mLs (5 mg total) by mouth every 2 (two) hours as needed for moderate pain, anxiety or shortness of breath (breathlessness). 180 mL 0 05/14/2020 at Unknown time  . predniSONE (DELTASONE) 10 MG tablet Take 40mg  po daily for 3 days then 30mg  daily for 3 days then 20mg  daily for 3 days then 10mg  daily 30 tablet 1 05/15/2020 at Unknown time  . traZODone (DESYREL) 50 MG tablet Take 1 tablet (50 mg total) by mouth at bedtime. 30 tablet 2   . ALPRAZolam (XANAX) 0.5 MG tablet Take 1 tablet (0.5 mg total) by mouth 3 (three) times daily as needed for anxiety. (Patient not taking: No sig reported) 30 tablet 0 Not Taking at Unknown time  . Budeson-Glycopyrrol-Formoterol (BREZTRI AEROSPHERE) 160-9-4.8 MCG/ACT AERO Inhale 2 puffs into the lungs in the morning and at  bedtime. (Patient not taking: Reported on 05/15/2020) 10.7 g 5 Not Taking at Unknown time  . cyclobenzaprine (FLEXERIL) 10 MG tablet Take 1 tablet (10 mg total) by mouth 2 (two) times daily. (Patient not taking: Reported on 05/15/2020) 30 tablet 1 Not Taking at Unknown time  . naloxone [facility]) 2 MG/2ML injection For suspected opioid overdose, spray 1 mL in each nostril.  Repeat after 3 minutes if no or minimal response. (Patient not taking: Reported on 05/15/2020)   Not Taking at Unknown time    ROS: Constitutional: No fever or chills Vision: No changes in vision ENT: No difficulty swallowing CV: No chest pain Pulm: No SOB. Currently on 4L O2 via [place] GI: No nausea or vomiting GU: No urgency or inability to hold urine Skin: No poor wound healing Neurologic: No numbness or tingling Psychiatric: No depression or anxiety Heme: No bruising Allergic: No reaction to medications or food   Exam: Blood pressure 105/78, pulse 99, temperature 98.2 F (36.8 C), temperature source Oral, resp. rate 16, height 5\' 8"  (1.727 m), weight 56.7 kg, SpO2 93 %. General: Chronically ill-appearing male.  Sitting up in bed, no acute distress.  Burns noted over  face Orientation: Alert and oriented Mood and Affect: Patient currently pleasant and cooperative Gait: Not assessed due to known fracture Coordination and balance: Within normal limits  Right upper extremity: Well-healed surgical incision over the posterior elbow.  Significant bruising over the shoulder and to the proximal portion of the humerus.  Thickening of the nails noted.  Tenderness with palpation about the shoulder.  Does not tolerate any shoulder motion currently secondary to pain.  Tolerates gentle passive elbow motion.  Has full wrist motion.  Motor and sensory function is intact.  Neurovascularly intact.  Right lower extremity: No significant bruising or swelling noted throughout the extremity.  Does have tenderness with palpation over the lateral hip, but less tender throughout the thigh.  Patient did not want any hip or knee motion attempted secondary to to pain.  Ankle dorsiflexion/plantarflexion is intact.  Endorses sensation to light touch over the dorsal and plantar aspect of his foot.+ DP pulse   LUE/LLE: Skin without lesions. No tenderness to palpation. Full painless ROM, full strength in each muscle group without evidence of instability.  Motor and sensory function grossly intact.  Neurovascularly at baseline.   Medical Decision Making: Data: Imaging: AP and lateral views of the right humerus show impacted, angulated fracture of the humeral neck.  Previous ORIF of distal humerus fracture appears stable with no acute fracture.  No signs of hardware failure or loosening.  AP and lateral views of the right hip show relatively nondisplaced intertrochanteric femur fracture.  Labs:  Results for orders placed or performed during the hospital encounter of 05/15/20 (from the past 24 hour(s))  Blood gas, arterial (at [facility])     Status: Abnormal   Collection Time: 05/15/20  2:43 PM  Result Value Ref Range   FIO2 100.00    pH, Arterial 7.287 (L) 7.350 - 7.450   pCO2 arterial  78.0 (HH) 32.0 - 48.0 mmHg   pO2, Arterial 66.7 (L) 83.0 - 108.0 mmHg   Bicarbonate 30.3 (H) 20.0 - 28.0 mmol/L   Acid-Base Excess 9.5 (H) 0.0 - 2.0 mmol/L   O2 Saturation 90.4 %   Patient temperature 37.0    Allens test (pass/fail) PASS PASS  Blood gas, arterial     Status: Abnormal   Collection Time: 05/15/20 11:05 PM  Result Value Ref  Range   FIO2 40.00    pH, Arterial 7.343 (L) 7.350 - 7.450   pCO2 arterial 60.8 (H) 32.0 - 48.0 mmHg   pO2, Arterial 83.5 83.0 - 108.0 mmHg   Bicarbonate 31.9 (H) 20.0 - 28.0 mmol/L   Acid-Base Excess 6.5 (H) 0.0 - 2.0 mmol/L   O2 Saturation 95.6 %   Patient temperature 37.5    Collection site RIGHT RADIAL    Drawn by [id number]    Sample type ARTERIAL    Allens test (pass/fail) PASS PASS  Hemoglobin A1c     Status: Abnormal   Collection Time: 05/16/20  2:47 AM  Result Value Ref Range   Hgb A1c MFr Bld 5.7 (H) 4.8 - 5.6 %   Mean Plasma Glucose 116.89 mg/dL  CBC     Status: Abnormal   Collection Time: 05/16/20  2:47 AM  Result Value Ref Range   WBC 17.6 (H) 4.0 - 10.5 K/uL   RBC 4.58 4.22 - 5.81 MIL/uL   Hemoglobin 13.5 13.0 - 17.0 g/dL   HCT 43.4 39.0 - 52.0 %   MCV 94.8 80.0 - 100.0 fL   MCH 29.5 26.0 - 34.0 pg   MCHC 31.1 30.0 - 36.0 g/dL   RDW 14.9 11.5 - 15.5 %   Platelets 308 150 - 400 K/uL   nRBC 0.0 0.0 - 0.2 %  Glucose, capillary     Status: Abnormal   Collection Time: 05/16/20  7:58 AM  Result Value Ref Range   Glucose-Capillary 164 (H) 70 - 99 mg/dL  Blood gas, arterial     Status: Abnormal   Collection Time: 05/16/20  9:52 AM  Result Value Ref Range   FIO2 40.00    pH, Arterial 7.376 7.350 - 7.450   pCO2 arterial 53.5 (H) 32.0 - 48.0 mmHg   pO2, Arterial 91.9 83.0 - 108.0 mmHg   Bicarbonate 30.6 (H) 20.0 - 28.0 mmol/L   Acid-Base Excess 5.6 (H) 0.0 - 2.0 mmol/L   O2 Saturation 97.2 %   Patient temperature 37.0    Collection site RIGHT RADIAL    Drawn by [id number]    Sample type ARTERIAL    Allens test (pass/fail) PASS PASS   Glucose, capillary     Status: Abnormal   Collection Time: 05/16/20 11:38 AM  Result Value Ref Range   Glucose-Capillary 181 (H) 70 - 99 mg/dL    Assessment/Plan: 55 year old male with extensive past medical history, presenting for right proximal humerus fracture and right intertrochanteric femur fracture.  In regards to the right proximal humerus fracture, orthopedics will plan to treat this nonoperatively.  Patient may wear a sling for comfort.  He is okay for gentle passive range of motion of the shoulder as she can tolerate.  Okay for unrestricted elbow and wrist motion.  In regards to the right intertrochanteric femur fracture, I have discussed the significance of this injury with the patient.  Due to the fracture being relatively nondisplaced, he could possibly get by with treating this nonoperatively but I feel that without surgery he will have significant pain.  I also discussed that nonoperative management would include remaining nonweightbearing on the right lower extremity for several weeks which would significantly limit his mobility particularly in light of the proximal humerus fracture on ipsilateral side.  Patient states that he would like to avoid surgery of the right hip if at all possible.  Notes that he already spends much of his day in the bed would be fine remaining nonweightbearing on  the right lower extremity for several weeks to allow the fracture to heal.  States if there is anyway the fracture could be treated nonoperatively he would like to pursue this unless the orthopedic surgeon 100% recommends surgery, then he will follow the surgeon's recommendations and proceed with operative management.  Patient was initially on the surgery schedule for Sunday, 05/17/2020.  We will plan to cancel surgery for now.  We will see how patient does with pain management today and tomorrow and re-evaluate him in the morning to again discuss surgical versus nonsurgical management of his right  intertrochanteric femur fracture.  I will keep patient n.p.o. after midnight tonight in the case that surgery is scheduled for tomorrow.  Patient okay to mobilize with therapy as he can tolerate.   Kathlyn Leachman A. [facility] [phone] (office) [email or web address]

## 2020-05-17 ENCOUNTER — Inpatient Hospital Stay (HOSPITAL_COMMUNITY)

## 2020-05-17 ENCOUNTER — Encounter (HOSPITAL_COMMUNITY): Payer: Self-pay | Admitting: Family Medicine

## 2020-05-17 ENCOUNTER — Inpatient Hospital Stay (HOSPITAL_COMMUNITY): Admitting: Anesthesiology

## 2020-05-17 ENCOUNTER — Encounter (HOSPITAL_COMMUNITY)
Admission: EM | Disposition: A | Discharge status: Hospice | Payer: Self-pay | Source: Home / Self Care | Attending: Internal Medicine

## 2020-05-17 ENCOUNTER — Other Ambulatory Visit: Payer: Self-pay

## 2020-05-17 HISTORY — PX: INTRAMEDULLARY (IM) NAIL INTERTROCHANTERIC: SHX5875

## 2020-05-17 LAB — BASIC METABOLIC PANEL
Anion gap: 4 — ABNORMAL LOW (ref 5–15)
BUN: 9 mg/dL (ref 6–20)
CO2: 31 mmol/L (ref 22–32)
Calcium: 8.3 mg/dL — ABNORMAL LOW (ref 8.9–10.3)
Chloride: 100 mmol/L (ref 98–111)
Creatinine, Ser: 0.46 mg/dL — ABNORMAL LOW (ref 0.61–1.24)
GFR, Estimated: >60 mL/min (ref >60)
Glucose, Bld: 137 mg/dL — ABNORMAL HIGH (ref 70–99)
Potassium: 4 mmol/L (ref 3.5–5.1)
Sodium: 135 mmol/L (ref 135–145)

## 2020-05-17 LAB — GLUCOSE, CAPILLARY
Glucose-Capillary: 143 mg/dL — ABNORMAL HIGH (ref 70–99)
Glucose-Capillary: 135 mg/dL — ABNORMAL HIGH (ref 70–99)
Glucose-Capillary: 128 mg/dL — ABNORMAL HIGH (ref 70–99)
Glucose-Capillary: 125 mg/dL — ABNORMAL HIGH (ref 70–99)

## 2020-05-17 LAB — CBC WITH DIFFERENTIAL/PLATELET
Abs Immature Granulocytes: 0.07 10*3/uL (ref 0.00–0.07)
Basophils Absolute: 0 10*3/uL (ref 0.0–0.1)
Basophils Relative: 0 %
Eosinophils Absolute: 0 10*3/uL (ref 0.0–0.5)
Eosinophils Relative: 0 %
HCT: 36 % — ABNORMAL LOW (ref 39.0–52.0)
Hemoglobin: 11.6 g/dL — ABNORMAL LOW (ref 13.0–17.0)
Immature Granulocytes: 0 %
Lymphocytes Relative: 3 %
Lymphs Abs: 0.5 10*3/uL — ABNORMAL LOW (ref 0.7–4.0)
MCH: 29.8 pg (ref 26.0–34.0)
MCHC: 32.2 g/dL (ref 30.0–36.0)
MCV: 92.5 fL (ref 80.0–100.0)
Monocytes Absolute: 0.9 10*3/uL (ref 0.1–1.0)
Monocytes Relative: 5 %
Neutro Abs: 15.1 10*3/uL — ABNORMAL HIGH (ref 1.7–7.7)
Neutrophils Relative %: 92 %
Platelets: 266 10*3/uL (ref 150–400)
RBC: 3.89 MIL/uL — ABNORMAL LOW (ref 4.22–5.81)
RDW: 14.7 % (ref 11.5–15.5)
WBC: 16.5 10*3/uL — ABNORMAL HIGH (ref 4.0–10.5)
nRBC: 0 % (ref 0.0–0.2)

## 2020-05-17 LAB — BLOOD GAS, ARTERIAL
Acid-Base Excess: 6.7 mmol/L — ABNORMAL HIGH (ref 0.0–2.0)
Bicarbonate: 31.6 mmol/L — ABNORMAL HIGH (ref 20.0–28.0)
Drawn by: 56037
FIO2: 36
O2 Saturation: 97.3 %
Patient temperature: 36.9
pCO2 arterial: 54.1 mmHg — ABNORMAL HIGH (ref 32.0–48.0)
pH, Arterial: 7.385 (ref 7.350–7.450)
pO2, Arterial: 93.3 mmHg (ref 83.0–108.0)

## 2020-05-17 LAB — MAGNESIUM: Magnesium: 1.9 mg/dL (ref 1.7–2.4)

## 2020-05-17 LAB — SURGICAL PCR SCREEN
MRSA, PCR: NEGATIVE
Staphylococcus aureus: NEGATIVE

## 2020-05-17 SURGERY — FIXATION, FRACTURE, INTERTROCHANTERIC, WITH INTRAMEDULLARY ROD
Anesthesia: Monitor Anesthesia Care | Site: Hip | Laterality: Right

## 2020-05-17 MED ORDER — VANCOMYCIN HCL 1000 MG IV SOLR
INTRAVENOUS | Status: DC | PRN
  Administered 2020-05-17: 1000 mg

## 2020-05-17 MED ORDER — ACETAMINOPHEN 500 MG PO TABS
1000.0000 mg | ORAL_TABLET | Freq: Four times a day (QID) | ORAL | Status: DC
  Administered 2020-05-17 – 2020-05-22 (×12): 1000 mg via ORAL
  Filled 2020-05-17 (×13): qty 2

## 2020-05-17 MED ORDER — PHENYLEPHRINE HCL-NACL 10-0.9 MG/250ML-% IV SOLN
INTRAVENOUS | Status: DC | PRN
  Administered 2020-05-17: 50 ug/min via INTRAVENOUS

## 2020-05-17 MED ORDER — LACTATED RINGERS IV SOLN: INTRAVENOUS | Status: DC

## 2020-05-17 MED ORDER — TRAZODONE HCL 50 MG PO TABS
50.0000 mg | ORAL_TABLET | Freq: Every evening | ORAL | Status: DC | PRN
  Administered 2020-05-17 – 2020-05-21 (×4): 50 mg via ORAL
  Filled 2020-05-17 (×4): qty 1

## 2020-05-17 MED ORDER — DOCUSATE SODIUM 100 MG PO CAPS
100.0000 mg | ORAL_CAPSULE | Freq: Two times a day (BID) | ORAL | Status: DC
  Administered 2020-05-17 – 2020-05-18 (×2): 100 mg via ORAL
  Filled 2020-05-17 (×2): qty 1

## 2020-05-17 MED ORDER — MIDAZOLAM HCL 5 MG/5ML IJ SOLN
INTRAMUSCULAR | Status: DC | PRN
  Administered 2020-05-17 (×2): 1 mg via INTRAVENOUS

## 2020-05-17 MED ORDER — METOCLOPRAMIDE HCL 5 MG PO TABS: 5.0000 mg | ORAL_TABLET | Freq: Three times a day (TID) | ORAL | Status: DC | PRN

## 2020-05-17 MED ORDER — METHOCARBAMOL 1000 MG/10ML IJ SOLN
500.0000 mg | Freq: Four times a day (QID) | INTRAVENOUS | Status: DC | PRN
  Filled 2020-05-17: qty 5

## 2020-05-17 MED ORDER — ONDANSETRON HCL 4 MG/2ML IJ SOLN
INTRAMUSCULAR | Status: DC | PRN
  Administered 2020-05-17: 4 mg via INTRAVENOUS

## 2020-05-17 MED ORDER — ONDANSETRON HCL 4 MG/2ML IJ SOLN: 4.0000 mg | Freq: Four times a day (QID) | INTRAMUSCULAR | Status: DC | PRN

## 2020-05-17 MED ORDER — CHLORHEXIDINE GLUCONATE 0.12 % MT SOLN
OROMUCOSAL | Status: AC
  Administered 2020-05-17: 15 mL via OROMUCOSAL
  Filled 2020-05-17: qty 15

## 2020-05-17 MED ORDER — PROPOFOL 500 MG/50ML IV EMUL
INTRAVENOUS | Status: DC | PRN
  Administered 2020-05-17: 75 ug/kg/min via INTRAVENOUS

## 2020-05-17 MED ORDER — 0.9 % SODIUM CHLORIDE (POUR BTL) OPTIME
TOPICAL | Status: DC | PRN
  Administered 2020-05-17: 1000 mL

## 2020-05-17 MED ORDER — METHOCARBAMOL 500 MG PO TABS
500.0000 mg | ORAL_TABLET | Freq: Four times a day (QID) | ORAL | Status: DC | PRN
  Administered 2020-05-17: 500 mg via ORAL
  Filled 2020-05-17: qty 1

## 2020-05-17 MED ORDER — CEFAZOLIN SODIUM-DEXTROSE 2-4 GM/100ML-% IV SOLN
2.0000 g | Freq: Three times a day (TID) | INTRAVENOUS | Status: AC
  Administered 2020-05-17 – 2020-05-18 (×3): 2 g via INTRAVENOUS
  Filled 2020-05-17 (×3): qty 100

## 2020-05-17 MED ORDER — PROPOFOL 10 MG/ML IV BOLUS
INTRAVENOUS | Status: AC
  Filled 2020-05-17: qty 20

## 2020-05-17 MED ORDER — VANCOMYCIN HCL 1000 MG IV SOLR
INTRAVENOUS | Status: AC
  Filled 2020-05-17: qty 1000

## 2020-05-17 MED ORDER — GLYCOPYRROLATE 0.2 MG/ML IJ SOLN
INTRAMUSCULAR | Status: DC | PRN
  Administered 2020-05-17: .2 mg via INTRAVENOUS

## 2020-05-17 MED ORDER — TRANEXAMIC ACID-NACL 1000-0.7 MG/100ML-% IV SOLN
1000.0000 mg | Freq: Once | INTRAVENOUS | Status: AC
  Administered 2020-05-17: 1000 mg via INTRAVENOUS
  Filled 2020-05-17 (×2): qty 100

## 2020-05-17 MED ORDER — CEFAZOLIN SODIUM-DEXTROSE 2-4 GM/100ML-% IV SOLN
2.0000 g | INTRAVENOUS | Status: AC
  Administered 2020-05-17: 2 g via INTRAVENOUS
  Filled 2020-05-17: qty 100

## 2020-05-17 MED ORDER — OXYCODONE HCL 5 MG PO TABS
5.0000 mg | ORAL_TABLET | ORAL | Status: DC | PRN
  Administered 2020-05-17: 5 mg via ORAL
  Administered 2020-05-18 – 2020-05-22 (×12): 10 mg via ORAL
  Filled 2020-05-17: qty 1
  Filled 2020-05-17 (×13): qty 2

## 2020-05-17 MED ORDER — BUPIVACAINE HCL (PF) 0.5 % IJ SOLN
INTRAMUSCULAR | Status: AC
  Filled 2020-05-17: qty 30

## 2020-05-17 MED ORDER — PROPOFOL 10 MG/ML IV BOLUS
INTRAVENOUS | Status: DC | PRN
  Administered 2020-05-17: 20 mg via INTRAVENOUS
  Administered 2020-05-17: 30 mg via INTRAVENOUS
  Administered 2020-05-17: 20 mg via INTRAVENOUS

## 2020-05-17 MED ORDER — MIDAZOLAM HCL 2 MG/2ML IJ SOLN
INTRAMUSCULAR | Status: AC
  Filled 2020-05-17: qty 2

## 2020-05-17 MED ORDER — ENOXAPARIN SODIUM 40 MG/0.4ML ~~LOC~~ SOLN
40.0000 mg | SUBCUTANEOUS | Status: DC
  Administered 2020-05-18 – 2020-05-22 (×5): 40 mg via SUBCUTANEOUS
  Filled 2020-05-17 (×5): qty 0.4

## 2020-05-17 MED ORDER — LORAZEPAM 2 MG/ML IJ SOLN
1.0000 mg | Freq: Once | INTRAMUSCULAR | Status: AC
  Administered 2020-05-17: 1 mg via INTRAVENOUS
  Filled 2020-05-17: qty 1

## 2020-05-17 MED ORDER — POLYETHYLENE GLYCOL 3350 17 G PO PACK: 17.0000 g | PACK | Freq: Every day | ORAL | Status: DC | PRN

## 2020-05-17 MED ORDER — HYDROMORPHONE HCL 1 MG/ML IJ SOLN
0.5000 mg | INTRAMUSCULAR | Status: DC | PRN
  Administered 2020-05-18 – 2020-05-22 (×11): 1 mg via INTRAVENOUS
  Filled 2020-05-17 (×11): qty 1

## 2020-05-17 MED ORDER — METOCLOPRAMIDE HCL 5 MG/ML IJ SOLN: 5.0000 mg | Freq: Three times a day (TID) | INTRAMUSCULAR | Status: DC | PRN

## 2020-05-17 MED ORDER — METHYLPREDNISOLONE SODIUM SUCC 125 MG IJ SOLR
60.0000 mg | Freq: Three times a day (TID) | INTRAMUSCULAR | Status: DC
  Administered 2020-05-17 – 2020-05-18 (×2): 60 mg via INTRAVENOUS
  Filled 2020-05-17 (×2): qty 2

## 2020-05-17 MED ORDER — FENTANYL CITRATE (PF) 250 MCG/5ML IJ SOLN
INTRAMUSCULAR | Status: AC
  Filled 2020-05-17: qty 5

## 2020-05-17 MED ORDER — BACITRACIN ZINC 500 UNIT/GM EX OINT
TOPICAL_OINTMENT | Freq: Two times a day (BID) | CUTANEOUS | Status: DC
  Administered 2020-05-20: 1 via TOPICAL
  Filled 2020-05-17: qty 28.4

## 2020-05-17 MED ORDER — ONDANSETRON HCL 4 MG PO TABS: 4.0000 mg | ORAL_TABLET | Freq: Four times a day (QID) | ORAL | Status: DC | PRN

## 2020-05-17 MED ORDER — TRANEXAMIC ACID-NACL 1000-0.7 MG/100ML-% IV SOLN
1000.0000 mg | INTRAVENOUS | Status: AC
  Administered 2020-05-17: 1000 mg via INTRAVENOUS
  Filled 2020-05-17 (×2): qty 100

## 2020-05-17 MED ORDER — CHLORHEXIDINE GLUCONATE 0.12 % MT SOLN: 15.0000 mL | Freq: Once | OROMUCOSAL | Status: AC

## 2020-05-17 SURGICAL SUPPLY — 48 items
BIT DRILL INTERTAN LAG SCREW (BIT) ×1 IMPLANT
BIT DRILL LONG 4.0 (BIT) IMPLANT
BRUSH SCRUB EZ PLAIN DRY (MISCELLANEOUS) ×4 IMPLANT
CHLORAPREP W/TINT 26 (MISCELLANEOUS) ×2 IMPLANT
COVER PERINEAL POST (MISCELLANEOUS) ×2 IMPLANT
COVER SURGICAL LIGHT HANDLE (MISCELLANEOUS) ×2 IMPLANT
DERMABOND ADVANCED (GAUZE/BANDAGES/DRESSINGS) ×1
DERMABOND ADVANCED .7 DNX12 (GAUZE/BANDAGES/DRESSINGS) ×1 IMPLANT
DRAPE C-ARM 35X43 STRL (DRAPES) ×2 IMPLANT
DRAPE IMP U-DRAPE 54X76 (DRAPES) ×4 IMPLANT
DRAPE INCISE IOBAN 66X45 STRL (DRAPES) ×2 IMPLANT
DRAPE STERI IOBAN 125X83 (DRAPES) ×2 IMPLANT
DRAPE SURG 17X23 STRL (DRAPES) ×4 IMPLANT
DRAPE U-SHAPE 47X51 STRL (DRAPES) ×2 IMPLANT
DRESSING MEPILEX FLEX 4X4 (GAUZE/BANDAGES/DRESSINGS) IMPLANT
DRILL BIT LONG 4.0 (BIT) ×2
DRSG MEPILEX BORDER 4X4 (GAUZE/BANDAGES/DRESSINGS) ×1 IMPLANT
DRSG MEPILEX BORDER 4X8 (GAUZE/BANDAGES/DRESSINGS) ×2 IMPLANT
DRSG MEPILEX FLEX 4X4 (GAUZE/BANDAGES/DRESSINGS) ×2
DRSG MEPILEX SACRM 8.7X9.8 (GAUZE/BANDAGES/DRESSINGS) ×1 IMPLANT
ELECT REM PT RETURN 9FT ADLT (ELECTROSURGICAL) ×2
ELECTRODE REM PT RTRN 9FT ADLT (ELECTROSURGICAL) ×1 IMPLANT
GLOVE BIO SURGEON STRL SZ 6.5 (GLOVE) ×6 IMPLANT
GLOVE BIO SURGEON STRL SZ7.5 (GLOVE) ×8 IMPLANT
GLOVE BIOGEL PI IND STRL 7.5 (GLOVE) ×1 IMPLANT
GLOVE BIOGEL PI INDICATOR 7.5 (GLOVE) ×2
GLOVE SURG UNDER POLY LF SZ6.5 (GLOVE) ×2 IMPLANT
GOWN STRL REUS W/ TWL LRG LVL3 (GOWN DISPOSABLE) ×1 IMPLANT
GOWN STRL REUS W/TWL LRG LVL3 (GOWN DISPOSABLE) ×8
GUIDE PIN 3.2X343 (PIN) ×2
GUIDE PIN 3.2X343MM (PIN) ×4
KIT BASIN OR (CUSTOM PROCEDURE TRAY) ×2 IMPLANT
KIT TURNOVER KIT B (KITS) ×2 IMPLANT
MANIFOLD NEPTUNE II (INSTRUMENTS) ×1 IMPLANT
NAIL INTERTAN 10X18 130D 10S (Nail) ×1 IMPLANT
NS IRRIG 1000ML POUR BTL (IV SOLUTION) ×2 IMPLANT
PACK GENERAL/GYN (CUSTOM PROCEDURE TRAY) ×2 IMPLANT
PAD ARMBOARD 7.5X6 YLW CONV (MISCELLANEOUS) ×4 IMPLANT
PIN GUIDE 3.2X343MM (PIN) IMPLANT
SCREW LAG COMPR KIT 95/90 (Screw) ×1 IMPLANT
SCREW TRIGEN LOW PROF 5.0X35 (Screw) ×1 IMPLANT
SLING ARM FOAM STRAP LRG (SOFTGOODS) ×1 IMPLANT
SUT MNCRL AB 3-0 PS2 18 (SUTURE) ×2 IMPLANT
SUT VIC AB 0 CT1 27 (SUTURE)
SUT VIC AB 0 CT1 27XBRD ANBCTR (SUTURE) IMPLANT
SUT VIC AB 2-0 CT1 27 (SUTURE) ×2
SUT VIC AB 2-0 CT1 TAPERPNT 27 (SUTURE) ×2 IMPLANT
TOWEL GREEN STERILE (TOWEL DISPOSABLE) ×4 IMPLANT
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
## 2020-05-17 NOTE — H&P (View-Only) (Signed)
Ortho Progress Note  After full discussion with the patient he wishes to pursue surgical management of his hip.  Risks and benefits were discussed with the patient.  Risks include but not limited to bleeding, infection, malunion, nonunion, hardware failure, hardware irritation, nerve or blood vessel injury, DVT, even the possibility anesthetic complications.  The patient agreed to proceed with surgery and consent was obtained.  Shona Needles, MD [facility] [phone] (office) [email or web address]

## 2020-05-17 NOTE — Progress Notes (Signed)
Ortho Progress Note  After full discussion with the patient he wishes to pursue surgical management of his hip.  Risks and benefits were discussed with the patient.  Risks include but not limited to bleeding, infection, malunion, nonunion, hardware failure, hardware irritation, nerve or blood vessel injury, DVT, even the possibility anesthetic complications.  The patient agreed to proceed with surgery and consent was obtained.  Shona Needles, MD [facility] [phone] (office) [email or web address]
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
## 2020-05-17 NOTE — OR Nursing (Signed)
Large Procare sling placed on right arm by PA.

## 2020-05-17 NOTE — Interval H&P Note (Signed)
History and Physical Interval Note:  05/17/2020 2:22 PM  Cameron Villarreal  has presented today for surgery, with the diagnosis of Right intertrochanteic femur fracture.  The various methods of treatment have been discussed with the patient and family. After consideration of risks, benefits and other options for treatment, the patient has consented to  Procedure(s): INTRAMEDULLARY (IM) NAIL INTERTROCHANTRIC (Right) as a surgical intervention.  The patient's history has been reviewed, patient examined, no change in status, stable for surgery.  I have reviewed the patient's chart and labs.  Questions were answered to the patient's satisfaction.     Lennette Bihari P Rashawn Rayman
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
## 2020-05-17 NOTE — Anesthesia Preprocedure Evaluation (Signed)
Anesthesia Evaluation  Patient identified by MRN, date of birth, ID band Patient awake    Reviewed: Allergy & Precautions, NPO status , Patient's Chart, lab work & pertinent test results  History of Anesthesia Complications Negative for: history of anesthetic complications  Airway Mallampati: II  TM Distance: >3 FB Neck ROM: Full    Dental  (+) Edentulous Upper, Edentulous Lower   Pulmonary shortness of breath, COPD,  COPD inhaler and oxygen dependent, Current Smoker and Patient abstained from smoking.,  Covid-19 Nucleic Acid Test Results Lab Results      Component                Value               Date                      [place]              NEGATIVE            05/15/2020                [place]              NEGATIVE            04/08/2020                [place]              NEGATIVE            09/18/2019                [place]              Not Detected        12/17/2018               + decreased breath sounds      Cardiovascular negative cardio ROS   Rhythm:Regular  - Left ventricle: The cavity size was normal. Wall thickness was  normal. Systolic function was normal. The estimated ejection  fraction was in the range of 50% to 55%. Wall motion was normal;  there were no regional wall motion abnormalities. Doppler  parameters are consistent with abnormal left ventricular  relaxation (grade 1 diastolic dysfunction).  - Ventricular septum: The contour showed diastolic flattening and  systolic flattening.  - Right ventricle: The cavity size was mildly dilated. Systolic  function was moderately reduced.  - Right atrium: The atrium was mildly dilated.    Neuro/Psych PSYCHIATRIC DISORDERS Anxiety negative neurological ROS     GI/Hepatic GERD  ,Lab Results      Component                Value               Date                      ALT                      24                  05/15/2020                 AST                      19  05/15/2020                ALKPHOS                  72                  05/15/2020                BILITOT                  0.7                 05/15/2020              Endo/Other  Lab Results      Component                Value               Date                      HGBA1C                   5.7 (H)             05/16/2020             Renal/GU Lab Results      Component                Value               Date                      CREATININE               0.46 (L)            05/17/2020           Lab Results      Component                Value               Date                      K                        4.0                 05/17/2020                Musculoskeletal Right intertrochanteic femur fracture   Abdominal   Peds  Hematology  (+) Blood dyscrasia, anemia , Lab Results      Component                Value               Date                      WBC                      16.5 (H)            05/17/2020                HGB                      11.6 (L)  05/17/2020                HCT                      36.0 (L)            05/17/2020                MCV                      92.5                05/17/2020                PLT                      266                 05/17/2020              Anesthesia Other Findings Denies blood thinners  Reproductive/Obstetrics                             Anesthesia Physical Anesthesia Plan  ASA: IV  Anesthesia Plan: MAC and Spinal   Post-op Pain Management:    Induction: Intravenous  PONV Risk Score and Plan: 1 and Propofol infusion and Treatment may vary due to age or medical condition  Airway Management Planned: Nasal Cannula and Mask  Additional Equipment: None  Intra-op Plan:   Post-operative Plan:   Informed Consent: I have reviewed the patients History and Physical, chart, labs and discussed the procedure including the risks, benefits and  alternatives for the proposed anesthesia with the patient or authorized representative who has indicated his/her understanding and acceptance.     Dental advisory given  Plan Discussed with: CRNA and Surgeon  Anesthesia Plan Comments:         Anesthesia Quick Evaluation

## 2020-05-17 NOTE — Op Note (Signed)
Orthopaedic Surgery Operative Note (CSN: [id number] ) Date of Surgery: 05/17/2020  Admit Date: 05/15/2020   Diagnoses: Pre-Op Diagnoses: Right intertrochanteric femur fracture   Post-Op Diagnosis: Same  Procedures: CPT 27245-Cephalomedullary nailing of right intertrochanteric femur fracture  Surgeons : Primary: Shona Needles, MD  Assistant: Patrecia Pace, PA-C  Location: OR 6   Anesthesia:Epidural  Antibiotics: Ancef 2g preop with 1 gm vancomycin powder   Tourniquet time:None used  Estimated Blood JJOA:41 mL  Complications:None  Specimens:None   Implants: Implant Name Type Inv. Item Serial No. Manufacturer Lot No. LRB No. Used Action  NAIL INTERTAN 10X18 130D 10S - [id number] Nail NAIL INTERTAN 10X18 130D 10S  [employer] [id number] Right 1 Implanted  SCREW LAG COMBO 95.90 - [id number] Screw SCREW LAG COMBO 95.90  [employer] [id number] Right 1 Implanted  SCREW TRIGEN LOW PROF 5.0X35 - [id number] Screw SCREW TRIGEN LOW PROF 5.0X35  [employer] [id number] Right 1 Implanted     Indications for Surgery: 55 year old male with a history of COPD who sustained a ground-level fall with a right proximal humerus fracture and a right intertrochanteric femur fracture.  Due to the unstable nature of his right hip injury I recommend proceeding with cephalomedullary nailing.  Risks and benefits were discussed with the patient.  Risks include but not limited to bleeding, infection, malunion, nonunion, hardware failure, hardware irritation, nerve or blood vessel injury, DVT, even the possibility anesthetic complications.  The patient agreed to proceed with surgery and consent was obtained.  I felt that his right shoulder could be treated nonoperatively.  Operative Findings: Cephalomedullary nailing of right intertrochanteric femur fracture with Tam[employer] InterTAN 10 x 180 mm nail with a 95 mm lag screw and a 90 mm compression  screw.  Procedure: The patient was identified in the preoperative holding area. Consent was confirmed with the patient and their family and all questions were answered. The operative extremity was marked after confirmation with the patient. he was then brought back to the operating room by our anesthesia colleagues.  He was placed under spinal anesthetic.  He was carefully transferred over to the [facility] table.  All bony prominences were well-padded.  Fluoroscopic imaging was obtained to obtain adequate reduction of the intertrochanteric femur fracture.  The right lower extremity was then prepped and draped in usual sterile fashion.  A timeout was performed to verify the patient, the procedure, and the extremity.  Preoperative antibiotics were dosed.  80 incision proximal to the greater trochanter was made and carried down through skin and subcutaneous tissue.  The gluteal fascia was split in line with the incision.  I then directed a threaded guidewire at the tip of the greater trochanter into the proximal metaphysis.  I confirmed position with fluoroscopic imaging and then used an entry reamer to enter the medullary canal.  I then placed a 10 x 180 mm nail down the center canal and seated until it was appropriate aligned on the AP fluoroscopic imaging.  Lateral incision was made and a threaded guidewire was used to target into the head/neck segment through the targeting arm.  I confirmed adequate tip apex distance using AP and lateral fluoroscopic imaging.  I then measured and chose to use a 95 mm lag screw.  I drilled the path for the compression screw placed an antirotation bar then drilled the path for the lag screw.  I placed the lag screw and then compressed approximately 5 mm when I placed the compression  screw.  I then used the targeting arm to place a distal interlocking screw from lateral to medial.  Final fluoroscopic imaging was obtained.  The incisions were copiously irrigated.  A gram of  vancomycin powder was placed into the incision.  Layered closure of 2-0 Vicryl and 3-0 Monocryl with Dermabond was used to close the skin.  Sterile dressings were placed.  The patient was awoken from anesthesia and taken to the PACU in stable condition.   Post Op Plan/Instructions: Patient will be weightbearing as tolerated to the right lower extremity.  Nonweightbearing to the right upper extremity.  Sling for comfort.  We will mobilize him with physical and Occupational Therapy.  We will plan for Lovenox for DVT prophylax.  I was present and performed the entire surgery.  Patrecia Pace, PA-C did assist me throughout the case. An assistant was necessary given the difficulty in approach, maintenance of reduction and ability to instrument the fracture.   Katha Hamming, MD [facility]

## 2020-05-17 NOTE — Transfer of Care (Signed)
Immediate Anesthesia Transfer of Care Note  Patient: Cameron Villarreal  Procedure(s) Performed: INTRAMEDULLARY (IM) NAIL INTERTROCHANTRIC (Right Hip)  Patient Location: PACU  Anesthesia Type:MAC and Spinal  Level of Consciousness: awake and alert   Airway & Oxygen Therapy: Patient Spontanous Breathing and Patient connected to face mask oxygen  Post-op Assessment: Report given to RN and Post -op Vital signs reviewed and stable  Post vital signs: Reviewed and stable  Last Vitals:  Vitals Value Taken Time  BP 98/73 05/17/20 1558  Temp    Pulse 65 05/17/20 1603  Resp 14 05/17/20 1603  SpO2 100 % 05/17/20 1603  Vitals shown include unvalidated device data.  Last Pain:  Vitals:   05/17/20 1258  TempSrc:   PainSc: 9       Patients Stated Pain Goal: 3 (38/33/38 3291)  Complications: No complications documented.

## 2020-05-18 LAB — GLUCOSE, CAPILLARY
Glucose-Capillary: 220 mg/dL — ABNORMAL HIGH (ref 70–99)
Glucose-Capillary: 180 mg/dL — ABNORMAL HIGH (ref 70–99)
Glucose-Capillary: 134 mg/dL — ABNORMAL HIGH (ref 70–99)
Glucose-Capillary: 158 mg/dL — ABNORMAL HIGH (ref 70–99)
Glucose-Capillary: 91 mg/dL (ref 70–99)

## 2020-05-18 LAB — CBC WITH DIFFERENTIAL/PLATELET
Abs Immature Granulocytes: 0.13 10*3/uL — ABNORMAL HIGH (ref 0.00–0.07)
Basophils Absolute: 0 10*3/uL (ref 0.0–0.1)
Basophils Relative: 0 %
Eosinophils Absolute: 0 10*3/uL (ref 0.0–0.5)
Eosinophils Relative: 0 %
HCT: 34 % — ABNORMAL LOW (ref 39.0–52.0)
Hemoglobin: 11 g/dL — ABNORMAL LOW (ref 13.0–17.0)
Immature Granulocytes: 1 %
Lymphocytes Relative: 1 %
Lymphs Abs: 0.3 10*3/uL — ABNORMAL LOW (ref 0.7–4.0)
MCH: 30.2 pg (ref 26.0–34.0)
MCHC: 32.4 g/dL (ref 30.0–36.0)
MCV: 93.4 fL (ref 80.0–100.0)
Monocytes Absolute: 1.5 10*3/uL — ABNORMAL HIGH (ref 0.1–1.0)
Monocytes Relative: 7 %
Neutro Abs: 20.2 10*3/uL — ABNORMAL HIGH (ref 1.7–7.7)
Neutrophils Relative %: 91 %
Platelets: 262 10*3/uL (ref 150–400)
RBC: 3.64 MIL/uL — ABNORMAL LOW (ref 4.22–5.81)
RDW: 14.9 % (ref 11.5–15.5)
WBC: 22.1 10*3/uL — ABNORMAL HIGH (ref 4.0–10.5)
nRBC: 0 % (ref 0.0–0.2)

## 2020-05-18 LAB — BASIC METABOLIC PANEL
Anion gap: 3 — ABNORMAL LOW (ref 5–15)
BUN: 15 mg/dL (ref 6–20)
CO2: 30 mmol/L (ref 22–32)
Calcium: 8.4 mg/dL — ABNORMAL LOW (ref 8.9–10.3)
Chloride: 102 mmol/L (ref 98–111)
Creatinine, Ser: 0.49 mg/dL — ABNORMAL LOW (ref 0.61–1.24)
GFR, Estimated: >60 mL/min (ref >60)
Glucose, Bld: 166 mg/dL — ABNORMAL HIGH (ref 70–99)
Potassium: 3.9 mmol/L (ref 3.5–5.1)
Sodium: 135 mmol/L (ref 135–145)

## 2020-05-18 LAB — MAGNESIUM: Magnesium: 1.9 mg/dL (ref 1.7–2.4)

## 2020-05-18 LAB — VITAMIN D 25 HYDROXY (VIT D DEFICIENCY, FRACTURES): Vit D, 25-Hydroxy: 17.95 ng/mL — ABNORMAL LOW (ref 30–100)

## 2020-05-18 MED ORDER — METHYLPREDNISOLONE SODIUM SUCC 40 MG IJ SOLR
40.0000 mg | Freq: Three times a day (TID) | INTRAMUSCULAR | Status: DC
  Administered 2020-05-18 – 2020-05-19 (×3): 40 mg via INTRAVENOUS
  Filled 2020-05-18 (×4): qty 1

## 2020-05-18 MED ORDER — BISACODYL 10 MG RE SUPP: 10.0000 mg | Freq: Every day | RECTAL | Status: DC | PRN

## 2020-05-18 MED ORDER — VITAMIN D 25 MCG (1000 UNIT) PO TABS
1000.0000 [IU] | ORAL_TABLET | Freq: Two times a day (BID) | ORAL | Status: DC
  Administered 2020-05-18 – 2020-05-22 (×8): 1000 [IU] via ORAL
  Filled 2020-05-18 (×7): qty 1
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
## 2020-05-18 NOTE — Progress Notes (Signed)
RT entered to ask pt about wearing bipap and pt stated he wears it at night and charting reflects this.  RT placed masked and was unable to get a good seal.  RT was going to replace mask with smaller one but pt started ripping bipap off face stating he doesn't wear one now.  Pt seems upset that this RT placed it.  At this time pt is on  with no distress.  RT will continue to monitor.
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
## 2020-05-19 LAB — CBC WITH DIFFERENTIAL/PLATELET
Abs Immature Granulocytes: 0.09 10*3/uL — ABNORMAL HIGH (ref 0.00–0.07)
Basophils Absolute: 0 10*3/uL (ref 0.0–0.1)
Basophils Relative: 0 %
Eosinophils Absolute: 0 10*3/uL (ref 0.0–0.5)
Eosinophils Relative: 0 %
HCT: 34.8 % — ABNORMAL LOW (ref 39.0–52.0)
Hemoglobin: 11 g/dL — ABNORMAL LOW (ref 13.0–17.0)
Immature Granulocytes: 1 %
Lymphocytes Relative: 2 %
Lymphs Abs: 0.3 10*3/uL — ABNORMAL LOW (ref 0.7–4.0)
MCH: 29.1 pg (ref 26.0–34.0)
MCHC: 31.6 g/dL (ref 30.0–36.0)
MCV: 92.1 fL (ref 80.0–100.0)
Monocytes Absolute: 0.9 10*3/uL (ref 0.1–1.0)
Monocytes Relative: 5 %
Neutro Abs: 15.5 10*3/uL — ABNORMAL HIGH (ref 1.7–7.7)
Neutrophils Relative %: 92 %
Platelets: 174 10*3/uL (ref 150–400)
RBC: 3.78 MIL/uL — ABNORMAL LOW (ref 4.22–5.81)
RDW: 15.1 % (ref 11.5–15.5)
WBC: 16.9 10*3/uL — ABNORMAL HIGH (ref 4.0–10.5)
nRBC: 0 % (ref 0.0–0.2)

## 2020-05-19 LAB — BASIC METABOLIC PANEL
Anion gap: 7 (ref 5–15)
BUN: 10 mg/dL (ref 6–20)
CO2: 32 mmol/L (ref 22–32)
Calcium: 8.6 mg/dL — ABNORMAL LOW (ref 8.9–10.3)
Chloride: 98 mmol/L (ref 98–111)
Creatinine, Ser: 0.45 mg/dL — ABNORMAL LOW (ref 0.61–1.24)
GFR, Estimated: >60 mL/min (ref >60)
Glucose, Bld: 141 mg/dL — ABNORMAL HIGH (ref 70–99)
Potassium: 4.7 mmol/L (ref 3.5–5.1)
Sodium: 137 mmol/L (ref 135–145)

## 2020-05-19 LAB — GLUCOSE, CAPILLARY
Glucose-Capillary: 142 mg/dL — ABNORMAL HIGH (ref 70–99)
Glucose-Capillary: 185 mg/dL — ABNORMAL HIGH (ref 70–99)
Glucose-Capillary: 199 mg/dL — ABNORMAL HIGH (ref 70–99)
Glucose-Capillary: 188 mg/dL — ABNORMAL HIGH (ref 70–99)

## 2020-05-19 LAB — MAGNESIUM: Magnesium: 2 mg/dL (ref 1.7–2.4)

## 2020-05-19 MED ORDER — METHYLPREDNISOLONE SODIUM SUCC 40 MG IJ SOLR: 40.0000 mg | INTRAMUSCULAR | Status: DC

## 2020-05-19 MED ORDER — METHYLPREDNISOLONE SODIUM SUCC 40 MG IJ SOLR
40.0000 mg | Freq: Two times a day (BID) | INTRAMUSCULAR | Status: DC
  Administered 2020-05-19 – 2020-05-20 (×2): 40 mg via INTRAVENOUS
  Filled 2020-05-19 (×2): qty 1

## 2020-05-19 NOTE — Progress Notes (Addendum)
Inpatient Rehab Admissions Coordinator Note:   Per therapy recommendations, pt was screened for CIR candidacy by Shann Medal, PT, DPT.  Based on chart review, pt sedentary at baseline with end-stage COPD, on hospice at home.  Do not feel that pt is a candidate at this time due to likely poor tolerance for 3 hrs/day of therapy and plan for home with hospice.  Also note that [facility] is highly unlikely to approve orthopedic diagnoses for CIR.  Please contact me with questions.   Shann Medal, PT, DPT [phone] 05/19/20 11:37 AM

## 2020-05-19 NOTE — Progress Notes (Signed)
Orthopaedic Trauma Progress Note  SUBJECTIVE: Continues have pain, shoulder more bothersome than hip. Was able to gte to EOB and OOB with therapies yesterday. No chest pain. No nausea/vomiting. No other complaints.  Tolerating diet and fluids  OBJECTIVE:  Vitals:   05/19/20 0400 05/19/20 0808  BP: 99/74 103/73  Pulse: 76 (!) 55  Resp: 15 12  Temp: 98 F (36.7 C) 98.5 F (36.9 C)  SpO2: 98% 99%    General: Sitting up in bed, no acute distress.  Eating breakfast Respiratory: No increased work of breathing at rest, currently on supplemental O2 via [place].  RLE: Dressing clean, dry, intact.  Tender over the hip as expected.  Ankle dorsiflexion plantarflexion is intact.  Sensation is intact to dorsal and plantar aspect of the foot.  Neurovascular at baseline RUE: Sling not currently in place. Well-healed surgical incision over the posterior elbow.  Significant bruising over the shoulder and to the proximal portion of the humerus.  Thickening of the nails noted.  Tenderness with palpation about the shoulder.  Does not tolerate any shoulder motion currently secondary to pain.  Tolerates gentle passive elbow motion.  Has full wrist motion.  Motor and sensory function is intact.  Neurovascularly intact.  IMAGING: Stable post op imaging.   LABS:  Results for orders placed or performed during the hospital encounter of 05/15/20 (from the past 24 hour(s))  Glucose, capillary     Status: Abnormal   Collection Time: 05/18/20 12:27 PM  Result Value Ref Range   Glucose-Capillary 134 (H) 70 - 99 mg/dL  Glucose, capillary     Status: Abnormal   Collection Time: 05/18/20  5:06 PM  Result Value Ref Range   Glucose-Capillary 158 (H) 70 - 99 mg/dL  Glucose, capillary     Status: None   Collection Time: 05/18/20  8:11 PM  Result Value Ref Range   Glucose-Capillary 91 70 - 99 mg/dL  CBC with Differential/Platelet     Status: Abnormal   Collection Time: 05/19/20  4:06 AM  Result Value Ref Range   WBC 16.9  (H) 4.0 - 10.5 K/uL   RBC 3.78 (L) 4.22 - 5.81 MIL/uL   Hemoglobin 11.0 (L) 13.0 - 17.0 g/dL   HCT 34.8 (L) 39.0 - 52.0 %   MCV 92.1 80.0 - 100.0 fL   MCH 29.1 26.0 - 34.0 pg   MCHC 31.6 30.0 - 36.0 g/dL   RDW 15.1 11.5 - 15.5 %   Platelets 174 150 - 400 K/uL   nRBC 0.0 0.0 - 0.2 %   Neutrophils Relative % 92 %   Neutro Abs 15.5 (H) 1.7 - 7.7 K/uL   Lymphocytes Relative 2 %   Lymphs Abs 0.3 (L) 0.7 - 4.0 K/uL   Monocytes Relative 5 %   Monocytes Absolute 0.9 0.1 - 1.0 K/uL   Eosinophils Relative 0 %   Eosinophils Absolute 0.0 0.0 - 0.5 K/uL   Basophils Relative 0 %   Basophils Absolute 0.0 0.0 - 0.1 K/uL   Immature Granulocytes 1 %   Abs Immature Granulocytes 0.09 (H) 0.00 - 0.07 K/uL   Polychromasia PRESENT   Basic metabolic panel     Status: Abnormal   Collection Time: 05/19/20  4:06 AM  Result Value Ref Range   Sodium 137 135 - 145 mmol/L   Potassium 4.7 3.5 - 5.1 mmol/L   Chloride 98 98 - 111 mmol/L   CO2 32 22 - 32 mmol/L   Glucose, Bld 141 (H) 70 - 99  mg/dL   BUN 10 6 - 20 mg/dL   Creatinine, Ser 0.45 (L) 0.61 - 1.24 mg/dL   Calcium 8.6 (L) 8.9 - 10.3 mg/dL   GFR, Estimated >60 >60 mL/min   Anion gap 7 5 - 15  Magnesium     Status: None   Collection Time: 05/19/20  4:06 AM  Result Value Ref Range   Magnesium 2.0 1.7 - 2.4 mg/dL  Glucose, capillary     Status: Abnormal   Collection Time: 05/19/20  8:06 AM  Result Value Ref Range   Glucose-Capillary 142 (H) 70 - 99 mg/dL    ASSESSMENT: Cameron Villarreal is a 55 y.o. male, 2 Days Post-Op  s/p INTRAMEDULLARY NAIL RIGHT  INTERTROCHANTERIC FEMUR FRACTURE  CV/Blood loss: Acute blood loss anemia, Hgb 11.0.  PLAN: Weightbearing: WBAT RLE, NWB RUE Incisional and dressing care:  Plan to change RLE dressing tomrorow Showering: ok to begin showering with assistance 05/20/20 Orthopedic device(s): Sling for comfort RUE  Pain management:  1. Tylenol 1000 mg q 6 hours scheduled 2. Robaxin 500 mg q 6 hours PRN 3. Oxycodone  5-10 mg q 4 hours PRN 4. Neurontin 300 mg TID 5. Dilaudid 0.5-1 mg q 4 hours PRN 6. MS Contin 15 mg q 12 hours VTE prophylaxis: Lovenox, SCDs ID:  Ancef 2gm post op completed. On Azithromycin for COPD exacerbation Foley/Lines:  No foley, KVO IVFs Impediments to Fracture Healing: Vit D level 17, start D3 supplementation Dispo: Therapies as tolerated, PT/OT recommending CIR. Consult has been placed per primary team. Patient Ok for d/c from ortho standpoint once cleared by medicine team Follow - up plan: Will continue to follow while in hospital. Plan for outpatient follow-up 2 weeks for repeat x-rays  Contact information:  Katha Hamming MD, Patrecia Pace PA-C. After hours and holidays please check [email or web address] for group call information for [facility]   Macari Zalesky A. Ricci Barker, PA-C [phone] (office) [email or web address]

## 2020-05-19 NOTE — Care Management (Signed)
TOC following for discharge planning needs.

## 2020-05-19 NOTE — Plan of Care (Signed)
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
## 2020-05-20 ENCOUNTER — Ambulatory Visit: Payer: Medicare HMO | Admitting: Internal Medicine

## 2020-05-20 ENCOUNTER — Encounter (HOSPITAL_COMMUNITY): Payer: Self-pay | Admitting: Student

## 2020-05-20 DIAGNOSIS — Z66 Do not resuscitate: Secondary | ICD-10-CM

## 2020-05-20 DIAGNOSIS — S72001A Fracture of unspecified part of neck of right femur, initial encounter for closed fracture: Secondary | ICD-10-CM

## 2020-05-20 DIAGNOSIS — Z515 Encounter for palliative care: Secondary | ICD-10-CM

## 2020-05-20 DIAGNOSIS — Z7189 Other specified counseling: Secondary | ICD-10-CM

## 2020-05-20 DIAGNOSIS — R52 Pain, unspecified: Secondary | ICD-10-CM

## 2020-05-20 LAB — BASIC METABOLIC PANEL
Anion gap: 3 — ABNORMAL LOW (ref 5–15)
BUN: 7 mg/dL (ref 6–20)
CO2: 39 mmol/L — ABNORMAL HIGH (ref 22–32)
Calcium: 8.6 mg/dL — ABNORMAL LOW (ref 8.9–10.3)
Chloride: 96 mmol/L — ABNORMAL LOW (ref 98–111)
Creatinine, Ser: 0.46 mg/dL — ABNORMAL LOW (ref 0.61–1.24)
GFR, Estimated: >60 mL/min (ref >60)
Glucose, Bld: 216 mg/dL — ABNORMAL HIGH (ref 70–99)
Potassium: 4.2 mmol/L (ref 3.5–5.1)
Sodium: 138 mmol/L (ref 135–145)

## 2020-05-20 LAB — CBC WITH DIFFERENTIAL/PLATELET
Abs Immature Granulocytes: 0.08 10*3/uL — ABNORMAL HIGH (ref 0.00–0.07)
Basophils Absolute: 0 10*3/uL (ref 0.0–0.1)
Basophils Relative: 0 %
Eosinophils Absolute: 0 10*3/uL (ref 0.0–0.5)
Eosinophils Relative: 0 %
HCT: 37.2 % — ABNORMAL LOW (ref 39.0–52.0)
Hemoglobin: 11.3 g/dL — ABNORMAL LOW (ref 13.0–17.0)
Immature Granulocytes: 1 %
Lymphocytes Relative: 4 %
Lymphs Abs: 0.6 10*3/uL — ABNORMAL LOW (ref 0.7–4.0)
MCH: 28.7 pg (ref 26.0–34.0)
MCHC: 30.4 g/dL (ref 30.0–36.0)
MCV: 94.4 fL (ref 80.0–100.0)
Monocytes Absolute: 0.9 10*3/uL (ref 0.1–1.0)
Monocytes Relative: 6 %
Neutro Abs: 13.9 10*3/uL — ABNORMAL HIGH (ref 1.7–7.7)
Neutrophils Relative %: 89 %
Platelets: 269 10*3/uL (ref 150–400)
RBC: 3.94 MIL/uL — ABNORMAL LOW (ref 4.22–5.81)
RDW: 14.9 % (ref 11.5–15.5)
WBC: 15.5 10*3/uL — ABNORMAL HIGH (ref 4.0–10.5)
nRBC: 0 % (ref 0.0–0.2)

## 2020-05-20 LAB — GLUCOSE, CAPILLARY
Glucose-Capillary: 136 mg/dL — ABNORMAL HIGH (ref 70–99)
Glucose-Capillary: 161 mg/dL — ABNORMAL HIGH (ref 70–99)
Glucose-Capillary: 106 mg/dL — ABNORMAL HIGH (ref 70–99)
Glucose-Capillary: 92 mg/dL (ref 70–99)

## 2020-05-20 LAB — MAGNESIUM: Magnesium: 2 mg/dL (ref 1.7–2.4)

## 2020-05-20 MED ORDER — MORPHINE SULFATE ER 15 MG PO TBCR
15.0000 mg | EXTENDED_RELEASE_TABLET | Freq: Three times a day (TID) | ORAL | Status: DC
  Administered 2020-05-20 – 2020-05-22 (×6): 15 mg via ORAL
  Filled 2020-05-20 (×6): qty 1

## 2020-05-20 MED ORDER — PREDNISONE 20 MG PO TABS
50.0000 mg | ORAL_TABLET | Freq: Every day | ORAL | Status: DC
  Administered 2020-05-21 – 2020-05-22 (×2): 50 mg via ORAL
  Filled 2020-05-20 (×2): qty 2
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
## 2020-05-20 NOTE — TOC Initial Note (Signed)
Transition of Care [facility]) - Initial/Assessment Note    Patient Details  Name: Cameron Villarreal MRN: [id number] Date of Birth: Jul 29, 1965  Transition of Care [facility]) CM/SW Contact:    Carles Collet, RN Phone Number: 05/20/2020, 10:54 AM  Clinical Narrative:         Damaris Schooner w patient over the phone. He confirms that he is from home w home hospice services [place]. They have been notified of his admission. He has hospital bed, oxygen, and RW.  Potential need for WC.            Expected Discharge Plan: Home w Hospice Care Barriers to Discharge: Continued Medical Work up   Patient Goals and CMS Choice Patient states their goals for this hospitalization and ongoing recovery are:: to return home w home hospice care through [facility] [place]   Choice offered to / list presented to : NA  Expected Discharge Plan and Services Expected Discharge Plan: Home w Hospice Care   Discharge Planning Services: CM Consult Post Acute Care Choice: Resumption of Svcs/PTA Provider Living arrangements for the past 2 months: Single Family Home                                      Prior Living Arrangements/Services Living arrangements for the past 2 months: Single Family Home                Current home services: Hospice    Activities of Daily Living Home Assistive Devices/Equipment: None ADL Screening (condition at time of admission) Patient's cognitive ability adequate to safely complete daily activities?: Yes Is the patient deaf or have difficulty hearing?: No Does the patient have difficulty seeing, even when wearing glasses/contacts?: No Does the patient have difficulty concentrating, remembering, or making decisions?: No Patient able to express need for assistance with ADLs?: Yes Does the patient have difficulty dressing or bathing?: No Independently performs ADLs?: Yes (appropriate for developmental age) Does the patient have difficulty walking or climbing  stairs?: No Weakness of Legs: Both Weakness of Arms/Hands: Right  Permission Sought/Granted                  Emotional Assessment              Admission diagnosis:  SOB (shortness of breath) [R06.02] Fall [W19.XXXA] COPD exacerbation ([place]) [J44.1] Closed fracture of right hip, initial encounter ([place]) [S72.001A] Acute on chronic respiratory failure ([place]) [J96.20] Acute on chronic respiratory failure with hypoxia and hypercapnia (H[facility]) [J96.21, J96.22] Closed fracture of proximal end of right humerus, unspecified fracture morphology, initial encounter [S42.201A] Patient Active Problem List   Diagnosis Date Noted  . Closed right hip fracture ([place]) 05/15/2020  . Hospice care patient 04/14/2020  . Protein-calorie malnutrition, severe 04/09/2020  . Positive ANA (antinuclear antibody) 02/20/2020  . Bronchiectasis with acute exacerbation ([place]) 10/07/2019  . Chronic obstructive pulmonary disease, unspecified ([place])   . Low back pain   . Anxiety disorder, unspecified   . Insomnia   . Other specified disorders of teeth and supporting structures   . Unilateral inguinal hernia, without obstruction or gangrene, not specified as recurrent   . Vitamin D insufficiency 04/16/2018  . Elevated sed rate 04/16/2018  . Elevated C-reactive protein (CRP) 04/16/2018  . Chronic elbow pain, right (Primary Area of Pain) 04/12/2018  . Wrist pain, chronic, right (Secondary Area of Pain) 04/12/2018  . Chronic pain syndrome  04/12/2018  . Chronic pain of right upper extremity (Tertiary Area of Pain) 04/12/2018  . Encounter to establish care 04/12/2018  . Disorder of skeletal system 04/12/2018  . Disp fx of right radial styloid process, init for clos fx 12/14/2017  . Fracture of right ulnar styloid 12/14/2017  . Closed fracture of right distal humerus 11/16/2017  . Drug abuse ([place]) 01/30/2017  . Acute on chronic respiratory failure with hypercapnia ([place]) 01/26/2017  . GERD (gastroesophageal reflux  disease) 01/26/2017  . Hypercholesterolemia 01/26/2017  . Elevated brain natriuretic peptide (BNP) level 01/26/2017  . Elevated troponin 01/26/2017  . Anxiety 01/26/2017  . Thrombocytosis 01/26/2017  . COPD exacerbation ([place]) 12/25/2014  . Malnutrition of moderate degree ([place]) 07/03/2014  . Polycythemia 07/01/2014  . Anxiety state 06/24/2013  . Chronic back pain greater than 3 months duration 06/24/2013  . Respiratory failure, acute ([place]) 06/22/2013  . Pulmonary nodule 07/04/2011  . Hyperglycemia, drug-induced 07/04/2011  . Tobacco abuse 07/02/2011   PCP:  Lindell Spar, MD Pharmacy:   [facility], [address] [place] [address] Phone: [phone] Fax: [phone]     Social Determinants of Health (SDOH) Interventions    Readmission Risk Interventions No flowsheet data found.

## 2020-05-20 NOTE — Progress Notes (Signed)
Nutrition Brief Note  Chart reviewed. Pt now transitioning to comfort care.  No further nutrition interventions planned at this time.  Please re-consult as needed.   Jisele Price, MS, RD, LDN RD pager number and weekend/on-call pager number located in Amion.    
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
## 2020-05-21 DIAGNOSIS — S42201A Unspecified fracture of upper end of right humerus, initial encounter for closed fracture: Secondary | ICD-10-CM

## 2020-05-21 LAB — GLUCOSE, CAPILLARY
Glucose-Capillary: 71 mg/dL (ref 70–99)
Glucose-Capillary: 136 mg/dL — ABNORMAL HIGH (ref 70–99)
Glucose-Capillary: 151 mg/dL — ABNORMAL HIGH (ref 70–99)
Glucose-Capillary: 134 mg/dL — ABNORMAL HIGH (ref 70–99)

## 2020-05-21 LAB — SARS CORONAVIRUS 2 (TAT 6-24 HRS): SARS Coronavirus 2: NEGATIVE

## 2020-05-21 MED ORDER — IPRATROPIUM-ALBUTEROL 0.5-2.5 (3) MG/3ML IN SOLN
3.0000 mL | Freq: Four times a day (QID) | RESPIRATORY_TRACT | Status: DC
  Administered 2020-05-21 – 2020-05-22 (×5): 3 mL via RESPIRATORY_TRACT
  Filled 2020-05-21 (×4): qty 3
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
## 2020-05-21 NOTE — TOC Progression Note (Addendum)
Transition of Care [facility]) - Progression Note    Patient Details  Name: Cameron Villarreal MRN: [id number] Date of Birth: [birth year]  Transition of [facility] CM/SW Contact  Bartholomew Crews, RN Phone Number: [phone] 05/21/2020, 3:55 PM  Clinical Narrative:     Received call from [name] case manager] at [phone] to discuss patient progress. Discussed pallitative care and pain management driving his care - patient continues to require pain management. [name] suggested checking with [facility] about patient having a stay for pain management before transitioning home. [facility] has bed availability today. Spoke with patient about this transition - patient not sure he is ready to transition out of hospital. However, patient discussed situation with [name] and he is agreeable to transitioning to [facility]. Patient will need covid test. Will plan on [facility] transport in the morning. TOC following for transition needs.   Expected Discharge Plan: Home w Hospice Care Barriers to Discharge: Continued Medical Work up  Expected Discharge Plan and Services Expected Discharge Plan: [place]   Discharge Planning Services: CM Consult Post Acute Care Choice: Resumption of Svcs/PTA Provider Living arrangements for the past 2 months: Single Family Home                                       Social Determinants of Health (SDOH) Interventions    Readmission Risk Interventions No flowsheet data found.
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
## 2020-05-21 NOTE — Care Management Important Message (Signed)
Important Message  Patient Details  Name: Cameron DEMEO MRN: [id number] Date of Birth: 1965-04-25   Medicare Important Message Given:  Yes     Hargun Spurling [address] 05/21/2020, 2:25 PM

## 2020-05-21 NOTE — Progress Notes (Signed)
   05/21/20 0804  MEWS Score/Color  MEWS Score 1  MEWS Score Color Green  Respiratory Assessment  Respiratory Pattern Regular  Chest Assessment Chest expansion symmetrical  Cough None  Bilateral Breath Sounds Diminished;Clear  R Upper  Breath Sounds Clear  L Upper Breath Sounds Clear  R Lower Breath Sounds Diminished  L Lower Breath Sounds Diminished  Pt  takes Duoneb QID. Will change to his home treatment

## 2020-05-22 ENCOUNTER — Encounter (HOSPITAL_COMMUNITY): Payer: Self-pay | Admitting: Student

## 2020-05-22 LAB — GLUCOSE, CAPILLARY: Glucose-Capillary: 82 mg/dL (ref 70–99)

## 2020-05-22 MED ORDER — VITAMIN D3 25 MCG PO TABS: 1000.0000 [IU] | ORAL_TABLET | Freq: Two times a day (BID) | ORAL | Status: DC

## 2020-05-22 MED ORDER — ENOXAPARIN SODIUM 40 MG/0.4ML ~~LOC~~ SOLN: 40.0000 mg | SUBCUTANEOUS | 0 refills | Status: AC

## 2020-05-22 MED ORDER — BUPIVACAINE IN DEXTROSE 0.75-8.25 % IT SOLN
INTRATHECAL | Status: DC | PRN
  Administered 2020-05-17: 1.8 mL via INTRATHECAL

## 2020-05-22 MED ORDER — SENNOSIDES-DOCUSATE SODIUM 8.6-50 MG PO TABS: 2.0000 | ORAL_TABLET | Freq: Every day | ORAL | Status: AC

## 2020-05-22 NOTE — Anesthesia Procedure Notes (Signed)
Spinal  Patient location during procedure: OR Start time: 05/17/2020 2:38 PM End time: 05/17/2020 2:46 PM Reason for block: surgical anesthesia Staffing Performed: anesthesiologist  Anesthesiologist: Oleta Mouse, MD Preanesthetic Checklist Completed: patient identified, IV checked, risks and benefits discussed, surgical consent, monitors and equipment checked, pre-op evaluation and timeout performed Spinal Block Patient position: right lateral decubitus Prep: DuraPrep Patient monitoring: heart rate, cardiac monitor, continuous pulse ox and blood pressure Approach: midline Location: L4-5 Injection technique: single-shot Needle Needle type: Pencan  Needle gauge: 24 G Needle length: 9 cm Assessment Sensory level: T6 Events: CSF return

## 2020-05-22 NOTE — Discharge Summary (Signed)
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 an Emergency Use Authorization (EUA). This EUA will remain in effect (meaning this test can be used) for the duration of the COVID-19 declaration under Section 564(b)(1) of the Act, 21 U.S.C. section 360bbb-3(b)(1), unless the authorization is terminated or revoked.  Performed at [facility], [address] [place], [place] [address]   Surgical pcr screen     Status: None   Collection Time: 05/17/20 10:36 AM   Specimen: Nasal Mucosa; Nasal Swab  Result Value Ref Range Status   MRSA, PCR NEGATIVE NEGATIVE Final    Staphylococcus aureus NEGATIVE NEGATIVE Final    Comment: (NOTE) The Xpert SA Assay (FDA approved for NASAL specimens in patients 34 years of age and older), is one component of a comprehensive surveillance program. It is not intended to diagnose infection nor to guide or monitor treatment. Performed  at [facility], [place] [address] [place], [place] [address]   SARS CORONAVIRUS 2 (TAT 6-24 HRS) Nasopharyngeal Nasopharyngeal Swab     Status: None   Collection Time: 05/21/20  5:11 PM   Specimen: Nasopharyngeal Swab  Result Value Ref Range Status   SARS Coronavirus 2 NEGATIVE NEGATIVE Final    Comment: (NOTE) SARS-CoV-2 target nucleic acids are NOT DETECTED.  The SARS-CoV-2 RNA is generally detectable in upper and lower respiratory specimens during the acute phase of infection. Negative results do not preclude SARS-CoV-2 infection, do not rule out co-infections with other pathogens, and should not be used as the sole basis for treatment or other patient management decisions. Negative results must be combined with clinical observations, patient history, and epidemiological information. The expected result is Negative.  Fact Sheet for Patients: SugarRoll.be  Fact Sheet for Healthcare Providers: https://www.woods-mathews.com/  This test is not yet approved or cleared by the [place] FDA and  has been authorized for detection and/or diagnosis of SARS-CoV-2 by FDA under an Emergency Use Authorization (EUA). This EUA will remain  in effect (meaning this test can be used) for the duration of the COVID-19 declaration under Se ction 564(b)(1) of the Act, 21 U.S.C. section 360bbb-3(b)(1), unless the authorization is terminated or revoked sooner.  Performed at [facility], [place] [address], [place], [place] [address]      Time coordinating discharge:  40 minutes  SIGNED:   Barb Merino, MD  [facility] 05/22/2020,  8:57 AM

## 2020-05-22 NOTE — Progress Notes (Signed)
Orthopaedic Trauma Progress Note  SUBJECTIVE: Shoulder pain relatively unchanged. Hip improving. No chest pain. No nausea/vomiting. No other complaints.  Tolerating diet and fluids. Was able to get to bedside chair yesterday. Mother at bedside this morning  OBJECTIVE:  Vitals:   05/22/20 0742 05/22/20 0932  BP: 114/83   Pulse: 71   Resp: 11   Temp: 98 F (36.7 C)   SpO2: 99% 92%    General: Sitting up in bed, no acute distress.   RLE: Incisions clean, dry, intact. Mildly tender over the hip as expected.  Ankle dorsiflexion plantarflexion is intact.  Sensation is intact to dorsal and plantar aspect of the foot.  Neurovascular at baseline RUE: Sling in place. Well-healed surgical incision over the posterior elbow.  Significant bruising over the shoulder and to the proximal portion of the humerus.  Thickening of the nails noted.  Tenderness with palpation about the shoulder.  Does not tolerate any shoulder motion currently secondary to pain.  Tolerates gentle passive elbow motion.  Has full wrist motion.  Motor and sensory function is intact.  Neurovascularly intact.  IMAGING: Stable post op imaging.   LABS:  Results for orders placed or performed during the hospital encounter of 05/15/20 (from the past 24 hour(s))  Glucose, capillary     Status: Abnormal   Collection Time: 05/21/20 11:35 AM  Result Value Ref Range   Glucose-Capillary 136 (H) 70 - 99 mg/dL  Glucose, capillary     Status: Abnormal   Collection Time: 05/21/20  3:50 PM  Result Value Ref Range   Glucose-Capillary 151 (H) 70 - 99 mg/dL  SARS CORONAVIRUS 2 (TAT 6-24 HRS) Nasopharyngeal Nasopharyngeal Swab     Status: None   Collection Time: 05/21/20  5:11 PM   Specimen: Nasopharyngeal Swab  Result Value Ref Range   SARS Coronavirus 2 NEGATIVE NEGATIVE  Glucose, capillary     Status: Abnormal   Collection Time: 05/21/20  9:26 PM  Result Value Ref Range   Glucose-Capillary 134 (H) 70 - 99 mg/dL  Glucose, capillary      Status: None   Collection Time: 05/22/20  7:40 AM  Result Value Ref Range   Glucose-Capillary 82 70 - 99 mg/dL    ASSESSMENT: Cameron Villarreal is a 55 y.o. male, 5 Days Post-Op  s/p INTRAMEDULLARY NAIL RIGHT  INTERTROCHANTERIC FEMUR FRACTURE  CV/Blood loss: Acute blood loss anemia, Hgb 11.3 this morning.  PLAN: Weightbearing: WBAT RLE, NWB RUE Incisional and dressing care:  Ok to leave incisions open to air Showering: ok to begin showering with assistance Orthopedic device(s): Sling for comfort RUE  Pain management: continue current regimen VTE prophylaxis: Lovenox, SCDs ID:  abx completed Foley/Lines:  No foley, KVO IVFs Impediments to Fracture Healing: Vit D level 17, continue D3 supplementation Dispo: Discharging to hospice home today. Follow - up plan: Plan for outpatient follow-up 2 weeks-3 weeks for repeat x-rays right hip and right shoulder  Contact information:  Katha Hamming MD, Patrecia Pace PA-C. After hours and holidays please check [email or web address] for group call information for [facility]   Lovell Nuttall A. Ricci Barker, PA-C [phone] (office) [email or web address]

## 2020-05-22 NOTE — TOC Transition Note (Signed)
Transition of Care [facility]) - CM/SW Discharge Note   Patient Details  Name: Cameron Villarreal MRN: [id number] Date of Birth: [birth year]  [facility]) CM/SW Contact:  Bartholomew Crews, RN Phone Number: [phone] 05/22/2020, 9:48 AM   Clinical Narrative:     Patient to [facility] to [facility]) today. Spoke with [facility] to confirm bed ready. DC summary, op note, and covid test faxed to [phone]. Nurse to call report to [phone]. [facility] arranged for transport - Medical transport paperwork completed. Patient notified and agreeable to transition - mom at bedside at time of transition discussion.   Final next level of care: [place] Barriers to Discharge: No Barriers Identified   Patient Goals and CMS Choice Patient states their goals for this hospitalization and ongoing recovery are:: [facility] CMS Medicare.gov Compare Post Acute Care list provided to:: Patient Choice offered to / list presented to : NA  Discharge Placement                Patient to be transferred to facility by: [facility]   Patient and family notified of of transfer: 05/22/20  Discharge Plan and Services   Discharge Planning Services: CM Consult Post Acute Care Choice: Resumption of Svcs/PTA Provider          DME Arranged: N/A DME Agency: NA       HH Arranged: NA HH Agency: NA        Social Determinants of Health (SDOH) Interventions     Readmission Risk Interventions No flowsheet data found.

## 2020-05-22 NOTE — Anesthesia Postprocedure Evaluation (Signed)
Anesthesia Post Note  Patient: [name]  Procedure(s) Performed: INTRAMEDULLARY (IM) NAIL INTERTROCHANTRIC (Right Hip)     Patient location during evaluation: PACU Anesthesia Type: MAC and Spinal Level of consciousness: patient cooperative and awake Pain management: pain level controlled Vital Signs Assessment: post-procedure vital signs reviewed and stable Respiratory status: spontaneous breathing, nonlabored ventilation, respiratory function stable and patient connected to nasal cannula oxygen Cardiovascular status: stable and blood pressure returned to baseline Postop Assessment: no apparent nausea or vomiting Anesthetic complications: no   No complications documented.  Last Vitals:  Vitals:   05/22/20 0742 05/22/20 0932  BP: 114/83   Pulse: 71   Resp: 11   Temp: 36.7 C   SpO2: 99% 92%    Last Pain:  Vitals:   05/22/20 0742  TempSrc: Oral  PainSc:                  Sharetta Ricchio

## 2020-05-26 ENCOUNTER — Telehealth: Payer: Self-pay

## 2020-05-26 NOTE — Telephone Encounter (Signed)
TOC not completed due to pt being discharged to hospice

## 2020-05-31 DIAGNOSIS — E785 Hyperlipidemia, unspecified: Secondary | ICD-10-CM

## 2020-05-31 DIAGNOSIS — R42 Dizziness and giddiness: Secondary | ICD-10-CM | POA: Diagnosis not present

## 2020-05-31 DIAGNOSIS — R531 Weakness: Secondary | ICD-10-CM | POA: Diagnosis not present

## 2020-05-31 DIAGNOSIS — J449 Chronic obstructive pulmonary disease, unspecified: Secondary | ICD-10-CM | POA: Diagnosis not present

## 2020-05-31 DIAGNOSIS — R0602 Shortness of breath: Secondary | ICD-10-CM

## 2020-05-31 DIAGNOSIS — F419 Anxiety disorder, unspecified: Secondary | ICD-10-CM

## 2020-05-31 DIAGNOSIS — K219 Gastro-esophageal reflux disease without esophagitis: Secondary | ICD-10-CM

## 2020-05-31 DIAGNOSIS — E43 Unspecified severe protein-calorie malnutrition: Secondary | ICD-10-CM

## 2020-05-31 DIAGNOSIS — R1310 Dysphagia, unspecified: Secondary | ICD-10-CM | POA: Diagnosis not present

## 2020-05-31 DIAGNOSIS — R69 Illness, unspecified: Secondary | ICD-10-CM | POA: Diagnosis not present

## 2020-06-01 DIAGNOSIS — Z7401 Bed confinement status: Secondary | ICD-10-CM | POA: Diagnosis not present

## 2020-06-01 DIAGNOSIS — Z743 Need for continuous supervision: Secondary | ICD-10-CM | POA: Diagnosis not present

## 2020-06-01 DIAGNOSIS — R0902 Hypoxemia: Secondary | ICD-10-CM | POA: Diagnosis not present

## 2020-06-08 DIAGNOSIS — E43 Unspecified severe protein-calorie malnutrition: Secondary | ICD-10-CM

## 2020-06-08 DIAGNOSIS — R69 Illness, unspecified: Secondary | ICD-10-CM | POA: Diagnosis not present

## 2020-06-08 DIAGNOSIS — R531 Weakness: Secondary | ICD-10-CM | POA: Diagnosis not present

## 2020-06-08 DIAGNOSIS — E785 Hyperlipidemia, unspecified: Secondary | ICD-10-CM

## 2020-06-08 DIAGNOSIS — F419 Anxiety disorder, unspecified: Secondary | ICD-10-CM

## 2020-06-08 DIAGNOSIS — K219 Gastro-esophageal reflux disease without esophagitis: Secondary | ICD-10-CM

## 2020-06-08 DIAGNOSIS — J449 Chronic obstructive pulmonary disease, unspecified: Secondary | ICD-10-CM | POA: Diagnosis not present

## 2020-06-08 DIAGNOSIS — R0602 Shortness of breath: Secondary | ICD-10-CM

## 2020-06-08 DIAGNOSIS — R1310 Dysphagia, unspecified: Secondary | ICD-10-CM | POA: Diagnosis not present

## 2020-06-08 DIAGNOSIS — R42 Dizziness and giddiness: Secondary | ICD-10-CM | POA: Diagnosis not present

## 2020-06-18 DIAGNOSIS — R42 Dizziness and giddiness: Secondary | ICD-10-CM | POA: Diagnosis not present

## 2020-06-18 DIAGNOSIS — J449 Chronic obstructive pulmonary disease, unspecified: Secondary | ICD-10-CM | POA: Diagnosis not present

## 2020-06-18 DIAGNOSIS — E785 Hyperlipidemia, unspecified: Secondary | ICD-10-CM

## 2020-06-18 DIAGNOSIS — F419 Anxiety disorder, unspecified: Secondary | ICD-10-CM

## 2020-06-18 DIAGNOSIS — R1310 Dysphagia, unspecified: Secondary | ICD-10-CM | POA: Diagnosis not present

## 2020-06-18 DIAGNOSIS — R69 Illness, unspecified: Secondary | ICD-10-CM | POA: Diagnosis not present

## 2020-06-18 DIAGNOSIS — R0602 Shortness of breath: Secondary | ICD-10-CM

## 2020-06-18 DIAGNOSIS — K219 Gastro-esophageal reflux disease without esophagitis: Secondary | ICD-10-CM

## 2020-06-18 DIAGNOSIS — E43 Unspecified severe protein-calorie malnutrition: Secondary | ICD-10-CM

## 2020-06-18 DIAGNOSIS — R531 Weakness: Secondary | ICD-10-CM | POA: Diagnosis not present

## 2020-06-26 ENCOUNTER — Other Ambulatory Visit (HOSPITAL_COMMUNITY): Payer: Self-pay

## 2020-06-26 NOTE — Progress Notes (Signed)
Call placed to patient's mother regarding follow-up LDCT. Mother states that the patient is on hospice and unable to follow-up. PCP aware, I will not reach back out to the patient as he will be further managed by Hospice

## 2020-07-29 ENCOUNTER — Emergency Department (HOSPITAL_COMMUNITY): Payer: Medicare Other

## 2020-07-29 ENCOUNTER — Encounter (HOSPITAL_COMMUNITY): Payer: Self-pay

## 2020-07-29 ENCOUNTER — Emergency Department (HOSPITAL_COMMUNITY)
Admission: EM | Admit: 2020-07-29 | Discharge: 2020-07-29 | Disposition: A | Discharge status: Home / Self Care | Payer: Medicare Other | Attending: Emergency Medicine | Admitting: Emergency Medicine

## 2020-07-29 ENCOUNTER — Other Ambulatory Visit: Payer: Self-pay

## 2020-07-29 DIAGNOSIS — R059 Cough, unspecified: Secondary | ICD-10-CM | POA: Diagnosis not present

## 2020-07-29 DIAGNOSIS — J189 Pneumonia, unspecified organism: Secondary | ICD-10-CM | POA: Diagnosis not present

## 2020-07-29 DIAGNOSIS — F17210 Nicotine dependence, cigarettes, uncomplicated: Secondary | ICD-10-CM | POA: Diagnosis not present

## 2020-07-29 DIAGNOSIS — J441 Chronic obstructive pulmonary disease with (acute) exacerbation: Secondary | ICD-10-CM | POA: Diagnosis not present

## 2020-07-29 DIAGNOSIS — Z79899 Other long term (current) drug therapy: Secondary | ICD-10-CM | POA: Insufficient documentation

## 2020-07-29 DIAGNOSIS — Z7401 Bed confinement status: Secondary | ICD-10-CM | POA: Diagnosis not present

## 2020-07-29 DIAGNOSIS — R0602 Shortness of breath: Secondary | ICD-10-CM | POA: Diagnosis present

## 2020-07-29 DIAGNOSIS — R52 Pain, unspecified: Secondary | ICD-10-CM | POA: Diagnosis not present

## 2020-07-29 DIAGNOSIS — Z20822 Contact with and (suspected) exposure to covid-19: Secondary | ICD-10-CM | POA: Diagnosis not present

## 2020-07-29 DIAGNOSIS — R279 Unspecified lack of coordination: Secondary | ICD-10-CM | POA: Diagnosis not present

## 2020-07-29 DIAGNOSIS — R0902 Hypoxemia: Secondary | ICD-10-CM | POA: Diagnosis not present

## 2020-07-29 DIAGNOSIS — R5381 Other malaise: Secondary | ICD-10-CM | POA: Diagnosis not present

## 2020-07-29 DIAGNOSIS — Z743 Need for continuous supervision: Secondary | ICD-10-CM | POA: Diagnosis not present

## 2020-07-29 LAB — CBC WITH DIFFERENTIAL/PLATELET
Abs Immature Granulocytes: 0.14 10*3/uL — ABNORMAL HIGH (ref 0.00–0.07)
Basophils Absolute: 0.1 10*3/uL (ref 0.0–0.1)
Basophils Relative: 1 %
Eosinophils Absolute: 0.2 10*3/uL (ref 0.0–0.5)
Eosinophils Relative: 1 %
HCT: 51.9 % (ref 39.0–52.0)
Hemoglobin: 15 g/dL (ref 13.0–17.0)
Immature Granulocytes: 1 %
Lymphocytes Relative: 8 %
Lymphs Abs: 1.5 10*3/uL (ref 0.7–4.0)
MCH: 28.7 pg (ref 26.0–34.0)
MCHC: 28.9 g/dL — ABNORMAL LOW (ref 30.0–36.0)
MCV: 99.4 fL (ref 80.0–100.0)
Monocytes Absolute: 1.6 10*3/uL — ABNORMAL HIGH (ref 0.1–1.0)
Monocytes Relative: 9 %
Neutro Abs: 15.2 10*3/uL — ABNORMAL HIGH (ref 1.7–7.7)
Neutrophils Relative %: 80 %
Platelets: 319 10*3/uL (ref 150–400)
RBC: 5.22 MIL/uL (ref 4.22–5.81)
RDW: 14.2 % (ref 11.5–15.5)
WBC: 18.8 10*3/uL — ABNORMAL HIGH (ref 4.0–10.5)
nRBC: 0 % (ref 0.0–0.2)

## 2020-07-29 LAB — BASIC METABOLIC PANEL
Anion gap: 9 (ref 5–15)
BUN: 6 mg/dL (ref 6–20)
CO2: 43 mmol/L — ABNORMAL HIGH (ref 22–32)
Calcium: 9.4 mg/dL (ref 8.9–10.3)
Chloride: 87 mmol/L — ABNORMAL LOW (ref 98–111)
Creatinine, Ser: 0.53 mg/dL — ABNORMAL LOW (ref 0.61–1.24)
GFR, Estimated: >60 mL/min (ref >60)
Glucose, Bld: 133 mg/dL — ABNORMAL HIGH (ref 70–99)
Potassium: 3.8 mmol/L (ref 3.5–5.1)
Sodium: 139 mmol/L (ref 135–145)

## 2020-07-29 LAB — POC SARS CORONAVIRUS 2 AG -  ED: SARSCOV2ONAVIRUS 2 AG: NEGATIVE

## 2020-07-29 MED ORDER — DOXYCYCLINE HYCLATE 100 MG PO TABS
100.0000 mg | ORAL_TABLET | Freq: Once | ORAL | Status: AC
  Administered 2020-07-29: 100 mg via ORAL
  Filled 2020-07-29: qty 1

## 2020-07-29 MED ORDER — IPRATROPIUM-ALBUTEROL 0.5-2.5 (3) MG/3ML IN SOLN
3.0000 mL | Freq: Once | RESPIRATORY_TRACT | Status: AC
  Administered 2020-07-29: 3 mL via RESPIRATORY_TRACT
  Filled 2020-07-29: qty 3

## 2020-07-29 MED ORDER — ALBUTEROL SULFATE (2.5 MG/3ML) 0.083% IN NEBU
2.5000 mg | INHALATION_SOLUTION | Freq: Once | RESPIRATORY_TRACT | Status: AC
  Administered 2020-07-29: 2.5 mg via RESPIRATORY_TRACT
  Filled 2020-07-29: qty 3

## 2020-07-29 MED ORDER — DOXYCYCLINE HYCLATE 100 MG PO CAPS: 100.0000 mg | ORAL_CAPSULE | Freq: Two times a day (BID) | ORAL | 0 refills | Status: AC

## 2020-07-29 NOTE — Discharge Instructions (Addendum)
Follow-up with your doctor if not improving 

## 2020-07-29 NOTE — ED Provider Notes (Signed)
[facility] EMERGENCY DEPARTMENT Provider Note   CSN: [id number] Arrival date & time: 07/29/20  1002     History Chief Complaint  Patient presents with   Shortness of Breath    Cameron Villarreal is a 55 y.o. male.  Patient presents with some shortness of breath.  He has COPD and is on 3 L.  He is a DNR along with only comfort care but antibiotics and fluids would be fine.  Patient complains of mild shortness of breath with yellow sputum production  The history is provided by the patient. No language interpreter was used.  Shortness of Breath Severity:  Mild Onset quality:  Sudden Duration: 3 days. Timing:  Intermittent Progression:  Unable to specify Chronicity:  New Context: not activity   Relieved by:  Nothing Worsened by:  Nothing Ineffective treatments:  None tried Associated symptoms: wheezing   Associated symptoms: no abdominal pain, no chest pain, no cough, no headaches and no rash       Past Medical History:  Diagnosis Date   Anxiety    Anxiety disorder, unspecified    Back pain    Chest pain at rest 07/26/2011   Chronic obstructive pulmonary disease, unspecified (HCC)    COPD (chronic obstructive pulmonary disease) (HCC)    Severe centrilobular emphysema, on home o2   Cough    Dyspnea    GERD (gastroesophageal reflux disease)    Hypercholesterolemia    Insomnia    Long term current use of opiate analgesic 04/12/2018   Low back pain    Nicotine dependence, unspecified, uncomplicated    Other specified disorders of teeth and supporting structures    Pneumonia    Pulmonary nodule 07/04/2011   RECTAL BLEEDING 04/21/2010   Qualifier: Diagnosis of  By: Algernon Huxley NPDennard Schaumann colonoscopy March 2012   Normal   S/P endoscopy March 2012   Reflux esophagitis, no ulcerations   Shortness of breath    Unilateral inguinal hernia, without obstruction or gangrene, not specified as recurrent    Unspecified protein-calorie malnutrition ([place])     Patient Active Problem  List   Diagnosis Date Noted   Closed right hip fracture ([place]) 05/15/2020   Hospice care patient 04/14/2020   Protein-calorie malnutrition, severe 04/09/2020   Positive ANA (antinuclear antibody) 02/20/2020   Bronchiectasis with acute exacerbation ([place]) 10/07/2019   Chronic obstructive pulmonary disease, unspecified (HCC)    Low back pain    Anxiety disorder, unspecified    Insomnia    Other specified disorders of teeth and supporting structures    Unilateral inguinal hernia, without obstruction or gangrene, not specified as recurrent    Vitamin D insufficiency 04/16/2018   Elevated sed rate 04/16/2018   Elevated C-reactive protein (CRP) 04/16/2018   Chronic elbow pain, right (Primary Area of Pain) 04/12/2018   Wrist pain, chronic, right (Secondary Area of Pain) 04/12/2018   Chronic pain syndrome 04/12/2018   Chronic pain of right upper extremity (Tertiary Area of Pain) 04/12/2018   Encounter to establish care 04/12/2018   Disorder of skeletal system 04/12/2018   Disp fx of right radial styloid process, init for clos fx 12/14/2017   Fracture of right ulnar styloid 12/14/2017   Closed fracture of right distal humerus 11/16/2017   Drug abuse ([place]) 01/30/2017   Acute on chronic respiratory failure with hypercapnia ([place]) 01/26/2017   GERD (gastroesophageal reflux disease) 01/26/2017   Hypercholesterolemia 01/26/2017   Elevated brain natriuretic peptide (BNP) level 01/26/2017   Elevated  troponin 01/26/2017   Anxiety 01/26/2017   Thrombocytosis 01/26/2017   COPD exacerbation ([place]) 12/25/2014   Malnutrition of moderate degree ([place]) 07/03/2014   Polycythemia 07/01/2014   Anxiety state 06/24/2013   Chronic back pain greater than 3 months duration 06/24/2013   Respiratory failure, acute ([place]) 06/22/2013   Pulmonary nodule 07/04/2011   Hyperglycemia, drug-induced 07/04/2011   Tobacco abuse 07/02/2011    Past Surgical History:  Procedure Laterality Date   APPENDECTOMY      CHOLECYSTECTOMY     COLONOSCOPY     HERNIA REPAIR     INTRAMEDULLARY (IM) NAIL INTERTROCHANTERIC Right 05/17/2020   Procedure: INTRAMEDULLARY (IM) NAIL INTERTROCHANTRIC;  Surgeon: Shona Needles, MD;  Location: [place];  Service: Orthopedics;  Laterality: Right;   KNEE SURGERY Right    laceration wired back together   OPEN REDUCTION INTERNAL FIXATION (ORIF) DISTAL RADIAL FRACTURE Right 11/17/2017   Procedure: OPEN REDUCTION INTERNAL FIXATION (ORIF) DISTAL RADIAL FRACTURE;  Surgeon: Shona Needles, MD;  Location: [place];  Service: Orthopedics;  Laterality: Right;   ORIF HUMERUS FRACTURE Right 11/17/2017   Procedure: OPEN REDUCTION INTERNAL FIXATION (ORIF) DISTAL HUMERUS FRACTURE;  Surgeon: Shona Needles, MD;  Location: [place];  Service: Orthopedics;  Laterality: Right;   Right inguinal hernia repair         Family History  Problem Relation Age of Onset   Cirrhosis Father        Deceased, ETOH cirrhosis   Alcoholism Father    Depression Brother    Stomach cancer Other        Aunt    Social History   Tobacco Use   Smoking status: Every Day    Packs/day: 0.25    Years: 30.00    Pack years: 7.50    Types: Cigarettes   Smokeless tobacco: Never   Tobacco comments:    smokes 1/2 pack per day 04/08/2020  Vaping Use   Vaping Use: Never used  Substance Use Topics   Alcohol use: No    Alcohol/week: 1.0 standard drink    Types: 1 Standard drinks or equivalent per week    Comment: Occasional   Drug use: No    Home Medications Prior to Admission medications   Medication Sig Start Date End Date Taking? Authorizing Provider  acetaminophen (TYLENOL) 500 MG tablet Take 500-1,000 mg by mouth every 6 (six) hours as needed (for pain or headaches).   Yes [provider]  albuterol (PROVENTIL) (2.5 MG/3ML) 0.083% nebulizer solution Take 3 mLs (2.5 mg total) by nebulization every 4 (four) hours as needed for wheezing or shortness of breath. 04/14/20  Yes Kathie Dike, MD  albuterol  (VENTOLIN HFA) 108 (90 Base) MCG/ACT inhaler Inhale 2 puffs into the lungs 4 (four) times daily as needed. Patient taking differently: Inhale 2 puffs into the lungs 4 (four) times daily as needed for wheezing or shortness of breath. 09/23/19  Yes Chesley Mires, MD  bacitracin 500 UNIT/GM ointment Apply 1 application topically as needed for wound care. 05/05/20  Yes [provider]  docusate sodium (COLACE) 100 MG capsule Take 100 mg by mouth 2 (two) times daily.   Yes [provider]  doxycycline (VIBRAMYCIN) 100 MG capsule Take 1 capsule (100 mg total) by mouth 2 (two) times daily. One po bid x 7 days 07/29/20  Yes Milton Ferguson, MD  gabapentin (NEURONTIN) 300 MG capsule Take 1 capsule (300 mg total) by mouth 3 (three) times daily. 03/17/20  Yes Lindell Spar, MD  LORazepam (ATIVAN) 2 MG tablet Take 2 mg by mouth every 6 (six) hours as needed. 04/23/20  Yes [provider]  morphine (MS CONTIN) 15 MG 12 hr tablet Take 1 tablet (15 mg total) by mouth every 12 (twelve) hours. 05/06/20  Yes Lindell Spar, MD  oxyCODONE-acetaminophen (PERCOCET) 10-325 MG tablet Take 1 tablet by mouth every 6 (six) hours as needed. 07/28/20  Yes [provider]  senna-docusate (SENOKOT-S) 8.6-50 MG tablet Take 2 tablets by mouth at bedtime. 05/22/20  Yes Barb Merino, MD  ALPRAZolam Duanne Moron) 0.5 MG tablet Take 1 tablet (0.5 mg total) by mouth 3 (three) times daily as needed for anxiety. Patient not taking: No sig reported 04/14/20   Kathie Dike, MD  enoxaparin (LOVENOX) 40 MG/0.4ML injection Inject 0.4 mLs (40 mg total) into the skin daily for 21 days. 05/23/20 06/13/20  Barb Merino, MD  Morphine Sulfate (MORPHINE CONCENTRATE) 10 MG/0.5ML SOLN concentrated solution Take 0.25 mLs (5 mg total) by mouth every 2 (two) hours as needed for moderate pain, anxiety or shortness of breath (breathlessness). Patient not taking: No sig reported 04/14/20   Kathie Dike, MD  naloxone [facility]) 2 MG/2ML  injection For suspected opioid overdose, spray 1 mL in each nostril.  Repeat after 3 minutes if no or minimal response. Patient not taking: No sig reported 05/04/20   [provider]  traZODone (DESYREL) 50 MG tablet Take 1 tablet (50 mg total) by mouth at bedtime. Patient not taking: No sig reported 02/20/20   Lindell Spar, MD    Allergies    Codeine and Hydrocodone  Review of Systems   Review of Systems  Constitutional:  Negative for appetite change and fatigue.  HENT:  Negative for congestion, ear discharge and sinus pressure.   Eyes:  Negative for discharge.  Respiratory:  Positive for shortness of breath and wheezing. Negative for cough.   Cardiovascular:  Negative for chest pain.  Gastrointestinal:  Negative for abdominal pain and diarrhea.  Genitourinary:  Negative for frequency and hematuria.  Musculoskeletal:  Negative for back pain.  Skin:  Negative for rash.  Neurological:  Negative for seizures and headaches.  Psychiatric/Behavioral:  Negative for hallucinations.    Physical Exam Updated Vital Signs BP 102/73   Pulse 96   Temp 98.3 F (36.8 C) (Oral)   Resp (!) 22   Ht 5\' 8"  (1.727 m)   Wt 55.8 kg   SpO2 100%   BMI 18.70 kg/m   Physical Exam Vitals reviewed.  Constitutional:      Appearance: Normal appearance. He is well-developed.  HENT:     Head: Normocephalic.     Nose: Nose normal.  Eyes:     General: No scleral icterus.    Conjunctiva/sclera: Conjunctivae normal.  Neck:     Thyroid: No thyromegaly.  Cardiovascular:     Rate and Rhythm: Normal rate and regular rhythm.     Heart sounds: No murmur heard.   No friction rub. No gallop.  Pulmonary:     Breath sounds: No stridor. Wheezing present. No rales.  Chest:     Chest wall: No tenderness.  Abdominal:     General: There is no distension.     Tenderness: There is no abdominal tenderness. There is no rebound.  Musculoskeletal:        General: Normal range of motion.     Cervical  back: Neck supple.  Lymphadenopathy:     Cervical: No cervical adenopathy.  Skin:    Findings:  No erythema or rash.  Neurological:     Mental Status: He is alert and oriented to person, place, and time.     Motor: No abnormal muscle tone.     Coordination: Coordination normal.  Psychiatric:        Behavior: Behavior normal.    ED Results / Procedures / Treatments   Labs (all labs ordered are listed, but only abnormal results are displayed) Labs Reviewed  CBC WITH DIFFERENTIAL/PLATELET - Abnormal; Notable for the following components:      Result Value   WBC 18.8 (*)    MCHC 28.9 (*)    Neutro Abs 15.2 (*)    Monocytes Absolute 1.6 (*)    Abs Immature Granulocytes 0.14 (*)    All other components within normal limits  BASIC METABOLIC PANEL - Abnormal; Notable for the following components:   Chloride 87 (*)    CO2 43 (*)    Glucose, Bld 133 (*)    Creatinine, Ser 0.53 (*)    All other components within normal limits  POC SARS CORONAVIRUS 2 AG -  ED    EKG None  Radiology DG Chest Port 1 View  Result Date: 07/29/2020 CLINICAL DATA:  Shortness of breath, increased EXAM: PORTABLE CHEST 1 VIEW COMPARISON:  09/10/2020 FINDINGS: Chronic interstitial coarsening and hyperinflation with accentuated markings at the left base compared to prior. At the time of image acquisition there is gaseous distension of the esophagus. No noted gastric bubble distension. Osteopenia.  Remote right humeral neck fracture with malalignment. IMPRESSION: 1. Left lower lobe pneumonia. Followup PA and lateral chest X-ray is recommended in 3-4 weeks following trial of antibiotic therapy to ensure resolution. 2. COPD. Electronically Signed   By: Monte Fantasia M.D.   On: 07/29/2020 11:22    Procedures Procedures   Medications Ordered in ED Medications  doxycycline (VIBRA-TABS) tablet 100 mg (has no administration in time range)  ipratropium-albuterol (DUONEB) 0.5-2.5 (3) MG/3ML nebulizer solution 3 mL (3  mLs Nebulization Given 07/29/20 1129)  albuterol (PROVENTIL) (2.5 MG/3ML) 0.083% nebulizer solution 2.5 mg (2.5 mg Nebulization Given 07/29/20 1128)    ED Course  I have reviewed the triage vital signs and the nursing notes.  Pertinent labs & imaging results that were available during my care of the patient were reviewed by me and considered in my medical decision making (see chart for details). Patient with COPD exacerbation and in the hospital who is comfort care but antibiotics and fluids if necessary.  Patient was given albuterol and that helped his wheezing.  Chest x-ray suggest possible pneumonia.  Patient will be placed on doxycycline.  His dyspnea is back to normal and his O2 sats are above 95 on his normal 3 L.  He will be discharged home   MDM Rules/Calculators/A&P                          COPD exacerbation with possible pneumonia.  Patient placed on doxycycline Final Clinical Impression(s) / ED Diagnoses Final diagnoses:  COPD exacerbation ([place])    Rx / DC Orders ED Discharge Orders          Ordered    doxycycline (VIBRAMYCIN) 100 MG capsule  2 times daily        07/29/20 1508             Milton Ferguson, MD 07/29/20 1512

## 2020-07-29 NOTE — ED Notes (Signed)
Respiratory therapist aware of neb tx and POC test.

## 2020-07-29 NOTE — ED Notes (Signed)
Hospice nurse called for update.

## 2020-07-29 NOTE — ED Triage Notes (Signed)
Pt arrived by [facility] with pt complaining of SOB. Pt normally 3 lpm at home and other had to increase to 5 lpm and then called EMS. Pt has yellow sputum reported. EMS gave neb tx. MOST form and DNR at bedside.

## 2020-07-29 NOTE — ED Notes (Signed)
Dr at bedside at triage

## 2020-08-06 DIAGNOSIS — T300 Burn of unspecified body region, unspecified degree: Secondary | ICD-10-CM | POA: Diagnosis not present

## 2020-08-06 DIAGNOSIS — Z9981 Dependence on supplemental oxygen: Secondary | ICD-10-CM | POA: Diagnosis not present

## 2020-08-06 DIAGNOSIS — T23251A Burn of second degree of right palm, initial encounter: Secondary | ICD-10-CM | POA: Diagnosis not present

## 2020-08-06 DIAGNOSIS — R0689 Other abnormalities of breathing: Secondary | ICD-10-CM | POA: Diagnosis not present

## 2020-08-06 DIAGNOSIS — T2030XA Burn of third degree of head, face, and neck, unspecified site, initial encounter: Secondary | ICD-10-CM | POA: Diagnosis not present

## 2020-08-06 DIAGNOSIS — J441 Chronic obstructive pulmonary disease with (acute) exacerbation: Secondary | ICD-10-CM | POA: Diagnosis not present

## 2020-08-06 DIAGNOSIS — R0902 Hypoxemia: Secondary | ICD-10-CM | POA: Diagnosis not present

## 2020-08-06 DIAGNOSIS — S069X9A Unspecified intracranial injury with loss of consciousness of unspecified duration, initial encounter: Secondary | ICD-10-CM | POA: Diagnosis not present

## 2020-08-06 DIAGNOSIS — W401XXA Explosion of explosive gases, initial encounter: Secondary | ICD-10-CM | POA: Diagnosis not present

## 2020-08-06 DIAGNOSIS — T310 Burns involving less than 10% of body surface: Secondary | ICD-10-CM | POA: Diagnosis not present

## 2020-08-06 DIAGNOSIS — R6510 Systemic inflammatory response syndrome (SIRS) of non-infectious origin without acute organ dysfunction: Secondary | ICD-10-CM | POA: Diagnosis not present

## 2020-08-06 DIAGNOSIS — G7281 Critical illness myopathy: Secondary | ICD-10-CM | POA: Diagnosis not present

## 2020-08-06 DIAGNOSIS — T2024XA Burn of second degree of nose (septum), initial encounter: Secondary | ICD-10-CM | POA: Diagnosis not present

## 2020-08-06 DIAGNOSIS — X04XXXA Exposure to ignition of highly flammable material, initial encounter: Secondary | ICD-10-CM | POA: Diagnosis not present

## 2020-08-06 DIAGNOSIS — E440 Moderate protein-calorie malnutrition: Secondary | ICD-10-CM | POA: Diagnosis not present

## 2020-08-06 DIAGNOSIS — T23272A Burn of second degree of left wrist, initial encounter: Secondary | ICD-10-CM | POA: Diagnosis not present

## 2020-08-06 DIAGNOSIS — J9621 Acute and chronic respiratory failure with hypoxia: Secondary | ICD-10-CM | POA: Diagnosis not present

## 2020-08-06 DIAGNOSIS — R52 Pain, unspecified: Secondary | ICD-10-CM | POA: Diagnosis not present

## 2020-08-06 DIAGNOSIS — E46 Unspecified protein-calorie malnutrition: Secondary | ICD-10-CM | POA: Diagnosis not present

## 2020-08-06 DIAGNOSIS — T2000XA Burn of unspecified degree of head, face, and neck, unspecified site, initial encounter: Secondary | ICD-10-CM | POA: Diagnosis not present

## 2020-08-06 DIAGNOSIS — J9622 Acute and chronic respiratory failure with hypercapnia: Secondary | ICD-10-CM | POA: Diagnosis not present

## 2020-08-09 DIAGNOSIS — T2030XA Burn of third degree of head, face, and neck, unspecified site, initial encounter: Secondary | ICD-10-CM | POA: Diagnosis not present

## 2020-08-09 DIAGNOSIS — T310 Burns involving less than 10% of body surface: Secondary | ICD-10-CM | POA: Diagnosis not present

## 2020-08-09 DIAGNOSIS — J9622 Acute and chronic respiratory failure with hypercapnia: Secondary | ICD-10-CM | POA: Diagnosis not present

## 2020-08-09 DIAGNOSIS — T23272A Burn of second degree of left wrist, initial encounter: Secondary | ICD-10-CM | POA: Diagnosis not present

## 2020-08-09 DIAGNOSIS — J9621 Acute and chronic respiratory failure with hypoxia: Secondary | ICD-10-CM | POA: Diagnosis not present

## 2020-08-09 DIAGNOSIS — T23251A Burn of second degree of right palm, initial encounter: Secondary | ICD-10-CM | POA: Diagnosis not present

## 2020-08-09 DIAGNOSIS — Z9981 Dependence on supplemental oxygen: Secondary | ICD-10-CM | POA: Diagnosis not present

## 2020-08-13 DIAGNOSIS — T2020XA Burn of second degree of head, face, and neck, unspecified site, initial encounter: Secondary | ICD-10-CM | POA: Diagnosis not present

## 2020-08-13 DIAGNOSIS — T23272A Burn of second degree of left wrist, initial encounter: Secondary | ICD-10-CM | POA: Diagnosis not present

## 2020-08-13 DIAGNOSIS — T310 Burns involving less than 10% of body surface: Secondary | ICD-10-CM | POA: Diagnosis not present

## 2020-08-13 DIAGNOSIS — T23201A Burn of second degree of right hand, unspecified site, initial encounter: Secondary | ICD-10-CM | POA: Diagnosis not present

## 2020-08-20 DIAGNOSIS — T23292A Burn of second degree of multiple sites of left wrist and hand, initial encounter: Secondary | ICD-10-CM | POA: Diagnosis not present

## 2020-08-20 DIAGNOSIS — T23251A Burn of second degree of right palm, initial encounter: Secondary | ICD-10-CM | POA: Diagnosis not present

## 2020-08-20 DIAGNOSIS — T2020XA Burn of second degree of head, face, and neck, unspecified site, initial encounter: Secondary | ICD-10-CM | POA: Diagnosis not present

## 2020-10-08 DEATH — deceased
